# Patient Record
Sex: Female | Born: 1935 | Race: White | Hispanic: No | Smoking: Never smoker
Health system: Southern US, Community
[De-identification: ages and names within clinical notes are randomized; demographics above are authoritative.]

## PROBLEM LIST (undated history)

## (undated) DIAGNOSIS — F0390 Unspecified dementia without behavioral disturbance: Secondary | ICD-10-CM

## (undated) DIAGNOSIS — F32A Depression, unspecified: Secondary | ICD-10-CM

## (undated) DIAGNOSIS — G459 Transient cerebral ischemic attack, unspecified: Secondary | ICD-10-CM

## (undated) DIAGNOSIS — R1084 Generalized abdominal pain: Secondary | ICD-10-CM

## (undated) DIAGNOSIS — Z9221 Personal history of antineoplastic chemotherapy: Secondary | ICD-10-CM

## (undated) DIAGNOSIS — K222 Esophageal obstruction: Secondary | ICD-10-CM

## (undated) DIAGNOSIS — M1990 Unspecified osteoarthritis, unspecified site: Secondary | ICD-10-CM

## (undated) DIAGNOSIS — E039 Hypothyroidism, unspecified: Secondary | ICD-10-CM

## (undated) DIAGNOSIS — R600 Localized edema: Secondary | ICD-10-CM

## (undated) DIAGNOSIS — K5790 Diverticulosis of intestine, part unspecified, without perforation or abscess without bleeding: Secondary | ICD-10-CM

## (undated) DIAGNOSIS — IMO0001 Reserved for inherently not codable concepts without codable children: Secondary | ICD-10-CM

## (undated) DIAGNOSIS — C801 Malignant (primary) neoplasm, unspecified: Secondary | ICD-10-CM

## (undated) DIAGNOSIS — IMO0002 Reserved for concepts with insufficient information to code with codable children: Secondary | ICD-10-CM

## (undated) DIAGNOSIS — I10 Essential (primary) hypertension: Secondary | ICD-10-CM

## (undated) DIAGNOSIS — F329 Major depressive disorder, single episode, unspecified: Secondary | ICD-10-CM

## (undated) HISTORY — DX: Generalized abdominal pain: R10.84

## (undated) HISTORY — DX: Reserved for inherently not codable concepts without codable children: IMO0001

## (undated) HISTORY — DX: Transient cerebral ischemic attack, unspecified: G45.9

## (undated) HISTORY — DX: Personal history of antineoplastic chemotherapy: Z92.21

## (undated) HISTORY — DX: Esophageal obstruction: K22.2

## (undated) HISTORY — DX: Reserved for concepts with insufficient information to code with codable children: IMO0002

## (undated) HISTORY — DX: Depression, unspecified: F32.A

## (undated) HISTORY — DX: Diverticulosis of intestine, part unspecified, without perforation or abscess without bleeding: K57.90

## (undated) HISTORY — DX: Unspecified osteoarthritis, unspecified site: M19.90

## (undated) HISTORY — DX: Essential (primary) hypertension: I10

## (undated) HISTORY — DX: Malignant (primary) neoplasm, unspecified: C80.1

## (undated) HISTORY — PX: TUBAL LIGATION: SHX77

## (undated) HISTORY — PX: CATARACT EXTRACTION, BILATERAL: SHX1313

## (undated) HISTORY — DX: Major depressive disorder, single episode, unspecified: F32.9

## (undated) HISTORY — DX: Hypothyroidism, unspecified: E03.9

## (undated) HISTORY — DX: Localized edema: R60.0

---

## 1984-07-16 HISTORY — PX: TOTAL ABDOMINAL HYSTERECTOMY W/ BILATERAL SALPINGOOPHORECTOMY: SHX83

## 2000-11-29 ENCOUNTER — Other Ambulatory Visit: Admission: RE | Admit: 2000-11-29 | Discharge: 2000-11-29 | Payer: Self-pay | Admitting: Family Medicine

## 2000-12-05 ENCOUNTER — Ambulatory Visit (HOSPITAL_COMMUNITY): Admission: RE | Admit: 2000-12-05 | Discharge: 2000-12-05 | Payer: Self-pay | Admitting: Family Medicine

## 2000-12-05 ENCOUNTER — Encounter: Payer: Self-pay | Admitting: Family Medicine

## 2001-09-06 ENCOUNTER — Emergency Department (HOSPITAL_COMMUNITY): Admission: EM | Admit: 2001-09-06 | Discharge: 2001-09-06 | Payer: Self-pay | Admitting: *Deleted

## 2001-12-23 ENCOUNTER — Ambulatory Visit (HOSPITAL_COMMUNITY): Admission: RE | Admit: 2001-12-23 | Discharge: 2001-12-23 | Payer: Self-pay | Admitting: Family Medicine

## 2001-12-23 ENCOUNTER — Encounter: Payer: Self-pay | Admitting: Family Medicine

## 2002-07-25 ENCOUNTER — Encounter: Payer: Self-pay | Admitting: Emergency Medicine

## 2002-07-25 ENCOUNTER — Emergency Department (HOSPITAL_COMMUNITY): Admission: EM | Admit: 2002-07-25 | Discharge: 2002-07-25 | Payer: Self-pay | Admitting: Emergency Medicine

## 2002-07-28 ENCOUNTER — Encounter: Payer: Self-pay | Admitting: *Deleted

## 2002-07-28 ENCOUNTER — Emergency Department (HOSPITAL_COMMUNITY): Admission: EM | Admit: 2002-07-28 | Discharge: 2002-07-28 | Payer: Self-pay | Admitting: *Deleted

## 2002-08-09 ENCOUNTER — Encounter: Payer: Self-pay | Admitting: *Deleted

## 2002-08-09 ENCOUNTER — Emergency Department (HOSPITAL_COMMUNITY): Admission: EM | Admit: 2002-08-09 | Discharge: 2002-08-09 | Payer: Self-pay | Admitting: *Deleted

## 2002-10-06 ENCOUNTER — Emergency Department (HOSPITAL_COMMUNITY): Admission: EM | Admit: 2002-10-06 | Discharge: 2002-10-06 | Payer: Self-pay | Admitting: Emergency Medicine

## 2003-01-26 ENCOUNTER — Ambulatory Visit (HOSPITAL_COMMUNITY): Admission: RE | Admit: 2003-01-26 | Discharge: 2003-01-26 | Payer: Self-pay | Admitting: Family Medicine

## 2003-01-26 ENCOUNTER — Encounter: Payer: Self-pay | Admitting: Family Medicine

## 2003-03-07 ENCOUNTER — Emergency Department (HOSPITAL_COMMUNITY): Admission: EM | Admit: 2003-03-07 | Discharge: 2003-03-07 | Payer: Self-pay | Admitting: Emergency Medicine

## 2004-01-08 ENCOUNTER — Emergency Department (HOSPITAL_COMMUNITY): Admission: EM | Admit: 2004-01-08 | Discharge: 2004-01-08 | Payer: Self-pay | Admitting: Emergency Medicine

## 2004-04-30 ENCOUNTER — Emergency Department (HOSPITAL_COMMUNITY): Admission: EM | Admit: 2004-04-30 | Discharge: 2004-04-30 | Payer: Self-pay | Admitting: Emergency Medicine

## 2004-07-03 ENCOUNTER — Emergency Department (HOSPITAL_COMMUNITY): Admission: EM | Admit: 2004-07-03 | Discharge: 2004-07-04 | Payer: Self-pay | Admitting: *Deleted

## 2004-12-30 ENCOUNTER — Emergency Department (HOSPITAL_COMMUNITY): Admission: EM | Admit: 2004-12-30 | Discharge: 2004-12-30 | Payer: Self-pay | Admitting: Emergency Medicine

## 2005-01-18 ENCOUNTER — Ambulatory Visit (HOSPITAL_COMMUNITY): Admission: RE | Admit: 2005-01-18 | Discharge: 2005-01-18 | Payer: Self-pay | Admitting: Family Medicine

## 2005-02-13 DIAGNOSIS — K5790 Diverticulosis of intestine, part unspecified, without perforation or abscess without bleeding: Secondary | ICD-10-CM

## 2005-02-13 HISTORY — DX: Diverticulosis of intestine, part unspecified, without perforation or abscess without bleeding: K57.90

## 2005-03-02 IMAGING — CT CT PELVIS W/ CM
1 of 3 series · 14 of 32 positions shown, 20 images · IV contrast (agent unspecified)
Comparison: none

CLINICAL DATA: Left lower abdominal pain. Previous hysterectomy.

CT abdomen with contrast:
Multidetector helical CT after 100 ml [SA] IV.
No previous for comparison. Visualized lung bases clear. Unremarkable liver,
gallbladder, spleen, adrenal glands, kidneys, pancreas, aorta, small bowel. No
free air. No ascites. Degenerative changes in the lumbar spine.

[Series 7473: — · axial · 0.73mm/px · z∈[+1366,+1746]mm · 14 of 88 slices shown, 20 images]
[im 6/88  soft-tissue]
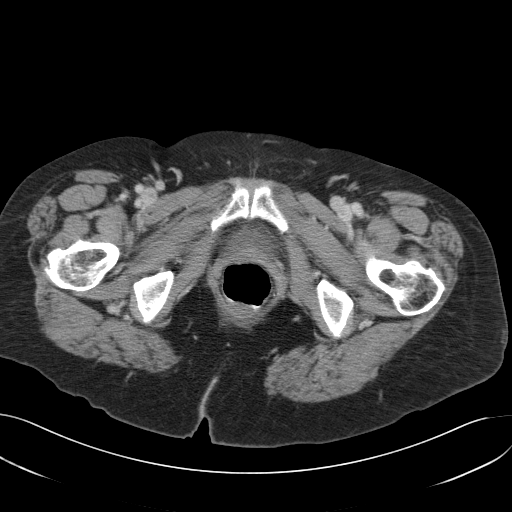
[im 6/88  bone]
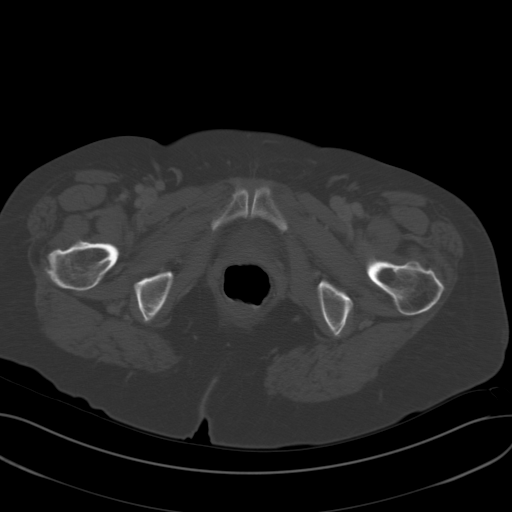
[im 11/88  soft-tissue]
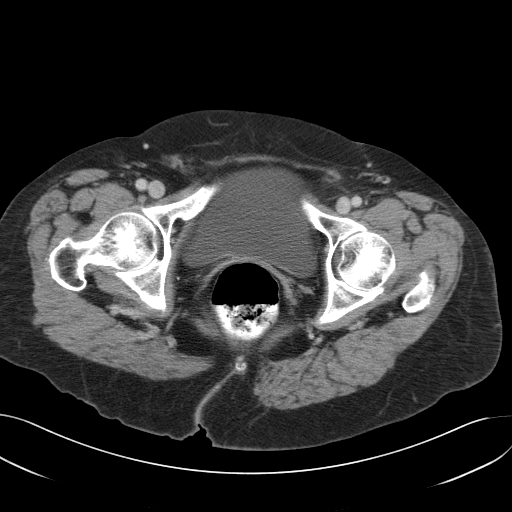
[im 16/88  soft-tissue]
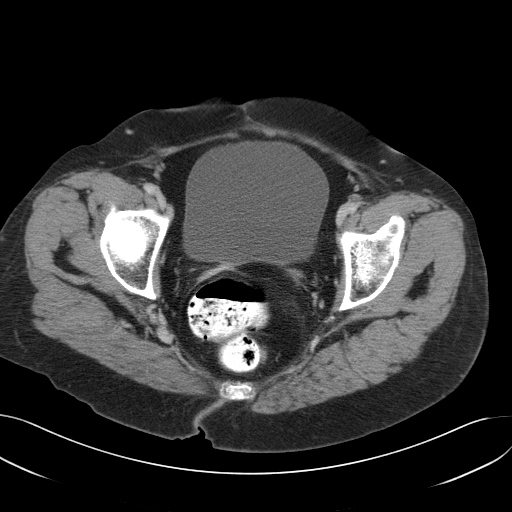
[im 26/88  soft-tissue]
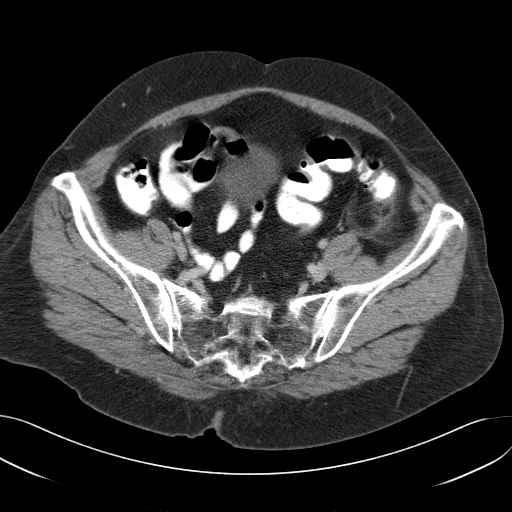
[im 31/88  soft-tissue]
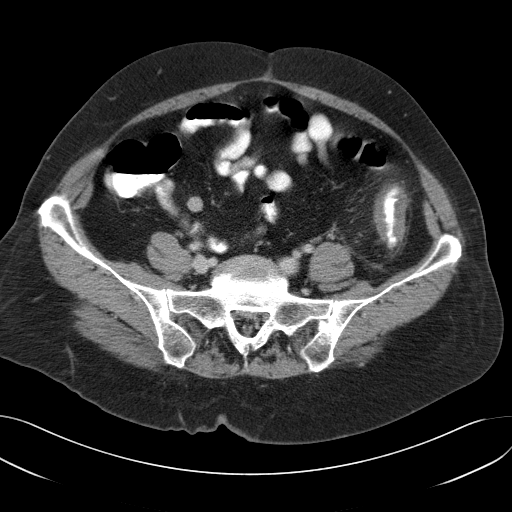
[im 36/88  soft-tissue]
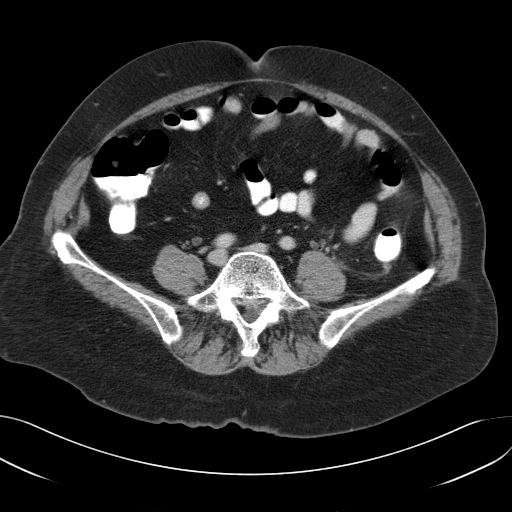
[im 41/88  soft-tissue]
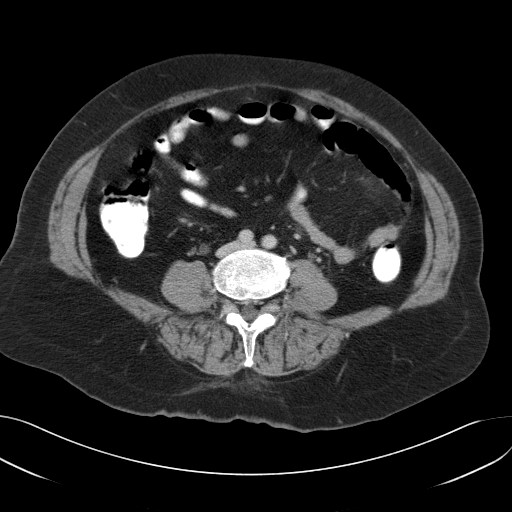
[im 47/88  soft-tissue]
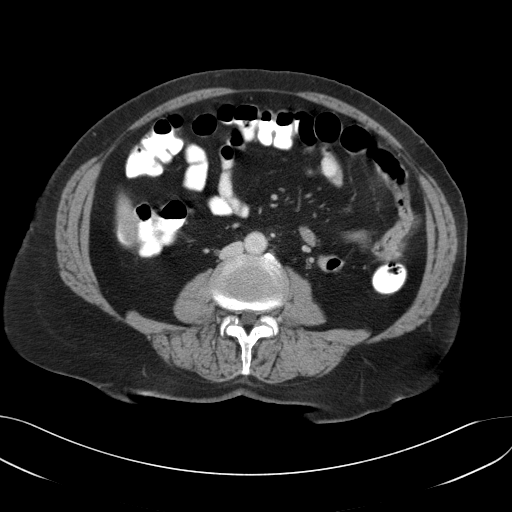
[im 52/88  soft-tissue]
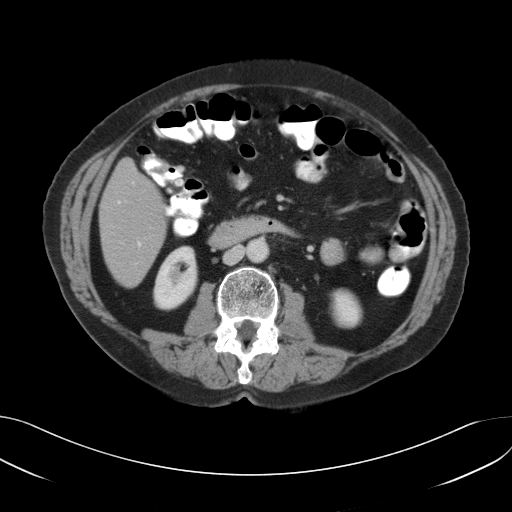
[im 52/88  bone]
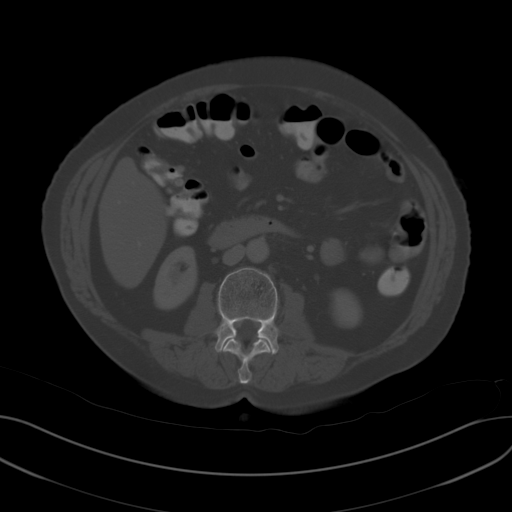
[im 57/88  soft-tissue]
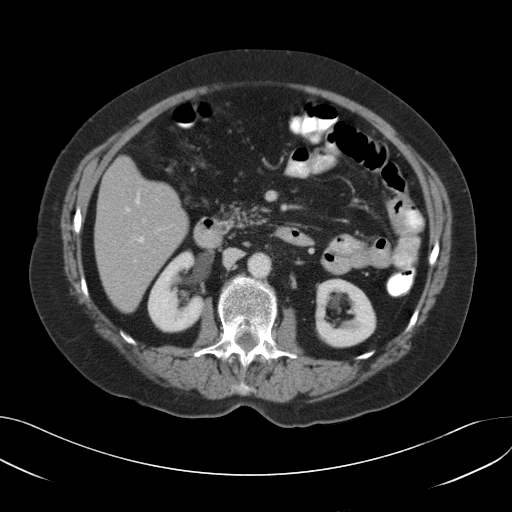
[im 67/88  soft-tissue]
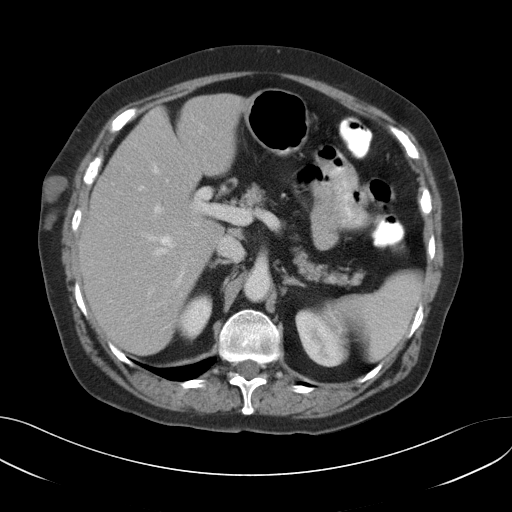
[im 67/88  lung]
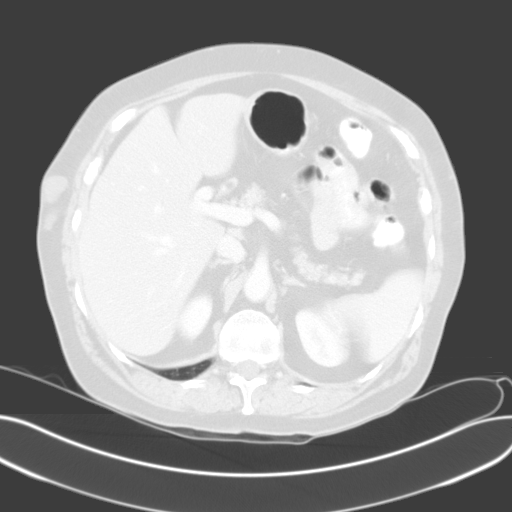
[im 72/88  soft-tissue]
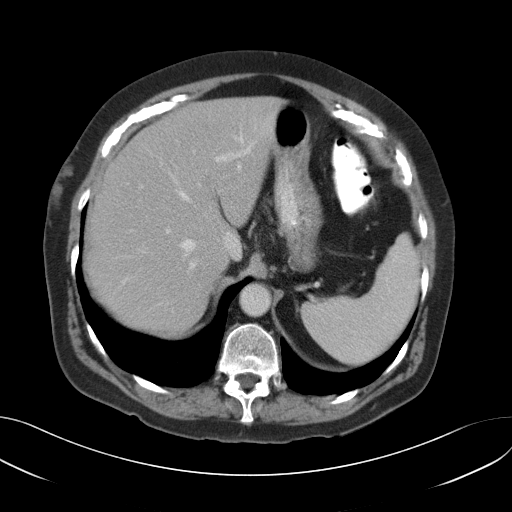
[im 72/88  lung]
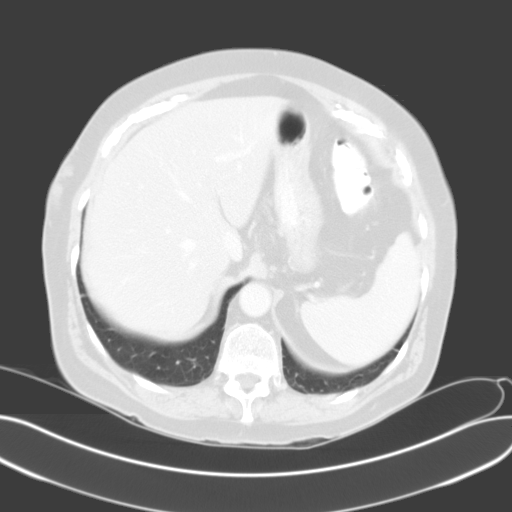
[im 77/88  soft-tissue]
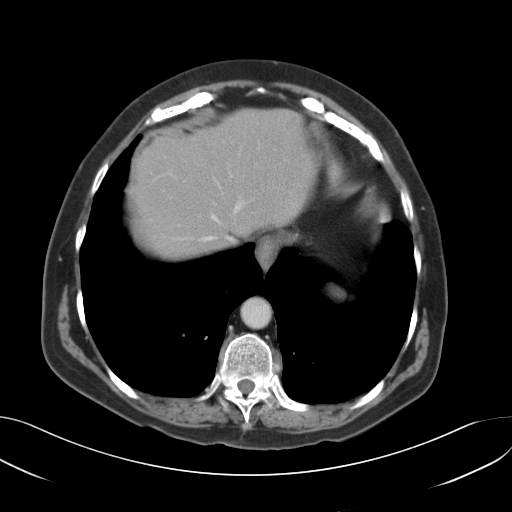
[im 77/88  lung]
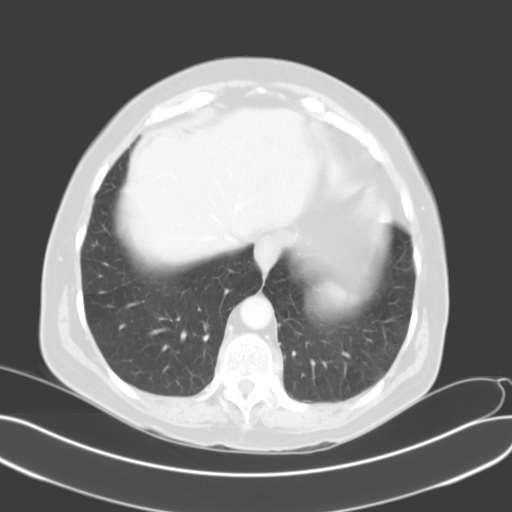
[im 82/88  soft-tissue]
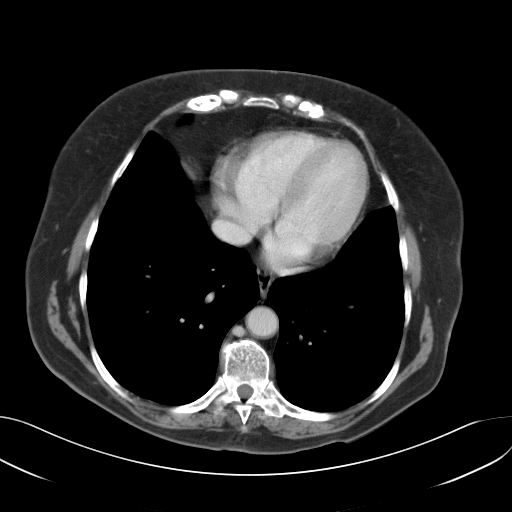
[im 82/88  lung]
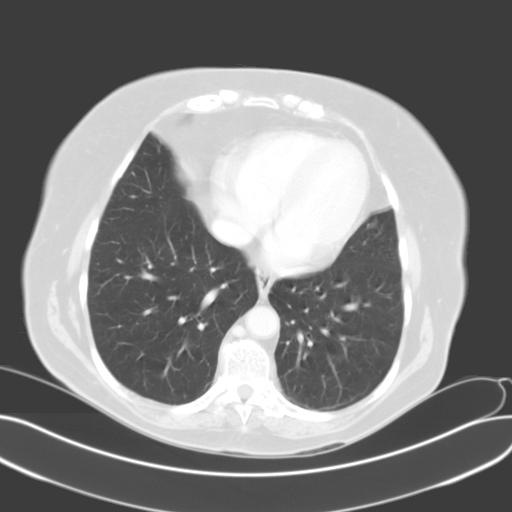

[14 of 32 positions shown; findings below may reference images not displayed]

IMPRESSION: 1. Unremarkable CT abdomen

CT pelvis with contrast: 

Streaky inflammatory/edematous changes around the distal descending colon.
Scattered diverticula are noted in the descending and sigmoid portion of the
colon. No extraluminal fluid collection or gas. No free fluid. Urinary bladder
physiologically distended. Appendix not seen.
IMPRESSION: 1. Probable distal descending colon diverticulitis without evidence of abscess.

## 2005-03-03 ENCOUNTER — Inpatient Hospital Stay (HOSPITAL_COMMUNITY): Admission: EM | Admit: 2005-03-03 | Discharge: 2005-03-06 | Payer: Self-pay | Admitting: Emergency Medicine

## 2005-03-03 ENCOUNTER — Ambulatory Visit: Payer: Self-pay | Admitting: Internal Medicine

## 2005-03-27 ENCOUNTER — Ambulatory Visit (HOSPITAL_COMMUNITY): Admission: RE | Admit: 2005-03-27 | Discharge: 2005-03-27 | Payer: Self-pay | Admitting: Otolaryngology

## 2005-03-27 ENCOUNTER — Encounter (INDEPENDENT_AMBULATORY_CARE_PROVIDER_SITE_OTHER): Payer: Self-pay | Admitting: *Deleted

## 2005-03-27 HISTORY — PX: DIRECT LARYNGOSCOPY: SHX5326

## 2005-04-03 ENCOUNTER — Ambulatory Visit (HOSPITAL_COMMUNITY): Admission: RE | Admit: 2005-04-03 | Discharge: 2005-04-03 | Payer: Self-pay | Admitting: Otolaryngology

## 2005-04-03 IMAGING — CT CT NECK W/ CM
1 series · 12 of 14 positions shown, 15 images · IV contrast (omnipaque)
Comparison: [REDACTED] two view chest x-ray [DATE].

CLINICAL DATA: Staging supraglottic cancer.  Throat biopsy [DATE].  Painful and difficulty swallowing for two to three months.  Hypertension. 
NECK CT WITH CONTRAST:
TECHNIQUE: Multidetector CT imaging of the neck was performed following the standard protocol during administration of intravenous contrast.
Contrast:  100 cc Omnipaque 300

[Series 284: — · axial · 0.39mm/px · z∈[-855,-660]mm · 12 of 73 slices shown, 15 images]
[im 6/73  soft-tissue]
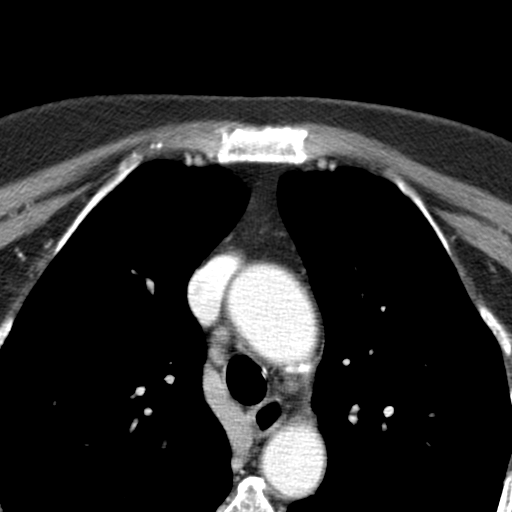
[im 6/73  bone]
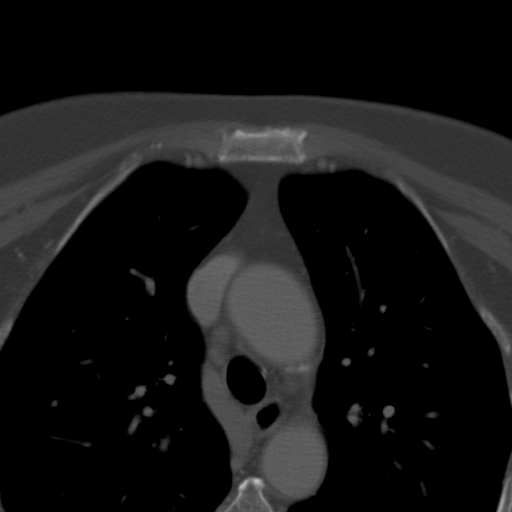
[im 12/73  bone]
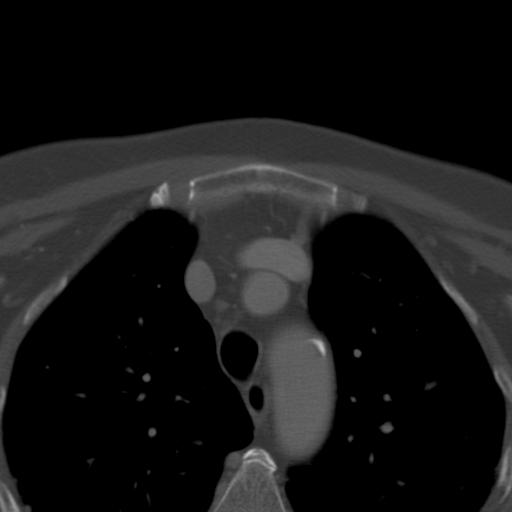
[im 17/73  bone]
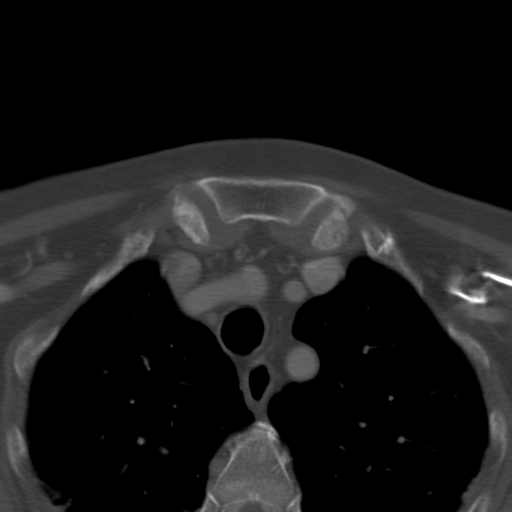
[im 23/73  bone]
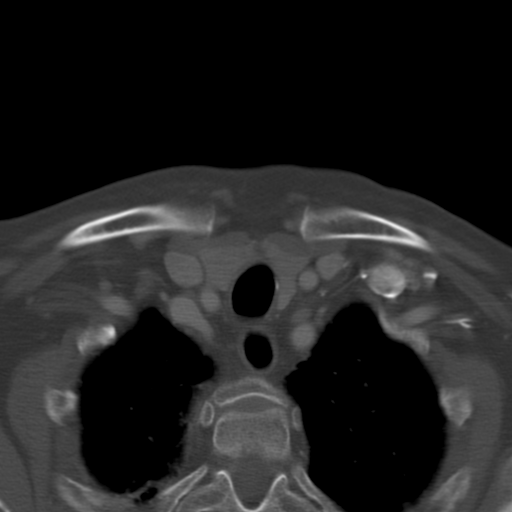
[im 28/73  soft-tissue]
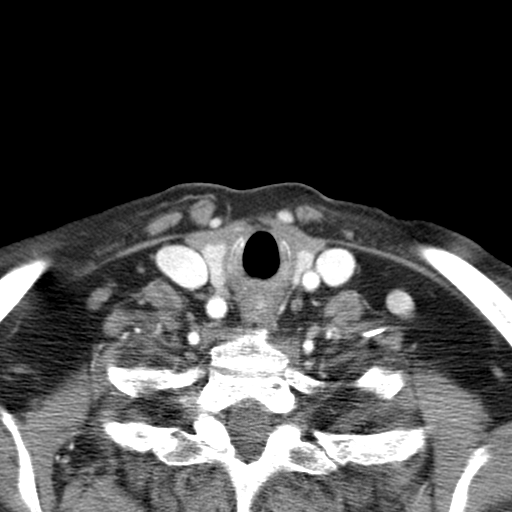
[im 28/73  bone]
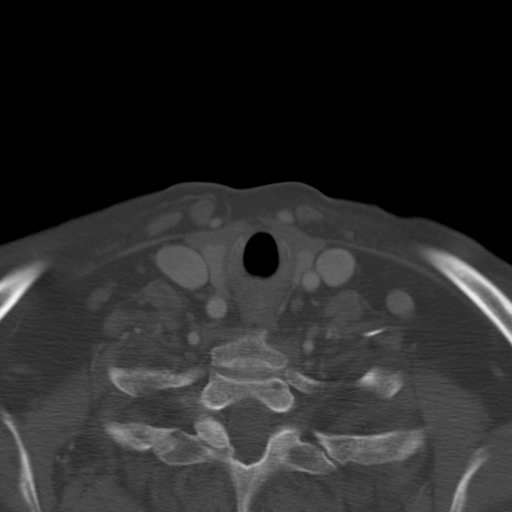
[im 34/73  bone]
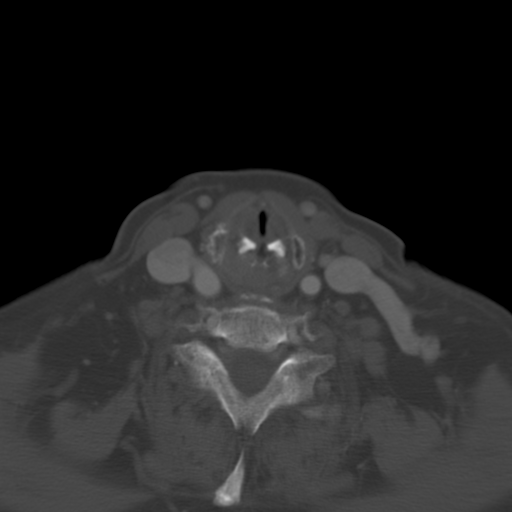
[im 39/73  bone]
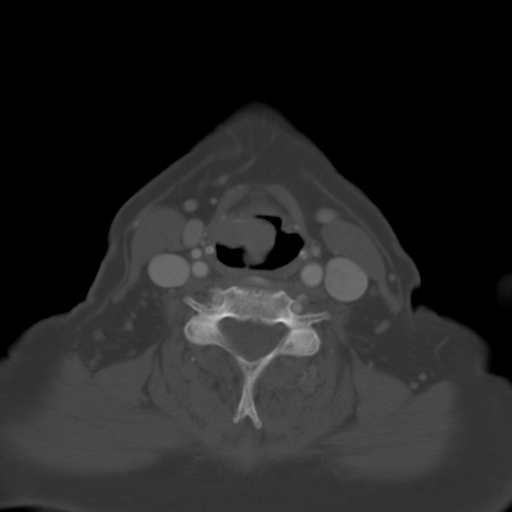
[im 45/73  bone]
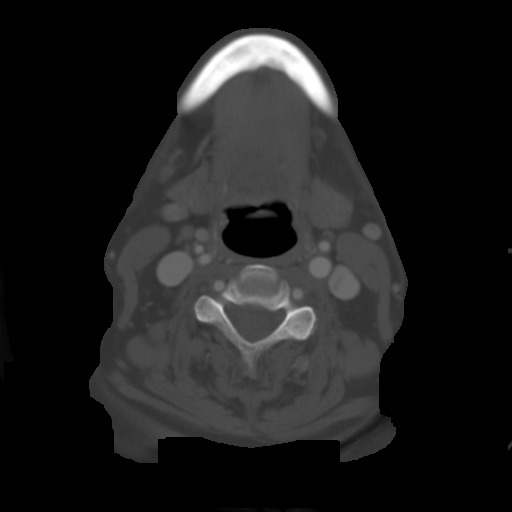
[im 50/73  soft-tissue]
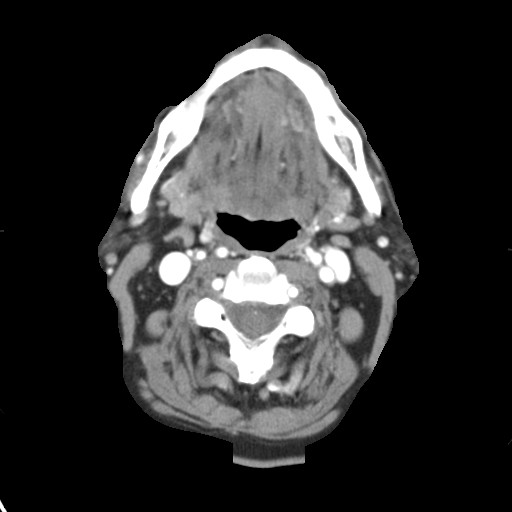
[im 50/73  bone]
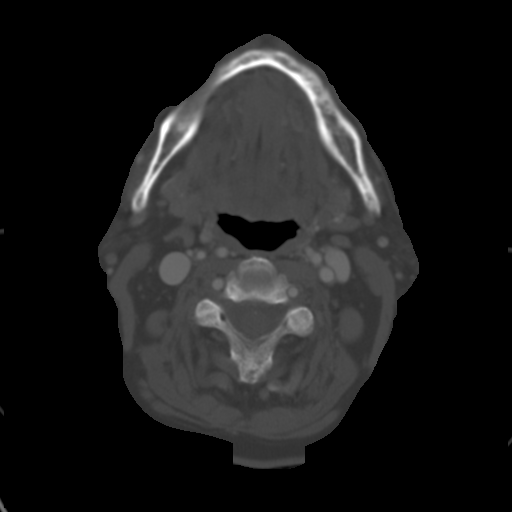
[im 56/73  bone]
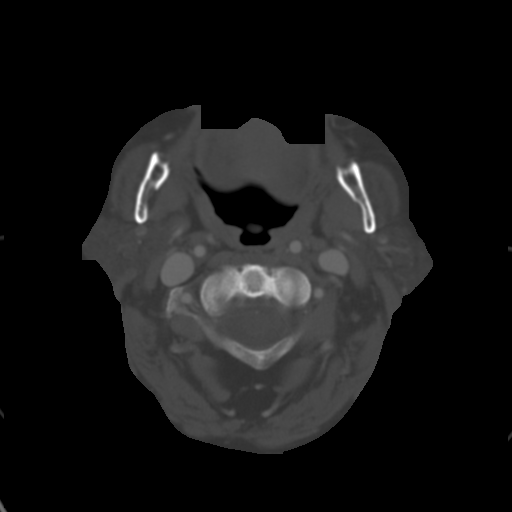
[im 61/73  bone]
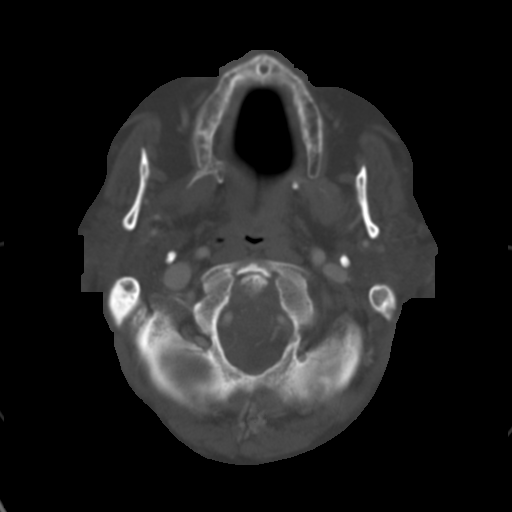
[im 67/73  bone]
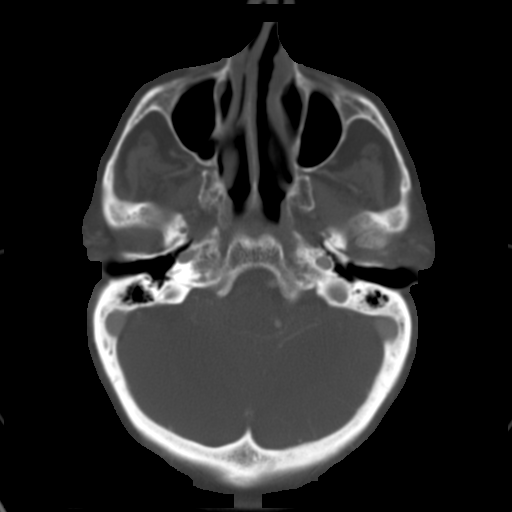

[12 of 14 positions shown; findings below may reference images not displayed]

FINDINGS: Abnormal soft tissue mass is seen from the level of the right false vocal cord continuing along the right aryepiglottic fold measuring approximately 1.5 cm AP x 2.7 cm wide (image 37).  No right paralaryngeal nor preepiglottic space invasion is seen.  The true vocal cords appear normal with no subglottic spread of tumor.  No metastatic cervical adenopathy is seen.  Naso-oro-remaining hypopharynx, thyroid gland, salivary glands appear normal.  Developmental decreased left greater than right mastoid pneumatization is seen with no significant osseous abnormality.  No change in slight right apical fibrosis with the lung apices otherwise clear and slight indeterminate superior mediastinal lymphadenopathy with the largest right paratracheal lymph node measuring 1.3 cm AP x 0.9 cm wide (image 69) which is nonspecific.  No other significant abnormality is seen.
IMPRESSION: 1.  Tumor at the right false vocal cord/aryepiglottic fold with no other direct invasion nor metastatic disease seen.  
2.  Slight superior mediastinal adenopathy. 
3.  Otherwise no significant abnormality.

## 2005-04-16 ENCOUNTER — Ambulatory Visit: Payer: Self-pay | Admitting: Dentistry

## 2005-04-16 ENCOUNTER — Encounter: Admission: RE | Admit: 2005-04-16 | Discharge: 2005-04-16 | Payer: Self-pay | Admitting: Dentistry

## 2005-04-23 ENCOUNTER — Ambulatory Visit: Admission: RE | Admit: 2005-04-23 | Discharge: 2005-07-10 | Payer: Self-pay | Admitting: Radiation Oncology

## 2005-07-14 ENCOUNTER — Observation Stay (HOSPITAL_COMMUNITY): Admission: EM | Admit: 2005-07-14 | Discharge: 2005-07-16 | Payer: Self-pay | Admitting: Emergency Medicine

## 2005-08-06 ENCOUNTER — Ambulatory Visit: Payer: Self-pay | Admitting: Dentistry

## 2005-10-10 ENCOUNTER — Ambulatory Visit: Payer: Self-pay | Admitting: Dentistry

## 2005-10-23 IMAGING — CR DG CHEST 2V
2 series · 2 of 2 positions shown · non-contrast
Comparison: None.

CLINICAL DATA: Sore throat, productive cough for six weeks
 CHEST ? TWO VIEWS:

[view not recorded (1 of 2)]
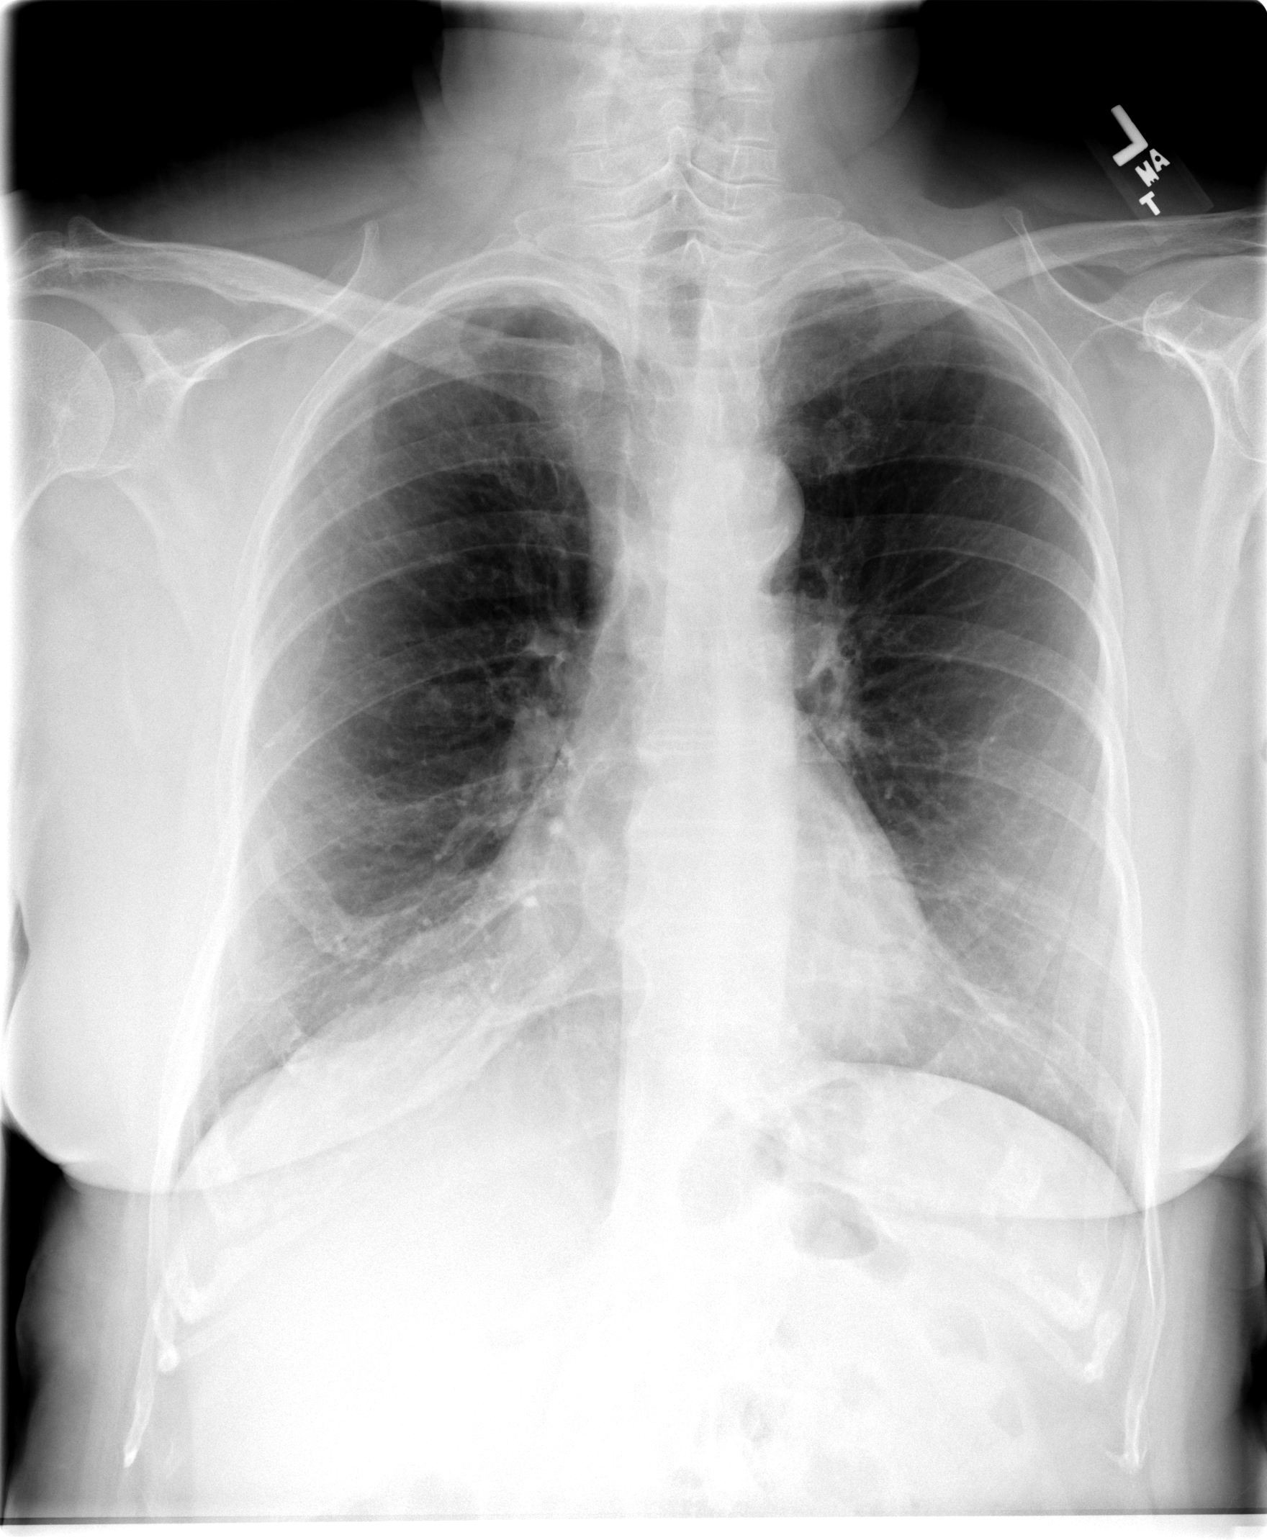

[view not recorded (2 of 2)]
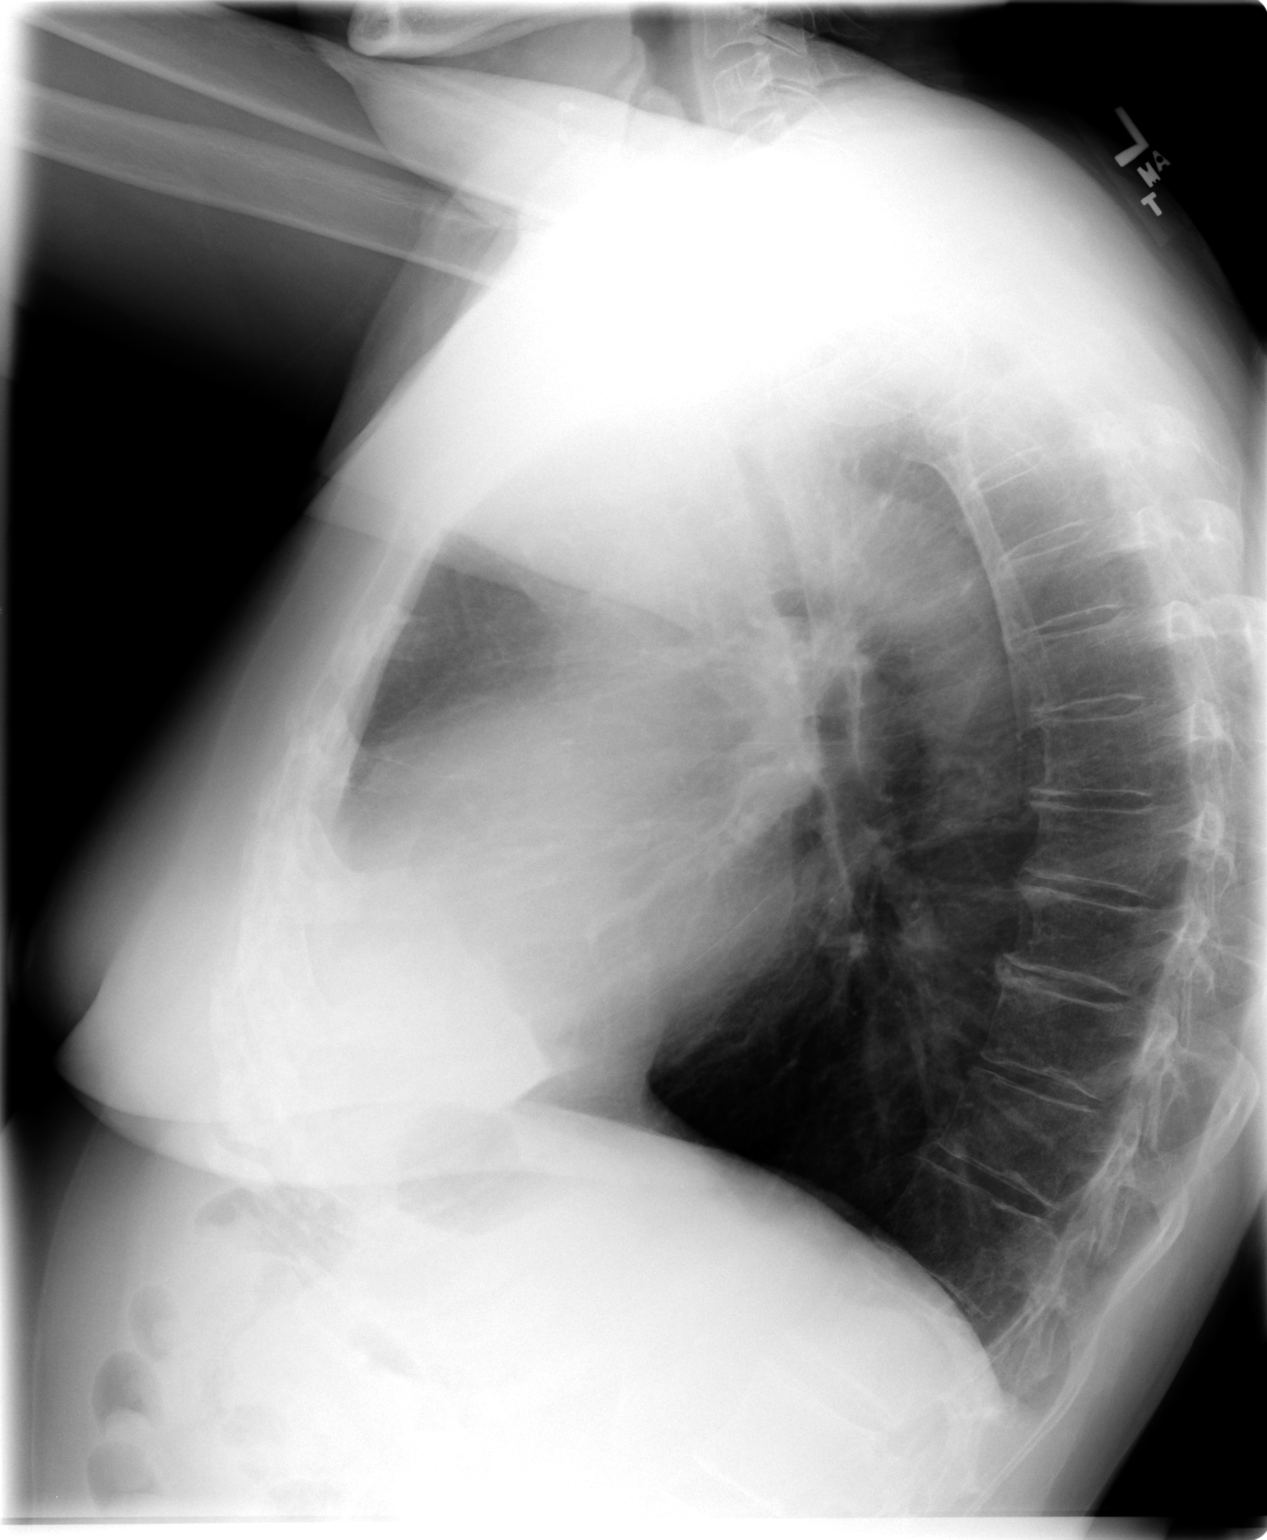

[2 of 2 positions shown; findings below may reference images not displayed]

FINDINGS: The heart is normal.  
 No pleural effusions.  
 No focal lung opacities are identified.
IMPRESSION: No active cardiopulmonary abnormalities.

## 2005-11-21 ENCOUNTER — Ambulatory Visit: Payer: Self-pay | Admitting: Dentistry

## 2005-11-26 ENCOUNTER — Ambulatory Visit: Payer: Self-pay | Admitting: Dentistry

## 2005-12-14 ENCOUNTER — Ambulatory Visit (HOSPITAL_COMMUNITY): Admission: RE | Admit: 2005-12-14 | Discharge: 2005-12-14 | Payer: Self-pay | Admitting: Family Medicine

## 2005-12-14 IMAGING — CR DG CERVICAL SPINE COMPLETE 4+V
6 series · 6 of 6 positions shown · non-contrast
Comparison: none

HISTORY: Posterior neck pain, throat cancer

[view not recorded (1 of 6)]
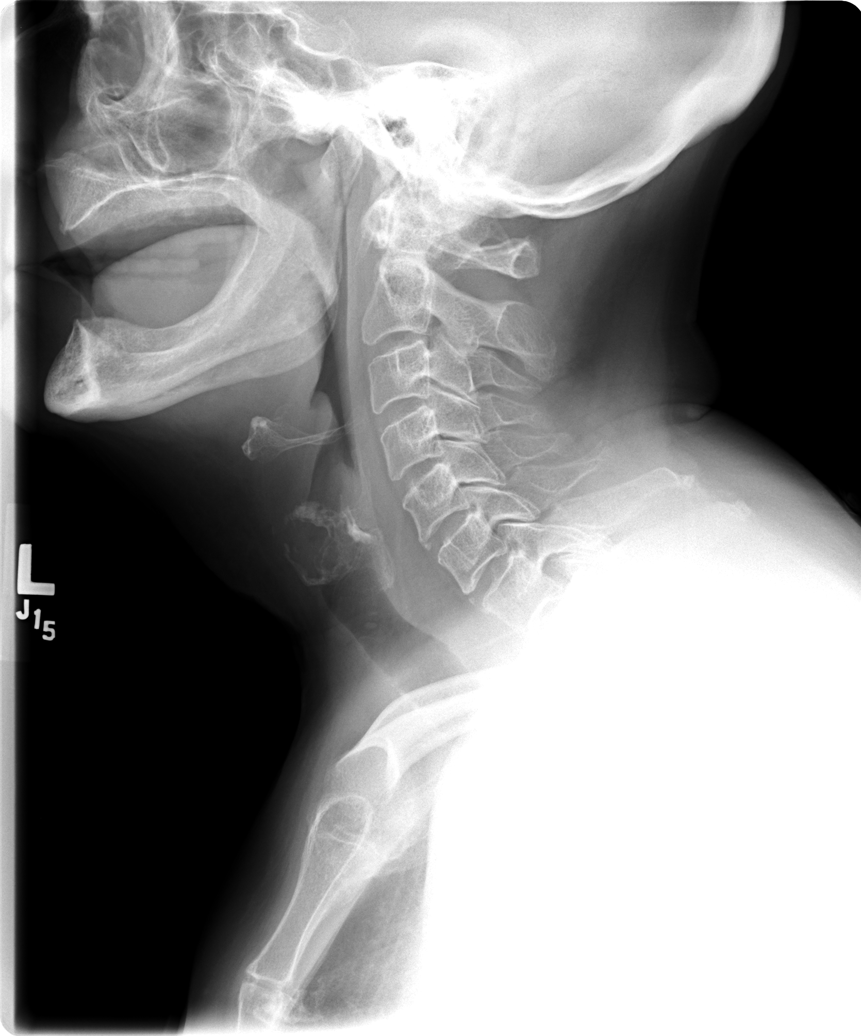

[view not recorded (2 of 6)]
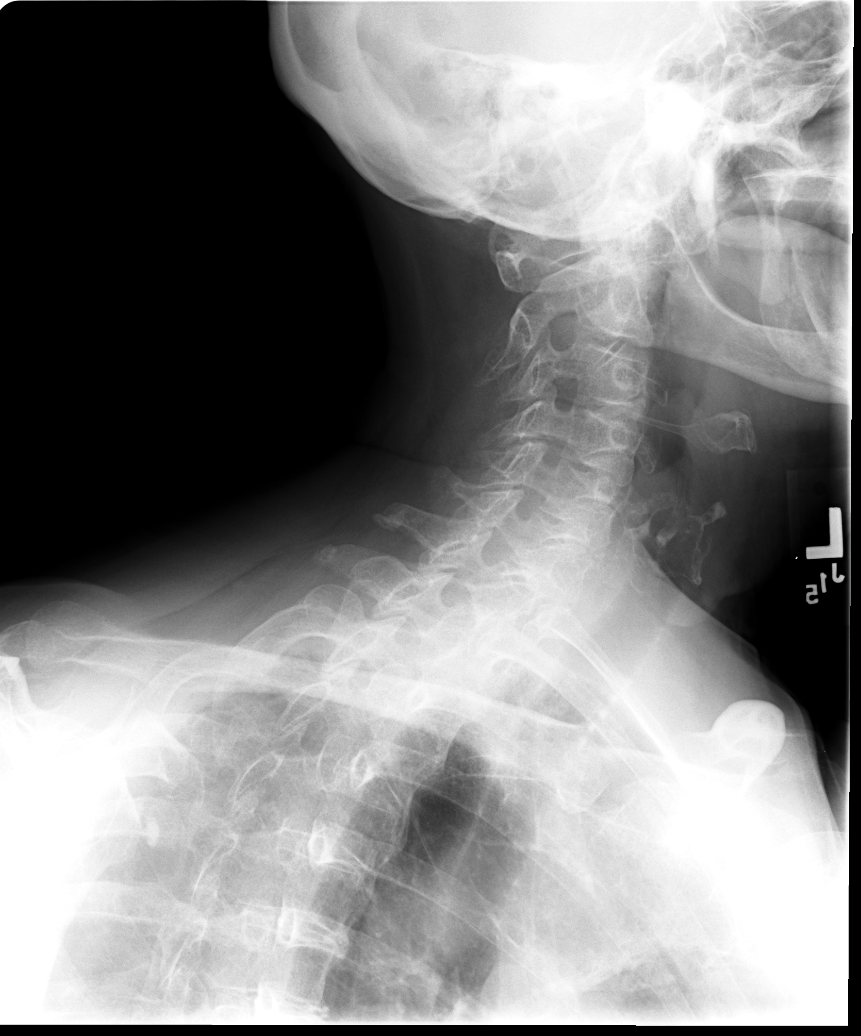

[view not recorded (3 of 6)]
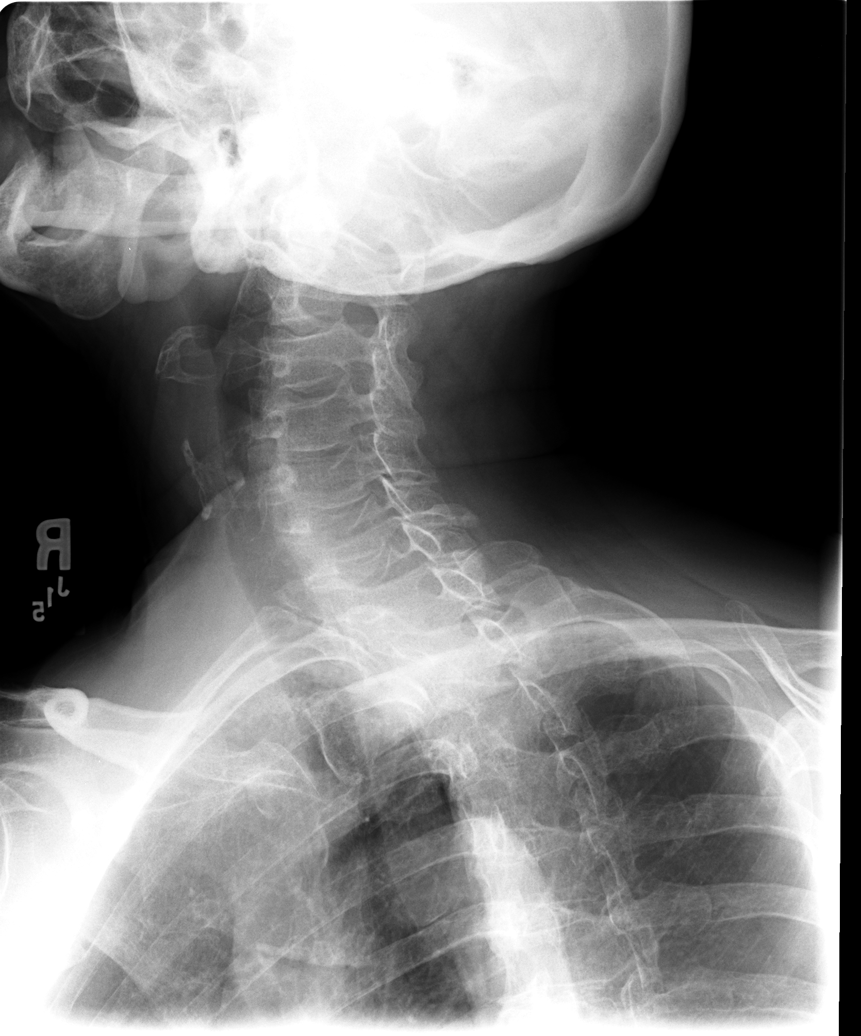

[view not recorded (4 of 6)]
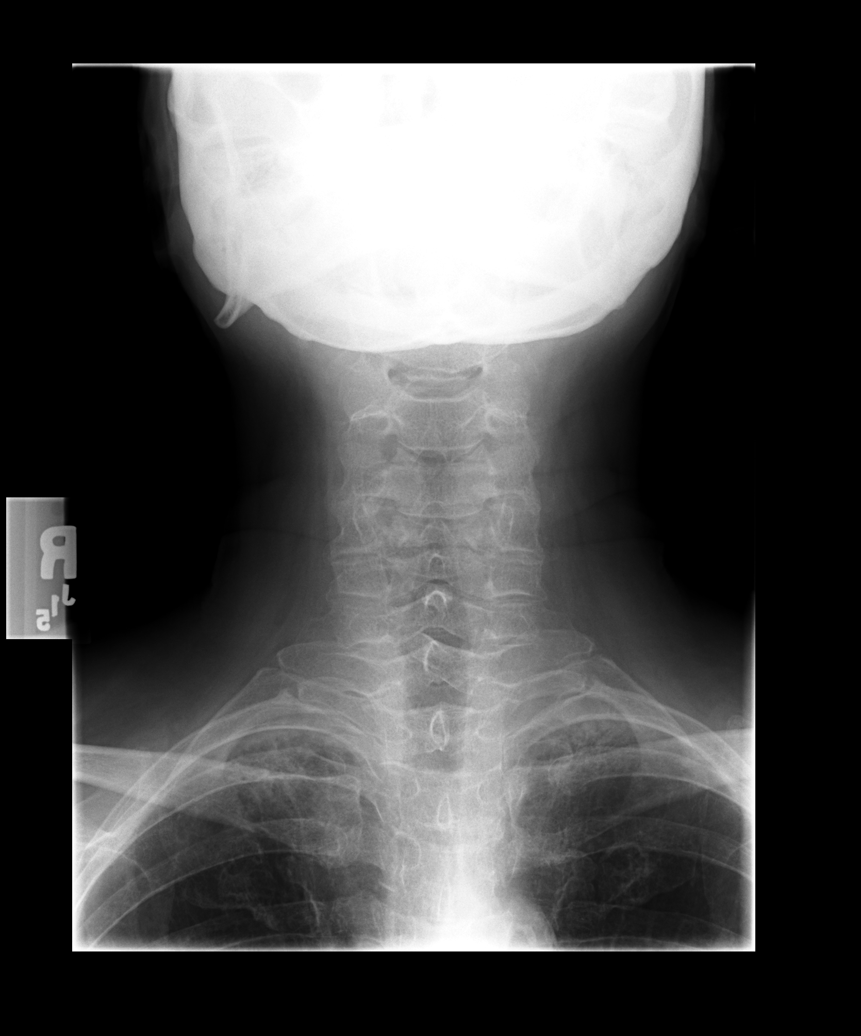

[view not recorded (5 of 6)]
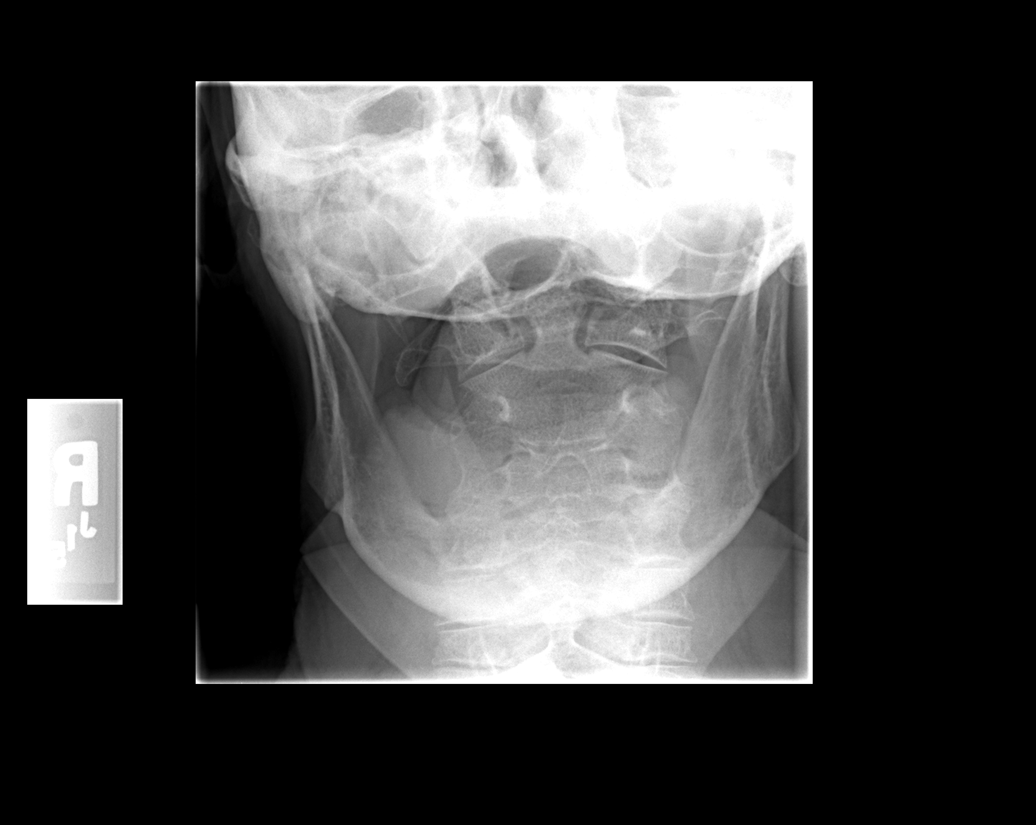

[view not recorded (6 of 6)]
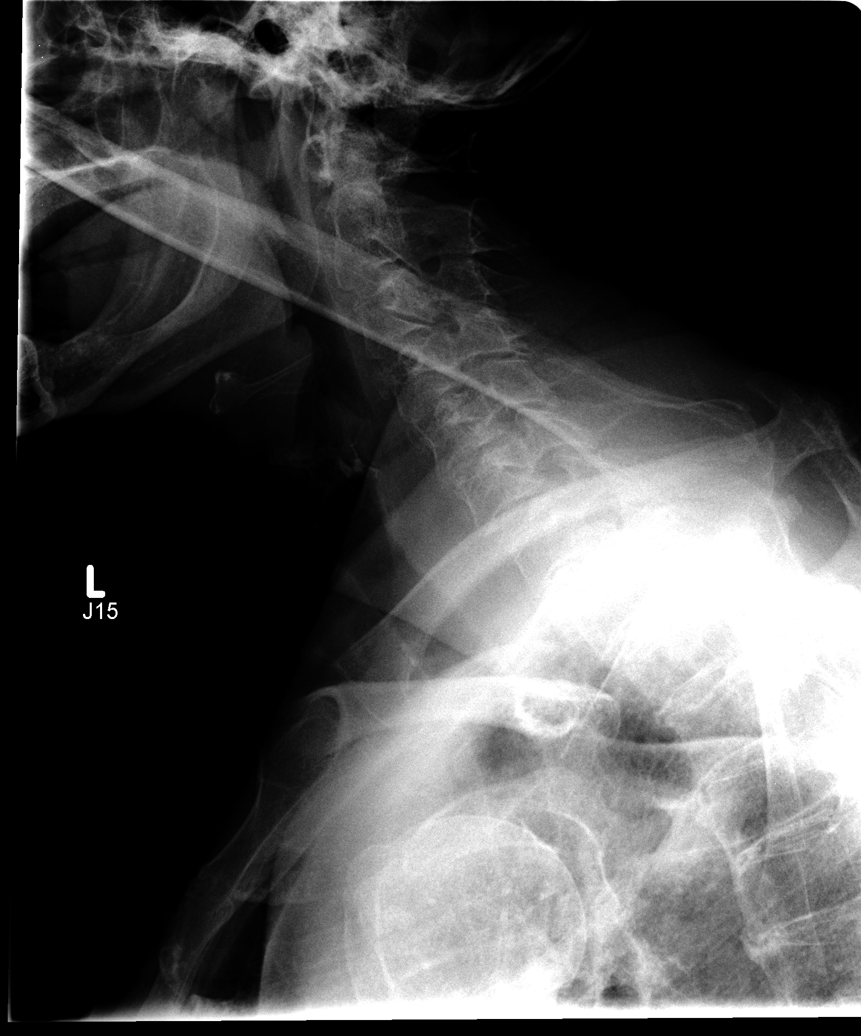

[6 of 6 positions shown; findings below may reference images not displayed]

CHEST 2 VIEWS:

Comparison [DATE]

Normal heart size, mediastinal contours, and pulmonary vascularity.
Biapical scarring significantly progressive since previous exam, suspect related
to radiation therapy portal.
Changes of bronchitis and COPD without infiltrate or effusion.
Scattered costal cartilaginous calcification without definite pulmonary mass.
Question right nipple shadow.
Osteoporosis.
IMPRESSION: COPD and bronchitic changes with progressive biapical scarring suspect related
to radiation therapy portal.
Question right nipple shadow.
Repeat PA chest radiograph with nipple markers recommended.

CERVICAL SPINE 5 VIEWS:

Bony demineralization.
Patient edentulous.
Anatomic alignment without fracture or subluxation.
Vertebral body and disc space heights maintained.
Prevertebral soft tissues upper normal thickness at C6 and C7.
Bony foramina patent.
C1-C2 alignment normal.
IMPRESSION: Osteoporosis without acute bony abnormality.

## 2005-12-14 IMAGING — CR DG CHEST 2V
2 series · 2 of 2 positions shown · non-contrast
Comparison: none

HISTORY: Posterior neck pain, throat cancer

[view not recorded (1 of 2)]
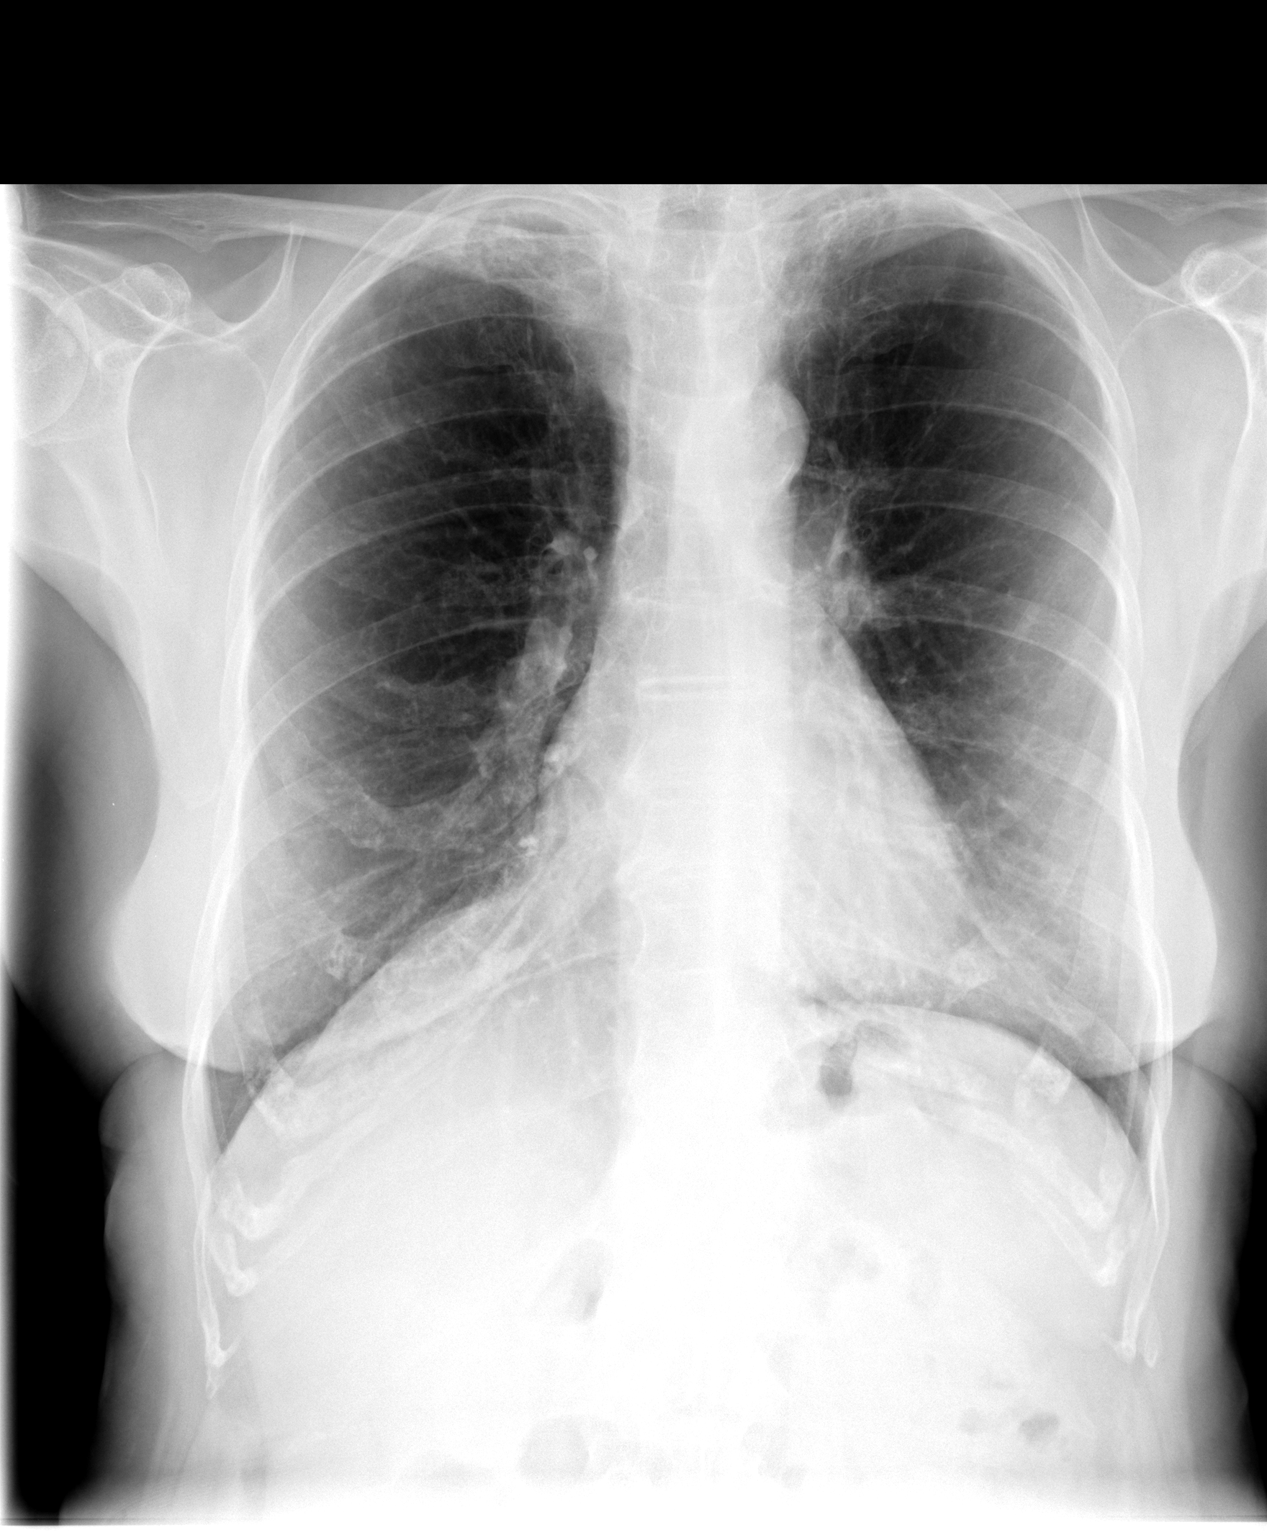

[view not recorded (2 of 2)]
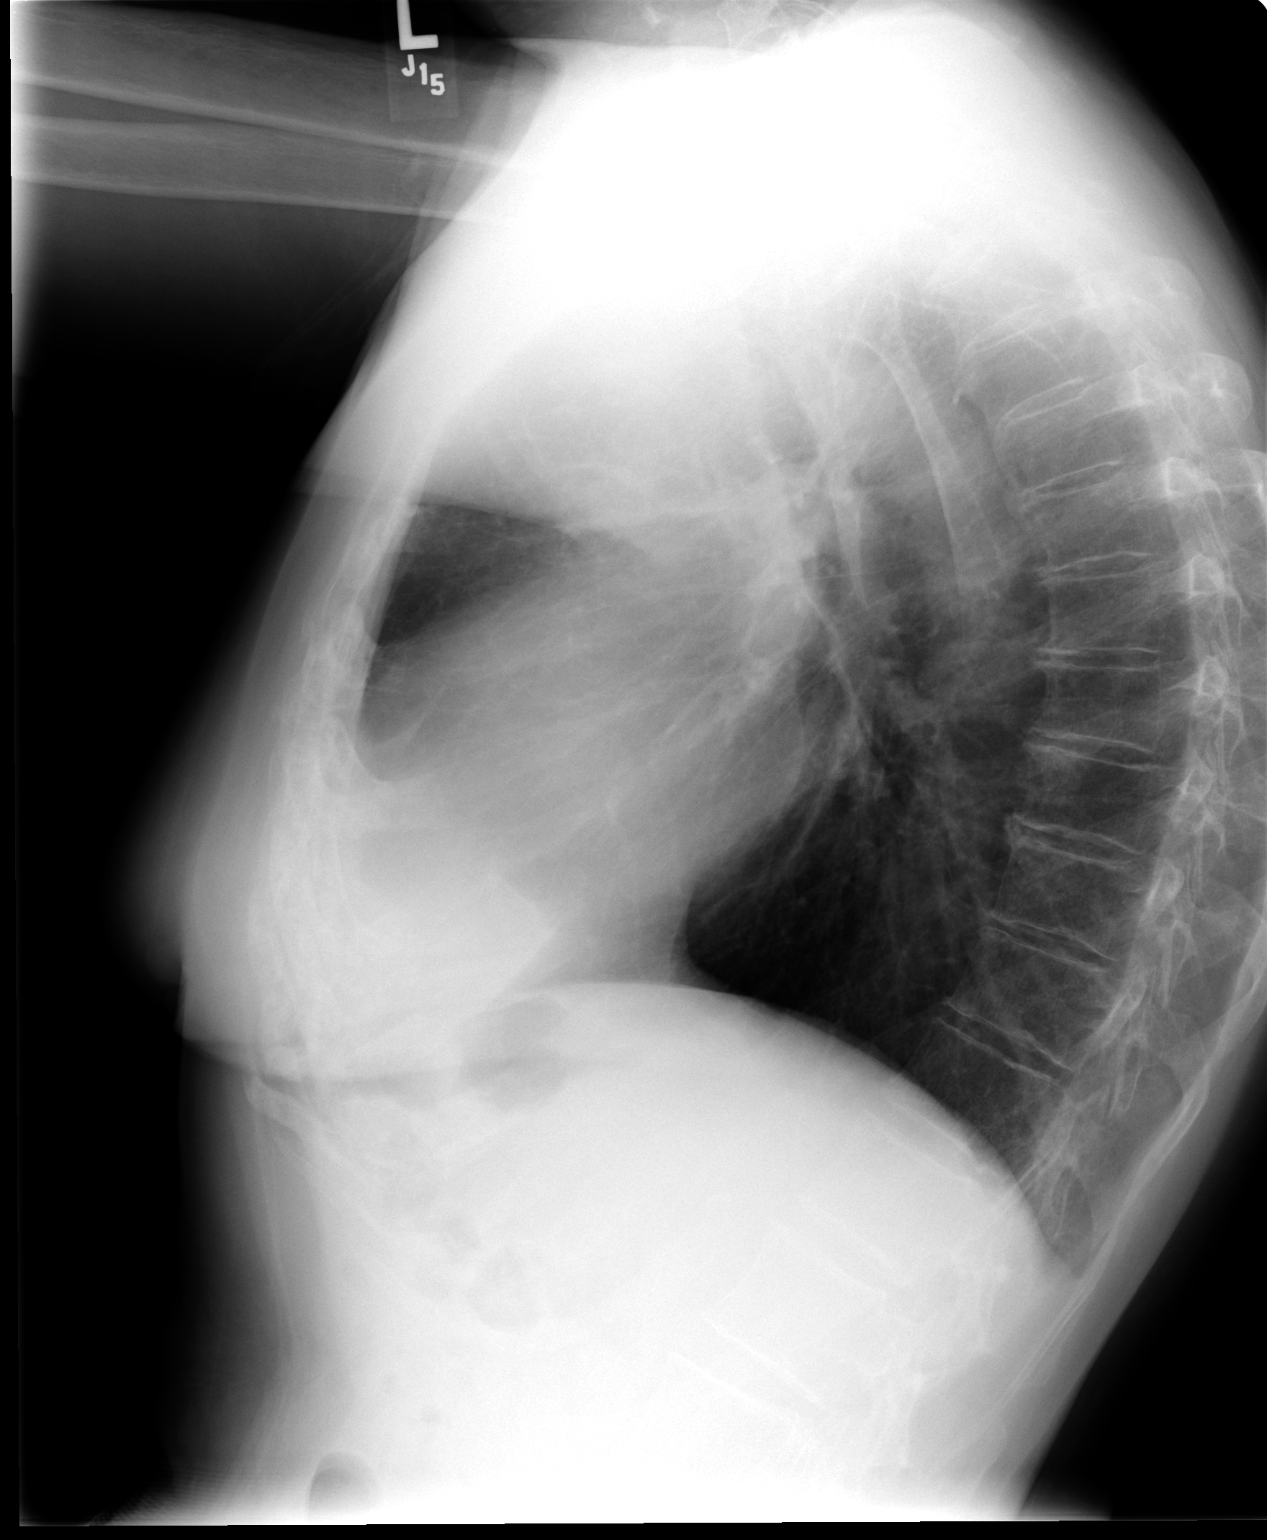

[2 of 2 positions shown; findings below may reference images not displayed]

CHEST 2 VIEWS:

Comparison [DATE]

Normal heart size, mediastinal contours, and pulmonary vascularity.
Biapical scarring significantly progressive since previous exam, suspect related
to radiation therapy portal.
Changes of bronchitis and COPD without infiltrate or effusion.
Scattered costal cartilaginous calcification without definite pulmonary mass.
Question right nipple shadow.
Osteoporosis.
IMPRESSION: COPD and bronchitic changes with progressive biapical scarring suspect related
to radiation therapy portal.
Question right nipple shadow.
Repeat PA chest radiograph with nipple markers recommended.

CERVICAL SPINE 5 VIEWS:

Bony demineralization.
Patient edentulous.
Anatomic alignment without fracture or subluxation.
Vertebral body and disc space heights maintained.
Prevertebral soft tissues upper normal thickness at C6 and C7.
Bony foramina patent.
C1-C2 alignment normal.
IMPRESSION: Osteoporosis without acute bony abnormality.

## 2006-02-19 ENCOUNTER — Ambulatory Visit: Payer: Self-pay | Admitting: Dentistry

## 2006-09-03 ENCOUNTER — Ambulatory Visit: Payer: Self-pay | Admitting: Dentistry

## 2007-03-19 ENCOUNTER — Ambulatory Visit: Payer: Self-pay | Admitting: Dentistry

## 2007-06-18 ENCOUNTER — Ambulatory Visit: Payer: Self-pay | Admitting: Dentistry

## 2007-11-11 ENCOUNTER — Ambulatory Visit: Payer: Self-pay | Admitting: Dentistry

## 2008-05-19 ENCOUNTER — Ambulatory Visit: Payer: Self-pay | Admitting: Dentistry

## 2009-02-09 ENCOUNTER — Ambulatory Visit (HOSPITAL_COMMUNITY): Admission: RE | Admit: 2009-02-09 | Discharge: 2009-02-09 | Payer: Self-pay | Admitting: Family Medicine

## 2009-02-09 IMAGING — CR DG CHEST 2V
2 series · 2 of 2 positions shown · non-contrast
Comparison: [DATE].

CLINICAL DATA: 75-year-old female with cough.  History of laryngeal
cancer.

CHEST - 2 VIEW

[view not recorded (1 of 2)]
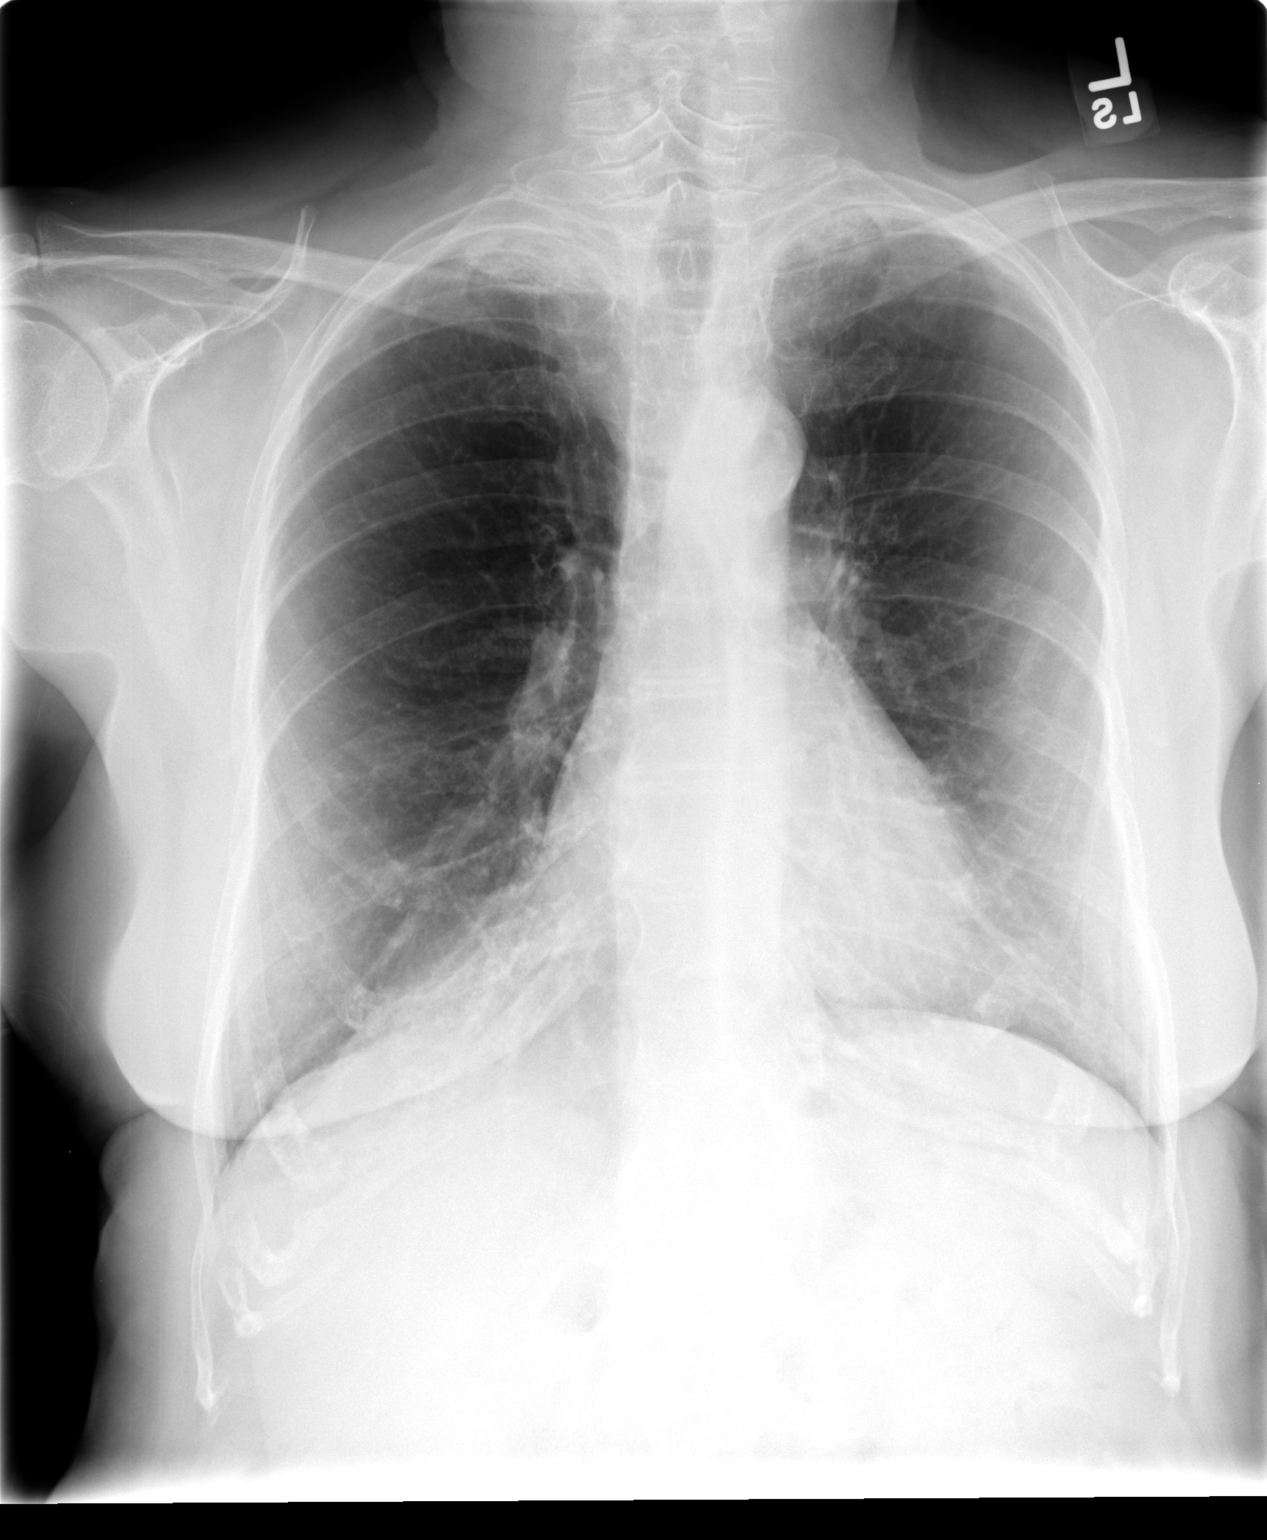

[view not recorded (2 of 2)]
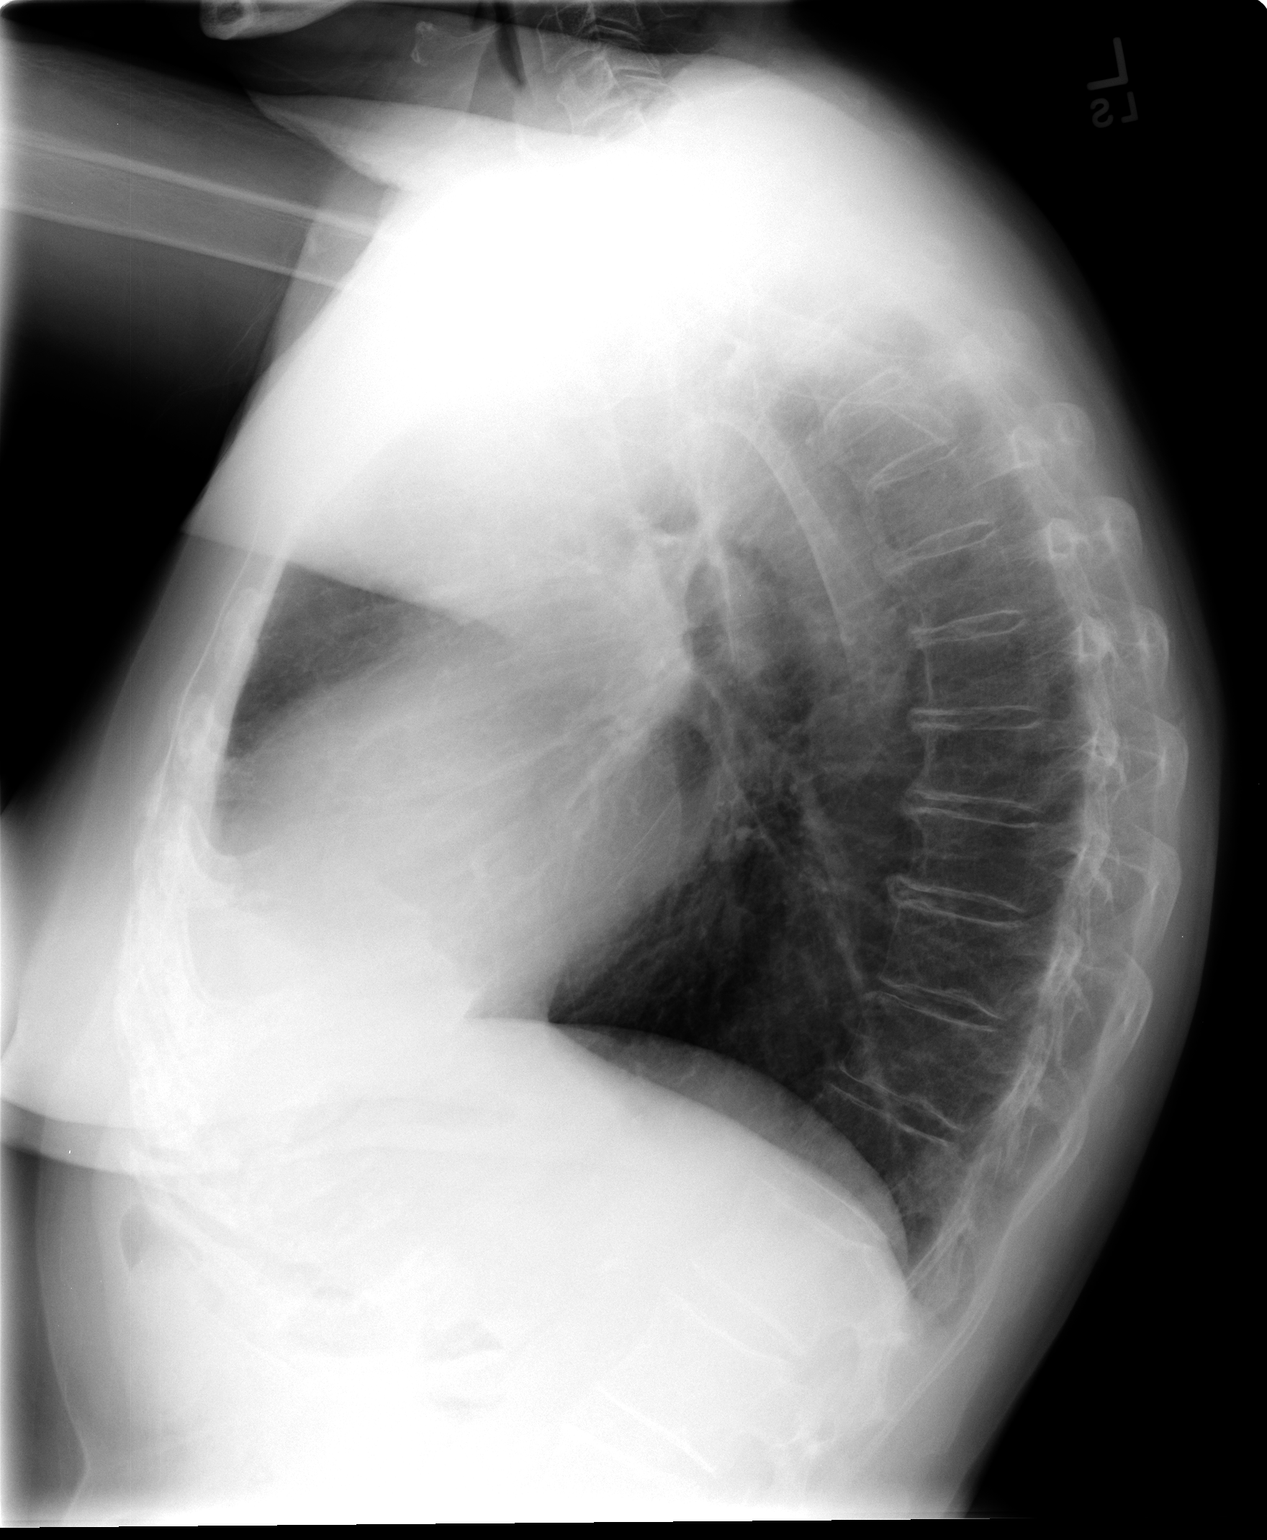

[2 of 2 positions shown; findings below may reference images not displayed]

FINDINGS: Stable large lung volumes, thoracic kyphosis, and
increased AP dimension to the chest.  Cardiac size and mediastinal
contours are within normal limits.  Visualized tracheal air column
is within normal limits.  No pneumothorax, pulmonary edema, pleural
effusion, consolidation, or confluent airspace opacity. No acute
osseous abnormality identified.
IMPRESSION: Stable. No acute cardiopulmonary abnormality.

## 2009-02-24 ENCOUNTER — Ambulatory Visit (HOSPITAL_COMMUNITY): Admission: RE | Admit: 2009-02-24 | Discharge: 2009-02-24 | Payer: Self-pay | Admitting: Family Medicine

## 2010-02-27 ENCOUNTER — Ambulatory Visit (HOSPITAL_COMMUNITY): Admission: RE | Admit: 2010-02-27 | Discharge: 2010-02-27 | Payer: Self-pay | Admitting: Family Medicine

## 2010-04-19 ENCOUNTER — Observation Stay (HOSPITAL_COMMUNITY): Admission: EM | Admit: 2010-04-19 | Discharge: 2010-04-21 | Payer: Self-pay | Admitting: Emergency Medicine

## 2010-04-19 IMAGING — CT CT ABD-PELV W/ CM
2 of 5 series · 15 of 46 positions shown, 17 images · IV contrast (Omnipaque 300)
Comparison: [DATE]

CLINICAL DATA: Left side pain, history of throat cancer

CT ABDOMEN AND PELVIS WITH CONTRAST
TECHNIQUE: Multidetector CT imaging of the abdomen and pelvis was
performed following the standard protocol during bolus
administration of intravenous contrast. Breast shield utilized.
Sagittal and coronal MPR images reconstructed from axial data set.
Contrast: 100 ml [OW] IV; water as oral contrast

[Series 2: abd_pel_with 5.0 b40f · axial · 0.71mm/px · z∈[-453,-53]mm · 12 of 90 slices shown, 14 images]
[im 5/90  soft-tissue]
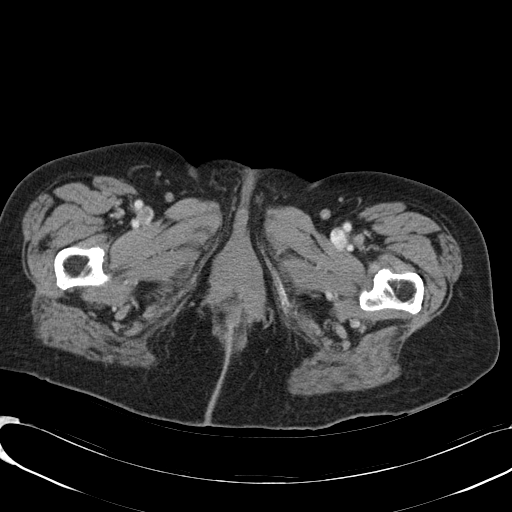
[im 5/90  bone]
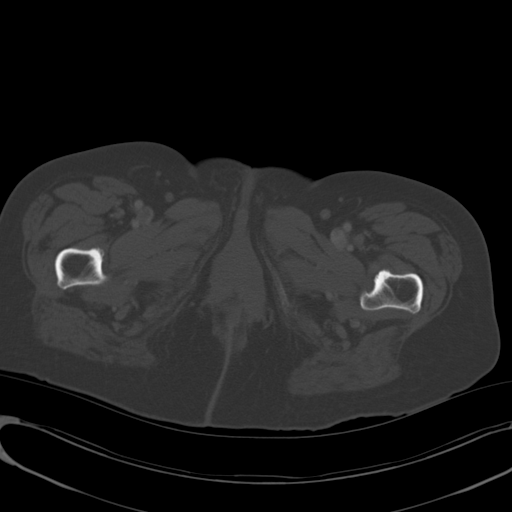
[im 15/90  soft-tissue]
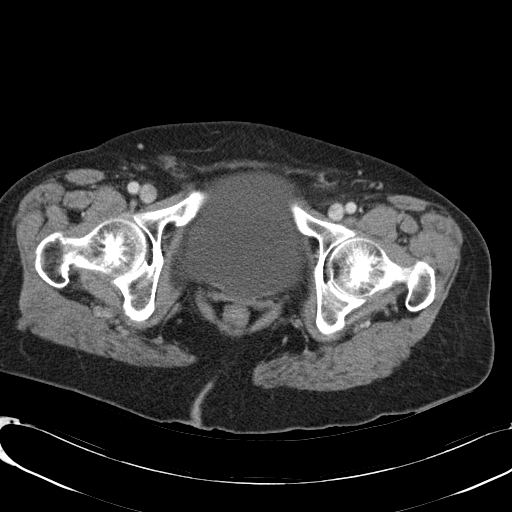
[im 20/90  soft-tissue]
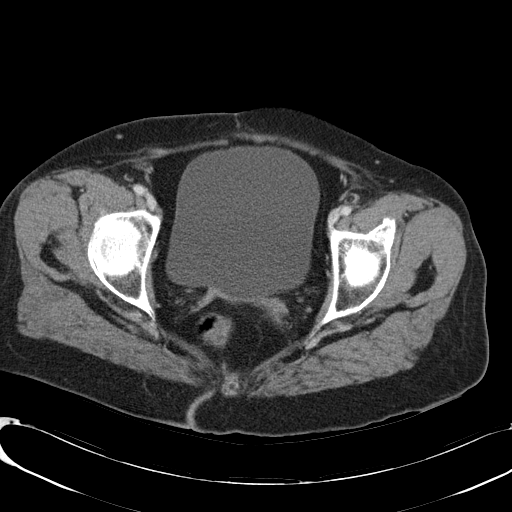
[im 25/90  soft-tissue]
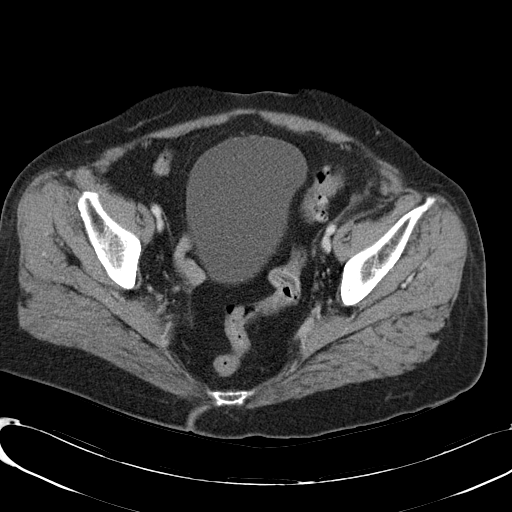
[im 35/90  soft-tissue]
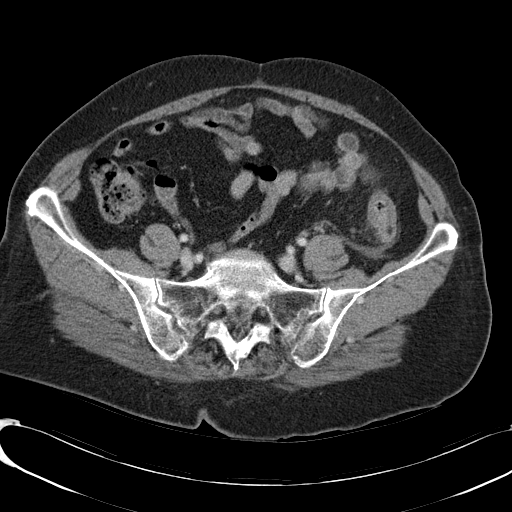
[im 40/90  soft-tissue]
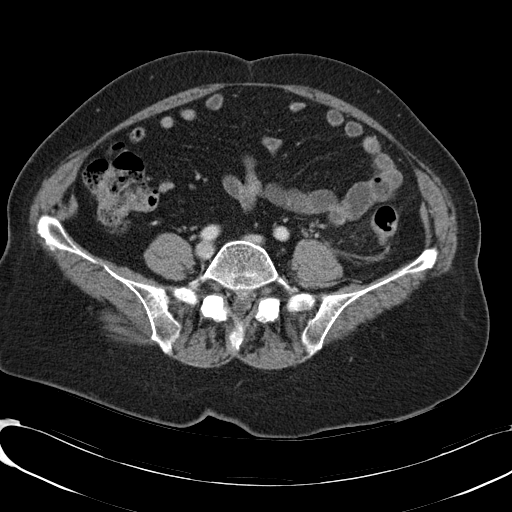
[im 50/90  soft-tissue]
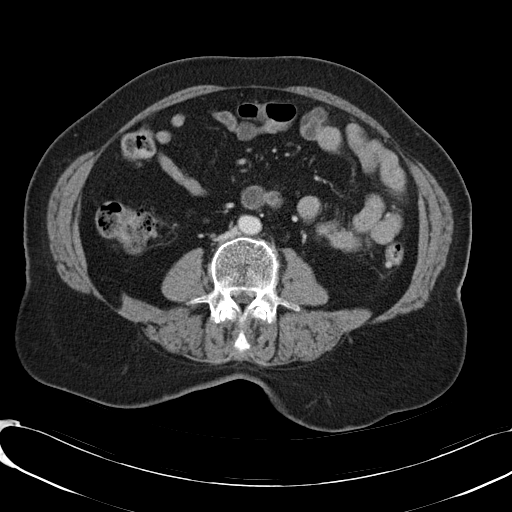
[im 55/90  soft-tissue]
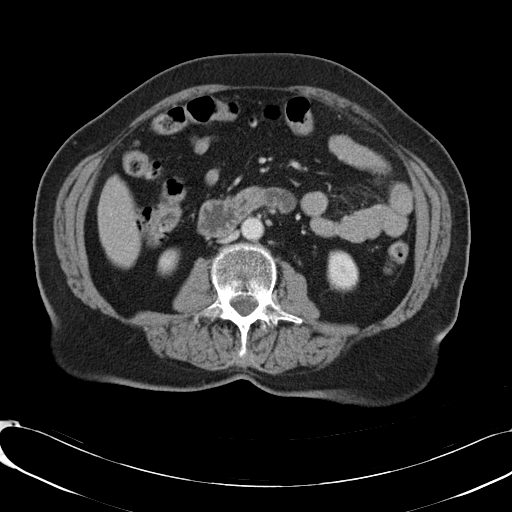
[im 65/90  soft-tissue]
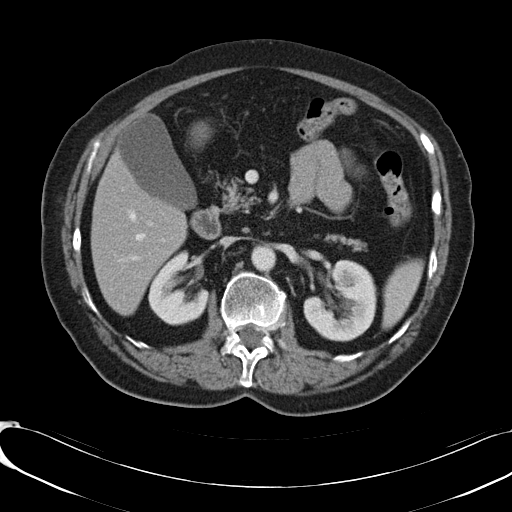
[im 65/90  bone]
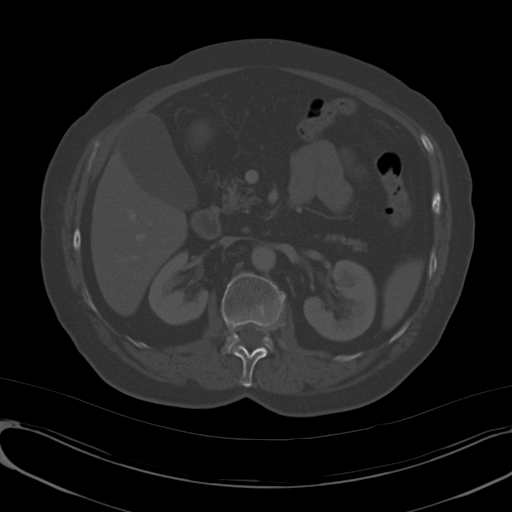
[im 70/90  soft-tissue]
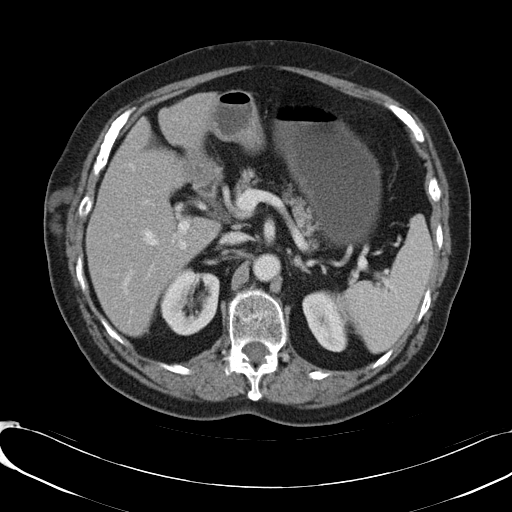
[im 75/90  soft-tissue]
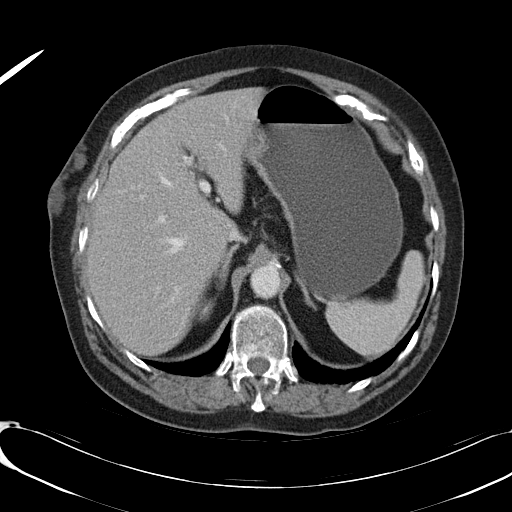
[im 85/90  soft-tissue]
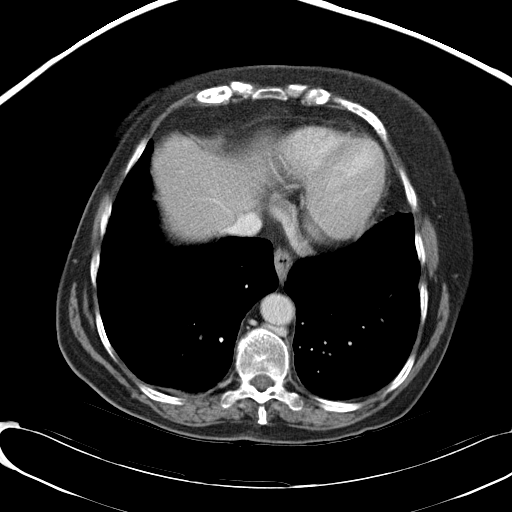

[Series 5: abd_pel_with 3.0 spo cor · coronal · 0.59mm/px · 3 of 85 slices shown]
[im 29/85  soft-tissue]
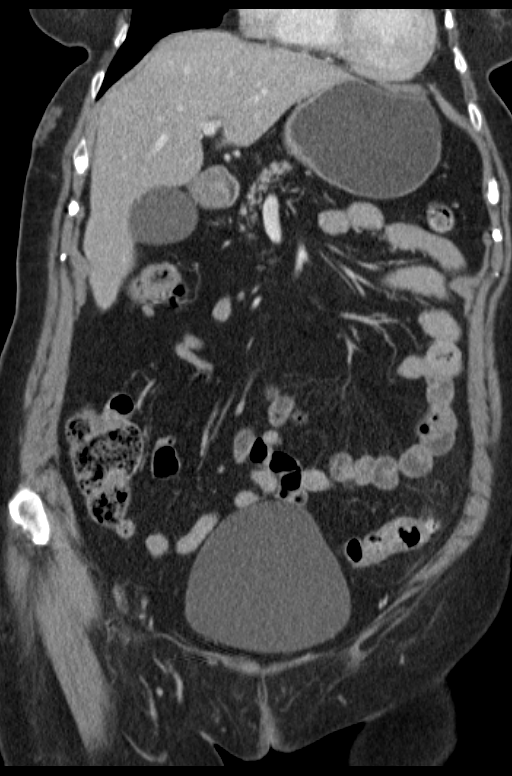
[im 38/85  soft-tissue]
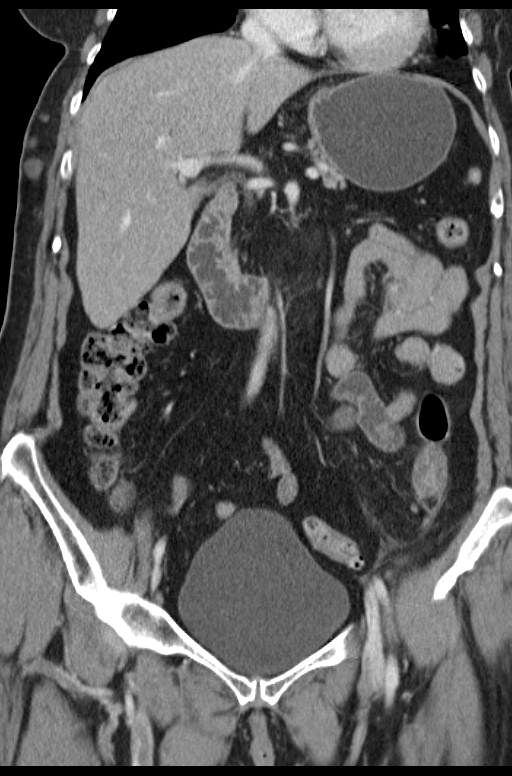
[im 47/85  soft-tissue]
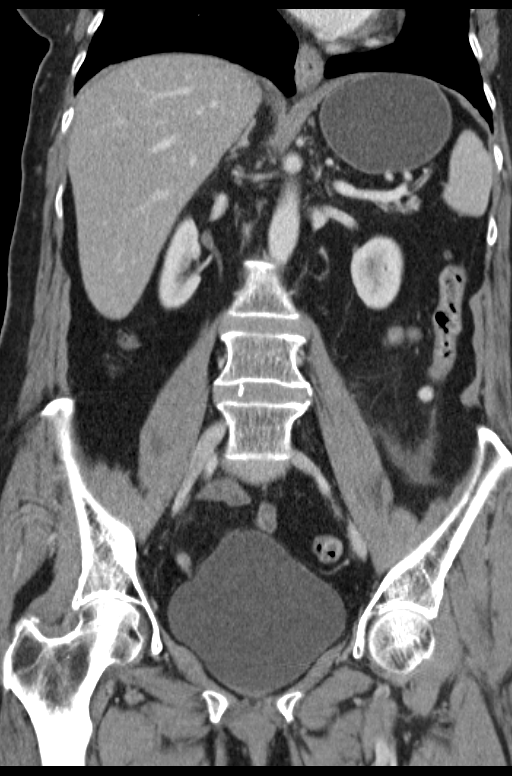

[15 of 46 positions shown; findings below may reference images not displayed]

FINDINGS: Dependent atelectasis bilateral lower lobes.
Multiple subcutaneous soft tissue nodules identified lateral right
upper quadrant abdominal wall, unchanged, uncertain etiology.
Liver, spleen, pancreas, kidneys, and adrenal glands normal.
Large atherosclerotic calcification proximal left renal artery.
Bladder and distal ureters unremarkable.
Uterus surgically absent with nonvisualization of ovaries and
appendix.
Well-distended normal-appearing stomach.
Small bowel loops grossly unremarkable for technique.
Scattered colonic diverticulosis with wall thickening and pericolic
inflammatory changes at descending sigmoid junction compatible with
acute diverticulitis.
Small amount of fluid in sigmoid mesocolon dependently without
discrete abscess.
No free intraperitoneal air, abscess, mass, adenopathy, or hernia.
Bones appear demineralized.
IMPRESSION: Acute diverticulitis at junction of descending and sigmoid colon
without evidence of abscess or free air.
Nonspecific subcutaneous nodules right lateral abdominal wall in
right upper quadrant, unchanged since [OW], recommend correlation
with physical exam.

## 2010-08-05 ENCOUNTER — Encounter: Payer: Self-pay | Admitting: Hematology and Oncology

## 2010-09-27 LAB — CBC
Hemoglobin: 11.8 g/dL — ABNORMAL LOW (ref 12.0–15.0)
MCHC: 34.4 g/dL (ref 30.0–36.0)
Platelets: 214 10*3/uL (ref 150–400)
RBC: 3.64 MIL/uL — ABNORMAL LOW (ref 3.87–5.11)
RDW: 13.8 % (ref 11.5–15.5)
WBC: 10.8 10*3/uL — ABNORMAL HIGH (ref 4.0–10.5)

## 2010-09-27 LAB — HEPATIC FUNCTION PANEL
AST: 29 U/L (ref 0–37)
Albumin: 4.1 g/dL (ref 3.5–5.2)
Bilirubin, Direct: 0.2 mg/dL (ref 0.0–0.3)
Indirect Bilirubin: 0.5 mg/dL (ref 0.3–0.9)
Total Protein: 7.5 g/dL (ref 6.0–8.3)

## 2010-09-27 LAB — DIFFERENTIAL
Basophils Absolute: 0 10*3/uL (ref 0.0–0.1)
Eosinophils Relative: 2 % (ref 0–5)
Lymphocytes Relative: 9 % — ABNORMAL LOW (ref 12–46)
Monocytes Absolute: 0.7 10*3/uL (ref 0.1–1.0)
Monocytes Relative: 7 % (ref 3–12)
Neutrophils Relative %: 82 % — ABNORMAL HIGH (ref 43–77)

## 2010-09-27 LAB — BASIC METABOLIC PANEL
Calcium: 9.3 mg/dL (ref 8.4–10.5)
Chloride: 106 mEq/L (ref 96–112)
Creatinine, Ser: 0.85 mg/dL (ref 0.4–1.2)
Potassium: 4.2 mEq/L (ref 3.5–5.1)
Sodium: 138 mEq/L (ref 135–145)

## 2010-09-27 LAB — URINALYSIS, ROUTINE W REFLEX MICROSCOPIC
Bilirubin Urine: NEGATIVE
Leukocytes, UA: NEGATIVE
Protein, ur: NEGATIVE mg/dL
Urobilinogen, UA: 0.2 mg/dL (ref 0.0–1.0)
pH: 6.5 (ref 5.0–8.0)

## 2010-12-01 NOTE — Group Therapy Note (Signed)
NAME:  Crystal Browning, Crystal Browning                 ACCOUNT NO.:  [id number]   MEDICAL RECORD NO.:  [id number]          PATIENT TYPE:  INP   LOCATION:  A339                          FACILITY:  [facility]   PHYSICIAN:  Angus G. Renard Matter, MD   DATE OF BIRTH:  1933/12/13   DATE OF PROCEDURE:  03/06/2005  DATE OF DISCHARGE:                                   PROGRESS NOTE   SUBJECTIVE:  This patient was admitted with what was felt to be  diverticulitis and lower abdominal pain.  CT did show evidence of  inflammatory edematous changes around the descending colon compatible with  diverticulitis, but no abscess.  The patient is feeling some better.   OBJECTIVE:  VITAL SIGNS:  Blood pressure 147/71, respirations 20, pulse 68,  temperature 98.  LUNGS:  Clear.  HEART:  Regular rhythm.  ABDOMEN:  Slight tenderness in left lower quadrant.   ASSESSMENT:  The patient was admitted with diverticulitis.   PLAN:  Progress the patient to oral medication.  She could be discharged  shortly.      Angus G. Renard Matter, MD  Electronically Signed     AGM/MEDQ  D:  03/06/2005  T:  03/06/2005  Job:  [id number]

## 2010-12-01 NOTE — Consult Note (Signed)
NAME:  Crystal Browning, Crystal Browning                 ACCOUNT NO.:  [id number]   MEDICAL RECORD NO.:  [id number]          PATIENT TYPE:  INP   LOCATION:  A339                          FACILITY:  [facility]   PHYSICIAN:  R. Roetta Sessions, M.D. DATE OF BIRTH:  [birth year]   DATE OF CONSULTATION:  03/05/2005  DATE OF DISCHARGE:                                   CONSULTATION   REASON FOR CONSULTATION:  Possible diverticulitis.   HISTORY OF PRESENT ILLNESS:  Ms. Crystal Browning is a pleasant 77 year old  Caucasian female who was admitted to the hospital March 02, 2005 with a 3-  day history of left lower quadrant abdominal pain. She describes her  symptoms as localized to the left lower quadrant. They started insidiously.  They have been unrelenting. She has not had any associated change in bowel  habits such has constipation, diarrhea, melena, or rectal bleeding. She has  not had any fever or chills. CT scan on August 19 with IV and oral contrast  demonstrated inflammatory changes sigmoid colon, consistent most likely with  diverticulitis. There was no free air abscess. The patient is status post  hysterectomy. The patient has been started on IV Cipro and Flagyl, and over  the course of the weekend, she has improved significant. She is more or less  tolerating a regular diet and has been afebrile, and her vital signs have  been normal. She tells me Dr. Lovell Sheehan may have done a colonoscopy  approximately seven years ago without significant findings. There is no  family history of IBD or colorectal neoplasia.   PAST MEDICAL HISTORY:  1.  Significant for coronary artery disease.  2.  History of depression.  3.  Osteoporosis.  4.  Hypertension.   PAST SURGICAL HISTORY:  Bilateral salpingo-oophorectomy, total abdominal  hysterectomy, Dr. Daphine Deutscher years ago.   MEDICATIONS ON ADMISSION:  1.  Lexapro.  2.  Toprol.  3.  Aspirin 81 mg daily.  4.  Benadryl.  5.  Calcium.  6.  Tussionex p.r.n. cough.   ALLERGIES:   TUBERCULIN SKIN TEST, PAXIL.   FAMILY HISTORY:  Negative for chronic GI or liver disease.   SOCIAL HISTORY:  The patient is widowed. She has had six children. [place]  [place]. No tobacco and no alcohol. No illicit drugs.   REVIEW OF SYSTEMS:  No recent chest pain, dyspnea on exertion. No fever or  chills. Otherwise as in history of present illness. She specifically denies  any upper GI tract symptoms such has odynophagia, dyspnea, early satiety,  reflux symptoms, nausea, vomiting, or weight loss.   PHYSICAL EXAMINATION:  GENERAL:  Reveals a pleasant, elderly, female, well  oriented and conversant, accompanied by her sister-in-law.  VITAL SIGNS:  Temperature 98, pulse 70, respiratory rate 20, BP 140/79.  SKIN:  Warm and dry.  HEENT:  No scleral icterus. Conjunctivae are pink. Oral cavity:  She is  edentulous. JVD is not prominent.  CHEST:  Lungs are clear to auscultation.  CARDIAC:  Regular rate and rhythm without murmur, gallop, or rub.  ABDOMEN:  Nondistended. Positive bowel sounds.  Abdomen is soft. She has  localized tenderness, left lower quadrant. No appreciable masses or rebound  tenderness.  EXTREMITIES:  No edema.   LABORATORY DATA:  Meetze count August 18 10.7, H and H 12.3 and 35.6, MCV  92.5, platelet count 254,000. Sodium 135, potassium 3.8, chloride 103, CO2  25, glucose 116, BUN 8, creatinine 0.9. Total bilirubin 0.4, direct  component 0.1, alkaline phosphatase 70, SGOT 79, SGPT 49, total protein 7.6,  albumin 4.1, calcium 9.0, lipase 23. Urinalysis:  Negative except for small  leukocytes, 3 to 6 Topp cells, 3 to 6 RBCs.   IMPRESSION:  Ms. Crystal Browning is a very pleasant, elderly lady admitted to the  hospital with left lower quadrant abdominal pain of three days' duration. I  have reviewed the CT scan with Dr. Sherrie Mustache. CT findings most consistent with  uncomplicated diverticulitis, although colonic tumor would be less likely  but not excluded at this  time.   She seems to be recovering uneventfully.   As a separate issue, she has a mild transaminitis on one set of liver  function studies. These need to be repeated.   RECOMMENDATIONS:  1.  Agree with Cipro and Flagyl. Would give her another good seven days'      worth of Cipro and Flagyl. Will go ahead and switch her to oral Cipro      500 mg orally b.i.d. and Flagyl 500 mg orally t.i.d.  2.  Agree with advancing diet. Will make it low residue.  3.  Repeat hepatic profile tomorrow morning.  4.  Will plan to bring her back in four to six weeks for a diagnostic      colonoscopy.  5.  Further recommendations to follow.   I have discussed the findings and recommendations with Dr. Renard Matter today.      Jonathon Bellows, M.D.  Electronically Signed     RMR/MEDQ  D:  03/05/2005  T:  03/05/2005  Job:  [id number]

## 2010-12-01 NOTE — Group Therapy Note (Signed)
NAME:  Crystal Browning, Crystal Browning                 ACCOUNT NO.:  [id number]   MEDICAL RECORD NO.:  [id number]          PATIENT TYPE:  INP   LOCATION:  A339                          FACILITY:  [facility]   PHYSICIAN:  Angus G. Renard Matter, MD   DATE OF BIRTH:  01-18-1934   DATE OF PROCEDURE:  03/05/2005  DATE OF DISCHARGE:                                   PROGRESS NOTE   SUBJECTIVE:  This patient was admitted with what was felt to be  diverticulitis.  She was admitted through the ED with lower abdominal pain.  CT did show evidence of inflammatory edematous changes around the descending  colon compatible with diverticulitis, but no abscess.   OBJECTIVE:  VITAL SIGNS:  Blood pressure 133/58, respirations 20, pulse 79,  temperature 97.5.  LUNGS:  Clear.  HEART:  Regular rhythm.  ABDOMEN:  The patient has tenderness in the left lower quadrant.   ASSESSMENT:  The patient was admitted with what was felt to be  diverticulitis.   PLAN:  Continue IV antibiotics.  Will obtain consult in GI Service with  referrals to possible scheduling colonoscopy.      Angus G. Renard Matter, MD  Electronically Signed     AGM/MEDQ  D:  03/05/2005  T:  03/05/2005  Job:  [id number]

## 2010-12-01 NOTE — Group Therapy Note (Signed)
NAME:  Crystal Browning, Crystal Browning                 ACCOUNT NO.:  [id number]   MEDICAL RECORD NO.:  [id number]          PATIENT TYPE:  INP   LOCATION:  A339                          FACILITY:  [facility]   PHYSICIAN:  Angus G. Renard Matter, MD   DATE OF BIRTH:  09-26-1933   DATE OF PROCEDURE:  03/03/2005  DATE OF DISCHARGE:                                   PROGRESS NOTE   SUBJECTIVE:  This patient was admitted with what was felt to be  diverticulitis. She was admitted through the ED with lower abdominal pain. A  CT showed evidence of inflammatory edematous changes around the descending  colon compatible with diverticulitis. No abscess. The patient was started on  IV antibiotics, Cipro 400 mg every 12 hours and Flagyl 500 mg every 8 hours.  The patient's condition is stable.   OBJECTIVE:  VITAL SIGNS:  Blood pressure 128/46, respirations 20, pulse 82,  temperature 98.4.  LUNGS:  Clear to P&A.  HEART:  Regular rhythm.  ABDOMEN:  The patient has some tenderness in the left lower quadrant.   ASSESSMENT:  The patient was admitted with acute diverticulitis.   PLAN:  Plan to continue current regimen.      Angus G. Renard Matter, MD  Electronically Signed     AGM/MEDQ  D:  03/03/2005  T:  03/03/2005  Job:  [id number]

## 2010-12-01 NOTE — H&P (Signed)
NAME:  Crystal Browning, Crystal Browning                 ACCOUNT NO.:  [id number]   MEDICAL RECORD NO.:  [id number]          PATIENT TYPE:  INP   LOCATION:  A339                          FACILITY:  [facility]   PHYSICIAN:  Angus G. Renard Matter, MD   DATE OF BIRTH:  01/10/1934   DATE OF ADMISSION:  03/02/2005  DATE OF DISCHARGE:  LH                                HISTORY & PHYSICAL   HISTORY:  [age]-year-old Crystal Browning female presented to the ED with left  lower quadrant pain which had been present some 3 days.  Pain was constant  cramping-type pain of moderate severity.  Patient had a CT of abdomen with  contrast done which showed streaky inflammatory edematous changes around the  distal descending colon, scattered diverticula, no free fluid, urinary  bladder physiologically distended.  Patient was felt to have diverticulitis,  was started on intravenous fluids and antibiotics and subsequently was  admitted.  Laboratory data on admission showed a CBC P[place] blood count  10,700, hemoglobin 12.3, hematocrit 35.6; sodium 135, potassium 3.8,  chloride 103, CO2 25, glucose 116, BUN 8, creatinine 0.9, calcium 9; liver  enzymes SGOT 79, SGPT 49, alkaline phosphatase 70, bilirubin 0.4; urinalysis  3-6 wbc's, 3-6 rbc's.   SOCIAL HISTORY:  Patient does not smoke or drink alcohol.   FAMILY HISTORY:  Positive for coronary artery disease.   PAST MEDICAL HISTORY:  Patient has a history of depression, osteoporosis,  hypertension.   ALLERGIES:  TUBERCULIN TEST and PAXIL.   MEDICATION LIST:  1.  Lexapro 10 mg daily.  2.  Toprol XL 50 mg daily.  3.  Aspirin 81 mg daily.  4.  Benadryl 50 mg p.r.n.  5.  Calcium chloride 500 mg b.i.d.  6.  Tussionex teaspoon q.12 h. p.r.n. cough.   REVIEW OF SYSTEMS:  HEENT:  Negative.  CARDIOPULMONARY:  No cough,  hemoptysis, or dyspnea.  GI:  No bowel irregularity but pain over the left  lower quadrant.  GU:  No dysuria or hematuria.   EXAMINATION:  GENERAL:  Alert female uncomfortable with  left lower quadrant  pain.  VITAL SIGNS:  Blood pressure 166/72, respirations 18, pulse 96.  HEENT:  Eyes PERRLA.  TM negative.  Oropharynx benign.  Neck supple; no JVD  or thyroid abnormalities.  LUNGS:  Clear to P&A.  HEART:  Regular rhythm; no murmurs.  ABDOMEN:  Patient has tenderness left lower quadrant.  SKIN:  Warm and dry.  EXTREMITIES:  Free of edema.  NEUROLOGICAL:  No focal deficit.   DIAGNOSIS:  Left lower quadrant pain thought to be secondary to  diverticulitis/diverticular disease.      Angus G. Renard Matter, MD  Electronically Signed     AGM/MEDQ  D:  03/04/2005  T:  03/04/2005  Job:  [id number]

## 2010-12-01 NOTE — Discharge Summary (Signed)
NAME:  Crystal Browning, Crystal Browning                 ACCOUNT NO.:  [id number]   MEDICAL RECORD NO.:  [id number]          PATIENT TYPE:  INP   LOCATION:  A339                          FACILITY:  [facility]   PHYSICIAN:  Angus G. Renard Matter, MD   DATE OF BIRTH:  [birth year]   DATE OF ADMISSION:  03/02/2005  DATE OF DISCHARGE:  08/22/2006LH                                 DISCHARGE SUMMARY   DIAGNOSES:  1.  Diverticulitis.  2.  Depressive disorder.  3.  Hypertension.  4.  Osteoporosis.   CONDITION ON DISCHARGE:  Stable and improved.   HISTORY OF PRESENT ILLNESS:  A 77 year old Crystal Browning female presented to the ED  with left lower quadrant pain.  Present for three days.  Pain was constant,  cramping-type pain of moderate severity.  She had a CT of the abdomen with  contrast done which showed streaky inflammatory edematous changes around the  descending colon, scattered diverticula, distended bladder.   PHYSICAL EXAMINATION:  GENERAL APPEARANCE:  Alert female, uncomfortable with  left lower quadrant pain.  VITAL SIGNS:  Blood pressure 166/72, respirations 18, pulse 96.  HEENT:  Eyes PERRLA.  TMs negative.  Oropharynx benign.  LUNGS:  Clear to P&A.  HEART:  Regular rhythm.  No murmurs.  ABDOMEN:  No palpable organs or masses, but tenderness in the left lower  quadrant.   LABORATORY DATA:  Admission CBC:  WBC 10,700, hemoglobin 12.3, hematocrit  35.6.  Chemistries:  Sodium 135, potassium 3.8, chloride 103, CO2 25,  glucose 116, BUN 8, creatinine 0.9, calcium 9, total protein 7.6, albumin  4.1.  Liver enzymes:  SGOT 79, SGPT 49, alk-phos 70, bilirubin 0.4.  Urinalysis:  Essentially normal.  Serum iron 89, iron-binding capacity 79.   X-RAYS:  CT of the abdomen with contrast:  Distal descending colon.  Diverticulitis without evidence of abscess.   HOSPITAL COURSE:  The patient, at time of admission, was placed on IV  fluids, normal saline 50 cc an hour.  She was started on IV Cipro 400 mg  q.12 h and Flagyl 500 mg  q.8 h.  She was given morphine 2-4 mg q.4 h p.r.n.  pain.  She was continued on Lexapro 10 mg daily, Toprol XL 50 mg daily,  aspirin 81 mg daily.  Towards the latter part of hospitalization, the  patient was placed on Duke's mixture, Chloraseptic lozenges.  She was seen  by GI Service and  started on a low residue diet.  They plan to bring her back in 4-6 weeks for  diagnostic colonoscopy.  The patient improved during hospital day and was  able to be discharged in hospital day #3 on Cipro 500 mg b.i.d. and Flagyl  500 mg t.i.d.      Angus G. Renard Matter, MD  Electronically Signed     AGM/MEDQ  D:  04/03/2005  T:  04/03/2005  Job:  [id number]

## 2010-12-01 NOTE — Op Note (Signed)
NAME:  Crystal Browning, Crystal Browning                 ACCOUNT NO.:  [id number]   MEDICAL RECORD NO.:  [id number]          PATIENT TYPE:  AMB   LOCATION:  SDS                          FACILITY:  [facility]   PHYSICIAN:  Jefry H. Pollyann Kennedy, MD     DATE OF BIRTH:  1934/02/01   DATE OF PROCEDURE:  03/27/2005  DATE OF DISCHARGE:                                 OPERATIVE REPORT   PREOPERATIVE DIAGNOSIS:  Laryngeal mass.   POSTOPERATIVE DIAGNOSIS:  Laryngeal mass.   PROCEDURE:  Direct laryngoscopy with biopsy and esophagoscopy.   SURGEON:  Jefry H. Pollyann Kennedy, M.D.   General anesthesia was used.  No complications.  Blood loss minimal.   FINDINGS:  A large exophytic mass involving the right arytenoid and  posterior aspect of the piriform sinus, abutting up against the esophageal  introitus but without actually involving the esophageal inlet.  The  esophagus was clear.  The remainder of the larynx was healthy.   HISTORY:  This is a 77 year old lady with a history of a sore throat for a  couple of months.  The risks, benefits, alternatives, and complications of  the procedure were explained to the patient, who seemed to understand and  agreed to surgery.   PROCEDURE:  The patient was taken to the operating room and placed on the  operating table in supine position.  Following induction of general  endotracheal anesthesia, the table was turned and the table was draped in  standard fashion.   #1.  Esophagoscopy.  The rigid esophagoscope was entered into the oral  cavity, through the cricopharyngeus and into the cervical esophagus, passed  as far as the scope would allow.  It was slowly withdrawn, examining  circumferentially the lining of the esophagus.  There were no intrinsic  masses noted.  Upon leaving the esophageal introitus, the mass was seen  anteriorly and to the right side.   #2.  Direct laryngoscopy with biopsy.  A Jako laryngoscope was then used to  perform laryngoscopy.  The above-mentioned findings were  noted.  Multiple  biopsies were taken from the mass.  The lesion was treated with topical  adrenalin for hemostasis.  The laryngoscope was then removed.  The patient  was then awakened from anesthesia, extubated and transferred to recovery in  stable condition.      Jefry H. Pollyann Kennedy, MD  Electronically Signed    JHR/MEDQ  D:  03/27/2005  T:  03/27/2005  Job:  [id number]

## 2010-12-01 NOTE — H&P (Signed)
Crystal Browning, Crystal Browning                 ACCOUNT NO.:  [id number]   MEDICAL RECORD NO.:  [id number]          PATIENT TYPE:  OBV   LOCATION:  A330                          FACILITY:  [facility]   PHYSICIAN:  Mila Homer. Sudie Bailey, M.D.DATE OF BIRTH:  Aug 27, 1933   DATE OF ADMISSION:  07/14/2005  DATE OF DISCHARGE:  LH                                HISTORY & PHYSICAL   HISTORY OF PRESENT ILLNESS:  This 77 year old patient of Dr. 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  She is  currently getting treated for this by Dr. Margo Common with chemotherapy and by  a radiation-oncologist with radiation and daughter called me today just to  let me know she just stopped eating and drinking over the last week and is  getting very weak.   She never smoked cigarettes but her husband was a heavy smoker and she had a  lot of exposure to this plus wood smoke.   CURRENT MEDICATIONS:  1.  Lexapro 10 mg daily.  2.  Toprol XL 50 mg daily.  3.  Aspirin 81 mg daily.  4.  Benadryl 50 mg p.r.n.  5.  Calcium carbonate with vitamin D 500 mg twice daily.  6.  Tussionex teaspoon twice daily p.r.n.  7.  Nystatin two teaspoons q.i.d.  8.  Zantac 150 mg q.i.d.  9.  Compazine 10 mg q.4 h for nausea.  10. Imodium for diarrhea.  11. Folic acid 1 mg daily.  12. Hydrocodone/acetaminophen liquid 5 mL q.i.d. p.r.n. pain.   ALLERGIES:  She gets a rash with PAXIL and she cannot tolerate TUBERCULIN  PROTEIN.   PAST MEDICAL HISTORY:  CVA, the throat cancer, diverticulitis,  gastroesophageal reflux disease, heart disease, heart murmur, hiatal hernia,  hypercholesterolemia, hypertension, and myocardial infarction.   PHYSICAL EXAMINATION:  Initial exam showed a pleasant and cooperative woman  in her 31s who looked weak.  Temperature is 97.2, blood pressure is 94/61,  pulse 95, respiratory rate 20.  The blood pressure went up to 120/59  eventually.  O2 saturation was 95%, recheck 100%.  Patient was  somewhat  obese but appeared to be oriented and alert in no acute distress.  She had a  problem talking due to pain.  The oropharynx was normal except for a thick  yellowish material on the tongue.  There were negative anterior cervical  nodes but there was tenderness throughout the anterior cervical region.  The  lungs were clear throughout.  The heart had a regular rhythm, rate of 70.  The abdomen was soft without hepatosplenomegaly or mass.  There is no edema  in the ankles.   LABORATORIES:  Her CMP was essentially normal except for an SGOT of 39,  albumin of 3.2, and her CBC showed a Bleicher cell count 4800, H&H 10.8 and  30.8.   ADMISSION DIAGNOSES:  1.  Mild dehydration.  2.  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  3.  Laryngeal carcinoma.  4.  Gastroesophageal reflux disease.  5.  History  of diverticulitis.  6.  Essential hypertension.  7.  History of myocardial infarction.   PLAN:  Plan of treatment includes IV D-5 1/2-normal saline at 75 mL/hr with  multivitamin IV daily and continue Lexapro 10 mg daily with Toprol XL 50 mg  daily, nystatin two teaspoons p.o. q.i.d., enteric-coated aspirin 81 mg  daily, hydrocodone/acetaminophen teaspoon p.o. daily p.r.n. pain, Zantac 150  mg p.o. q.i.d., Tussionex teaspoon twice daily for cough and folic acid 1 mg  p.o. daily and Ensure can t.i.d.  We will have her seen by the dietitian.      Mila Homer. Sudie Bailey, M.D.  Electronically Signed     SDK/MEDQ  D:  07/14/2005  T:  07/14/2005  Job:  [id number]

## 2010-12-01 NOTE — Group Therapy Note (Signed)
NAME:  Crystal Browning, Crystal Browning                 ACCOUNT NO.:  [id number]   MEDICAL RECORD NO.:  [id number]          PATIENT TYPE:  INP   LOCATION:  A339                          FACILITY:  [facility]   PHYSICIAN:  Angus G. Renard Matter, MD   DATE OF BIRTH:  1934/03/11   DATE OF PROCEDURE:  03/04/2005  DATE OF DISCHARGE:                                   PROGRESS NOTE   SUBJECTIVE:  This patient was admitted with what was felt to be  diverticulitis.  She was admitted through the ED with lower abdominal pain.  CT did show evidence of inflammatory edematous changes around the descending  colon compatible with diverticulitis.  No abscess.  She remains on IV Cipro  and Flagyl.   OBJECTIVE:  VITAL SIGNS:  Blood pressure 100/42, respirations 20, pulse 73,  temperature 97.7.  LUNGS:  Clear.  HEART:  Regular rhythm.  ABDOMEN:  Patient has tenderness over the left lower quadrant.   ASSESSMENT:  Patient was admitted with lower abdominal pain and what appears  to be flare of diverticulitis.   PLAN:  To continue current regimen with IV antibiotics, Cipro and Flagyl.      Angus G. Renard Matter, MD  Electronically Signed     AGM/MEDQ  D:  03/04/2005  T:  03/04/2005  Job:  [id number]

## 2010-12-01 NOTE — Group Therapy Note (Signed)
NAME:  Crystal Browning, Crystal Browning                 ACCOUNT NO.:  [id number]   MEDICAL RECORD NO.:  [id number]          PATIENT TYPE:  OBV   LOCATION:  A330                          FACILITY:  [facility]   PHYSICIAN:  Angus G. Renard Matter, MD   DATE OF BIRTH:  [birth year]   DATE OF PROCEDURE:  07/15/2005  DATE OF DISCHARGE:                                   PROGRESS NOTE   SUBJECTIVE:  This patient has history of laryngeal carcinoma.  She was  admitted with dehydration.  She had been treated with chemotherapy and  radiation.  Had diminished appetite for several days and mild dehydration.  She is receiving IV fluids.   OBJECTIVE:  VITAL SIGNS:  Blood pressure 95/50, respirations 20, pulse 71,  temperature 98.4.  HEART:  Regular rhythm.  LUNGS:  Rhonchi over lower lung field.  ABDOMEN:  No palpable organs or masses.   ASSESSMENT:  The patient has history of laryngeal carcinoma, dehydration and  apparently upper respiratory infection, urinary tract infection.   PLAN:  Continue IV fluids.  Will obtain urine for culture and sensitivity.  Add IV Levaquin 500 mg daily.      Angus G. Renard Matter, MD  Electronically Signed     AGM/MEDQ  D:  07/15/2005  T:  07/16/2005  Job:  [id number]

## 2010-12-20 ENCOUNTER — Emergency Department (HOSPITAL_COMMUNITY): Payer: Medicare Other

## 2010-12-20 ENCOUNTER — Emergency Department (HOSPITAL_COMMUNITY)
Admission: EM | Admit: 2010-12-20 | Discharge: 2010-12-21 | Disposition: A | Discharge status: Home / Self Care | Payer: Medicare Other | Attending: Emergency Medicine | Admitting: Emergency Medicine

## 2010-12-20 DIAGNOSIS — I440 Atrioventricular block, first degree: Secondary | ICD-10-CM | POA: Insufficient documentation

## 2010-12-20 DIAGNOSIS — Z8673 Personal history of transient ischemic attack (TIA), and cerebral infarction without residual deficits: Secondary | ICD-10-CM | POA: Insufficient documentation

## 2010-12-20 DIAGNOSIS — I10 Essential (primary) hypertension: Secondary | ICD-10-CM | POA: Insufficient documentation

## 2010-12-20 DIAGNOSIS — R0602 Shortness of breath: Secondary | ICD-10-CM | POA: Insufficient documentation

## 2010-12-20 DIAGNOSIS — F410 Panic disorder [episodic paroxysmal anxiety] without agoraphobia: Secondary | ICD-10-CM | POA: Insufficient documentation

## 2010-12-20 DIAGNOSIS — E039 Hypothyroidism, unspecified: Secondary | ICD-10-CM | POA: Insufficient documentation

## 2010-12-20 IMAGING — CR DG CHEST 1V PORT
1 series · 1 of 1 positions shown · non-contrast
Comparison: [DATE]

CLINICAL DATA: Short of breath.  Anxiety attack.

PORTABLE CHEST - 1 VIEW

[view not recorded]
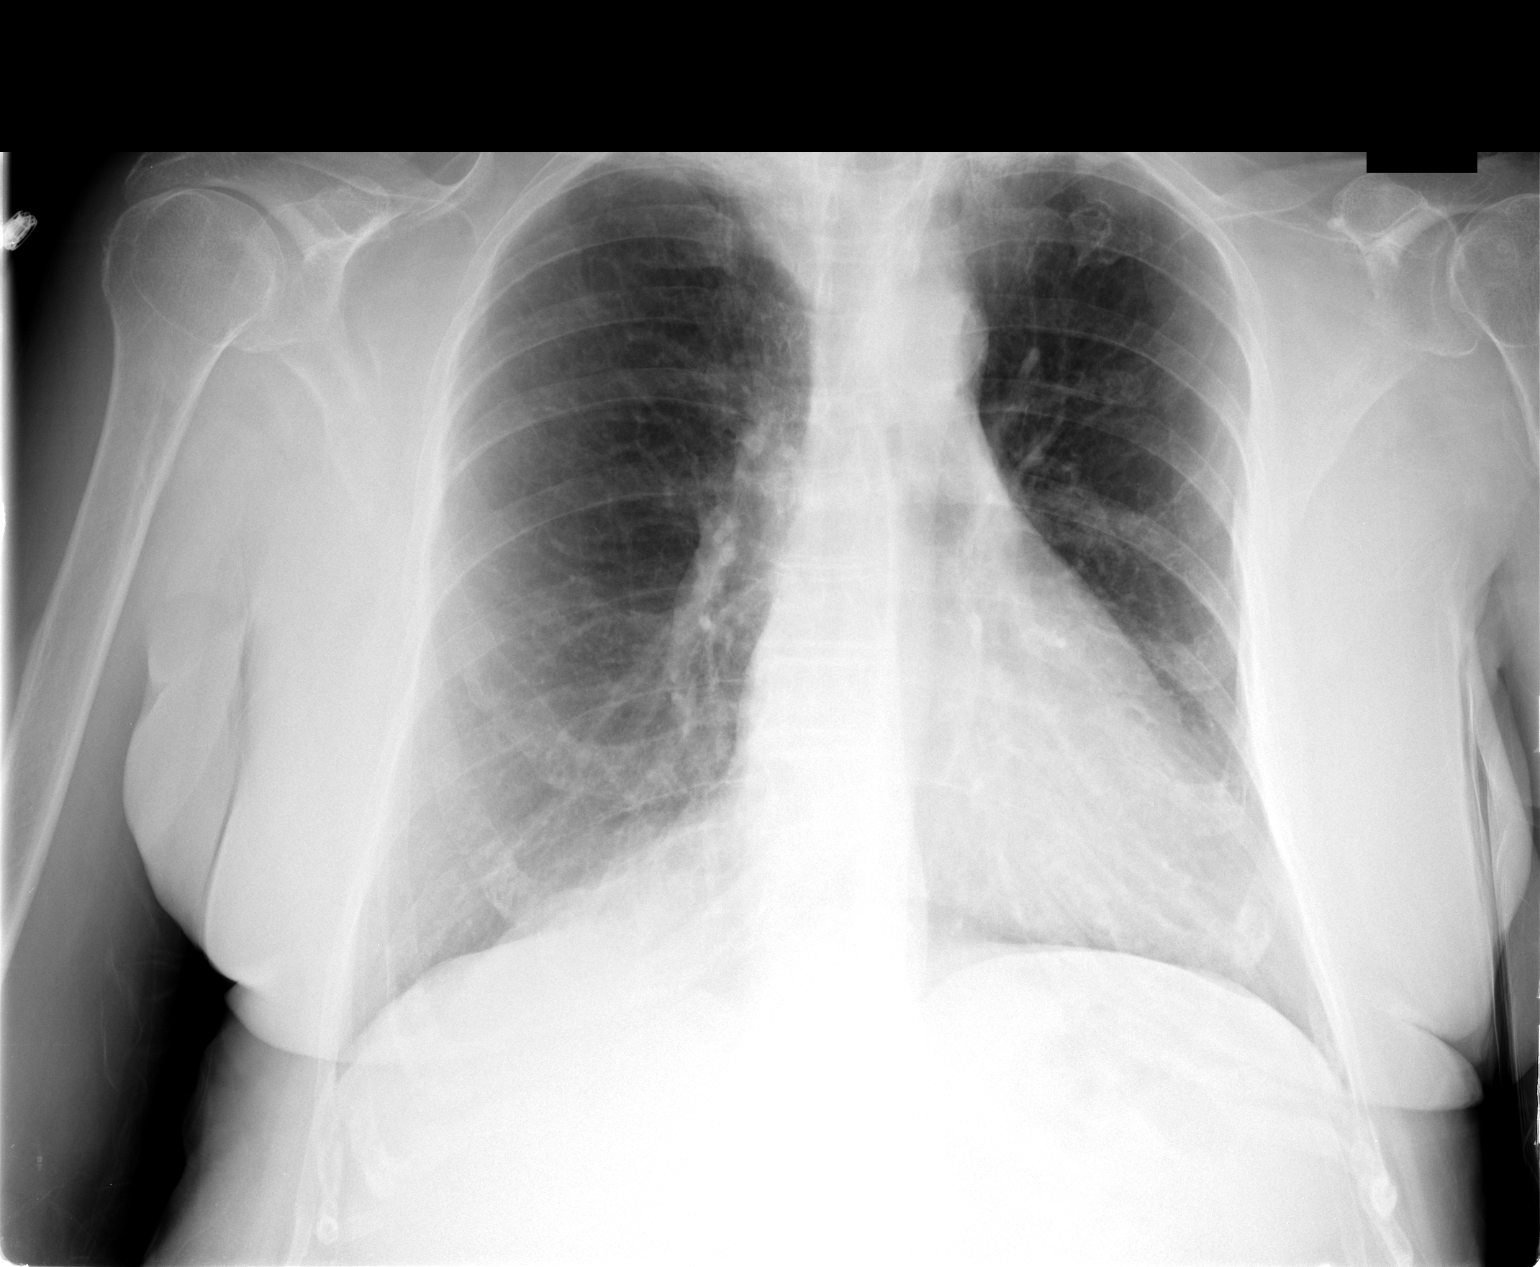

[1 of 1 positions shown; findings below may reference images not displayed]

FINDINGS: Normal heart size and pulmonary vascularity.  Increased
density in the right cardiophrenic angle is stable since the prior
study may represent prominent cardiac fat pad or pericardial cyst.
Stable increased density in the lung apices bilaterally.  No focal
airspace consolidation in the lungs.  No blunting of costophrenic
angles.  No significant changes since the previous study.
IMPRESSION: No evidence of active pulmonary disease.  Opacification in the
right cardiophrenic angle is stable and may represent prominent fat
pad or cyst. Stable increased density in the apices.

## 2010-12-21 LAB — DIFFERENTIAL
Basophils Relative: 0 % (ref 0–1)
Monocytes Relative: 8 % (ref 3–12)
Neutrophils Relative %: 69 % (ref 43–77)

## 2010-12-21 LAB — COMPREHENSIVE METABOLIC PANEL
ALT: 19 U/L (ref 0–35)
Alkaline Phosphatase: 57 U/L (ref 39–117)
Chloride: 103 mEq/L (ref 96–112)
GFR calc Af Amer: >60 mL/min (ref >60)
Potassium: 3.5 mEq/L (ref 3.5–5.1)
Sodium: 139 mEq/L (ref 135–145)
Total Bilirubin: 0.3 mg/dL (ref 0.3–1.2)
Total Protein: 7.7 g/dL (ref 6.0–8.3)

## 2010-12-21 LAB — CBC
Hemoglobin: 12 g/dL (ref 12.0–15.0)
MCH: 30.5 pg (ref 26.0–34.0)
WBC: 6.8 10*3/uL (ref 4.0–10.5)

## 2010-12-26 ENCOUNTER — Emergency Department (HOSPITAL_COMMUNITY): Payer: Medicare Other

## 2010-12-26 ENCOUNTER — Emergency Department (HOSPITAL_COMMUNITY)
Admission: EM | Admit: 2010-12-26 | Discharge: 2010-12-26 | Disposition: A | Discharge status: Home / Self Care | Payer: Medicare Other | Attending: Emergency Medicine | Admitting: Emergency Medicine

## 2010-12-26 DIAGNOSIS — R059 Cough, unspecified: Secondary | ICD-10-CM | POA: Insufficient documentation

## 2010-12-26 DIAGNOSIS — I10 Essential (primary) hypertension: Secondary | ICD-10-CM | POA: Insufficient documentation

## 2010-12-26 DIAGNOSIS — R05 Cough: Secondary | ICD-10-CM | POA: Insufficient documentation

## 2010-12-26 IMAGING — CR DG CHEST 2V
2 series · 2 of 2 positions shown · non-contrast
Comparison: [DATE]

CLINICAL DATA: Cough, congestion and shortness of breath.  History
hypertension, throat cancer, TIA, heart murmur.

CHEST - 2 VIEW

[view not recorded (1 of 2)]
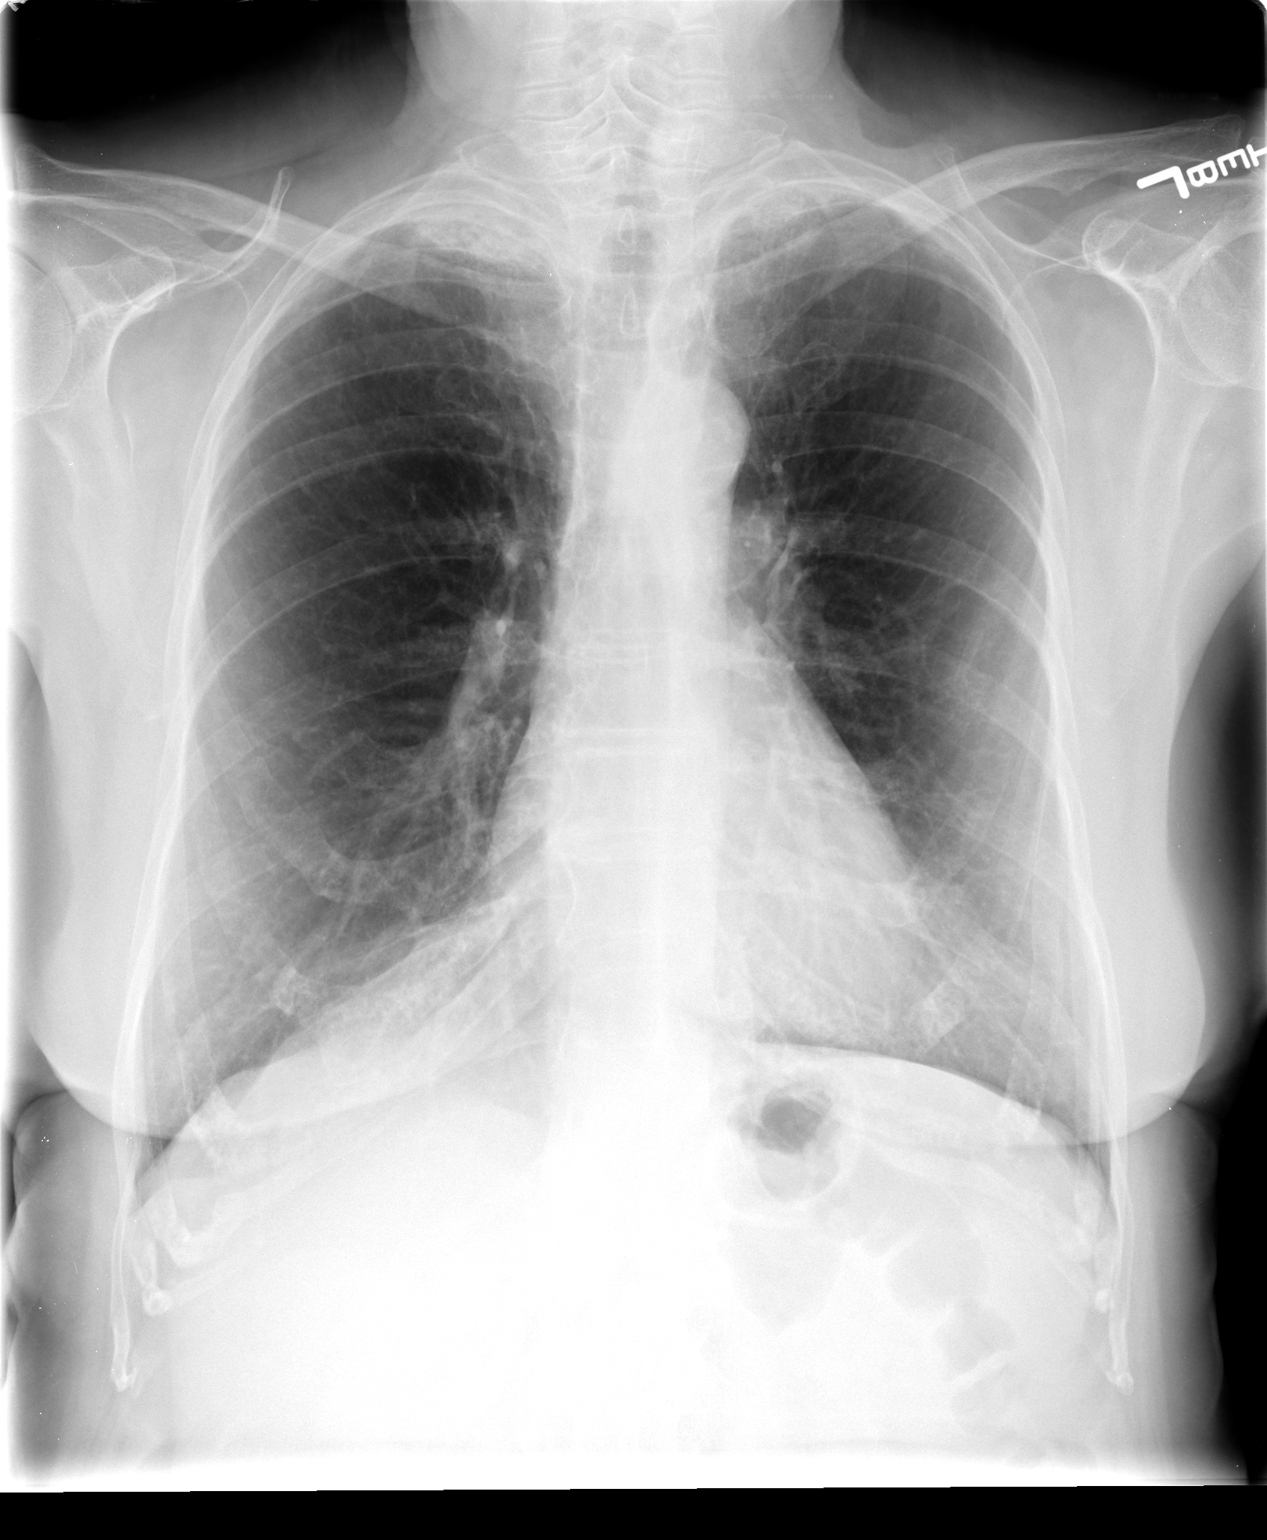

[view not recorded (2 of 2)]
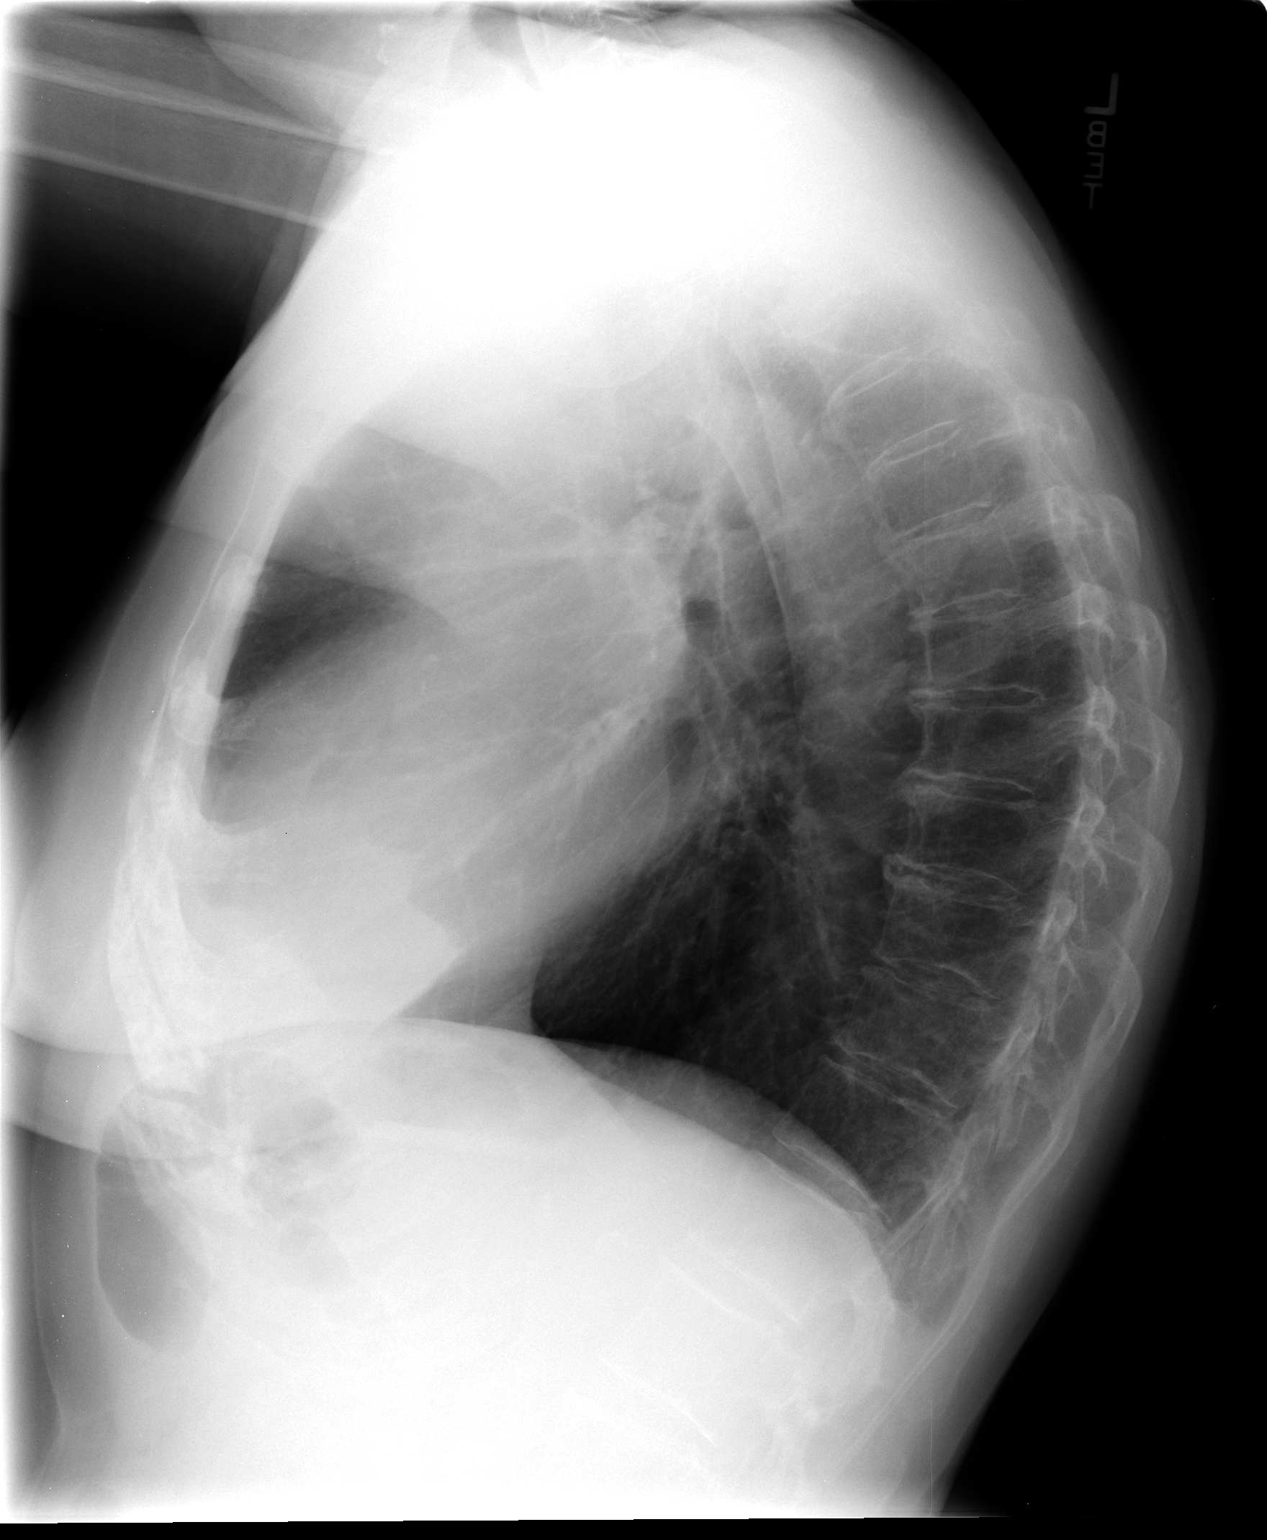

[2 of 2 positions shown; findings below may reference images not displayed]

FINDINGS: Lungs are hyperinflated.  Perihilar bronchitic changes
are present.  Heart size is normal.  There are biapical pleural
parenchymal changes, stable.  There are no focal consolidations or
definite pleural effusions.  Mild degenerate changes are seen in
the spine.
IMPRESSION: 1.  COPD and bronchitic changes.
2. No focal pulmonary abnormality.

## 2011-03-28 DIAGNOSIS — C801 Malignant (primary) neoplasm, unspecified: Secondary | ICD-10-CM

## 2011-03-28 HISTORY — DX: Malignant (primary) neoplasm, unspecified: C80.1

## 2011-07-31 ENCOUNTER — Encounter (HOSPITAL_COMMUNITY): Payer: Self-pay | Admitting: Dentistry

## 2011-07-31 ENCOUNTER — Ambulatory Visit (HOSPITAL_COMMUNITY): Payer: Medicare Other | Admitting: Dentistry

## 2011-07-31 VITALS — BP 149/85 | HR 67 | Temp 97.2°F

## 2011-07-31 DIAGNOSIS — K08109 Complete loss of teeth, unspecified cause, unspecified class: Secondary | ICD-10-CM

## 2011-07-31 DIAGNOSIS — K1370 Unspecified lesions of oral mucosa: Secondary | ICD-10-CM

## 2011-07-31 DIAGNOSIS — K117 Disturbances of salivary secretion: Secondary | ICD-10-CM

## 2011-07-31 NOTE — Progress Notes (Signed)
Tuesday, July 31, 2011   BP: 149/85         P: 67                   T:   97.1  Crystal Browning presents for periodic oral exam and evaluation of dentures. Medical History.  Past Medical History  Diagnosis Date  . Hypertension   . Depression     History of Depression- Lexapro therapy  . TIA (transient ischemic attack)     History of TIA's with no residual effect  . Osteoporosis     History of Osteoporosis with one year of Actonel only  . Diverticulosis 02/2005    History of diverticulosis with admission from 8/18 - 03/04/2005  . 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 History of chemotherapy     S/P chemotherapy with Dr. Cleone Slim -2006  . Radiation     S/P radiation thrapy -06/2005  . Hypothyroid   No acute medical changes. Patient saw Dr. Pollyann Kennedy on 07/26/11. Patient is healthy. No evidence of recurrence. Allergies  Allergen Reactions  . Paxil Rash   Current Outpatient Prescriptions  Medication Sig Dispense Refill  . aspirin (ASPIRIN EC) 81 MG EC tablet Take 81 mg by mouth daily. Swallow whole.      . calcium carbonate (TUMS - DOSED IN MG ELEMENTAL CALCIUM) 500 MG chewable tablet Chew 1 tablet by mouth 2 (two) times daily.      Marland Kitchen escitalopram (LEXAPRO) 10 MG tablet Take 10 mg by mouth daily.      Marland Kitchen levothyroxine (SYNTHROID, LEVOTHROID) 100 MCG tablet Take by mouth daily.      . metoprolol succinate (TOPROL-XL) 50 MG 24 hr tablet Take 50 mg by mouth daily. Take with or immediately following a meal.      . ranitidine (ZANTAC) 75 MG tablet Take 75 mg by mouth.      . risedronate (ACTONEL) 35 MG tablet Take 35 mg by mouth every 7 (seven) days. with water on empty stomach, nothing by mouth or lie down for next 30 minutes.       CC: Pt. without complaints.  Exam: No lymphadenopathy. No acute TMJ symptoms. Edentulous . Moderate to severe xerostomia.  No denture irritation or erythema noted. Severe atrophy of  lower alveolar ridges as before. Upper and lower dentures are stable and retentive as best able consistent with severe atrophy. Procedure: Pressure indicting paste applied to dentures and adjustments made as needed. [place]. Occlusion adjusted as needed. [place]. Right posterior crossbite as before. Less than ideal occlusion but patient accepts results and is able to chew food without problems. RTC for recall in 12 months. Call if problems before then. Discussed new denture. Medicaid will not pay for new denture until May, 2017. Patient or daughter to call if new set of dentures wanted before then. Quote provided. Pateint dismiseed in stable condition.  Karoline Caldwell

## 2012-02-19 ENCOUNTER — Other Ambulatory Visit (HOSPITAL_COMMUNITY): Payer: Self-pay | Admitting: Family Medicine

## 2012-02-19 DIAGNOSIS — Z139 Encounter for screening, unspecified: Secondary | ICD-10-CM

## 2012-02-21 ENCOUNTER — Ambulatory Visit (HOSPITAL_COMMUNITY)
Admission: RE | Admit: 2012-02-21 | Discharge: 2012-02-21 | Disposition: A | Discharge status: Home / Self Care | Payer: Medicare Other | Source: Ambulatory Visit | Attending: Family Medicine | Admitting: Family Medicine

## 2012-02-21 ENCOUNTER — Other Ambulatory Visit (HOSPITAL_COMMUNITY): Payer: Self-pay | Admitting: Family Medicine

## 2012-02-21 DIAGNOSIS — C329 Malignant neoplasm of larynx, unspecified: Secondary | ICD-10-CM

## 2012-02-21 DIAGNOSIS — Z1231 Encounter for screening mammogram for malignant neoplasm of breast: Secondary | ICD-10-CM | POA: Insufficient documentation

## 2012-02-21 DIAGNOSIS — Z139 Encounter for screening, unspecified: Secondary | ICD-10-CM

## 2012-02-21 IMAGING — CR DG CHEST 2V
2 series · 2 of 2 positions shown · non-contrast
Comparison: [DATE]

CLINICAL DATA: Laryngeal carcinoma

CHEST - 2 VIEW

[view not recorded (1 of 2)]
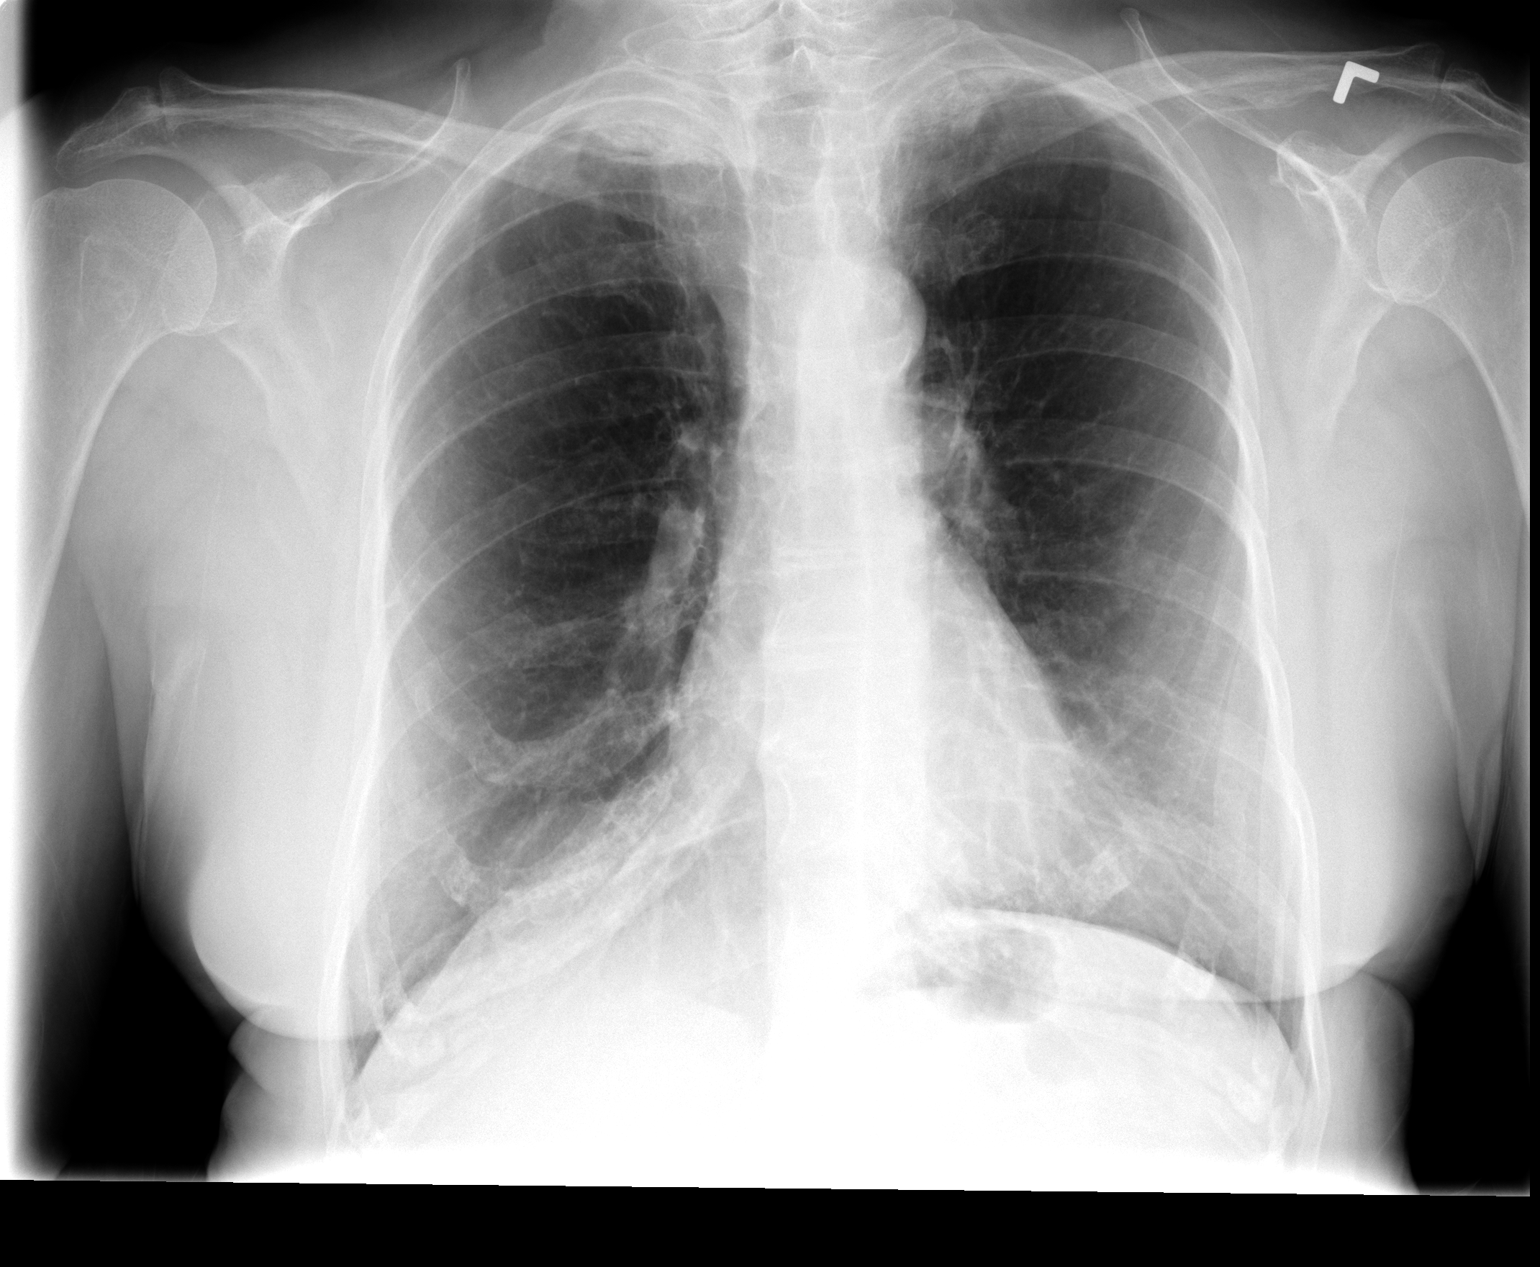

[view not recorded (2 of 2)]
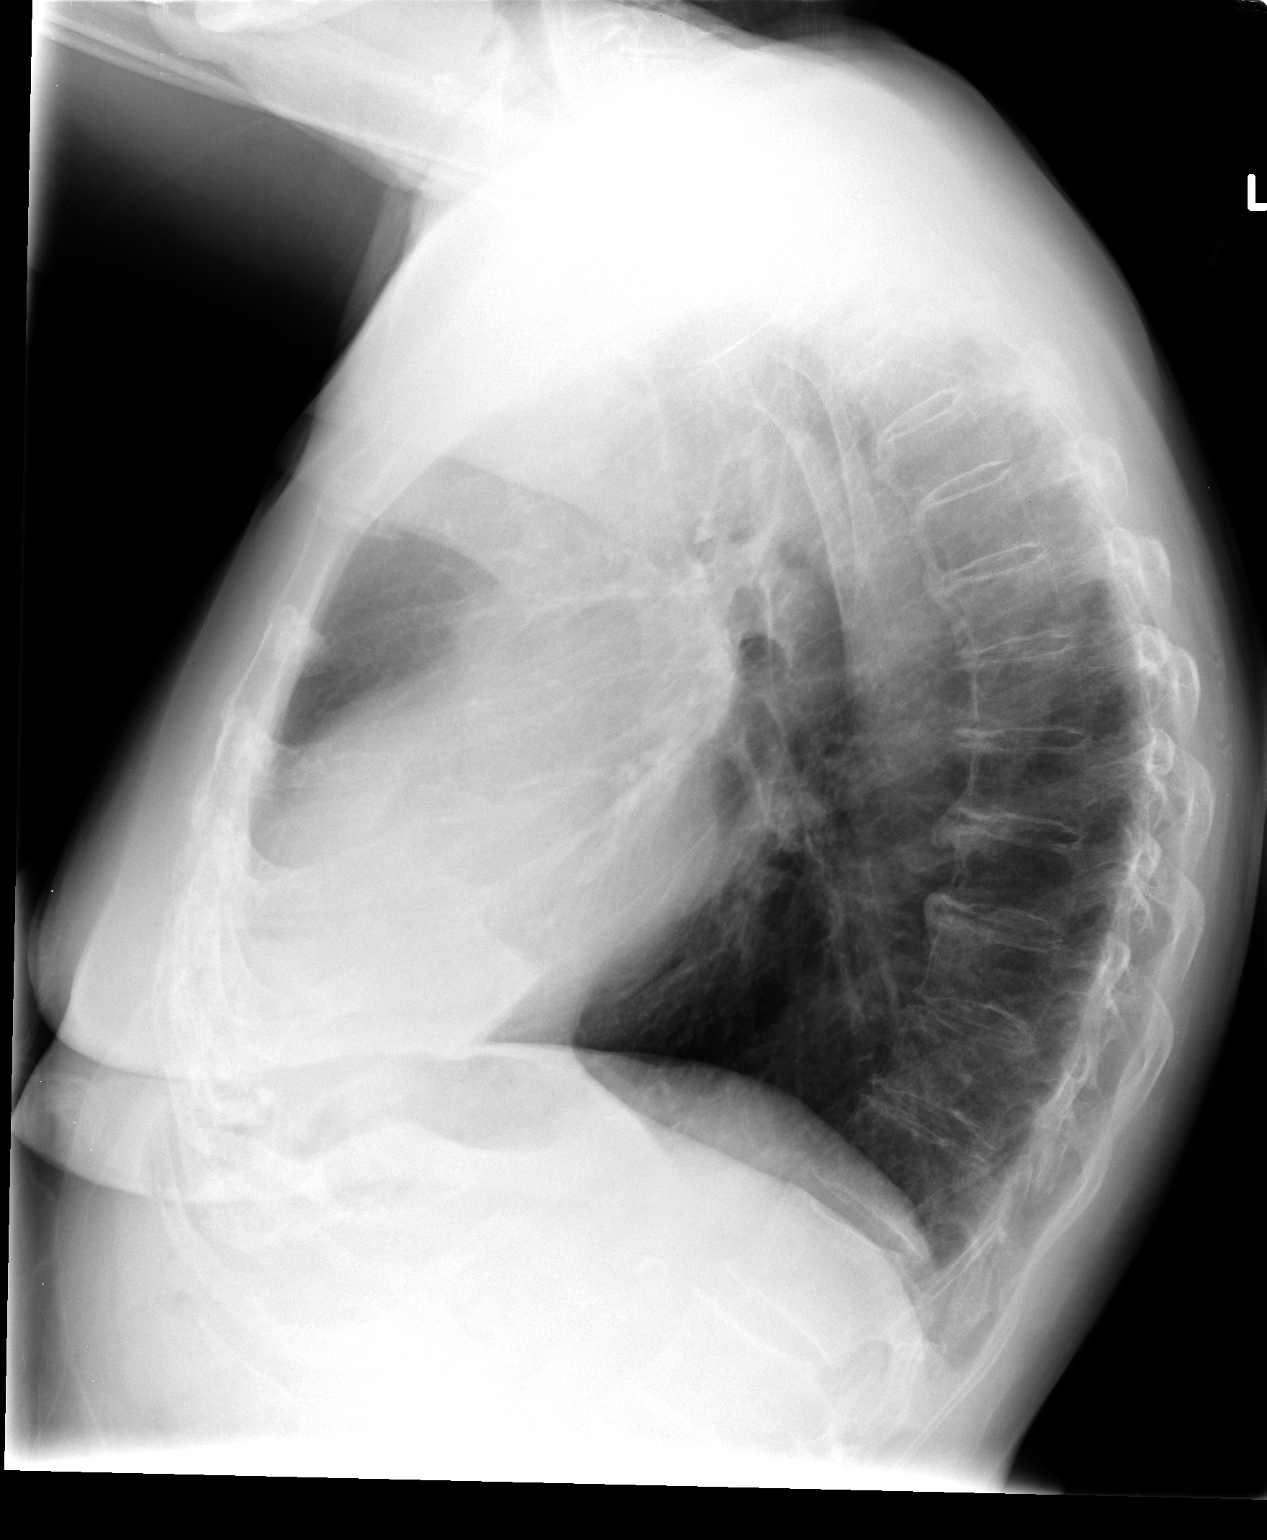

[2 of 2 positions shown; findings below may reference images not displayed]

FINDINGS: Changes of underlying COPD are evident and taking this
into consideration, heart size is mildly enlarged and stable.
Bilateral apical pleural parenchymal scarring and increased density
is seen and is stable.  This finding may be related to prior
radiation therapy port given the history of laryngeal carcinoma.
The lung fields demonstrate prominence of the interstitial markings
diffusely compatible with underlying bronchitic change.  No new
focal infiltrates or signs of congestive failure seen.  No pleural
fluid is noted.

Bony structures demonstrate degenerative changes of the thoracic
spine and are otherwise intact.
IMPRESSION: Stable chronic change and probable radiation change of the lung
apices.  No new focal or acute abnormality suggested

## 2012-08-06 ENCOUNTER — Ambulatory Visit (HOSPITAL_COMMUNITY): Payer: Medicaid - Dental | Admitting: Dentistry

## 2012-08-06 ENCOUNTER — Encounter (HOSPITAL_COMMUNITY): Payer: Self-pay | Admitting: Dentistry

## 2012-08-06 ENCOUNTER — Ambulatory Visit (HOSPITAL_COMMUNITY): Payer: Self-pay | Admitting: Dentistry

## 2012-08-06 VITALS — BP 138/71 | HR 69 | Temp 98.3°F

## 2012-08-06 DIAGNOSIS — Z8521 Personal history of malignant neoplasm of larynx: Secondary | ICD-10-CM

## 2012-08-06 DIAGNOSIS — Z463 Encounter for fitting and adjustment of dental prosthetic device: Secondary | ICD-10-CM

## 2012-08-06 DIAGNOSIS — K08109 Complete loss of teeth, unspecified cause, unspecified class: Secondary | ICD-10-CM

## 2012-08-06 DIAGNOSIS — K0889 Other specified disorders of teeth and supporting structures: Secondary | ICD-10-CM

## 2012-08-06 DIAGNOSIS — Z09 Encounter for follow-up examination after completed treatment for conditions other than malignant neoplasm: Secondary | ICD-10-CM

## 2012-08-06 DIAGNOSIS — K0820 Unspecified atrophy of edentulous alveolar ridge: Secondary | ICD-10-CM

## 2012-08-06 DIAGNOSIS — K117 Disturbances of salivary secretion: Secondary | ICD-10-CM

## 2012-08-06 NOTE — Progress Notes (Signed)
08/06/2012  Patient:            Crystal Browning Date of Birth:  1934-05-31 MRN:                [id number]  BP 138/71  Pulse 69  Temp 98.3 F (36.8 C) (Oral)   [name] presents for periodic oral exam and evaluation of upper and lower complete dentures. Patient saw Dr. Beverlee Nims last Friday with no evidence of cancer noted.  Medical History.  Past Medical History  Diagnosis Date  . Hypertension   . Depression     History of Depression- Lexapro therapy  . TIA (transient ischemic attack)     History of TIA's with no residual effect  . Osteoporosis     History of Osteoporosis with one year of Actonel only  . Diverticulosis 02/2005    History of diverticulosis with admission from 8/18 - 03/04/2005  . 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 History of chemotherapy     S/P chemotherapy with Dr. Cleone Slim -2006  . Radiation     S/P radiation thrapy -06/2005  . Hypothyroid     Allergies  Allergen Reactions  . Paroxetine Hcl Rash   Current Outpatient Prescriptions  Medication Sig Dispense Refill  . aspirin (ASPIRIN EC) 81 MG EC tablet Take 81 mg by mouth daily. Swallow whole.      . Calcium Carbonate-Vitamin D (CALCIUM + D PO) Take 1 capsule by mouth daily.      Marland Kitchen escitalopram (LEXAPRO) 10 MG tablet Take 10 mg by mouth daily.      Marland Kitchen levothyroxine (SYNTHROID, LEVOTHROID) 100 MCG tablet Take by mouth daily.      . metoprolol succinate (TOPROL-XL) 50 MG 24 hr tablet Take 50 mg by mouth daily. Take with or immediately following a meal.      . ranitidine (ZANTAC) 75 MG tablet Take 75 mg by mouth.       CC: Patient without complaints.   Exam: General: Patient is a well-developed, slightly built female in no acute distress. Head and neck: No lymphadenopathy. No acute TMJ symptoms. Intraoral exam: Edentulous . Moderate to severe xerostomia.  No denture irritation or erythema noted. Severe atrophy of  lower alveolar ridges as before. Prosthodontics: Upper and lower dentures are acceptable but upper denture is less stable. Patient had upper complete denture inserted in 2007 with reline procedures in October 2008. Patient should be eligible for upper and lower denture relines until new dentures area able to be remade in 2017. Procedure: Pressure indicting paste applied to dentures and adjustments made as needed. [place]. Occlusion adjusted as needed. [place]. Right posterior crossbite as before. Less than ideal occlusion, but patient accepts results and is able to chew food without problems. RTC for denture relines once prior approval received from [employer]. Call if problems before then. Patient dismissed in stable condition.   Karoline Caldwell

## 2012-08-06 NOTE — Patient Instructions (Addendum)
Return to clinic as scheduled for denture relines and recall. Will call patient for appointment once prior approval received. Call if problems arise before then. Dr. Kristin Bruins

## 2012-10-20 ENCOUNTER — Ambulatory Visit (HOSPITAL_COMMUNITY): Payer: Medicaid - Dental | Admitting: Dentistry

## 2012-10-20 ENCOUNTER — Encounter (HOSPITAL_COMMUNITY): Payer: Self-pay | Admitting: Dentistry

## 2012-10-20 VITALS — BP 145/79 | HR 68 | Temp 98.4°F | Ht 62.0 in | Wt 145.0 lb

## 2012-10-20 DIAGNOSIS — Z463 Encounter for fitting and adjustment of dental prosthetic device: Secondary | ICD-10-CM

## 2012-10-20 DIAGNOSIS — K0820 Unspecified atrophy of edentulous alveolar ridge: Secondary | ICD-10-CM

## 2012-10-20 DIAGNOSIS — Z972 Presence of dental prosthetic device (complete) (partial): Secondary | ICD-10-CM

## 2012-10-20 DIAGNOSIS — K08109 Complete loss of teeth, unspecified cause, unspecified class: Secondary | ICD-10-CM

## 2012-10-20 NOTE — Patient Instructions (Signed)
Return to clinic for insertion of upper lower complete denture relines as scheduled. Charlynne Pander, DDS

## 2012-10-20 NOTE — Progress Notes (Signed)
10/20/2012  Patient:            Crystal Browning Date of Birth:  Jan 22, 1934 MRN:                [id number]  BP 145/79  Pulse 68  Temp(Src) 98.4 F (36.9 C) (Oral)  Navdeep B Nokes presents for upper and lower complete denture reline impressions.  Patient with severe atrophy of the edentulous alveolar ridges. Patient and daughter are aware of limited prognosis for successful increase in retention and stability for the upper and lower complete dentures, but wish to proceed with reline procedures today. Procedure:  Upper and lower complete denture border molding and final impressions in Isofunctional compound . Patient tolerated procedure well. To [employer] for custom baseplates with rims. Return to clinic for upper and lower complete denture reline insertions as scheduled.  Charlynne Pander, DDS

## 2012-10-21 ENCOUNTER — Encounter (HOSPITAL_COMMUNITY): Payer: Self-pay | Admitting: Dentistry

## 2012-10-21 ENCOUNTER — Ambulatory Visit (HOSPITAL_COMMUNITY): Payer: Medicaid - Dental | Admitting: Dentistry

## 2012-10-21 VITALS — BP 137/71 | HR 67 | Temp 98.3°F

## 2012-10-21 DIAGNOSIS — K0889 Other specified disorders of teeth and supporting structures: Secondary | ICD-10-CM

## 2012-10-21 DIAGNOSIS — K0820 Unspecified atrophy of edentulous alveolar ridge: Secondary | ICD-10-CM

## 2012-10-21 DIAGNOSIS — Z972 Presence of dental prosthetic device (complete) (partial): Secondary | ICD-10-CM

## 2012-10-21 DIAGNOSIS — Z463 Encounter for fitting and adjustment of dental prosthetic device: Secondary | ICD-10-CM

## 2012-10-21 NOTE — Patient Instructions (Signed)
Instructions for Denture Use and Care  Congratulations, you are on the way to oral rehabilitation!  You have just received a new set of complete or partial dentures.  These prostheses will help to improve both your appearance and chewing ability.  These instructions will help you get adjusted to your dentures as well as care for them properly.  Please read these instructions carefully and completely as soon as you get home.  If you or your caregiver have any questions please notify the [facility] at [phone].  HOW YOUR DENTURES LOOK AND FEEL Soon after you begin wearing your dentures, you may feel that your dentures are too large or even loose.  As our mouth and facial muscles become accustomed to the dentures, these feelings will go away.  You also may feel that you are salivating more than you normally do.  This feeling should go away as you get used to having the dentures in your mouth.  You may bite your cheek or your tongue; this will eventually resolve itself as you wear your dentures.  Some soreness is to be expected, but you should not hurt.  If your mouth hurts, call your dentist.  A denture adhesive may occasionally be necessary to hold your dentures in place more securely.  The dentist will let you know when one is recommended for you.  SPEAKING Wearing dentures will change the sound of your voice initially.  This will be noticed by you more than anyone else.  Bite and swallow before you speak, in order to place your dentures in position so that you may speak more clearly.  Practice speaking by reading aloud or counting from 1 to 100 very slowly and distinctly.  After some practice your mouth will become accustomed to your dentures and you will speak more clearly.  EATING Chewing will definitely be different after you receive your dentures.  With a little practice and patience you should be able to eat just about any kind of food.  Begin by eating small quantities of food  that are cut into small pieces.  Star with soft foods such as eggs, cooked vegetables, or puddings.  As you gain confidence advance  Your diet to whatever texture foods you can tolerate.  DENTURE CARE Dentures can collect plaque and calculus much the same as natural teeth can.  If not removed on a regular basis, your dentures will not look or feel clean, and you will experience denture odor.  It is very important that you remove your dentures at bedtime and clean them thoroughly.  You should: 1. Clean your dentures over a sink full of water so if dropped, breakage will be prevented. 2. Rinse your dentures with cool water to remove any large food particles. 3. Use soap and water or a denture cleanser or paste to clean the dentures.  Do not use regular toothpaste as it may abrade the denture base or teeth. 4. Use a moistened denture brush to clean all surfaces (inside and outside). 5. Rinse thoroughly to remove any remaining soap or denture cleanser. 6. Use a soft bristle toothbrush to gently brush any natural teeth, gums, tongue, and palate at bedtime and before reinserting your dentures. 7. Do not sleep with your dentures in your mouth at night.  Remove your dentures and soak them overnight in a denture cup filled with water or denture solution as recommended by your dentist.  This routine will become second nature and will increase the life and comfort   of your dentures.  Please do not try to adjust these dentures yourself; you could damage them.  FOLLOW-UP You should call or make an appointment with your dentist.  Your dentist would like to see you at least once a year for a check-up and examination. 

## 2012-10-21 NOTE — Progress Notes (Signed)
10/21/2012  Patient:            Crystal Browning Date of Birth:  05-07-1934 MRN:                [id number]  BP 137/71  Pulse 67  Temp(Src) 98.3 F (36.8 C) (Oral)   Crystal Browning presents for insertion of upper and lower complete denture relines. Procedure: Pressure indicating paste applied to dentures. Adjustments made as needed. [place]. Occlusion evaluated and adjustments made as needed for Centric Relation and protrusive strokes. Good esthetics, phonetics, fit, and function noted. Patient accepts results. Post op instructions provided in written and verbal formats on use and care of dentures. Gave patient denture cup. Patient to keep dentures out if sore spots develop. Use salt water rinses as needed to aid healing. Return to clinic as scheduled for denture adjustment.  Call if problems arise before then. Patient dismissed in stable condition.  Charlynne Pander, DDS

## 2012-10-22 DIAGNOSIS — K08109 Complete loss of teeth, unspecified cause, unspecified class: Secondary | ICD-10-CM

## 2012-10-22 DIAGNOSIS — K0820 Unspecified atrophy of edentulous alveolar ridge: Secondary | ICD-10-CM

## 2012-10-22 DIAGNOSIS — Z449 Encounter for fitting and adjustment of unspecified external prosthetic device: Secondary | ICD-10-CM

## 2012-10-29 ENCOUNTER — Encounter (HOSPITAL_COMMUNITY): Payer: Self-pay | Admitting: Dentistry

## 2012-10-29 ENCOUNTER — Ambulatory Visit (HOSPITAL_COMMUNITY): Payer: Medicaid - Dental | Admitting: Dentistry

## 2012-10-29 VITALS — BP 146/73 | HR 66 | Temp 98.3°F

## 2012-10-29 DIAGNOSIS — K062 Gingival and edentulous alveolar ridge lesions associated with trauma: Secondary | ICD-10-CM

## 2012-10-29 DIAGNOSIS — Z463 Encounter for fitting and adjustment of dental prosthetic device: Secondary | ICD-10-CM

## 2012-10-29 DIAGNOSIS — Z972 Presence of dental prosthetic device (complete) (partial): Secondary | ICD-10-CM

## 2012-10-29 DIAGNOSIS — K08109 Complete loss of teeth, unspecified cause, unspecified class: Secondary | ICD-10-CM

## 2012-10-29 DIAGNOSIS — K117 Disturbances of salivary secretion: Secondary | ICD-10-CM

## 2012-10-29 DIAGNOSIS — K0820 Unspecified atrophy of edentulous alveolar ridge: Secondary | ICD-10-CM

## 2012-10-29 NOTE — Patient Instructions (Addendum)
Patient to keep dentures out if sore spots develop. Use salt water rinses as needed to aid healing. Return to clinic as scheduled for denture adjustment.   Call if problems arise before then. Dr. Malayshia All  

## 2012-10-29 NOTE — Progress Notes (Signed)
10/29/2012  Patient:            Crystal Browning Date of Birth:  [birth year] MRN:                [id number]  BP 146/73  Pulse 66  Temp(Src) 98.3 F (36.8 C) (Oral)   Mayling B Glandon presents for evaluation of recently inserted upper and lower complete denture relines. SUBJECTIVE: Patient is complaining of some irritation to the upper left alveolar ridge area #11. Patient indicates that she is able the without problems. No other problems with the lower denture. OBJECTIVE: No sign of denture irritation or erythema. Patient is edentulous. Xerostomia as before. Atrophy of edentulous alveolar ridges as before. Procedure: Pressure indicating paste applied to dentures. Adjustments made as needed. [place]. Occlusion evaluated and no adjustments were needed.  Patient accepts results of the denture adjustment.  Patient to keep dentures out if sore spots develop. Use salt water rinses as needed to aid healing. Return to clinic as scheduled for denture adjustment.  Call if problems arise before then. Patient dismissed in stable condition.  Charlynne Pander, DDS

## 2012-11-26 ENCOUNTER — Encounter (HOSPITAL_COMMUNITY): Payer: Self-pay | Admitting: Dentistry

## 2012-11-26 ENCOUNTER — Ambulatory Visit (HOSPITAL_COMMUNITY): Payer: Medicaid - Dental | Admitting: Dentistry

## 2012-11-26 VITALS — BP 128/73 | HR 68 | Temp 98.5°F

## 2012-11-26 DIAGNOSIS — K08109 Complete loss of teeth, unspecified cause, unspecified class: Secondary | ICD-10-CM

## 2012-11-26 DIAGNOSIS — K0820 Unspecified atrophy of edentulous alveolar ridge: Secondary | ICD-10-CM

## 2012-11-26 DIAGNOSIS — Z972 Presence of dental prosthetic device (complete) (partial): Secondary | ICD-10-CM

## 2012-11-26 DIAGNOSIS — Z463 Encounter for fitting and adjustment of dental prosthetic device: Secondary | ICD-10-CM

## 2012-11-26 NOTE — Progress Notes (Signed)
11/26/2012  Patient:            Crystal Browning Date of Birth:  12/30/1933 MRN:                [id number]  BP 128/73  Pulse 68  Temp(Src) 98.5 F (36.9 C) (Oral)   Kittie B Achille presents for evaluation of recently inserted upper and lower complete denture relines.  SUBJECTIVE: Patient is complaining of a history of previous denture irritation, but "none now". Patient indicates that she is able eat without problems. No other problems with the lower denture. OBJECTIVE: No sign of denture irritation or erythema. Patient is edentulous. Xerostomia as before. Atrophy of edentulous alveolar ridges as before. Procedure: Pressure indicating paste applied to dentures. Adjustments made as needed. [place]. Occlusion evaluated and no adjustments were needed.  Right-sided crossbite as before. Patient accepts results of the denture adjustment.  Patient to keep dentures out if sore spots develop. Use salt water rinses as needed to aid healing. Return to clinic as scheduled for denture adjustment.  Call if problems arise before then. Patient dismissed in stable condition.  Charlynne Pander, DDS

## 2012-11-26 NOTE — Patient Instructions (Signed)
Return to clinic as scheduled for a denture adjustment. Patient to call if problems arise before then. Patient keep dentures out if sore spots arise. Patient use salt water rinses as needed to aid healing. Charlynne Pander, DDS

## 2013-02-20 ENCOUNTER — Other Ambulatory Visit (HOSPITAL_COMMUNITY): Payer: Self-pay | Admitting: Family Medicine

## 2013-02-20 DIAGNOSIS — M810 Age-related osteoporosis without current pathological fracture: Secondary | ICD-10-CM

## 2013-02-20 DIAGNOSIS — M40209 Unspecified kyphosis, site unspecified: Secondary | ICD-10-CM

## 2013-02-24 ENCOUNTER — Other Ambulatory Visit (HOSPITAL_COMMUNITY): Payer: Self-pay

## 2013-02-26 ENCOUNTER — Ambulatory Visit (HOSPITAL_COMMUNITY)
Admission: RE | Admit: 2013-02-26 | Discharge: 2013-02-26 | Disposition: A | Discharge status: Home / Self Care | Payer: Medicare Other | Source: Ambulatory Visit | Attending: Family Medicine | Admitting: Family Medicine

## 2013-02-26 DIAGNOSIS — M810 Age-related osteoporosis without current pathological fracture: Secondary | ICD-10-CM

## 2013-02-26 DIAGNOSIS — M949 Disorder of cartilage, unspecified: Secondary | ICD-10-CM | POA: Insufficient documentation

## 2013-02-26 DIAGNOSIS — M40209 Unspecified kyphosis, site unspecified: Secondary | ICD-10-CM

## 2013-02-26 DIAGNOSIS — M899 Disorder of bone, unspecified: Secondary | ICD-10-CM | POA: Insufficient documentation

## 2013-02-26 DIAGNOSIS — M818 Other osteoporosis without current pathological fracture: Secondary | ICD-10-CM | POA: Insufficient documentation

## 2013-05-20 ENCOUNTER — Ambulatory Visit (HOSPITAL_COMMUNITY): Payer: Medicare Other | Admitting: Dentistry

## 2013-05-27 ENCOUNTER — Ambulatory Visit (HOSPITAL_COMMUNITY): Payer: Medicare Other | Admitting: Dentistry

## 2013-05-27 ENCOUNTER — Encounter (INDEPENDENT_AMBULATORY_CARE_PROVIDER_SITE_OTHER): Payer: Self-pay

## 2013-05-27 ENCOUNTER — Encounter (HOSPITAL_COMMUNITY): Payer: Self-pay | Admitting: Dentistry

## 2013-05-27 VITALS — BP 151/65 | HR 68 | Temp 98.5°F

## 2013-05-27 DIAGNOSIS — K08109 Complete loss of teeth, unspecified cause, unspecified class: Secondary | ICD-10-CM

## 2013-05-27 DIAGNOSIS — K0820 Unspecified atrophy of edentulous alveolar ridge: Secondary | ICD-10-CM

## 2013-05-27 DIAGNOSIS — M264 Malocclusion, unspecified: Secondary | ICD-10-CM

## 2013-05-27 DIAGNOSIS — Z463 Encounter for fitting and adjustment of dental prosthetic device: Secondary | ICD-10-CM

## 2013-05-27 NOTE — Progress Notes (Signed)
05/27/2013  Patient:            Crystal Browning Date of Birth:  Mar 23, 1934 MRN:                [id number]  BP 151/65  Pulse 68  Temp(Src) 98.5 F (36.9 C) (Oral)   Marie Borowski presents for periodic oral exam and evaluation of upper and lower complete dentures. Patient denies having acute medical changes since last visit.  Medical History.  Past Medical History  Diagnosis Date  . Hypertension   . Depression     History of Depression- Lexapro therapy  . TIA (transient ischemic attack)     History of TIA's with no residual effect  . Osteoporosis     History of Osteoporosis with one year of Actonel only  . Diverticulosis 02/2005    History of diverticulosis with admission from 8/18 - 03/04/2005  . 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 History of chemotherapy     S/P chemotherapy with Dr. Cleone Slim -2006  . Radiation     S/P radiation thrapy -06/2005  . Hypothyroid     Allergies  Allergen Reactions  . Paroxetine Hcl Rash   Current Outpatient Prescriptions  Medication Sig Dispense Refill  . aspirin (ASPIRIN EC) 81 MG EC tablet Take 81 mg by mouth daily. Swallow whole.      . Calcium Carbonate-Vitamin D (CALCIUM + D PO) Take 1 capsule by mouth daily.      Marland Kitchen escitalopram (LEXAPRO) 10 MG tablet Take 10 mg by mouth daily.      Marland Kitchen levothyroxine (SYNTHROID, LEVOTHROID) 100 MCG tablet Take by mouth daily.      . metoprolol succinate (TOPROL-XL) 50 MG 24 hr tablet Take 50 mg by mouth daily. Take with or immediately following a meal.      . ranitidine (ZANTAC) 75 MG tablet Take 75 mg by mouth.       No current facility-administered medications for this visit.   CC: Patient without complaints.   Exam: General: Patient is a well-developed, slightly built female in no acute distress. Head and neck: No lymphadenopathy. No acute TMJ symptoms. Intraoral exam: Edentulous . Moderate xerostomia.  No denture  irritation or erythema noted. 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 Prosthodontics: Upper and lower dentures are stable with less than ideal retention consistent with atrophy of alveolar ridges. Patient had upper and lower complete denture relines inserted on 10/21/2012.  Procedure: Pressure indicting paste applied to dentures and adjustments made as needed. [place]. Occlusion adjusted as needed. [place]. Right posterior crossbite as before. Less than ideal occlusion, but patient accepts results and is able to chew food without problems. RTC for denture recall in one year. Patient to call is sore spots arise. Patient to use salt water rinses as needed to aid healing. Patient dismissed in stable condition.   Karoline Caldwell

## 2013-05-27 NOTE — Patient Instructions (Signed)
RTC for denture recall in one year. Patient to call is sore spots arise.  Patient to use salt water rinses as needed to aid healing. Dr. Kristin Bruins

## 2013-07-23 DIAGNOSIS — K117 Disturbances of salivary secretion: Secondary | ICD-10-CM | POA: Diagnosis not present

## 2013-07-23 DIAGNOSIS — Z8521 Personal history of malignant neoplasm of larynx: Secondary | ICD-10-CM | POA: Diagnosis not present

## 2013-08-12 DIAGNOSIS — H2589 Other age-related cataract: Secondary | ICD-10-CM | POA: Diagnosis not present

## 2013-08-12 DIAGNOSIS — H02409 Unspecified ptosis of unspecified eyelid: Secondary | ICD-10-CM | POA: Diagnosis not present

## 2013-08-12 DIAGNOSIS — H526 Other disorders of refraction: Secondary | ICD-10-CM | POA: Diagnosis not present

## 2013-08-12 DIAGNOSIS — H43819 Vitreous degeneration, unspecified eye: Secondary | ICD-10-CM | POA: Diagnosis not present

## 2013-08-19 DIAGNOSIS — H2510 Age-related nuclear cataract, unspecified eye: Secondary | ICD-10-CM | POA: Diagnosis not present

## 2013-08-28 DIAGNOSIS — H2589 Other age-related cataract: Secondary | ICD-10-CM | POA: Diagnosis not present

## 2013-08-28 DIAGNOSIS — Z01818 Encounter for other preprocedural examination: Secondary | ICD-10-CM | POA: Diagnosis not present

## 2013-09-03 DIAGNOSIS — E039 Hypothyroidism, unspecified: Secondary | ICD-10-CM | POA: Diagnosis not present

## 2013-09-03 DIAGNOSIS — Z8521 Personal history of malignant neoplasm of larynx: Secondary | ICD-10-CM | POA: Diagnosis not present

## 2013-09-03 DIAGNOSIS — Z79899 Other long term (current) drug therapy: Secondary | ICD-10-CM | POA: Diagnosis not present

## 2013-09-03 DIAGNOSIS — H2589 Other age-related cataract: Secondary | ICD-10-CM

## 2013-09-03 DIAGNOSIS — F329 Major depressive disorder, single episode, unspecified: Secondary | ICD-10-CM | POA: Diagnosis not present

## 2013-09-03 DIAGNOSIS — I252 Old myocardial infarction: Secondary | ICD-10-CM | POA: Diagnosis not present

## 2013-09-03 DIAGNOSIS — Z8673 Personal history of transient ischemic attack (TIA), and cerebral infarction without residual deficits: Secondary | ICD-10-CM | POA: Diagnosis not present

## 2013-09-03 DIAGNOSIS — F3289 Other specified depressive episodes: Secondary | ICD-10-CM | POA: Diagnosis not present

## 2013-09-03 DIAGNOSIS — Z809 Family history of malignant neoplasm, unspecified: Secondary | ICD-10-CM | POA: Diagnosis not present

## 2013-09-03 DIAGNOSIS — Z90710 Acquired absence of both cervix and uterus: Secondary | ICD-10-CM | POA: Diagnosis not present

## 2013-09-03 DIAGNOSIS — K5732 Diverticulitis of large intestine without perforation or abscess without bleeding: Secondary | ICD-10-CM | POA: Diagnosis not present

## 2013-09-03 DIAGNOSIS — Z923 Personal history of irradiation: Secondary | ICD-10-CM | POA: Diagnosis not present

## 2013-09-03 DIAGNOSIS — Z9079 Acquired absence of other genital organ(s): Secondary | ICD-10-CM | POA: Diagnosis not present

## 2013-09-03 DIAGNOSIS — Z9221 Personal history of antineoplastic chemotherapy: Secondary | ICD-10-CM | POA: Diagnosis not present

## 2013-09-03 DIAGNOSIS — K449 Diaphragmatic hernia without obstruction or gangrene: Secondary | ICD-10-CM | POA: Diagnosis not present

## 2013-09-03 DIAGNOSIS — Z7982 Long term (current) use of aspirin: Secondary | ICD-10-CM | POA: Diagnosis not present

## 2013-09-03 DIAGNOSIS — K219 Gastro-esophageal reflux disease without esophagitis: Secondary | ICD-10-CM | POA: Diagnosis not present

## 2013-09-03 DIAGNOSIS — H2510 Age-related nuclear cataract, unspecified eye: Secondary | ICD-10-CM | POA: Diagnosis not present

## 2013-09-03 DIAGNOSIS — Z83511 Family history of glaucoma: Secondary | ICD-10-CM | POA: Diagnosis not present

## 2013-09-03 DIAGNOSIS — Z823 Family history of stroke: Secondary | ICD-10-CM | POA: Diagnosis not present

## 2013-09-03 DIAGNOSIS — Z8249 Family history of ischemic heart disease and other diseases of the circulatory system: Secondary | ICD-10-CM | POA: Diagnosis not present

## 2013-09-03 DIAGNOSIS — I10 Essential (primary) hypertension: Secondary | ICD-10-CM | POA: Diagnosis not present

## 2013-09-03 HISTORY — DX: Other age-related cataract: H25.89

## 2013-09-17 DIAGNOSIS — I10 Essential (primary) hypertension: Secondary | ICD-10-CM | POA: Diagnosis not present

## 2013-09-17 DIAGNOSIS — K219 Gastro-esophageal reflux disease without esophagitis: Secondary | ICD-10-CM | POA: Diagnosis not present

## 2013-09-17 DIAGNOSIS — C8379 Burkitt lymphoma, extranodal and solid organ sites: Secondary | ICD-10-CM | POA: Diagnosis not present

## 2013-09-17 DIAGNOSIS — Z8512 Personal history of malignant neoplasm of trachea: Secondary | ICD-10-CM | POA: Diagnosis not present

## 2013-09-17 DIAGNOSIS — K449 Diaphragmatic hernia without obstruction or gangrene: Secondary | ICD-10-CM | POA: Diagnosis not present

## 2013-09-17 DIAGNOSIS — Z9889 Other specified postprocedural states: Secondary | ICD-10-CM | POA: Diagnosis not present

## 2013-09-17 DIAGNOSIS — H2589 Other age-related cataract: Secondary | ICD-10-CM | POA: Diagnosis not present

## 2013-09-17 DIAGNOSIS — Z79899 Other long term (current) drug therapy: Secondary | ICD-10-CM | POA: Diagnosis not present

## 2013-09-17 DIAGNOSIS — F3289 Other specified depressive episodes: Secondary | ICD-10-CM | POA: Diagnosis not present

## 2013-09-17 DIAGNOSIS — Z888 Allergy status to other drugs, medicaments and biological substances status: Secondary | ICD-10-CM | POA: Diagnosis not present

## 2013-09-17 DIAGNOSIS — F329 Major depressive disorder, single episode, unspecified: Secondary | ICD-10-CM | POA: Diagnosis not present

## 2013-09-17 DIAGNOSIS — Z7982 Long term (current) use of aspirin: Secondary | ICD-10-CM | POA: Diagnosis not present

## 2013-09-17 DIAGNOSIS — Z8673 Personal history of transient ischemic attack (TIA), and cerebral infarction without residual deficits: Secondary | ICD-10-CM | POA: Diagnosis not present

## 2013-09-17 DIAGNOSIS — I252 Old myocardial infarction: Secondary | ICD-10-CM | POA: Diagnosis not present

## 2013-09-17 DIAGNOSIS — H2510 Age-related nuclear cataract, unspecified eye: Secondary | ICD-10-CM | POA: Diagnosis not present

## 2013-09-17 DIAGNOSIS — E039 Hypothyroidism, unspecified: Secondary | ICD-10-CM | POA: Diagnosis not present

## 2013-12-14 DIAGNOSIS — L259 Unspecified contact dermatitis, unspecified cause: Secondary | ICD-10-CM | POA: Diagnosis not present

## 2014-05-27 ENCOUNTER — Ambulatory Visit (HOSPITAL_COMMUNITY): Payer: Medicare Other | Admitting: Dentistry

## 2014-05-31 ENCOUNTER — Encounter (INDEPENDENT_AMBULATORY_CARE_PROVIDER_SITE_OTHER): Payer: Self-pay

## 2014-05-31 ENCOUNTER — Ambulatory Visit (HOSPITAL_COMMUNITY): Payer: Medicaid - Dental | Admitting: Dentistry

## 2014-05-31 ENCOUNTER — Encounter (HOSPITAL_COMMUNITY): Payer: Self-pay | Admitting: Dentistry

## 2014-05-31 VITALS — BP 172/65 | HR 69 | Temp 97.7°F

## 2014-05-31 DIAGNOSIS — K0820 Unspecified atrophy of edentulous alveolar ridge: Secondary | ICD-10-CM

## 2014-05-31 DIAGNOSIS — R682 Dry mouth, unspecified: Secondary | ICD-10-CM

## 2014-05-31 DIAGNOSIS — K117 Disturbances of salivary secretion: Secondary | ICD-10-CM

## 2014-05-31 DIAGNOSIS — M264 Malocclusion, unspecified: Secondary | ICD-10-CM

## 2014-05-31 DIAGNOSIS — Z463 Encounter for fitting and adjustment of dental prosthetic device: Secondary | ICD-10-CM

## 2014-05-31 DIAGNOSIS — K08109 Complete loss of teeth, unspecified cause, unspecified class: Secondary | ICD-10-CM

## 2014-05-31 DIAGNOSIS — Z972 Presence of dental prosthetic device (complete) (partial): Secondary | ICD-10-CM

## 2014-05-31 DIAGNOSIS — Z418 Encounter for other procedures for purposes other than remedying health state: Secondary | ICD-10-CM

## 2014-05-31 NOTE — Patient Instructions (Signed)
Keep dentures out if sore spots arise. Use salt water rinses as needed to aid healing. Return to clinic as scheduled or call if problems arise before then. Dr. Kulinski 

## 2014-05-31 NOTE — Progress Notes (Signed)
05/31/2014  Patient:            Crystal Browning Date of Birth:  Jan 14, 1934 MRN:                [id number]  BP 172/65 mmHg  Pulse 69  Temp(Src) 97.7 F (36.5 C) (Oral)   Crystal Browning presents for periodic oral exam and evaluation of upper and lower complete dentures. Patient has had recent cataract surgeries to both eyes. Patient denies any other health problems.  Medical History.  Past Medical History  Diagnosis Date  . Hypertension   . Depression     History of Depression- Lexapro therapy  . TIA (transient ischemic attack)     History of TIA's with no residual effect  . Osteoporosis     History of Osteoporosis with one year of Actonel only  . Diverticulosis 02/2005    History of diverticulosis with admission from 8/18 - 03/04/2005  . 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 History of chemotherapy     S/P chemotherapy with Dr. Sonny Browning -2006  . Radiation     S/P radiation thrapy -06/2005  . Hypothyroid     Allergies  Allergen Reactions  . Paroxetine Hcl Rash   Current Outpatient Prescriptions  Medication Sig Dispense Refill  . aspirin (ASPIRIN EC) 81 MG EC tablet Take 81 mg by mouth daily. Swallow whole.    . Calcium Carbonate-Vitamin D (CALCIUM + D PO) Take 1 capsule by mouth daily.    Marland Kitchen escitalopram (LEXAPRO) 10 MG tablet Take 10 mg by mouth daily.    Marland Kitchen levothyroxine (SYNTHROID, LEVOTHROID) 100 MCG tablet Take by mouth daily.    . metoprolol succinate (TOPROL-XL) 50 MG 24 hr tablet Take 50 mg by mouth daily. Take with or immediately following a meal.    . ranitidine (ZANTAC) 75 MG tablet Take 75 mg by mouth.     No current facility-administered medications for this visit.   CC: Patient without complaints.   Exam: General: Patient is a well-developed, slightly built female in no acute distress. Head and neck: No lymphadenopathy. No acute TMJ symptoms. Intraoral exam: Edentulous . Moderate  xerostomia.  No denture irritation or erythema noted. 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 Prosthodontics: Upper and lower dentures are stable with less than ideal retention consistent with atrophy of alveolar ridges. Patient had upper and lower complete denture relines inserted on 10/21/2012.  Procedure: Pressure indicting paste applied to dentures and adjustments made as needed. [place] Occlusion adjusted as needed. [place] Right posterior crossbite as before. Less than ideal occlusion, but patient accepts results and is able to chew food without problems. RTC for denture recall in one year. Patient to call is sore spots arise.  Patient to use salt water rinses as needed to aid healing. Patient dismissed in stable condition.   Crystal Browning

## 2014-06-26 ENCOUNTER — Emergency Department (HOSPITAL_COMMUNITY): Payer: Medicare Other

## 2014-06-26 ENCOUNTER — Encounter (HOSPITAL_COMMUNITY): Payer: Self-pay | Admitting: Cardiology

## 2014-06-26 ENCOUNTER — Emergency Department (HOSPITAL_COMMUNITY)
Admission: EM | Admit: 2014-06-26 | Discharge: 2014-06-26 | Disposition: A | Discharge status: Home / Self Care | Payer: Medicare Other | Attending: Emergency Medicine | Admitting: Emergency Medicine

## 2014-06-26 DIAGNOSIS — I10 Essential (primary) hypertension: Secondary | ICD-10-CM | POA: Diagnosis not present

## 2014-06-26 DIAGNOSIS — R059 Cough, unspecified: Secondary | ICD-10-CM

## 2014-06-26 DIAGNOSIS — Z85818 Personal history of malignant neoplasm of other sites of lip, oral cavity, and pharynx: Secondary | ICD-10-CM | POA: Diagnosis not present

## 2014-06-26 DIAGNOSIS — Z8673 Personal history of transient ischemic attack (TIA), and cerebral infarction without residual deficits: Secondary | ICD-10-CM | POA: Diagnosis not present

## 2014-06-26 DIAGNOSIS — Z79899 Other long term (current) drug therapy: Secondary | ICD-10-CM | POA: Insufficient documentation

## 2014-06-26 DIAGNOSIS — Z923 Personal history of irradiation: Secondary | ICD-10-CM | POA: Insufficient documentation

## 2014-06-26 DIAGNOSIS — Z8739 Personal history of other diseases of the musculoskeletal system and connective tissue: Secondary | ICD-10-CM | POA: Diagnosis not present

## 2014-06-26 DIAGNOSIS — E039 Hypothyroidism, unspecified: Secondary | ICD-10-CM | POA: Diagnosis not present

## 2014-06-26 DIAGNOSIS — Z7982 Long term (current) use of aspirin: Secondary | ICD-10-CM | POA: Diagnosis not present

## 2014-06-26 DIAGNOSIS — R531 Weakness: Secondary | ICD-10-CM | POA: Diagnosis not present

## 2014-06-26 DIAGNOSIS — R05 Cough: Secondary | ICD-10-CM | POA: Insufficient documentation

## 2014-06-26 DIAGNOSIS — R51 Headache: Secondary | ICD-10-CM | POA: Diagnosis not present

## 2014-06-26 DIAGNOSIS — Z8719 Personal history of other diseases of the digestive system: Secondary | ICD-10-CM | POA: Diagnosis not present

## 2014-06-26 DIAGNOSIS — F329 Major depressive disorder, single episode, unspecified: Secondary | ICD-10-CM | POA: Insufficient documentation

## 2014-06-26 LAB — URINALYSIS, ROUTINE W REFLEX MICROSCOPIC
Bilirubin Urine: NEGATIVE
GLUCOSE, UA: NEGATIVE mg/dL
Ketones, ur: NEGATIVE mg/dL
LEUKOCYTES UA: NEGATIVE
Nitrite: NEGATIVE
PH: 6 (ref 5.0–8.0)
Protein, ur: NEGATIVE mg/dL
Specific Gravity, Urine: 1.02 (ref 1.005–1.030)
Urobilinogen, UA: 0.2 mg/dL (ref 0.0–1.0)

## 2014-06-26 LAB — CBC WITH DIFFERENTIAL/PLATELET
BASOS ABS: 0 10*3/uL (ref 0.0–0.1)
Basophils Relative: 0 % (ref 0–1)
Eosinophils Absolute: 0.2 10*3/uL (ref 0.0–0.7)
Eosinophils Relative: 4 % (ref 0–5)
HEMATOCRIT: 35.6 % — AB (ref 36.0–46.0)
HEMOGLOBIN: 12 g/dL (ref 12.0–15.0)
LYMPHS PCT: 22 % (ref 12–46)
Lymphs Abs: 1.1 10*3/uL (ref 0.7–4.0)
MCH: 31.6 pg (ref 26.0–34.0)
MCHC: 33.7 g/dL (ref 30.0–36.0)
MCV: 93.7 fL (ref 78.0–100.0)
MONO ABS: 0.6 10*3/uL (ref 0.1–1.0)
Monocytes Relative: 11 % (ref 3–12)
NEUTROS ABS: 3.1 10*3/uL (ref 1.7–7.7)
Neutrophils Relative %: 63 % (ref 43–77)
Platelets: 217 10*3/uL (ref 150–400)
RBC: 3.8 MIL/uL — ABNORMAL LOW (ref 3.87–5.11)
RDW: 13.1 % (ref 11.5–15.5)
WBC: 5 10*3/uL (ref 4.0–10.5)

## 2014-06-26 LAB — COMPREHENSIVE METABOLIC PANEL
ALK PHOS: 76 U/L (ref 39–117)
ALT: 12 U/L (ref 0–35)
AST: 22 U/L (ref 0–37)
Albumin: 4 g/dL (ref 3.5–5.2)
Anion gap: 14 (ref 5–15)
BILIRUBIN TOTAL: 0.2 mg/dL — AB (ref 0.3–1.2)
BUN: 10 mg/dL (ref 6–23)
CHLORIDE: 101 meq/L (ref 96–112)
CO2: 24 meq/L (ref 19–32)
CREATININE: 1.11 mg/dL — AB (ref 0.50–1.10)
Calcium: 9.5 mg/dL (ref 8.4–10.5)
GFR calc Af Amer: 54 mL/min — ABNORMAL LOW (ref >90)
GFR, EST NON AFRICAN AMERICAN: 46 mL/min — AB (ref >90)
Glucose, Bld: 101 mg/dL — ABNORMAL HIGH (ref 70–99)
POTASSIUM: 3.8 meq/L (ref 3.7–5.3)
Sodium: 139 mEq/L (ref 137–147)
Total Protein: 7.4 g/dL (ref 6.0–8.3)

## 2014-06-26 LAB — TROPONIN I

## 2014-06-26 LAB — URINE MICROSCOPIC-ADD ON

## 2014-06-26 IMAGING — CR DG CHEST 2V
2 series · 2 of 2 positions shown · non-contrast
Comparison: [DATE].

CLINICAL DATA: Cough.  Weakness.

EXAM:
CHEST  2 VIEW

[view not recorded (1 of 2)]
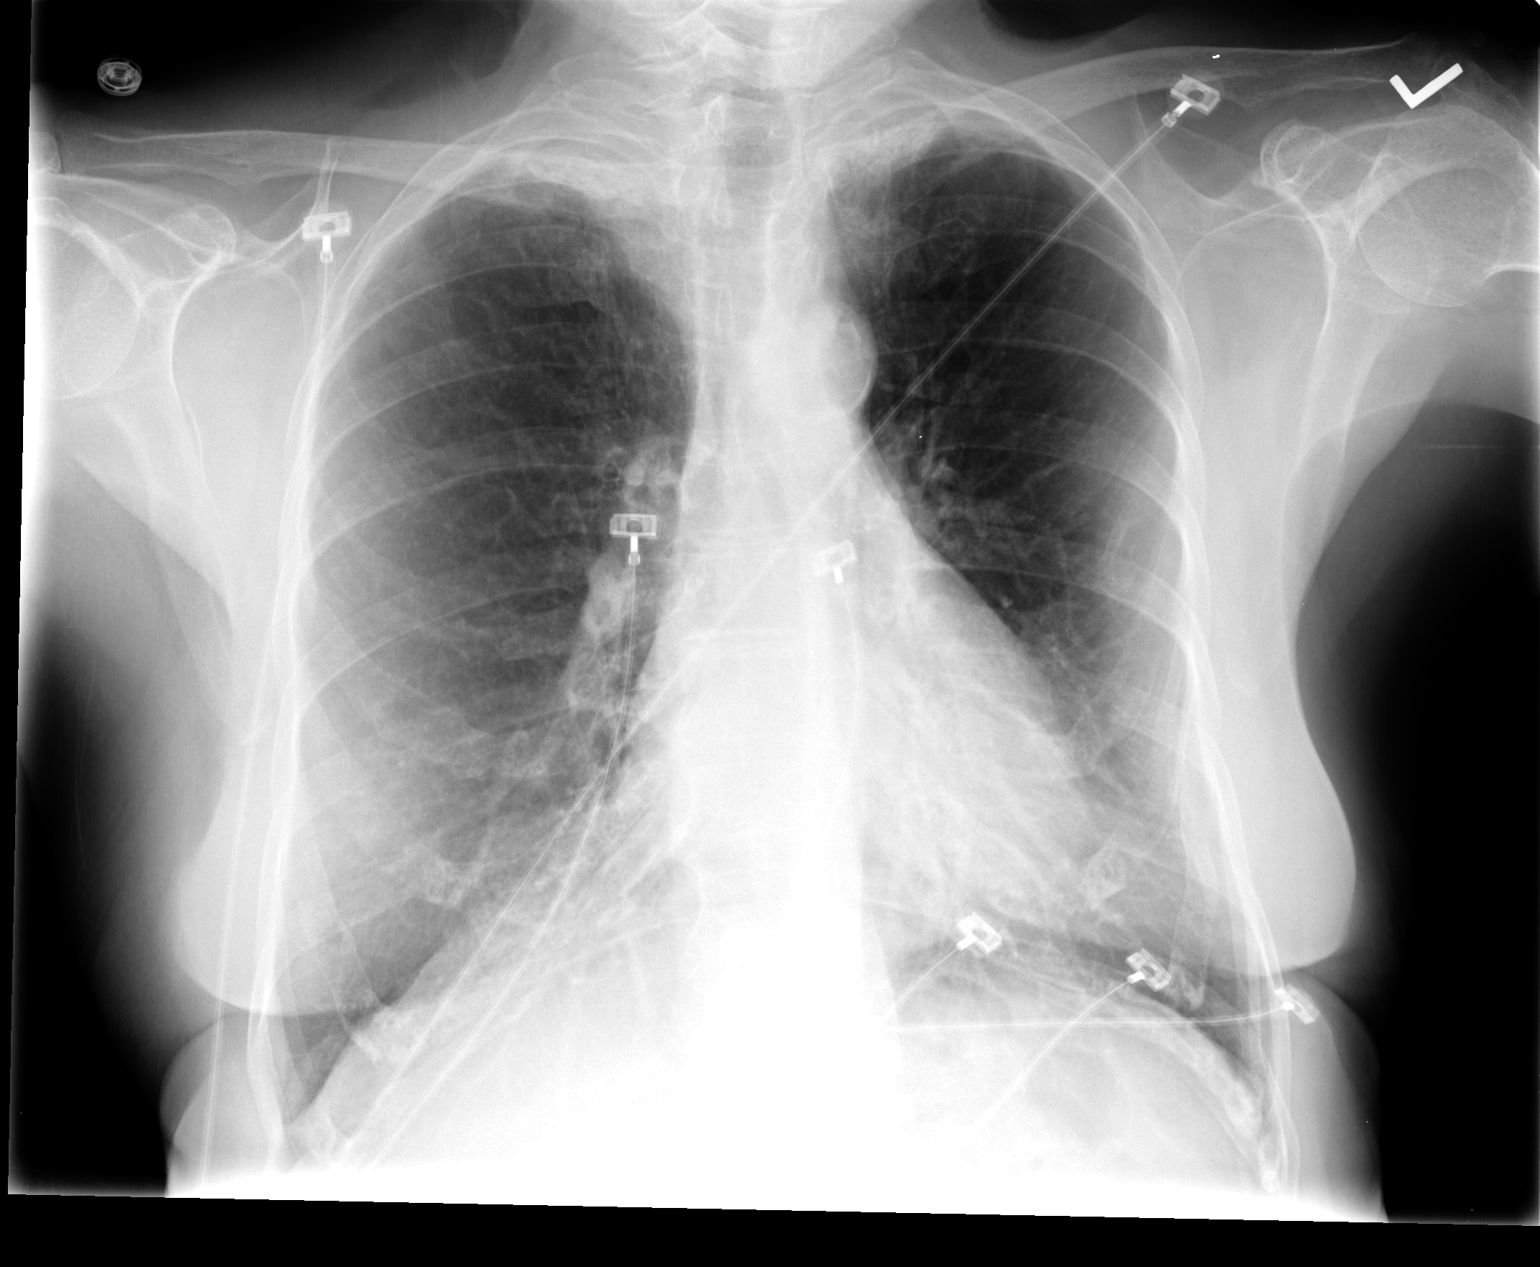

[view not recorded (2 of 2)]
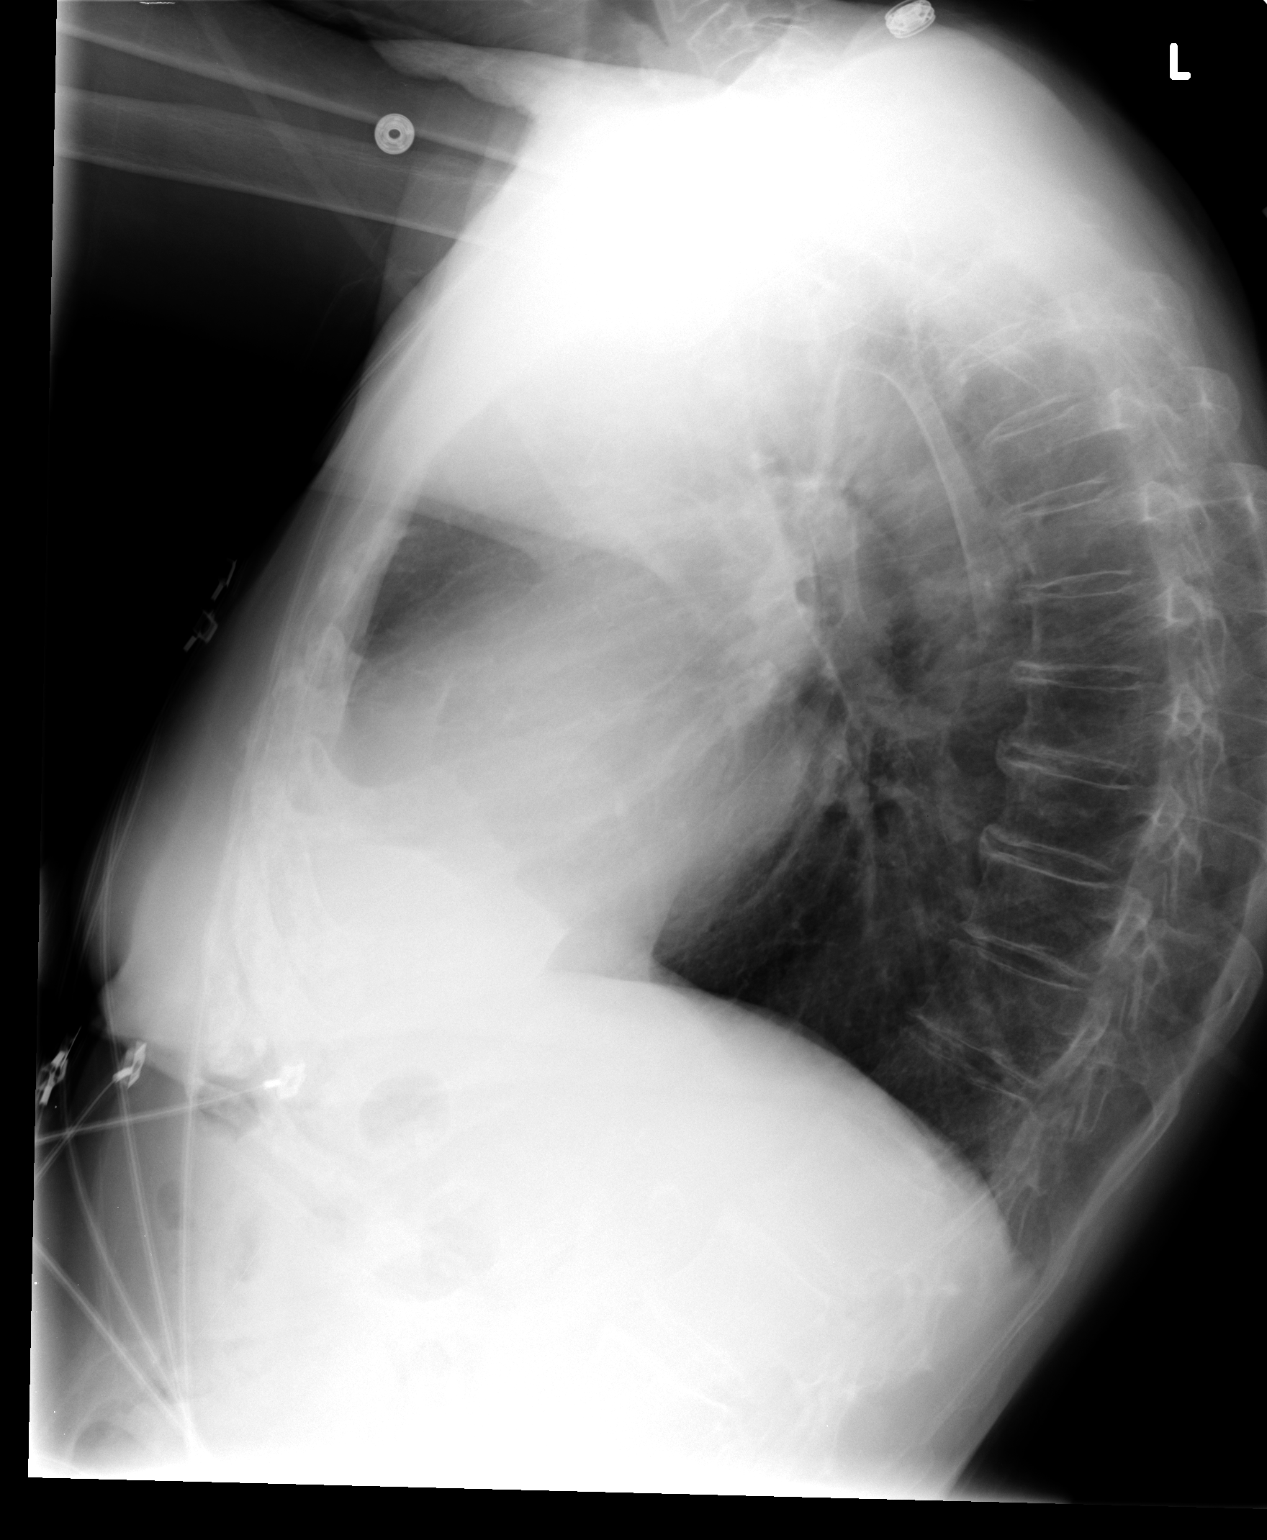

[2 of 2 positions shown; findings below may reference images not displayed]

FINDINGS: Cardiac silhouette is normal in size. Normal mediastinal and hilar
contours.

Lungs are hyperexpanded. There stable pleural parenchymal scarring
at both apices. No lung consolidation or edema. No pleural effusion
or pneumothorax.

Bony thorax is demineralized but intact.
IMPRESSION: No acute cardiopulmonary disease. Findings supports COPD. No change
from the prior exam.

## 2014-06-26 IMAGING — CT CT HEAD W/O CM
1 of 2 series · 15 of 30 positions shown, 19 images · non-contrast
Comparison: None.

CLINICAL DATA: Pt. Is complaining of constant moderate generalized
weakness that started a few weeks ago. Her daughter reports that pt
has been having shaking and tremors today. Pt states that she has
been coughing with decreased appetite. She reports that she has been
getting intermittently dizzy, but is not currently dizzy. She states
that she currently has a mild headache. She reports a hx of resolved
throat cancer, mini stroke, and a mild MI.

EXAM:
CT HEAD WITHOUT CONTRAST
TECHNIQUE: Contiguous axial images were obtained from the base of the skull
through the vertex without intravenous contrast.

[Series 3: headtrauma 2.4 h60s · axial · 0.48mm/px · z∈[+72,+202]mm · 15 of 60 slices shown, 19 images]
[im 4/60  brain]
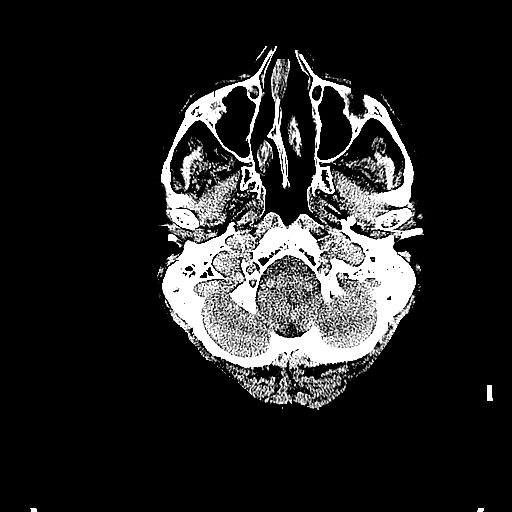
[im 4/60  bone]
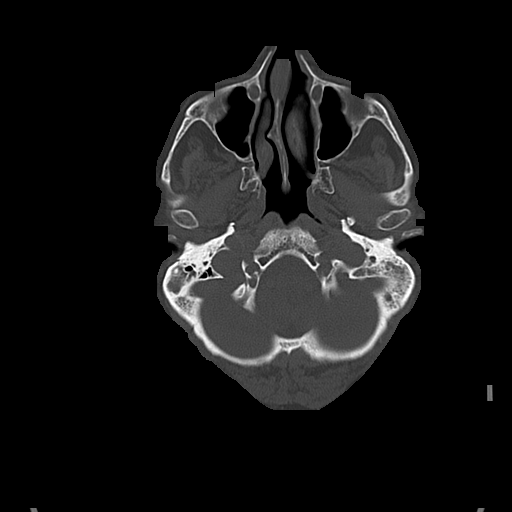
[im 7/60  brain]
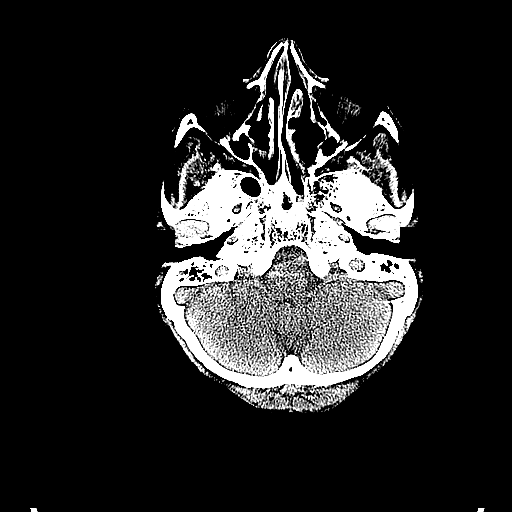
[im 13/60  brain]
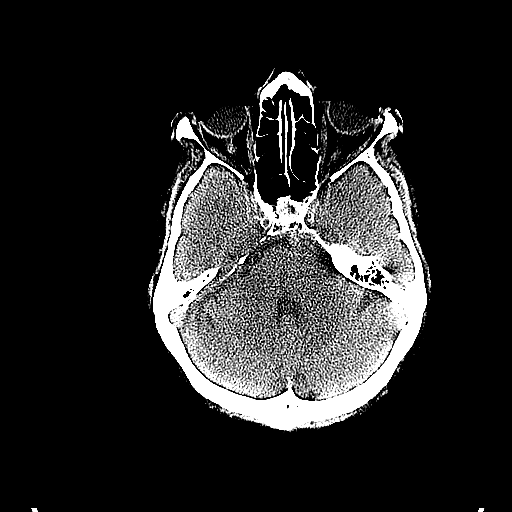
[im 16/60  brain]
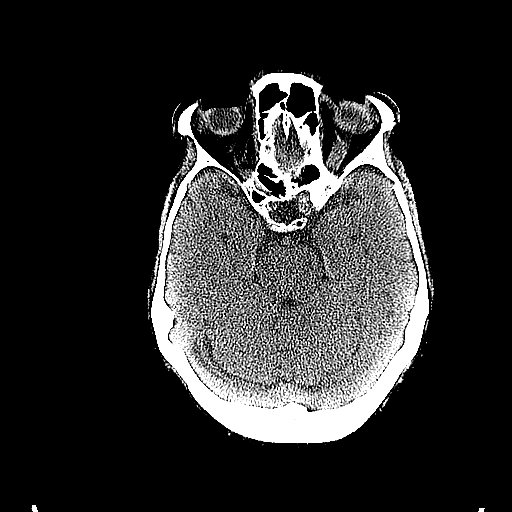
[im 19/60  brain]
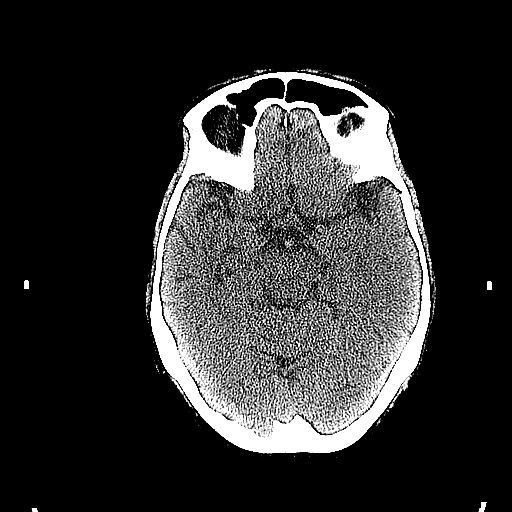
[im 19/60  bone]
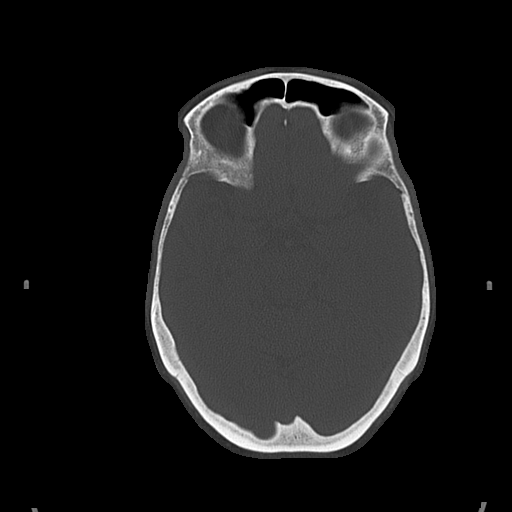
[im 22/60  brain]
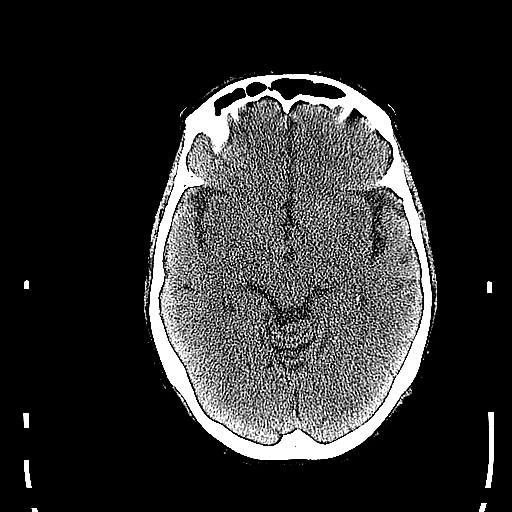
[im 25/60  brain]
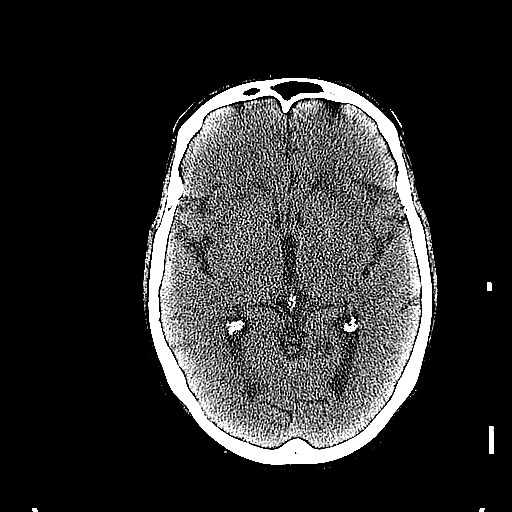
[im 32/60  brain]
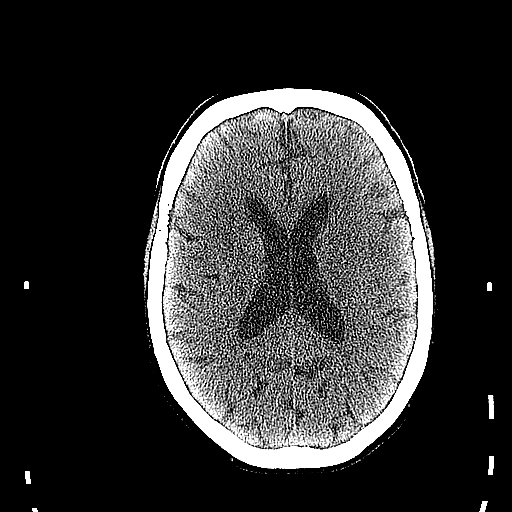
[im 35/60  brain]
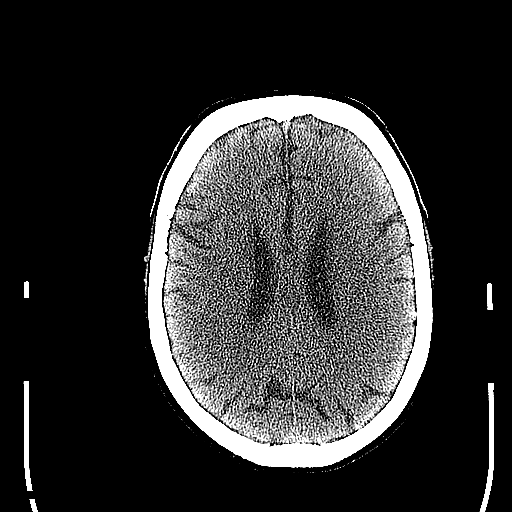
[im 35/60  bone]
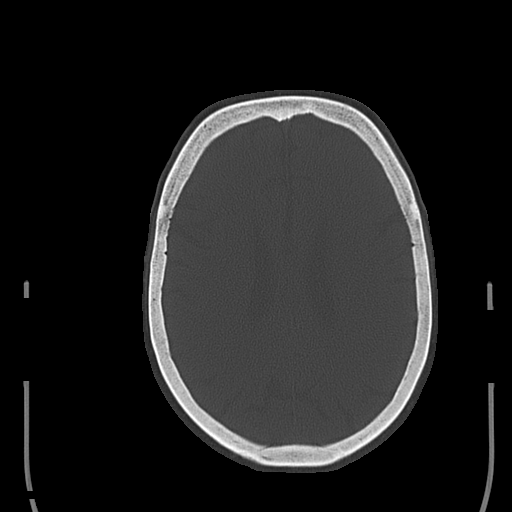
[im 38/60  brain]
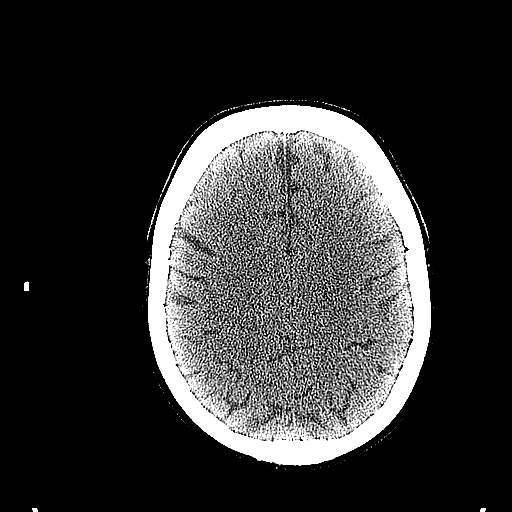
[im 41/60  brain]
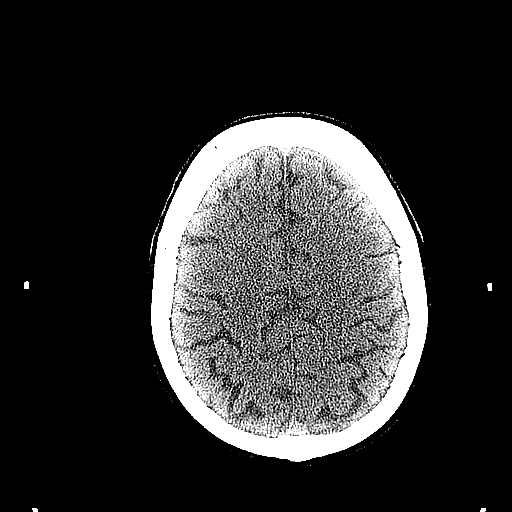
[im 44/60  brain]
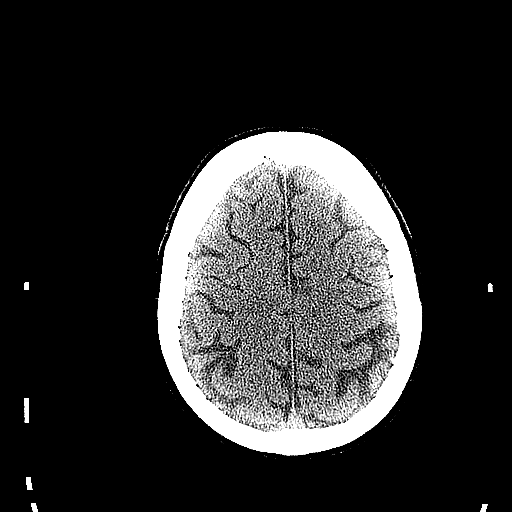
[im 50/60  brain]
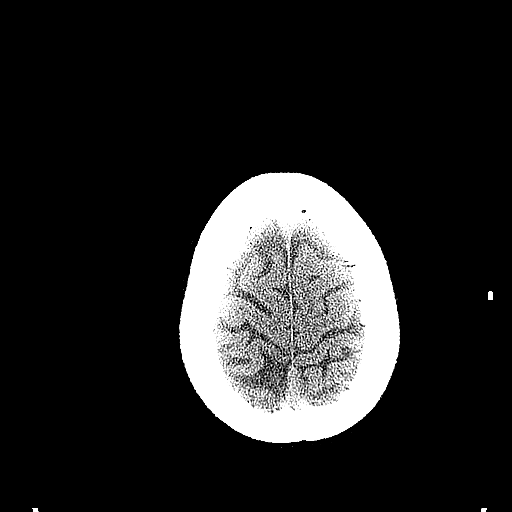
[im 50/60  bone]
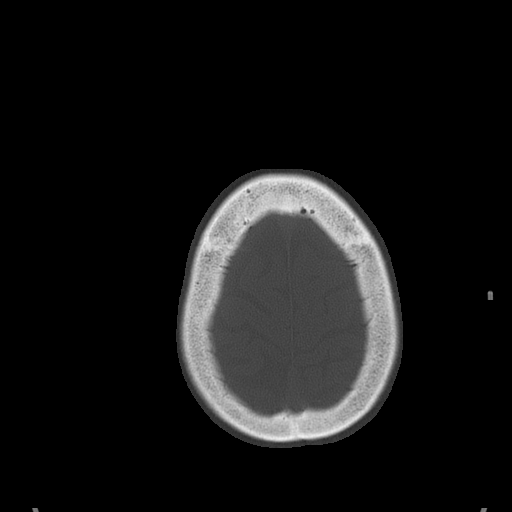
[im 53/60  brain]
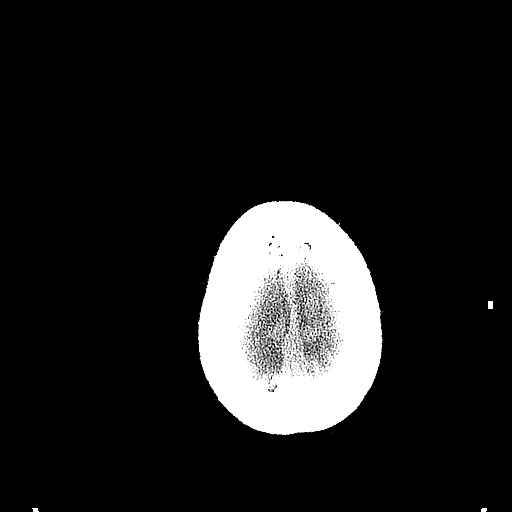
[im 56/60  brain]
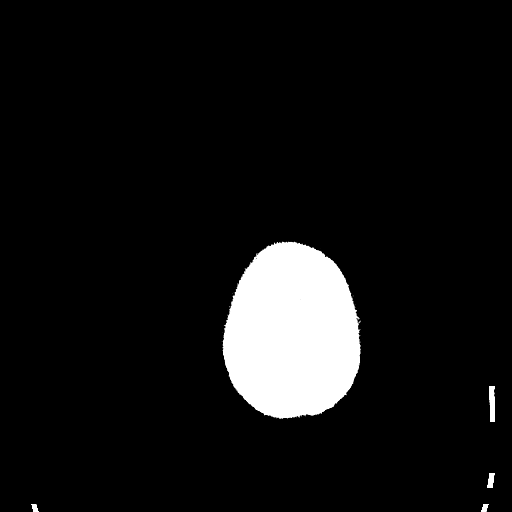

[15 of 30 positions shown; findings below may reference images not displayed]

FINDINGS: Ventricles, cisterns and other CSF spaces are normal. There is no
mass, mass effect, shift of midline structures or acute hemorrhage.
There is no evidence of acute infarction. Minimal chronic ischemic
microvascular disease. Remaining bones and soft tissues are within
normal.
IMPRESSION: No acute intracranial findings.

Minimal chronic small vessel ischemic disease.

## 2014-06-26 MED ORDER — SODIUM CHLORIDE 0.9 % IV BOLUS (SEPSIS)
1000.0000 mL | Freq: Once | INTRAVENOUS | Status: AC
  Administered 2014-06-26: 1000 mL via INTRAVENOUS

## 2014-06-26 NOTE — ED Provider Notes (Signed)
CSN: [id number]     Arrival date & time 06/26/14  1556 History  This chart was scribed for Ezequiel Essex, MD by Jeanell Sparrow, ED Scribe. This patient was seen in room APA07/[facility]07 and the patient's care was started at 4:27 PM.   Chief Complaint  Patient presents with  . Weakness   The history is provided by the patient. No language interpreter was used.   HPI Comments: Crystal Browning is a 80 y.o. female who presents to the Emergency Department complaining of constant moderate generalized weakness that started a few weeks ago. Her daughter reports that pt has been having shaking and tremors today. Pt states that she has been coughing with decreased appetite. She reports that she has been getting intermittently dizzy, but is not currently dizzy. She states that she currently has a mild headache. She reports that she is currently taking aspirin. She reports a hx of resolved throat cancer, mini stroke, and a mild MI. She denies any falls, fevers, chest pain, unusual SOB, emesis, trouble with BMs, dysuria, or blood in stool.   PCP- [facility]is Past Medical History  Diagnosis Date  . Hypertension   . Depression     History of Depression- Lexapro therapy  . TIA (transient ischemic attack)     History of TIA's with no residual effect  . Osteoporosis     History of Osteoporosis with one year of Actonel only  . Diverticulosis 02/2005    History of diverticulosis with admission from 8/18 - 03/04/2005  . 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 History of chemotherapy     S/P chemotherapy with Dr. Sonny Dandy -2006  . Radiation     S/P radiation thrapy -06/2005  . Hypothyroid    Past Surgical History  Procedure Laterality Date  . Direct laryngoscopy  03/27/2005    S/P direct laryngoscopy, esophagoscopy and biopsy with Dr. Constance Holster  . Tubal ligation    . Total abdominal hysterectomy w/ bilateral salpingoophorectomy  1986  .  Cataract extraction, bilateral     Family History  Problem Relation Age of Onset  . Stroke Mother   . Heart disease Mother 59  . Cancer Father 25    Prostate   History  Substance Use Topics  . Smoking status: Never Smoker   . Smokeless tobacco: Never Used  . Alcohol Use: No   OB History    No data available     Review of Systems A complete 10 system review of systems was obtained and all systems are negative except as noted in the HPI and PMH.   Allergies  Tuberculin tests and Paroxetine hcl  Home Medications   Prior to Admission medications   Medication Sig Start Date End Date Taking? Authorizing Provider  aspirin EC 81 MG tablet Take 81 mg by mouth at bedtime.   Yes Historical Provider, MD  Calcium Carbonate-Vitamin D (CALCIUM + D PO) Take 1 capsule by mouth 2 (two) times a week.    Yes Historical Provider, MD  escitalopram (LEXAPRO) 10 MG tablet Take 10 mg by mouth daily.   Yes Historical Provider, MD  levothyroxine (SYNTHROID, LEVOTHROID) 25 MCG tablet Take 25 mcg by mouth daily. 05/17/14  Yes Historical Provider, MD  metoprolol succinate (TOPROL-XL) 50 MG 24 hr tablet Take 50 mg by mouth daily. Take with or immediately following a meal.   Yes Historical Provider, MD  ranitidine (ZANTAC) 150  MG tablet Take 150 mg by mouth every 12 (twelve) hours. 05/17/14  Yes Historical Provider, MD   BP 176/81 mmHg  Pulse 89  Temp(Src) 97.8 F (36.6 C) (Oral)  Resp 19  Ht 5\' 1"  (1.549 m)  SpO2 100% Physical Exam  Constitutional: She is oriented to person, place, and time. She appears well-developed and well-nourished. No distress.  HENT:  Head: Normocephalic and atraumatic.  Mouth/Throat: Oropharynx is clear and moist. No oropharyngeal exudate.  Eyes: Conjunctivae and EOM are normal. Pupils are equal, round, and reactive to light.  Neck: Normal range of motion. Neck supple.  No meningismus.  Cardiovascular: Normal rate, regular rhythm, normal heart sounds and intact distal  pulses.   No murmur heard. Pulmonary/Chest: Effort normal and breath sounds normal. No respiratory distress.  Abdominal: Soft. There is no tenderness. There is no rebound and no guarding.  Genitourinary:  No gross blood. Chaperone present.   Musculoskeletal: Normal range of motion. She exhibits no edema or tenderness.  Neurological: She is alert and oriented to person, place, and time. No cranial nerve deficit. She exhibits normal muscle tone. Coordination normal.  No ataxia on finger to nose bilaterally. No pronator drift. 5/5 strength throughout. CN 2-12 intact. Negative Romberg. Equal grip strength. Sensation intact.   Skin: Skin is warm. No rash noted.  Psychiatric: She has a normal mood and affect. Her behavior is normal.  Nursing note and vitals reviewed.   ED Course  Procedures (including critical care time) DIAGNOSTIC STUDIES: Oxygen Saturation is 100% on RA, normal by my interpretation.    COORDINATION OF CARE: 4:31 PM- Pt advised of plan for treatment which includes medication, radiology, and labs and pt agrees.  Labs Review Labs Reviewed  URINALYSIS, ROUTINE W REFLEX MICROSCOPIC - Abnormal; Notable for the following:    Hgb urine dipstick SMALL (*)    All other components within normal limits  CBC WITH DIFFERENTIAL - Abnormal; Notable for the following:    RBC 3.80 (*)    HCT 35.6 (*)    All other components within normal limits  COMPREHENSIVE METABOLIC PANEL - Abnormal; Notable for the following:    Glucose, Bld 101 (*)    Creatinine, Ser 1.11 (*)    Total Bilirubin 0.2 (*)    GFR calc non Af Amer 46 (*)    GFR calc Af Amer 54 (*)    All other components within normal limits  TROPONIN I  URINE MICROSCOPIC-ADD ON  POC OCCULT BLOOD, ED    Imaging Review Dg Chest 2 View  06/26/2014   CLINICAL DATA:  Cough.  Weakness.  EXAM: CHEST  2 VIEW  COMPARISON:  02/21/2012.  FINDINGS: Cardiac silhouette is normal in size. Normal mediastinal and hilar contours.  Lungs are  hyperexpanded. There stable pleural parenchymal scarring at both apices. No lung consolidation or edema. No pleural effusion or pneumothorax.  Bony thorax is demineralized but intact.  IMPRESSION: No acute cardiopulmonary disease. Findings supports COPD. No change from the prior exam.   Electronically Signed   By: Lajean Manes M.D.   On: 06/26/2014 17:37   Ct Head Wo Contrast  06/26/2014   CLINICAL DATA:  Pt. Is complaining of constant moderate generalized weakness that started a few weeks ago. Her daughter reports that pt has been having shaking and tremors today. Pt states that she has been coughing with decreased appetite. She reports that she has been getting intermittently dizzy, but is not currently dizzy. She states that she currently has a mild  headache. She reports a hx of resolved throat cancer, mini stroke, and a mild MI.  EXAM: CT HEAD WITHOUT CONTRAST  TECHNIQUE: Contiguous axial images were obtained from the base of the skull through the vertex without intravenous contrast.  COMPARISON:  None.  FINDINGS: Ventricles, cisterns and other CSF spaces are normal. There is no mass, mass effect, shift of midline structures or acute hemorrhage. There is no evidence of acute infarction. Minimal chronic ischemic microvascular disease. Remaining bones and soft tissues are within normal.  IMPRESSION: No acute intracranial findings.  Minimal chronic small vessel ischemic disease.   Electronically Signed   By: Marin Olp M.D.   On: 06/26/2014 19:37     EKG Interpretation   Date/Time:  Saturday June 26 2014 16:32:05 EST Ventricular Rate:  88 PR Interval:  256 QRS Duration: 92 QT Interval:  380 QTC Calculation: 460 R Axis:   16 Text Interpretation:  Sinus rhythm Prolonged PR interval Baseline wander  in lead(s) I No significant change was found Confirmed by Wyvonnia Dusky  MD,  Suleiman Finigan [phone]) on 06/26/2014 4:46:21 PM      MDM   Final diagnoses:  Cough  Weakness   patient from home with 2  week history of fatigue, poor appetite, decreased by mouth intake and generalized weakness. No chest pain, fever, abdominal pain.  Labs at baseline. Hemoccult negative. Creatinine 1.1. Patient given IV fluids. UA is negative. She is able to ambulate without difficulty. She is tolerating by mouth.  No evidence of pneumonia, UTI. Suspect mild dehydration. She is tolerating by mouth fluids and received IV fluids. She is ambulatory. She appears stable for outpatient follow-up. Follow-up with PCP this week. Return to the ED with worsening symptoms including not eating, not drinking, decreased urine output or any other concerns.  BP 126/76 mmHg  Pulse 82  Temp(Src) 97.8 F (36.6 C) (Oral)  Resp 17  Ht 5\' 1"  (1.549 m)  SpO2 98%  I personally performed the services described in this documentation, which was scribed in my presence. The recorded information has been reviewed and is accurate.    Ezequiel Essex, MD 06/27/14 [phone]

## 2014-06-26 NOTE — ED Notes (Signed)
Feeling weak for a few weeks.  Per daughter pt thinks she has an UTI.

## 2014-06-26 NOTE — ED Notes (Signed)
I walked the Pt. Up the hallway a little bet. I asked the Pt if she felt weak or dizziness any. The pt. Stated that she was weak a little and dizziness just a little.

## 2014-06-26 NOTE — ED Notes (Addendum)
Pt is drinking a sprite. I asked her if she felt nausea or anything. She stated that she did not. She is doing good of taking her time.

## 2014-06-26 NOTE — ED Notes (Signed)
MD at bedside. 

## 2014-06-26 NOTE — Discharge Instructions (Signed)
Weakness There is no evidence of urinary tract infection or pneumonia. Follow-up with your doctor. Return to the ED with new or worsening symptoms. Weakness is a lack of strength. It may be felt all over the body (generalized) or in one specific part of the body (focal). Some causes of weakness can be serious. You may need further medical evaluation, especially if you are elderly or you have a history of immunosuppression (such as chemotherapy or HIV), kidney disease, heart disease, or diabetes. CAUSES  Weakness can be caused by many different things, including:  Infection.  Physical exhaustion.  Internal bleeding or other blood loss that results in a lack of red blood cells (anemia).  Dehydration. This cause is more common in elderly people.  Side effects or electrolyte abnormalities from medicines, such as pain medicines or sedatives.  Emotional distress, anxiety, or depression.  Circulation problems, especially severe peripheral arterial disease.  Heart disease, such as rapid atrial fibrillation, bradycardia, or heart failure.  Nervous system disorders, such as Guillain-Barr syndrome, multiple sclerosis, or stroke. DIAGNOSIS  To find the cause of your weakness, your caregiver will take your history and perform a physical exam. Lab tests or X-rays may also be ordered, if needed. TREATMENT  Treatment of weakness depends on the cause of your symptoms and can vary greatly. HOME CARE INSTRUCTIONS   Rest as needed.  Eat a well-balanced diet.  Try to get some exercise every day.  Only take over-the-counter or prescription medicines as directed by your caregiver. SEEK MEDICAL CARE IF:   Your weakness seems to be getting worse or spreads to other parts of your body.  You develop new aches or pains. SEEK IMMEDIATE MEDICAL CARE IF:   You cannot perform your normal daily activities, such as getting dressed and feeding yourself.  You cannot walk up and down stairs, or you feel  exhausted when you do so.  You have shortness of breath or chest pain.  You have difficulty moving parts of your body.  You have weakness in only one area of the body or on only one side of the body.  You have a fever.  You have trouble speaking or swallowing.  You cannot control your bladder or bowel movements.  You have black or bloody vomit or stools. MAKE SURE YOU:  Understand these instructions.  Will watch your condition.  Will get help right away if you are not doing well or get worse. Document Released: 07/02/2005 Document Revised: 01/01/2012 Document Reviewed: 08/31/2011 [facility] Patient Information 2015 [place], [place]. This information is not intended to replace advice given to you by your health care provider. Make sure you discuss any questions you have with your health care provider.

## 2014-06-28 LAB — POC OCCULT BLOOD, ED: Fecal Occult Bld: NEGATIVE

## 2014-07-22 DIAGNOSIS — K117 Disturbances of salivary secretion: Secondary | ICD-10-CM | POA: Diagnosis not present

## 2014-10-18 DIAGNOSIS — H02401 Unspecified ptosis of right eyelid: Secondary | ICD-10-CM | POA: Diagnosis not present

## 2014-10-18 DIAGNOSIS — H527 Unspecified disorder of refraction: Secondary | ICD-10-CM | POA: Diagnosis not present

## 2014-10-18 DIAGNOSIS — H43813 Vitreous degeneration, bilateral: Secondary | ICD-10-CM | POA: Diagnosis not present

## 2014-10-18 DIAGNOSIS — Z961 Presence of intraocular lens: Secondary | ICD-10-CM | POA: Diagnosis not present

## 2014-10-18 DIAGNOSIS — H26493 Other secondary cataract, bilateral: Secondary | ICD-10-CM | POA: Diagnosis not present

## 2015-02-22 DIAGNOSIS — I10 Essential (primary) hypertension: Secondary | ICD-10-CM | POA: Diagnosis not present

## 2015-02-22 DIAGNOSIS — E785 Hyperlipidemia, unspecified: Secondary | ICD-10-CM | POA: Diagnosis not present

## 2015-02-22 DIAGNOSIS — D518 Other vitamin B12 deficiency anemias: Secondary | ICD-10-CM | POA: Diagnosis not present

## 2015-02-22 DIAGNOSIS — Z79899 Other long term (current) drug therapy: Secondary | ICD-10-CM | POA: Diagnosis not present

## 2015-02-22 DIAGNOSIS — E039 Hypothyroidism, unspecified: Secondary | ICD-10-CM | POA: Diagnosis not present

## 2015-02-22 DIAGNOSIS — E119 Type 2 diabetes mellitus without complications: Secondary | ICD-10-CM | POA: Diagnosis not present

## 2015-05-03 ENCOUNTER — Encounter (HOSPITAL_COMMUNITY): Payer: Self-pay | Admitting: Dentistry

## 2015-05-03 ENCOUNTER — Ambulatory Visit (HOSPITAL_COMMUNITY): Payer: Medicaid - Dental | Admitting: Dentistry

## 2015-05-03 VITALS — BP 165/53 | HR 67 | Temp 98.1°F

## 2015-05-03 DIAGNOSIS — K0820 Unspecified atrophy of edentulous alveolar ridge: Secondary | ICD-10-CM

## 2015-05-03 DIAGNOSIS — Z972 Presence of dental prosthetic device (complete) (partial): Secondary | ICD-10-CM

## 2015-05-03 DIAGNOSIS — M264 Malocclusion, unspecified: Secondary | ICD-10-CM

## 2015-05-03 DIAGNOSIS — K08109 Complete loss of teeth, unspecified cause, unspecified class: Secondary | ICD-10-CM

## 2015-05-03 DIAGNOSIS — Z463 Encounter for fitting and adjustment of dental prosthetic device: Secondary | ICD-10-CM

## 2015-05-03 NOTE — Patient Instructions (Signed)
PLAN: RTC for denture recall in one year. Patient to call is sore spots arise.  Patient to use salt water rinses as needed to aid healing. Patient dismissed in stable condition.   Ruby Cola

## 2015-05-03 NOTE — Progress Notes (Signed)
05/03/2015  Patient:            Crystal Browning Date of Birth:  March 23, 1934 MRN:                [id number]  BP 165/53 mmHg  Pulse 67  Temp(Src) 98.1 F (36.7 C) (Oral)   Crystal Browning presents for periodic oral exam and evaluation of upper and lower complete dentures. Patient denies any medical changes.  Medical History.  Past Medical History  Diagnosis Date  . Hypertension   . Depression     History of Depression- Lexapro therapy  . TIA (transient ischemic attack)     History of TIA's with no residual effect  . Osteoporosis     History of Osteoporosis with one year of Actonel only  . Diverticulosis 02/2005    History of diverticulosis with admission from 8/18 - 03/04/2005  . Cancer ([place]) 03/28/11    SCCA of the Supraglottic Larynx (T2NoMo)    ; S/P Chemoradiation therapy from 05/09/05 thru 06/29/05  . History of chemotherapy     S/P chemotherapy with Dr. Sonny Dandy -2006  . Radiation     S/P radiation thrapy -06/2005  . Hypothyroid     Allergies  Allergen Reactions  . Tuberculin Tests Swelling  . Paroxetine Hcl Rash   Current Outpatient Prescriptions  Medication Sig Dispense Refill  . aspirin EC 81 MG tablet Take 81 mg by mouth at bedtime.    . Calcium Carbonate-Vitamin D (CALCIUM + D PO) Take 1 capsule by mouth 2 (two) times a week.     . escitalopram (LEXAPRO) 10 MG tablet Take 10 mg by mouth daily.    Marland Kitchen levothyroxine (SYNTHROID, LEVOTHROID) 25 MCG tablet Take 25 mcg by mouth daily.  3  . metoprolol succinate (TOPROL-XL) 50 MG 24 hr tablet Take 50 mg by mouth daily. Take with or immediately following a meal.    . ranitidine (ZANTAC) 150 MG tablet Take 150 mg by mouth every 12 (twelve) hours.  3   No current facility-administered medications for this visit.   CC: Patient without complaints.   Exam: General: Patient is a well-developed, slightly built female in no acute distress. Head and neck: No lymphadenopathy. No acute TMJ symptoms. Intraoral exam: Edentulous . Moderate  xerostomia.  No denture irritation or erythema noted. 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 Prosthodontics: Upper and lower dentures are stable with less than ideal retention consistent with atrophy of alveolar ridges. Patient had upper and lower complete denture relines inserted on 10/21/2012.  Procedure: Pressure indicting paste applied to dentures and adjustments made as needed. [place] Occlusion adjusted as needed. [place] Right posterior crossbite as before. Patient with tendency to protrude lower jaw with evaluation of occlusion. Patient was instructed on finding maximum intercuspation position. Less than ideal occlusion, but patient accepts results and is able to chew food without problems.  PLAN: RTC for denture recall in one year. Patient to call is sore spots arise.  Patient to use salt water rinses as needed to aid healing. Patient dismissed in stable condition.   Ruby Cola

## 2015-05-13 ENCOUNTER — Encounter (HOSPITAL_COMMUNITY): Payer: Self-pay | Admitting: *Deleted

## 2015-05-13 ENCOUNTER — Emergency Department (HOSPITAL_COMMUNITY)
Admission: EM | Admit: 2015-05-13 | Discharge: 2015-05-14 | Disposition: A | Discharge status: Home / Self Care | Payer: Medicare Other | Attending: Emergency Medicine | Admitting: Emergency Medicine

## 2015-05-13 DIAGNOSIS — Y9389 Activity, other specified: Secondary | ICD-10-CM | POA: Insufficient documentation

## 2015-05-13 DIAGNOSIS — Z8673 Personal history of transient ischemic attack (TIA), and cerebral infarction without residual deficits: Secondary | ICD-10-CM | POA: Insufficient documentation

## 2015-05-13 DIAGNOSIS — S0502XA Injury of conjunctiva and corneal abrasion without foreign body, left eye, initial encounter: Secondary | ICD-10-CM | POA: Insufficient documentation

## 2015-05-13 DIAGNOSIS — Z79899 Other long term (current) drug therapy: Secondary | ICD-10-CM | POA: Insufficient documentation

## 2015-05-13 DIAGNOSIS — Z7982 Long term (current) use of aspirin: Secondary | ICD-10-CM | POA: Insufficient documentation

## 2015-05-13 DIAGNOSIS — Z8521 Personal history of malignant neoplasm of larynx: Secondary | ICD-10-CM | POA: Diagnosis not present

## 2015-05-13 DIAGNOSIS — I10 Essential (primary) hypertension: Secondary | ICD-10-CM | POA: Diagnosis not present

## 2015-05-13 DIAGNOSIS — E039 Hypothyroidism, unspecified: Secondary | ICD-10-CM | POA: Insufficient documentation

## 2015-05-13 DIAGNOSIS — F329 Major depressive disorder, single episode, unspecified: Secondary | ICD-10-CM | POA: Insufficient documentation

## 2015-05-13 DIAGNOSIS — X58XXXA Exposure to other specified factors, initial encounter: Secondary | ICD-10-CM | POA: Diagnosis not present

## 2015-05-13 DIAGNOSIS — Y9289 Other specified places as the place of occurrence of the external cause: Secondary | ICD-10-CM | POA: Insufficient documentation

## 2015-05-13 DIAGNOSIS — Z8719 Personal history of other diseases of the digestive system: Secondary | ICD-10-CM | POA: Diagnosis not present

## 2015-05-13 DIAGNOSIS — M810 Age-related osteoporosis without current pathological fracture: Secondary | ICD-10-CM | POA: Diagnosis not present

## 2015-05-13 DIAGNOSIS — Y998 Other external cause status: Secondary | ICD-10-CM | POA: Diagnosis not present

## 2015-05-13 DIAGNOSIS — S0592XA Unspecified injury of left eye and orbit, initial encounter: Secondary | ICD-10-CM | POA: Diagnosis present

## 2015-05-13 MED ORDER — FLUORESCEIN SODIUM 1 MG OP STRP
1.0000 | ORAL_STRIP | Freq: Once | OPHTHALMIC | Status: DC
  Filled 2015-05-13: qty 1

## 2015-05-13 MED ORDER — ERYTHROMYCIN 5 MG/GM OP OINT: TOPICAL_OINTMENT | OPHTHALMIC | Status: DC

## 2015-05-13 MED ORDER — TETRACAINE HCL 0.5 % OP SOLN
2.0000 [drp] | Freq: Once | OPHTHALMIC | Status: DC
  Filled 2015-05-13: qty 2

## 2015-05-13 NOTE — ED Notes (Signed)
Pt c/o left eye pain that started today after scratching her eye.

## 2015-05-13 NOTE — Discharge Instructions (Signed)

## 2015-05-13 NOTE — ED Provider Notes (Signed)
By signing my name below, I, Randa Evens, attest that this documentation has been prepared under the direction and in the presence of [employer], DO. Electronically Signed: Randa Evens, ED Scribe. 05/13/2015. 11:26 PM.  TIME SEEN: 11:17 PM   CHIEF COMPLAINT: left eye pain  HPI: HPI Comments: Crystal Browning is a 81 y.o. female who presents to the Emergency Department complaining of left eye pain onset today. Pt states that the pain began after rubbing her eye. Pt states that she does have associated blurred vision in the left eye. Pt denies any injury or trauma to the eye. Pt denies contact use but states that she does wear reading glasses. Pt states he has a Hx of cataracts surgery. Denies HA, vomiting or other related symptoms.     ROS: See HPI Constitutional: no fever  Eyes: no drainage  ENT: no runny nose   Cardiovascular:  no chest pain  Resp: no SOB  GI: no vomiting GU: no dysuria Integumentary: no rash  Allergy: no hives  Musculoskeletal: no leg swelling  Neurological: no slurred speech ROS otherwise negative  PAST MEDICAL HISTORY/PAST SURGICAL HISTORY:  Past Medical History  Diagnosis Date  . Hypertension   . Depression     History of Depression- Lexapro therapy  . TIA (transient ischemic attack)     History of TIA's with no residual effect  . Osteoporosis     History of Osteoporosis with one year of Actonel only  . Diverticulosis 02/2005    History of diverticulosis with admission from 8/18 - 03/04/2005  . Cancer ([place]) 03/28/11    SCCA of the Supraglottic Larynx (T2NoMo)    ; S/P Chemoradiation therapy from 05/09/05 thru 06/29/05  . History of chemotherapy     S/P chemotherapy with Dr. Sonny Dandy -2006  . Radiation     S/P radiation thrapy -06/2005  . Hypothyroid     MEDICATIONS:  Prior to Admission medications   Medication Sig Start Date End Date Taking? Authorizing Provider  aspirin EC 81 MG tablet Take 81 mg by mouth at bedtime.   Yes Historical  Provider, MD  Calcium Carbonate-Vitamin D (CALCIUM + D PO) Take 1 capsule by mouth 2 (two) times a week.    Yes Historical Provider, MD  escitalopram (LEXAPRO) 10 MG tablet Take 10 mg by mouth daily.   Yes Historical Provider, MD  ibuprofen (ADVIL,MOTRIN) 200 MG tablet Take 200 mg by mouth every 6 (six) hours as needed for mild pain or moderate pain.   Yes Historical Provider, MD  levothyroxine (SYNTHROID, LEVOTHROID) 50 MCG tablet Take 50 mcg by mouth daily. 04/25/15  Yes Historical Provider, MD  metoprolol succinate (TOPROL-XL) 50 MG 24 hr tablet Take 50 mg by mouth daily. Take with or immediately following a meal.   Yes Historical Provider, MD  ranitidine (ZANTAC) 150 MG tablet Take 150 mg by mouth every 12 (twelve) hours. 05/17/14  Yes Historical Provider, MD  tetrahydrozoline-zinc (VISINE-AC) 0.05-0.25 % ophthalmic solution Apply 2 drops to eye 3 (three) times daily as needed (for irritation).   Yes Historical Provider, MD    ALLERGIES:  Allergies  Allergen Reactions  . Tuberculin Tests Swelling  . Paroxetine Hcl Rash    SOCIAL HISTORY:  Social History  Substance Use Topics  . Smoking status: Never Smoker   . Smokeless tobacco: Never Used  . Alcohol Use: No    FAMILY HISTORY: Family History  Problem Relation Age of Onset  . Stroke Mother   . Heart disease  Mother 49  . Cancer Father 25    Prostate    EXAM: BP 198/70 mmHg  Pulse 80  Temp(Src) 98.1 F (36.7 C) (Oral)  Resp 20  Ht 5\' 1"  (1.549 m)  Wt 139 lb (63.05 kg)  BMI 26.28 kg/m2  SpO2 96%   CONSTITUTIONAL: Alert and oriented and responds appropriately to questions. Well-appearing; well-nourished, appear comfortably and in no distress, elderly HEAD: Normocephalic EYES: Conjunctivae of the left eye is mildly injected, conjunctivae of the right eye is normal, PERRL, EOMI, no pain with consensual light response, Normal visual fields, left eye pressure average of 12 mm Hg, no corneal ulcer noted, corneal abrasion over  the lateral aspect of the iris and pupil and another small abrasion at the 4 o' clock position of the left eye; normal red reflex, blurry vision improved after tetracaine, no foreign body appreciated, no drainage ENT: normal nose; no rhinorrhea; moist mucous membranes; pharynx without lesions noted NECK: Supple, no meningismus, no LAD  CARD: RRR; S1 and S2 appreciated; no murmurs, no clicks, no rubs, no gallops RESP: Normal chest excursion without splinting or tachypnea; breath sounds clear and equal bilaterally; no wheezes, no rhonchi, no rales, no hypoxia or respiratory distress, speaking full sentences ABD/GI: Normal bowel sounds; non-distended; soft, non-tender, no rebound, no guarding, no peritoneal signs BACK:  The back appears normal and is non-tender to palpation, there is no CVA tenderness EXT: Normal ROM in all joints; non-tender to palpation; no edema; normal capillary refill; no cyanosis, no calf tenderness or swelling    SKIN: Normal color for age and race; warm NEURO: Moves all extremities equally, sensation to light touch intact diffusely, cranial nerves II through XII intact PSYCH: The patient's mood and manner are appropriate. Grooming and personal hygiene are appropriate.  MEDICAL DECISION MAKING: Patient here with corneal abrasions to the left eye. Normal visual acuity. She is hypertensive in the emergency department and have advised her to follow-up with her primary care physician for this. She is on metoprolol. No headache. No vomiting. No signs of acute glaucoma. No retained foreign body. No headache, vomiting. Nothing to suggest temporal arteritis or optic neuritis. No sign of hyphema or hypopyon. We'll discharge with prescription for erythromycin ointment. Have recommend she follow-up with her ophthalmologist next week. Discussed return precautions. She verbalized understanding and is comfortable with this plan.   I personally performed the services described in this  documentation, which was scribed in my presence. The recorded information has been reviewed and is accurate.    [place], DO 05/14/15 [phone]

## 2015-05-14 NOTE — ED Notes (Signed)
Discharge instructions given, pt demonstrated teach back and verbal understanding. No concerns voiced.  

## 2015-07-12 ENCOUNTER — Ambulatory Visit (INDEPENDENT_AMBULATORY_CARE_PROVIDER_SITE_OTHER): Payer: Medicare Other

## 2015-07-12 ENCOUNTER — Encounter: Payer: Self-pay | Admitting: Family Medicine

## 2015-07-12 ENCOUNTER — Ambulatory Visit (INDEPENDENT_AMBULATORY_CARE_PROVIDER_SITE_OTHER): Payer: Medicare Other | Admitting: Family Medicine

## 2015-07-12 VITALS — BP 101/65 | HR 81 | Temp 97.3°F | Ht 61.0 in | Wt 138.6 lb

## 2015-07-12 DIAGNOSIS — E559 Vitamin D deficiency, unspecified: Secondary | ICD-10-CM | POA: Diagnosis not present

## 2015-07-12 DIAGNOSIS — R1084 Generalized abdominal pain: Secondary | ICD-10-CM | POA: Diagnosis not present

## 2015-07-12 DIAGNOSIS — E039 Hypothyroidism, unspecified: Secondary | ICD-10-CM | POA: Diagnosis not present

## 2015-07-12 DIAGNOSIS — K5901 Slow transit constipation: Secondary | ICD-10-CM | POA: Diagnosis not present

## 2015-07-12 DIAGNOSIS — E785 Hyperlipidemia, unspecified: Secondary | ICD-10-CM

## 2015-07-12 DIAGNOSIS — I209 Angina pectoris, unspecified: Secondary | ICD-10-CM | POA: Diagnosis not present

## 2015-07-12 DIAGNOSIS — I10 Essential (primary) hypertension: Secondary | ICD-10-CM | POA: Diagnosis not present

## 2015-07-12 DIAGNOSIS — Z780 Asymptomatic menopausal state: Secondary | ICD-10-CM | POA: Diagnosis not present

## 2015-07-12 LAB — POCT URINALYSIS DIPSTICK
Bilirubin, UA: NEGATIVE
Glucose, UA: NEGATIVE
Ketones, UA: NEGATIVE
Leukocytes, UA: NEGATIVE
NITRITE UA: NEGATIVE
PH UA: 6.5
Protein, UA: NEGATIVE
RBC UA: NEGATIVE
Spec Grav, UA: <=1.005
UROBILINOGEN UA: NEGATIVE

## 2015-07-12 IMAGING — CR DG ABDOMEN 2V
2 series · 2 of 2 positions shown · non-contrast
Comparison: None.

CLINICAL DATA: Abdominal pain, constipation

EXAM:
ABDOMEN - 2 VIEW

[view not recorded (1 of 2)]
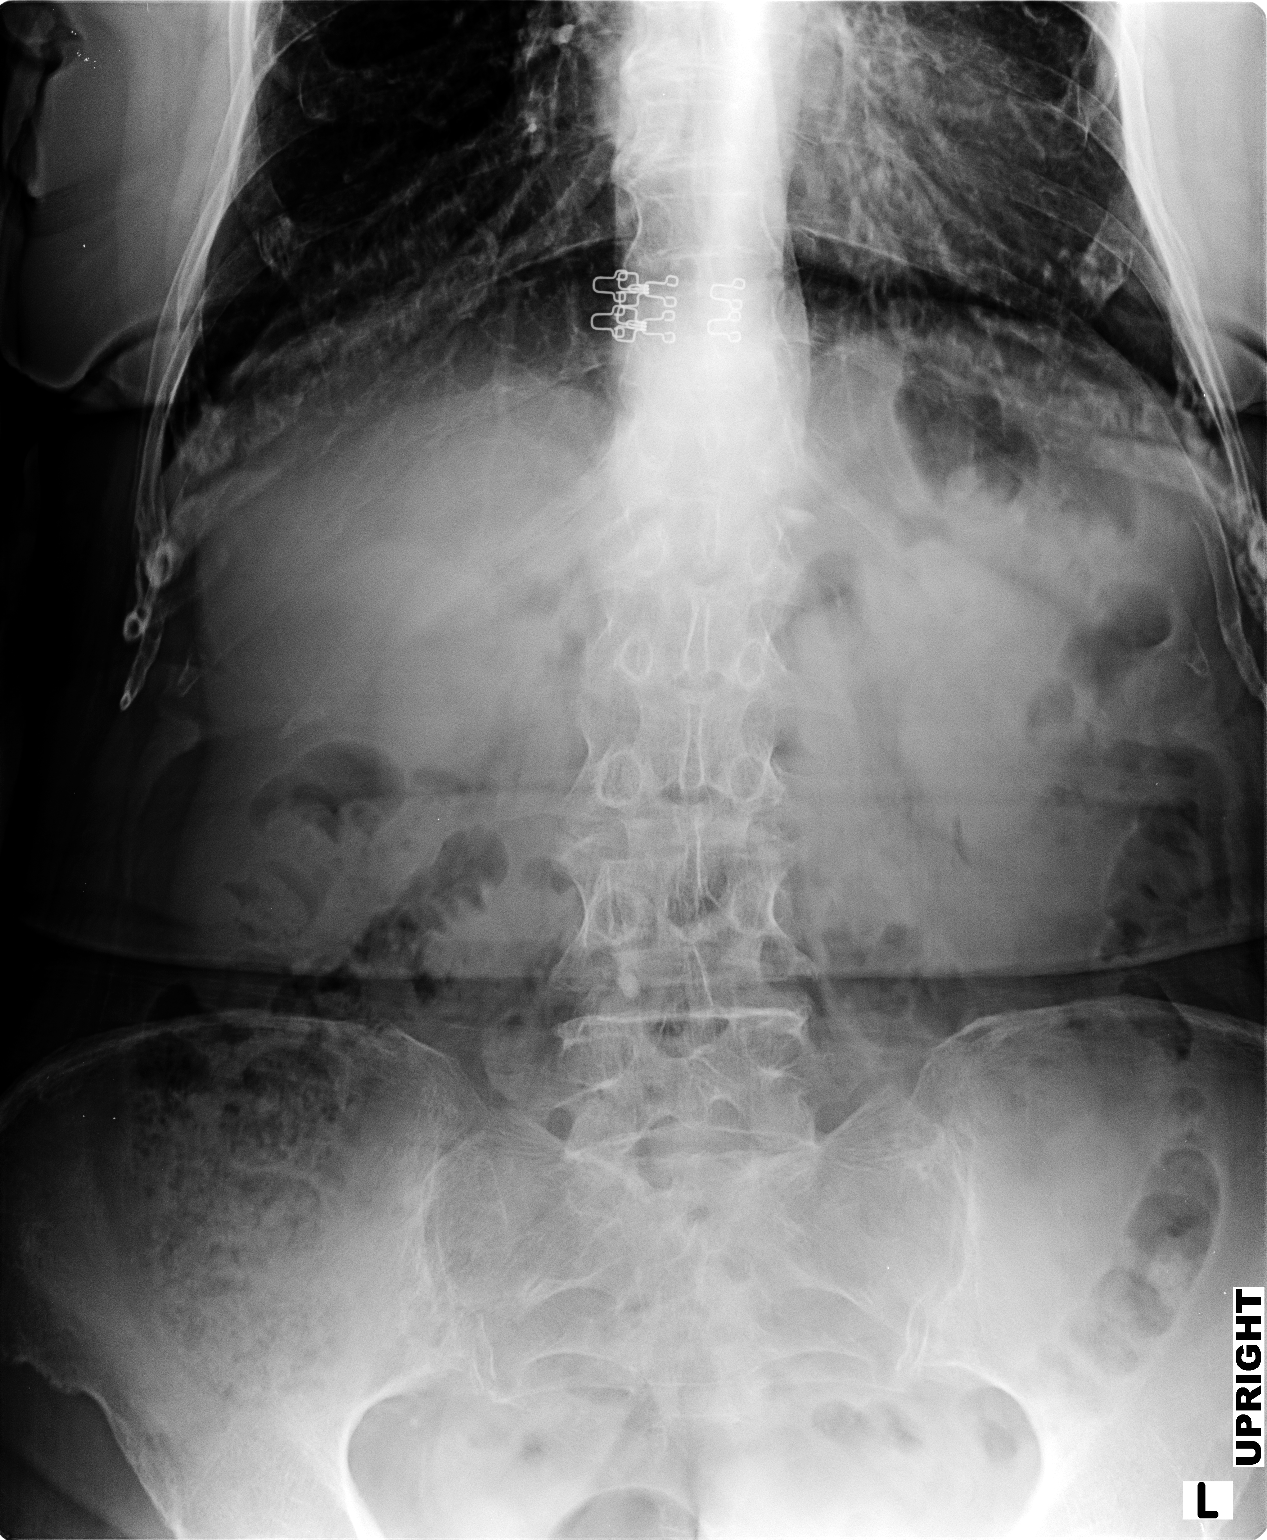

[view not recorded (2 of 2)]
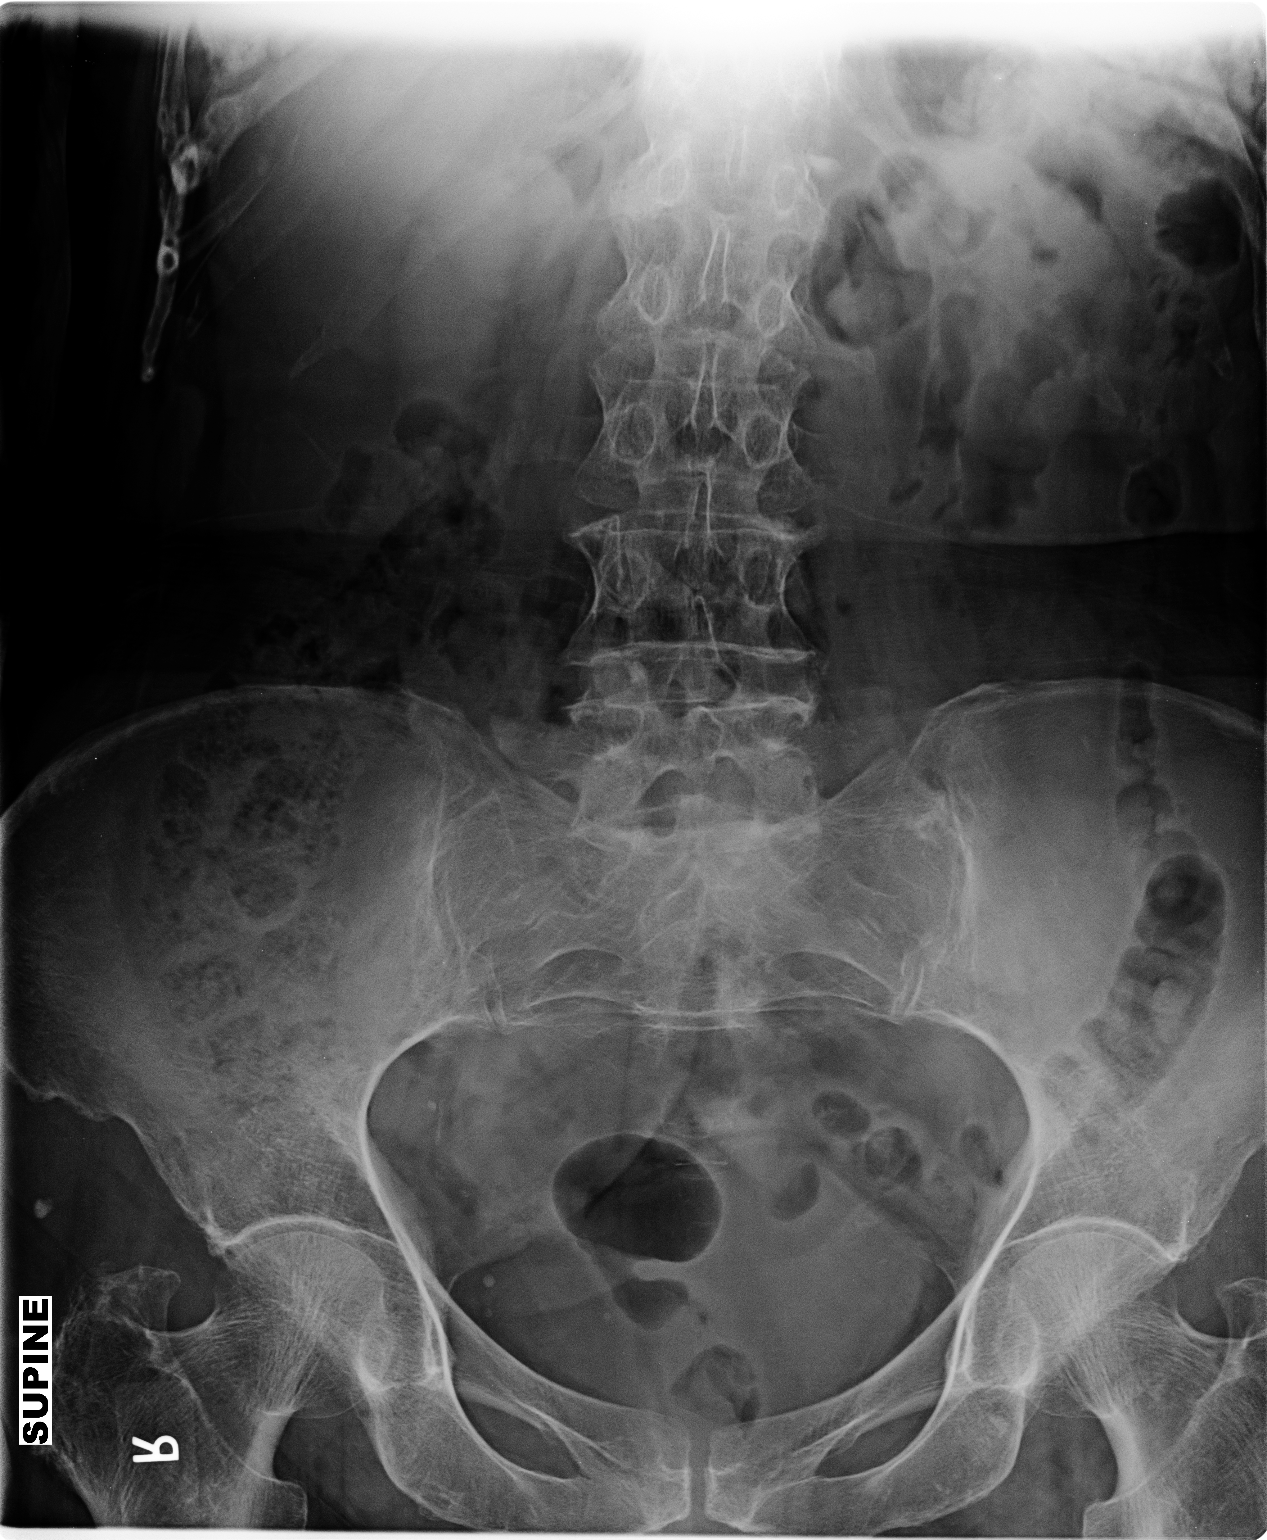

[2 of 2 positions shown; findings below may reference images not displayed]

FINDINGS: Nonobstructive bowel gas pattern.

Moderate right colonic stool burden.

No evidence of free air under the diaphragm on the upright view.

Degenerative changes of the lower lumbar spine.
IMPRESSION: No evidence of small bowel obstruction or free air.

Moderate right colonic stool burden.

## 2015-07-12 MED ORDER — POLYETHYLENE GLYCOL 3350 17 GM/SCOOP PO POWD: 17.0000 g | Freq: Two times a day (BID) | ORAL | Status: DC | PRN

## 2015-07-12 NOTE — Progress Notes (Signed)
Subjective:  Patient ID: Crystal Browning, female    DOB: November 10, 1933  Age: 81 y.o. MRN: [id number]  CC: New Patient (Initial Visit)   HPI Crystal Browning presents for  Concern about thyroid dx. She has a history of hypothyroidism for many years. It has been stable recently. Pt. denies any change in  voice, loss of hair, heat or cold intolerance. Energy level has been adequate to good. She  frequenly feels constipation and denies diarrhea. No myxedema. Medication is as noted below. Verified that pt is taking irregularly Well tolerated. Weak spells over the last few weeks. Missing doses of lexapro. Daughter says she is ill, that is, irritable. Sometimes misses several days and has panic. Weakness accompanied by dizziness and blurred vision.  Increasing abdominal discomfort generalized. It is a dull ache since of bloating with some occasional cramping. Pain is mild to moderate 4-6/10. Her bowel movements have been irregular. She has tried [place] but takes it only rarely. She has not had any fever no flank pain no dysuria. Onset 2-3 weeks ago.  Tends to miss multiple days doses of all meds intermittently.  Has had chest pain related to ischemia. Under good control with metoprolol  Again misses doses intermittently. Unclear about any relation between that and her dizziness or if she has elevated BP or chest pain when missing doses.  History Crystal Browning has a past medical history of Hypertension; Depression; TIA (transient ischemic attack); Osteoporosis; Diverticulosis (02/2005); Cancer ([place]) (03/28/11); History of chemotherapy; Radiation; and Hypothyroid.   She has past surgical history that includes Direct laryngoscopy (03/27/2005); Tubal ligation; Total abdominal hysterectomy w/ bilateral salpingoophorectomy (1986); and Cataract extraction, bilateral.   Her family history includes Alcohol abuse in her brother; COPD in her brother; Cancer (age of onset: 16) in her father; Heart disease (age of onset: 75) in  her mother; Stroke in her mother.She reports that she has never smoked. She has never used smokeless tobacco. She reports that she does not drink alcohol or use illicit drugs.  Current Outpatient Prescriptions on File Prior to Visit  Medication Sig Dispense Refill  . aspirin EC 81 MG tablet Take 81 mg by mouth at bedtime.    . Calcium Carbonate-Vitamin D (CALCIUM + D PO) Take 1 capsule by mouth 2 (two) times a week.     . erythromycin ophthalmic ointment Place a 1/2 inch ribbon of ointment into the lower eyelid twice a day for 5 days 1 g 0  . escitalopram (LEXAPRO) 10 MG tablet Take 10 mg by mouth daily.    Marland Kitchen ibuprofen (ADVIL,MOTRIN) 200 MG tablet Take 200 mg by mouth every 6 (six) hours as needed for mild pain or moderate pain.    Marland Kitchen levothyroxine (SYNTHROID, LEVOTHROID) 50 MCG tablet Take 50 mcg by mouth daily.  3  . metoprolol succinate (TOPROL-XL) 50 MG 24 hr tablet Take 50 mg by mouth daily. Take with or immediately following a meal.    . ranitidine (ZANTAC) 150 MG tablet Take 150 mg by mouth every 12 (twelve) hours.  3  . tetrahydrozoline-zinc (VISINE-AC) 0.05-0.25 % ophthalmic solution Apply 2 drops to eye 3 (three) times daily as needed (for irritation).     No current facility-administered medications on file prior to visit.    ROS Review of Systems  Constitutional: Negative for fever, activity change and appetite change.  HENT: Negative for congestion, rhinorrhea and sore throat.   Eyes: Negative for visual disturbance.  Respiratory: Negative for cough and shortness of breath.  Cardiovascular: Negative for chest pain and palpitations.  Gastrointestinal: Positive for constipation. Negative for nausea, abdominal pain and diarrhea.  Genitourinary: Negative for dysuria.  Musculoskeletal: Negative for myalgias and arthralgias.  Skin:       Pruritis of calves and back.     Objective:  BP 101/65 mmHg  Pulse 81  Temp(Src) 97.3 F (36.3 C) (Oral)  Ht '5\' 1"'  (1.549 m)  Wt 138 lb  9.6 oz (62.869 kg)  BMI 26.20 kg/m2  SpO2 99%  Physical Exam  Constitutional: She is oriented to person, place, and time. She appears well-developed and well-nourished. No distress.  HENT:  Head: Normocephalic and atraumatic.  Right Ear: External ear normal. Decreased hearing is noted.  Left Ear: External ear normal. Decreased hearing is noted.  Nose: Nose normal.  Mouth/Throat: Oropharynx is clear and moist.  Moderate wax in canals  Eyes: Conjunctivae and EOM are normal. Pupils are equal, round, and reactive to light.  Neck: Normal range of motion. Neck supple. No thyromegaly present.  Cardiovascular: Normal rate, regular rhythm and normal heart sounds.   No murmur heard. Pulmonary/Chest: Effort normal and breath sounds normal. No respiratory distress. She has no wheezes. She has no rales.  Abdominal: Soft. Bowel sounds are normal. She exhibits no distension. There is no tenderness.  Lymphadenopathy:    She has no cervical adenopathy.  Neurological: She is alert and oriented to person, place, and time. She has normal reflexes.  Skin: Skin is warm and dry.  Psychiatric: She has a normal mood and affect. Her behavior is normal. Judgment and thought content normal.    Assessment & Plan:   Crystal Browning was seen today for new patient (initial visit).  Diagnoses and all orders for this visit:  Postmenopausal -     HM MAMMOGRAPHY -     DG Bone Density; Future -     Vitamin D 1,25 dihydroxy  Essential hypertension  Angina pectoris (HCC)  Generalized abdominal pain -     CBC with Differential/Platelet -     CMP14+EGFR -     POCT urinalysis dipstick -     DG Abd 2 Views; Future  Hypothyroidism, unspecified hypothyroidism type -     TSH + free T4  Hyperlipidemia -     Lipid panel  Vitamin D deficiency -     Vitamin D 1,25 dihydroxy  Slow transit constipation  Other orders -     polyethylene glycol powder (GLYCOLAX/MIRALAX) powder; Take 17 g by mouth 2 (two) times daily as  needed for moderate constipation. For constipation   I am having Crystal Browning start on polyethylene glycol powder. I am also having her maintain her metoprolol succinate, escitalopram, Calcium Carbonate-Vitamin D (CALCIUM + D PO), ranitidine, aspirin EC, levothyroxine, tetrahydrozoline-zinc, ibuprofen, and erythromycin.  Meds ordered this encounter  Medications  . polyethylene glycol powder (GLYCOLAX/MIRALAX) powder    Sig: Take 17 g by mouth 2 (two) times daily as needed for moderate constipation. For constipation    Dispense:  3350 g    Refill:  5    Earwax removal:  Debrox drops are available without a prescription at your pharmacy.  Lay on your side with the ear up that you want to treat. Place for 5 drops of the Debrox in the ear canal and lay still for 15 minutes. After that time you considered up and allow the excess to run out of the year and catch it with a Kleenex. Repeat this with the other ear if needed.  Repeat this process daily for 1 week. By that time the ear should feel less clogged and her hearing should be better, if not, follow up in the office for recheck of the ear.  Be sure to take all of your prescribed medicines daily as recommended.  For symptoms of constipation take MiraLAX or storebrand of the same medicine is fine. Use it daily until you have a normal full bowel movement. Go ahead and use it even if you have a rather small bowel movement. You may discontinue it if your bowels are moving daily with a full bowel movement otherwise use the medication daily. Increase fiber. Handout given.   Follow-up: Return in about 3 months (around 10/10/2015), or if symptoms worsen or fail to improve.  [name], M.D.

## 2015-07-12 NOTE — Patient Instructions (Signed)
Earwax removal:  Debrox drops are available without a prescription at your pharmacy.  Lay on your side with the ear up that you want to treat. Place for 5 drops of the Debrox in the ear canal and lay still for 15 minutes. After that time you considered up and allow the excess to run out of the year and catch it with a Kleenex. Repeat this with the other ear if needed.  Repeat this process daily for 1 week. By that time the ear should feel less clogged and her hearing should be better, if not, follow up in the office for recheck of the ear.  Thanks, Crystal Browning  Be sure to take all of your prescribed medicines daily as recommended.  For symptoms of constipation take MiraLAX or storebrand of the same medicine is fine. Use it daily until you have a normal full bowel movement. Go ahead and use it even if you have a rather small bowel movement. You may discontinue it if your bowels are moving daily with a full bowel movement otherwise use the medication daily.

## 2015-07-14 ENCOUNTER — Telehealth: Payer: Self-pay | Admitting: Family Medicine

## 2015-07-14 NOTE — Telephone Encounter (Signed)
Labs are not back yet. Attempted to call and let family know.

## 2015-07-17 DIAGNOSIS — E039 Hypothyroidism, unspecified: Secondary | ICD-10-CM | POA: Insufficient documentation

## 2015-07-17 DIAGNOSIS — R1084 Generalized abdominal pain: Secondary | ICD-10-CM

## 2015-07-17 DIAGNOSIS — Z780 Asymptomatic menopausal state: Secondary | ICD-10-CM | POA: Insufficient documentation

## 2015-07-17 HISTORY — DX: Generalized abdominal pain: R10.84

## 2015-07-18 ENCOUNTER — Telehealth: Payer: Self-pay | Admitting: Family Medicine

## 2015-07-18 NOTE — Telephone Encounter (Signed)
erroneous

## 2015-07-19 LAB — CMP14+EGFR
A/G RATIO: 1.9 (ref 1.1–2.5)
ALK PHOS: 65 IU/L (ref 39–117)
ALT: 15 IU/L (ref 0–32)
AST: 24 IU/L (ref 0–40)
Albumin: 4.3 g/dL (ref 3.5–4.8)
BUN/Creatinine Ratio: 13 (ref 11–26)
BUN: 11 mg/dL (ref 8–27)
Bilirubin Total: <0.2 mg/dL (ref 0.0–1.2)
CO2: 23 mmol/L (ref 18–29)
Calcium: 9.5 mg/dL (ref 8.7–10.3)
Chloride: 103 mmol/L (ref 96–106)
Creatinine, Ser: 0.82 mg/dL (ref 0.57–1.00)
GFR calc non Af Amer: 68 mL/min/{1.73_m2} (ref >59)
GFR, EST AFRICAN AMERICAN: 79 mL/min/{1.73_m2} (ref >59)
GLUCOSE: 87 mg/dL (ref 65–99)
Globulin, Total: 2.3 g/dL (ref 1.5–4.5)
POTASSIUM: 4.2 mmol/L (ref 3.5–5.2)
Sodium: 140 mmol/L (ref 134–144)
TOTAL PROTEIN: 6.6 g/dL (ref 6.0–8.5)

## 2015-07-19 LAB — CBC WITH DIFFERENTIAL/PLATELET
BASOS: 0 %
Basophils Absolute: 0 10*3/uL (ref 0.0–0.2)
EOS (ABSOLUTE): 0.3 10*3/uL (ref 0.0–0.4)
Eos: 5 %
Hematocrit: 36.9 % (ref 34.0–46.6)
Hemoglobin: 12.1 g/dL (ref 11.1–15.9)
IMMATURE GRANS (ABS): 0 10*3/uL (ref 0.0–0.1)
Immature Granulocytes: 0 %
Lymphocytes Absolute: 1.4 10*3/uL (ref 0.7–3.1)
Lymphs: 27 %
MCH: 31.3 pg (ref 26.6–33.0)
MCHC: 32.8 g/dL (ref 31.5–35.7)
MCV: 95 fL (ref 79–97)
Monocytes Absolute: 0.4 10*3/uL (ref 0.1–0.9)
Monocytes: 8 %
NEUTROS ABS: 3.1 10*3/uL (ref 1.4–7.0)
Neutrophils: 60 %
Platelets: 259 10*3/uL (ref 150–379)
RBC: 3.87 x10E6/uL (ref 3.77–5.28)
RDW: 14 % (ref 12.3–15.4)
WBC: 5.2 10*3/uL (ref 3.4–10.8)

## 2015-07-19 LAB — LIPID PANEL
CHOLESTEROL TOTAL: 192 mg/dL (ref 100–199)
Chol/HDL Ratio: 3.8 ratio units (ref 0.0–4.4)
HDL: 51 mg/dL (ref >39)
LDL Calculated: 109 mg/dL — ABNORMAL HIGH (ref 0–99)
Triglycerides: 161 mg/dL — ABNORMAL HIGH (ref 0–149)
VLDL Cholesterol Cal: 32 mg/dL (ref 5–40)

## 2015-07-19 LAB — VITAMIN D 1,25 DIHYDROXY
VITAMIN D 1, 25 (OH) TOTAL: 45 pg/mL
Vitamin D3 1, 25 (OH)2: 45 pg/mL

## 2015-07-19 LAB — TSH+FREE T4
FREE T4: 1.24 ng/dL (ref 0.82–1.77)
TSH: 3.48 u[IU]/mL (ref 0.450–4.500)

## 2015-07-19 LAB — SPECIMEN STATUS REPORT

## 2015-07-26 NOTE — Telephone Encounter (Signed)
Patient notified of labs 1/4

## 2015-07-29 DIAGNOSIS — K117 Disturbances of salivary secretion: Secondary | ICD-10-CM | POA: Diagnosis not present

## 2015-07-29 DIAGNOSIS — Z8521 Personal history of malignant neoplasm of larynx: Secondary | ICD-10-CM | POA: Diagnosis not present

## 2015-09-12 ENCOUNTER — Other Ambulatory Visit: Payer: Self-pay | Admitting: Family Medicine

## 2015-09-23 ENCOUNTER — Encounter (HOSPITAL_COMMUNITY): Payer: Self-pay | Admitting: Emergency Medicine

## 2015-09-23 ENCOUNTER — Emergency Department (HOSPITAL_COMMUNITY): Payer: Medicare Other

## 2015-09-23 ENCOUNTER — Emergency Department (HOSPITAL_COMMUNITY)
Admission: EM | Admit: 2015-09-23 | Discharge: 2015-09-23 | Disposition: A | Discharge status: Home / Self Care | Payer: Medicare Other | Attending: Emergency Medicine | Admitting: Emergency Medicine

## 2015-09-23 DIAGNOSIS — Z79899 Other long term (current) drug therapy: Secondary | ICD-10-CM | POA: Diagnosis not present

## 2015-09-23 DIAGNOSIS — R1032 Left lower quadrant pain: Secondary | ICD-10-CM

## 2015-09-23 DIAGNOSIS — Z8521 Personal history of malignant neoplasm of larynx: Secondary | ICD-10-CM | POA: Insufficient documentation

## 2015-09-23 DIAGNOSIS — Z7982 Long term (current) use of aspirin: Secondary | ICD-10-CM | POA: Diagnosis not present

## 2015-09-23 DIAGNOSIS — E039 Hypothyroidism, unspecified: Secondary | ICD-10-CM | POA: Diagnosis not present

## 2015-09-23 DIAGNOSIS — K5900 Constipation, unspecified: Secondary | ICD-10-CM | POA: Diagnosis present

## 2015-09-23 DIAGNOSIS — I10 Essential (primary) hypertension: Secondary | ICD-10-CM | POA: Diagnosis not present

## 2015-09-23 DIAGNOSIS — K5732 Diverticulitis of large intestine without perforation or abscess without bleeding: Secondary | ICD-10-CM | POA: Insufficient documentation

## 2015-09-23 DIAGNOSIS — Z8673 Personal history of transient ischemic attack (TIA), and cerebral infarction without residual deficits: Secondary | ICD-10-CM | POA: Diagnosis not present

## 2015-09-23 DIAGNOSIS — F329 Major depressive disorder, single episode, unspecified: Secondary | ICD-10-CM | POA: Insufficient documentation

## 2015-09-23 DIAGNOSIS — K5792 Diverticulitis of intestine, part unspecified, without perforation or abscess without bleeding: Secondary | ICD-10-CM | POA: Diagnosis not present

## 2015-09-23 IMAGING — DX DG ABDOMEN 1V
1 series · 1 of 1 positions shown · non-contrast
Comparison: Abdominal radiograph [DATE].

CLINICAL DATA: 81-year-old female with history of left lower
quadrant abdominal pain since yesterday. No associated nausea,
vomiting or diarrhea.

EXAM:
ABDOMEN - 1 VIEW

[abdomen kub]
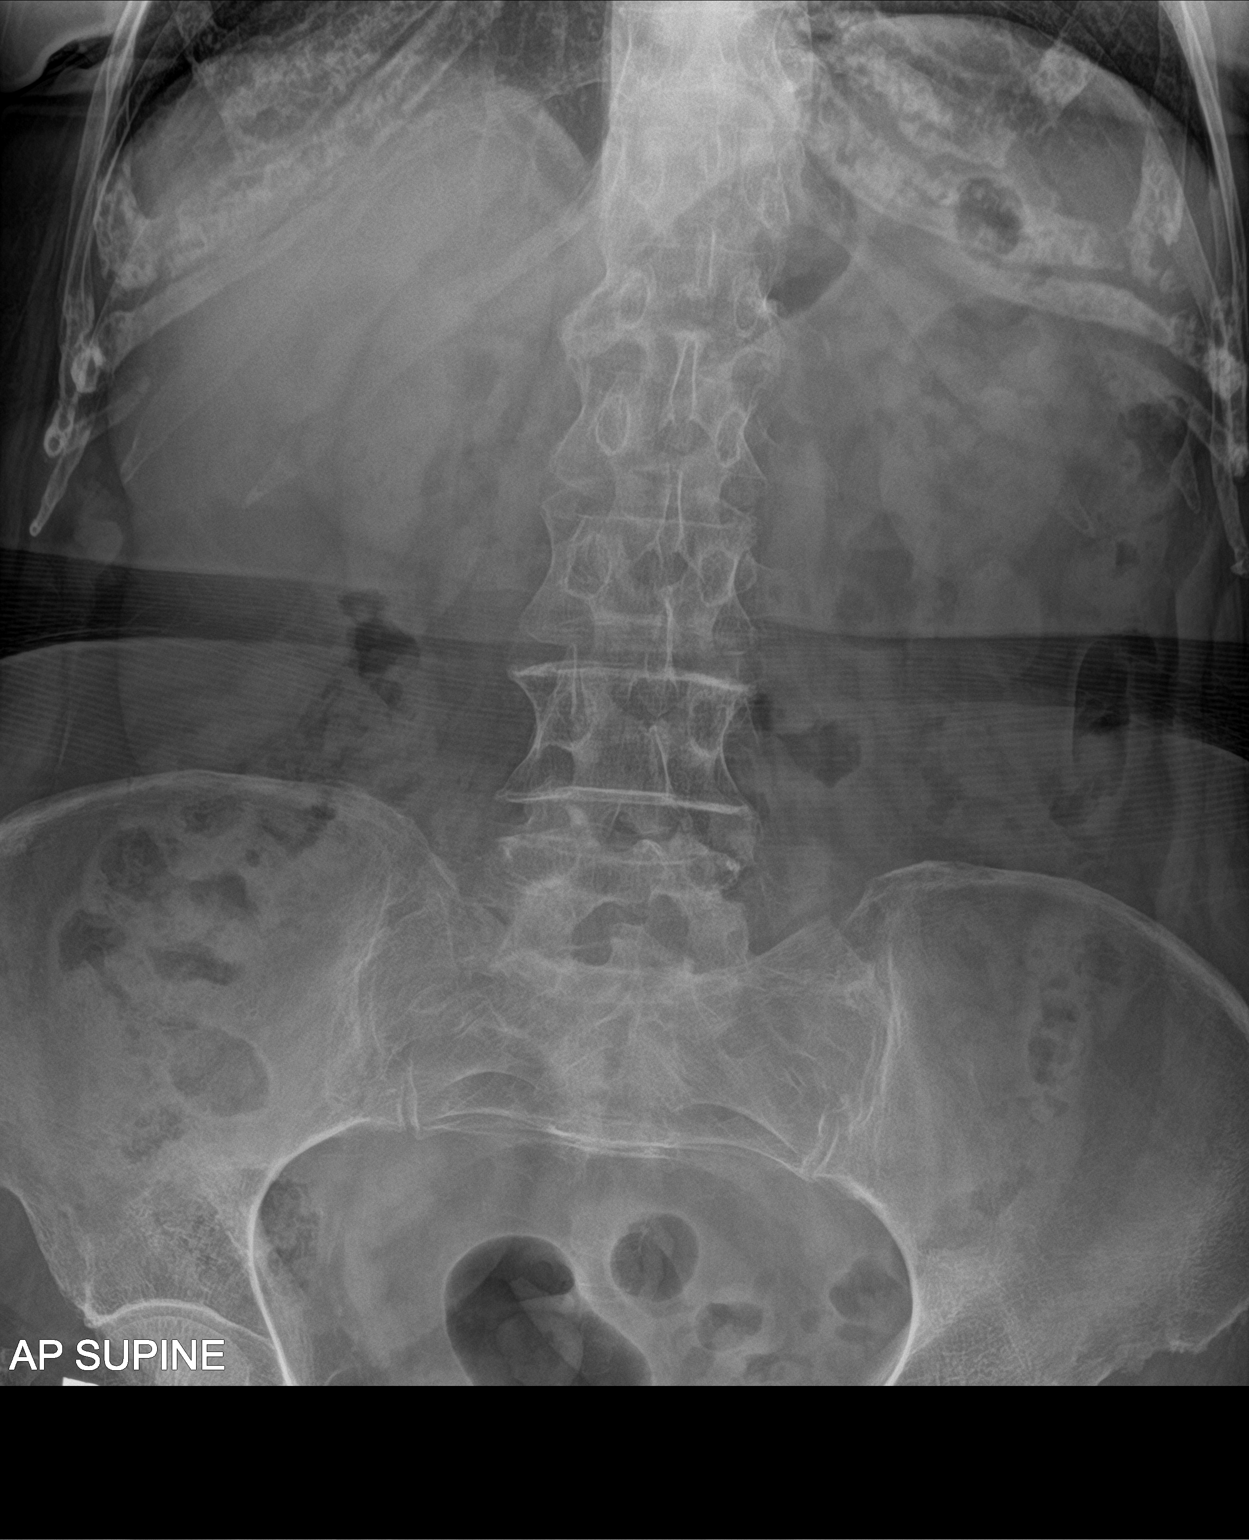

[1 of 1 positions shown; findings below may reference images not displayed]

FINDINGS: Gas and stool are seen scattered throughout the colon extending to
the level of the distal rectum. No pathologic distension of small
bowel is noted. No gross evidence of pneumoperitoneum.
IMPRESSION: 1. Nonobstructive bowel gas pattern.
2. No pneumoperitoneum.

## 2015-09-23 IMAGING — CT CT RENAL STONE PROTOCOL
2 of 4 series · 16 of 46 positions shown, 18 images · non-contrast
Comparison: CT abdomen pelvis dated [DATE]

CLINICAL DATA: Left lower quadrant abdominal pain, chronic
constipation

EXAM:
CT ABDOMEN AND PELVIS WITHOUT CONTRAST
TECHNIQUE: Multidetector CT imaging of the abdomen and pelvis was performed
following the standard protocol without IV contrast.

[Series 2: standard/full over (age)lbs 5.0 · axial · 0.70mm/px · z∈[+632,+1046]mm · 13 of 91 slices shown, 15 images]
[im 4/91  soft-tissue]
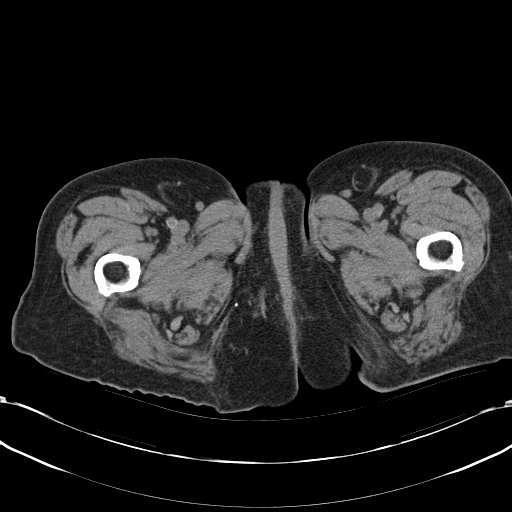
[im 4/91  bone]
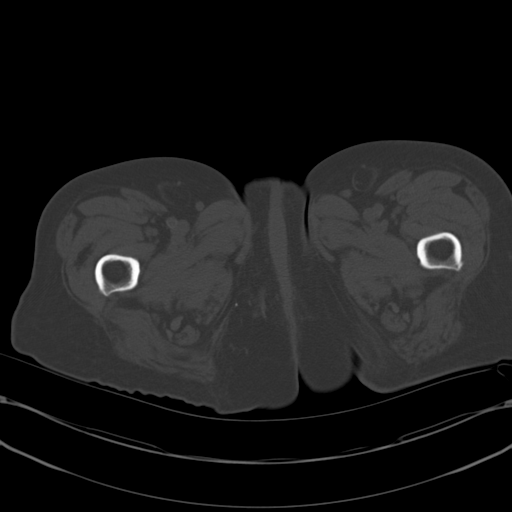
[im 12/91  soft-tissue]
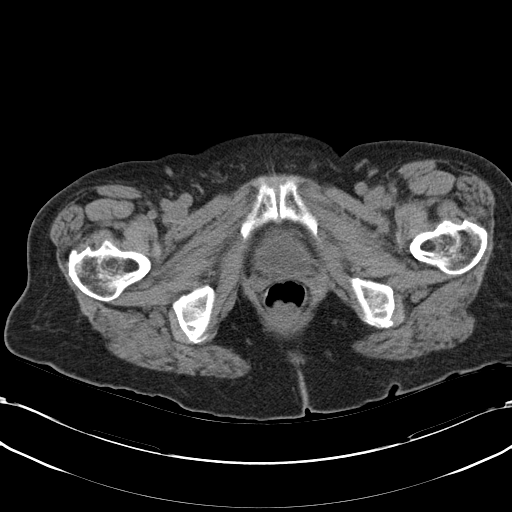
[im 19/91  soft-tissue]
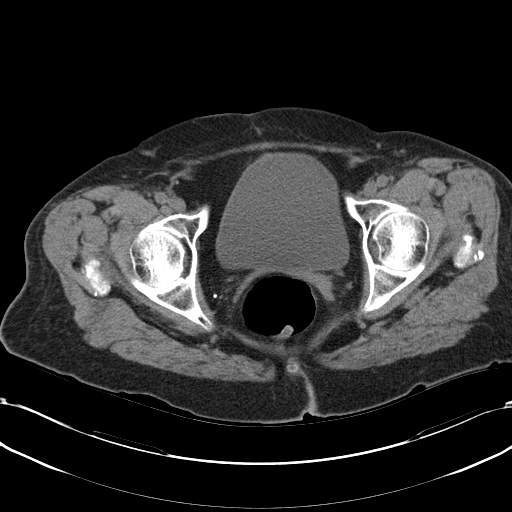
[im 27/91  soft-tissue]
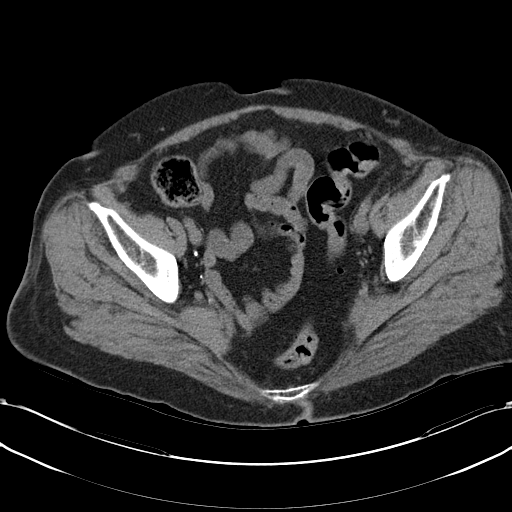
[im 31/91  soft-tissue]
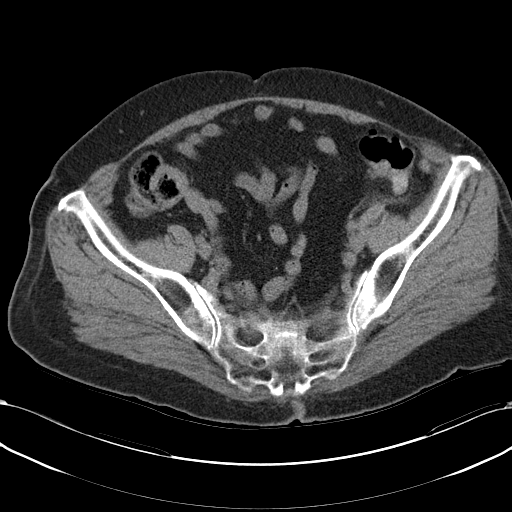
[im 38/91  soft-tissue]
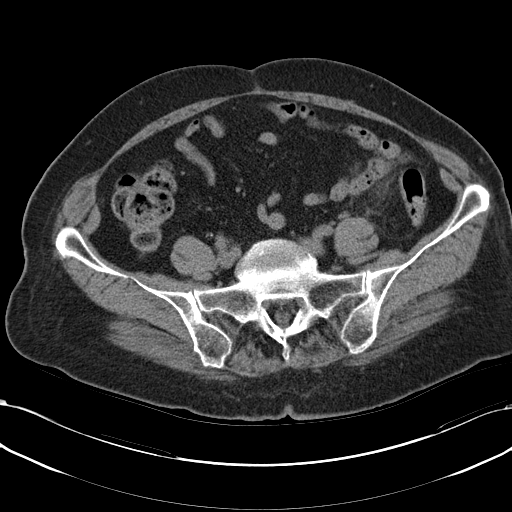
[im 46/91  soft-tissue]
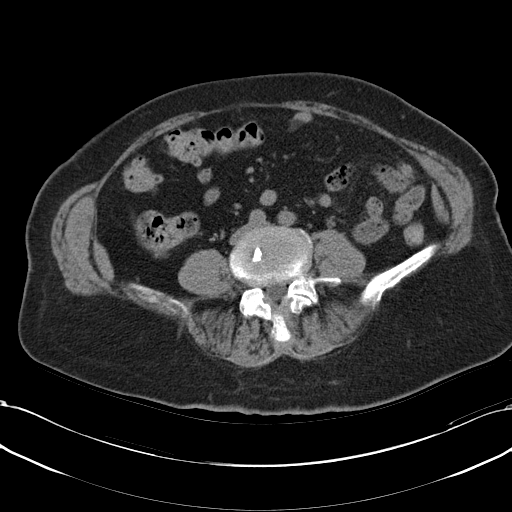
[im 53/91  soft-tissue]
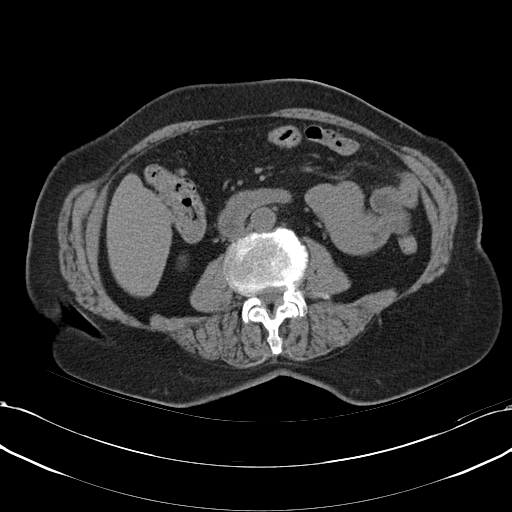
[im 61/91  soft-tissue]
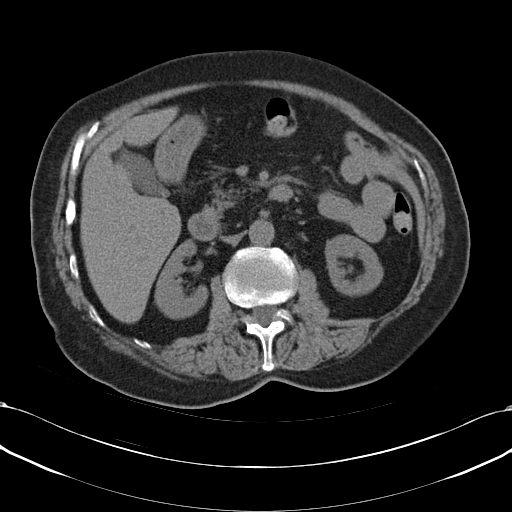
[im 61/91  bone]
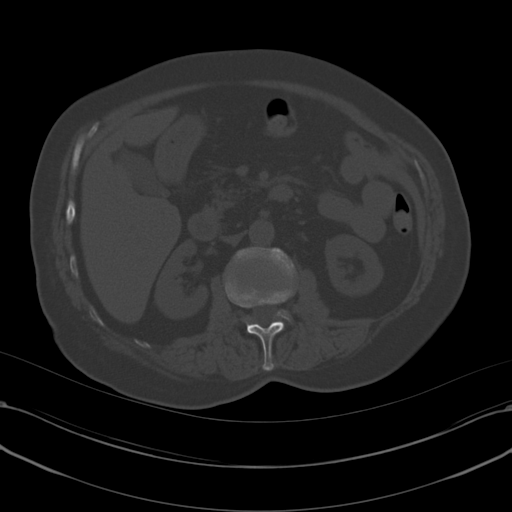
[im 64/91  soft-tissue]
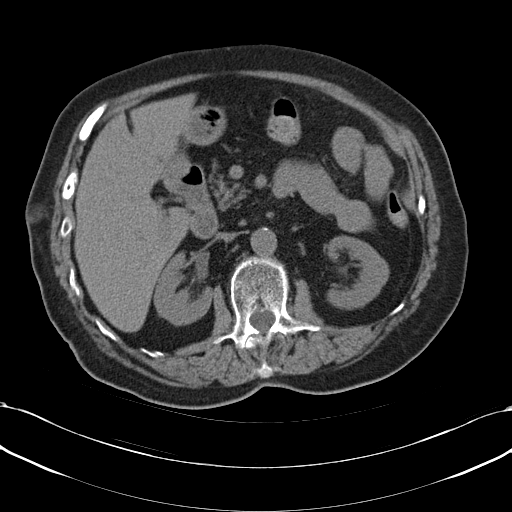
[im 72/91  soft-tissue]
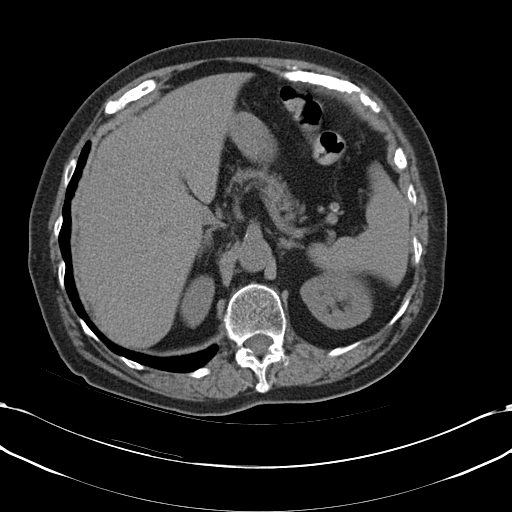
[im 79/91  soft-tissue]
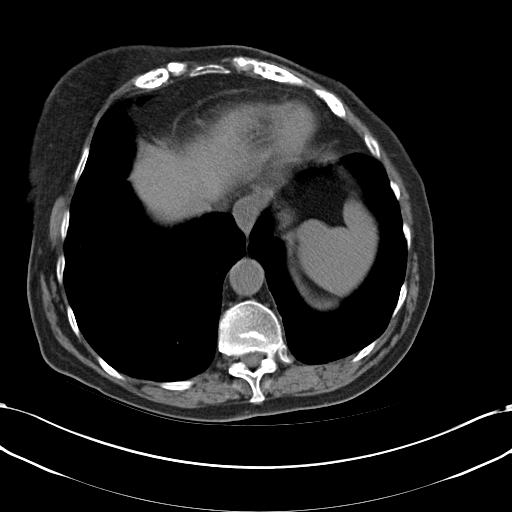
[im 87/91  soft-tissue]
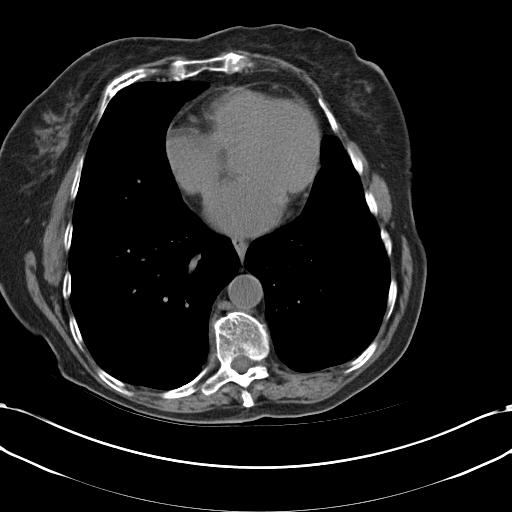

[Series 3: mpr coronal · coronal · 0.74mm/px · 3 of 102 slices shown]
[im 34/102  soft-tissue]
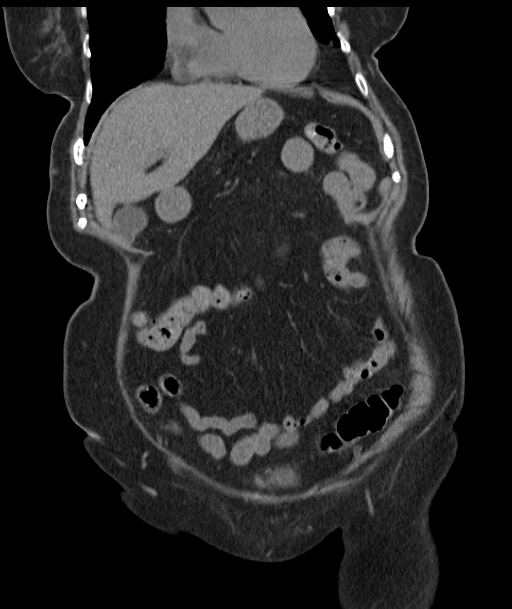
[im 45/102  soft-tissue]
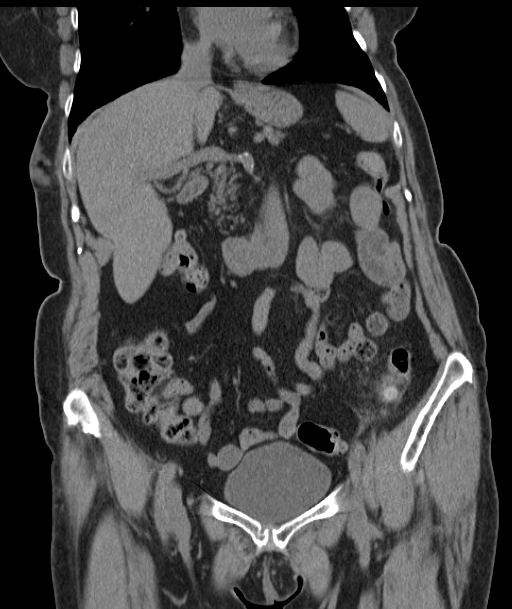
[im 57/102  soft-tissue]
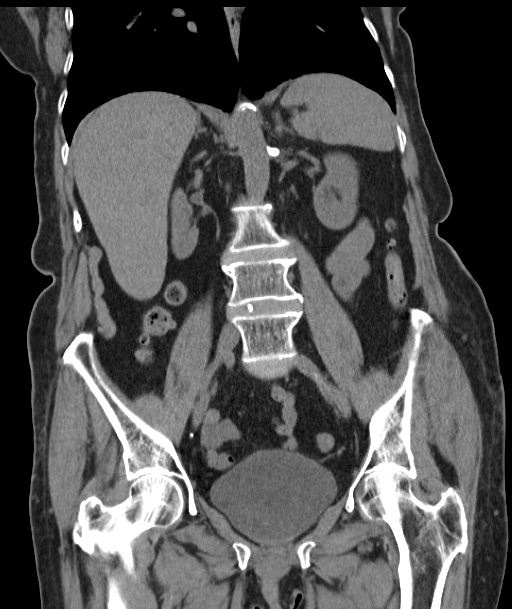

[16 of 46 positions shown; findings below may reference images not displayed]

FINDINGS: Lower chest:  Lung bases are clear.

Hepatobiliary: Unenhanced liver is unremarkable.

Gallbladder is unremarkable. No intrahepatic or extrahepatic ductal
dilatation.

Pancreas: Within normal limits.

Spleen: Within normal limits.

Adrenals/Urinary Tract: Adrenal glands are within normal limits.

Kidneys are within normal limits.

No renal, ureteral, or bladder calculi.  No hydronephrosis.

Bladder is within normal limits.

Stomach/Bowel: Stomach is within normal limits.

No evidence of bowel obstruction.

Sigmoid diverticulosis with associated inflammatory changes in the
left lower quadrant (series 2/ image 58), reflecting mild
diverticulitis.

No drainable fluid collection/ abscess.  No free air.

Vascular/Lymphatic: No evidence of abdominal aortic aneurysm.
Atherosclerotic calcifications of the abdominal aorta and branch
vessels.

No suspicious abdominopelvic lymphadenopathy.

Reproductive: Status post hysterectomy.

No adnexal masses.

Other: No abdominopelvic ascites.

Musculoskeletal: Mild degenerative changes of the visualized
thoracolumbar spine.
IMPRESSION: Mild sigmoid diverticulitis.

No drainable fluid collection/abscess. No free air to suggest
macroscopic perforation.

## 2015-09-23 MED ORDER — AMOXICILLIN-POT CLAVULANATE 875-125 MG PO TABS
1.0000 | ORAL_TABLET | Freq: Two times a day (BID) | ORAL | Status: DC
Start: 2015-09-23 — End: 2016-01-25

## 2015-09-23 MED ORDER — ONDANSETRON 4 MG PO TBDP: ORAL_TABLET | ORAL | Status: DC

## 2015-09-23 NOTE — ED Provider Notes (Addendum)
CSN: [id number]     Arrival date & time 09/23/15  1706 History   First MD Initiated Contact with Patient 09/23/15 1958     Chief Complaint  Patient presents with  . Constipation     (Consider location/radiation/quality/duration/timing/severity/associated sxs/prior Treatment) HPI Comments: 82 year old female with high blood pressure, constipation presents with left lower quadrant pain. Patient had smaller firmer stool earlier today. Patient feels this is more like her diverticulitis history. No fevers or chills, no vomiting, no blood in the stools. Patient feels well otherwise. Tender to palpation  Patient is a 82 y.o. female presenting with constipation. The history is provided by the patient.  Constipation Associated symptoms: abdominal pain   Associated symptoms: no back pain, no dysuria, no fever and no vomiting     Past Medical History  Diagnosis Date  . Hypertension   . Depression     History of Depression- Lexapro therapy  . TIA (transient ischemic attack)     History of TIA's with no residual effect  . Osteoporosis     History of Osteoporosis with one year of Actonel only  . Diverticulosis 02/2005    History of diverticulosis with admission from 8/18 - 03/04/2005  . Cancer ([place]) 03/28/11    SCCA of the Supraglottic Larynx (T2NoMo)    ; S/P Chemoradiation therapy from 05/09/05 thru 06/29/05  . History of chemotherapy     S/P chemotherapy with Dr. Sonny Dandy -2006  . Radiation     S/P radiation thrapy -06/2005  . Hypothyroid    Past Surgical History  Procedure Laterality Date  . Direct laryngoscopy  03/27/2005    S/P direct laryngoscopy, esophagoscopy and biopsy with Dr. Constance Holster  . Tubal ligation    . Total abdominal hysterectomy w/ bilateral salpingoophorectomy  1986  . Cataract extraction, bilateral     Family History  Problem Relation Age of Onset  . Stroke Mother   . Heart disease Mother 44  . Cancer Father 27    Prostate  . Alcohol abuse Brother   . COPD Brother     Social History  Substance Use Topics  . Smoking status: Never Smoker   . Smokeless tobacco: Never Used  . Alcohol Use: No   OB History    No data available     Review of Systems  Constitutional: Negative for fever and chills.  HENT: Negative for congestion.   Eyes: Negative for visual disturbance.  Respiratory: Negative for shortness of breath.   Cardiovascular: Negative for chest pain.  Gastrointestinal: Positive for abdominal pain and constipation. Negative for vomiting.  Genitourinary: Negative for dysuria and flank pain.  Musculoskeletal: Negative for back pain, neck pain and neck stiffness.  Skin: Negative for rash.  Neurological: Negative for light-headedness and headaches.      Allergies  Tuberculin tests and Paroxetine hcl  Home Medications   Prior to Admission medications   Medication Sig Start Date End Date Taking? Authorizing Provider  aspirin EC 81 MG tablet Take 81 mg by mouth at bedtime.   Yes Historical Provider, MD  Calcium Carbonate-Vitamin D (CALCIUM + D PO) Take 1 capsule by mouth 2 (two) times a week.    Yes Historical Provider, MD  escitalopram (LEXAPRO) 10 MG tablet TAKE 1 TABLET BY MOUTH EVERY DAY 09/12/15  Yes [name], MD  ibuprofen (ADVIL,MOTRIN) 200 MG tablet Take 200 mg by mouth every 6 (six) hours as needed for mild pain or moderate pain.   Yes Historical Provider, MD  levothyroxine (SYNTHROID, LEVOTHROID) 50  MCG tablet Take 50 mcg by mouth daily. 04/25/15  Yes Historical Provider, MD  metoprolol succinate (TOPROL-XL) 50 MG 24 hr tablet Take 50 mg by mouth daily. Take with or immediately following a meal.   Yes Historical Provider, MD  polyethylene glycol powder (GLYCOLAX/MIRALAX) powder Take 17 g by mouth 2 (two) times daily as needed for moderate constipation. For constipation Patient taking differently: Take 17 g by mouth 2 (two) times daily. For constipation 07/12/15  Yes [name], MD  ranitidine (ZANTAC) 150 MG tablet Take 150 mg  by mouth every 12 (twelve) hours. 05/17/14  Yes Historical Provider, MD  tetrahydrozoline-zinc (VISINE-AC) 0.05-0.25 % ophthalmic solution Apply 2 drops to eye 3 (three) times daily as needed (for irritation).   Yes Historical Provider, MD  amoxicillin-clavulanate (AUGMENTIN) 875-125 MG tablet Take 1 tablet by mouth 2 (two) times daily. One po bid x 7 days 09/23/15   Elnora Morrison, MD  ondansetron (ZOFRAN ODT) 4 MG disintegrating tablet 4mg  ODT q4 hours prn nausea/vomit 09/23/15   Elnora Morrison, MD   BP 191/73 mmHg  Pulse 81  Temp(Src) 98.6 F (37 C)  Resp 16  Ht 5\' 1"  (1.549 m)  Wt 138 lb (62.596 kg)  BMI 26.09 kg/m2  SpO2 96% Physical Exam  Constitutional: She is oriented to person, place, and time. She appears well-developed and well-nourished.  HENT:  Head: Normocephalic and atraumatic.  Eyes: Conjunctivae are normal. Right eye exhibits no discharge. Left eye exhibits no discharge.  Neck: Normal range of motion. Neck supple. No tracheal deviation present.  Cardiovascular: Normal rate and regular rhythm.   Pulmonary/Chest: Effort normal and breath sounds normal.  Abdominal: Soft. She exhibits no distension. There is tenderness (mild left lower quadrant). There is no guarding.  Musculoskeletal: She exhibits no edema.  Neurological: She is alert and oriented to person, place, and time.  Skin: Skin is warm. No rash noted.  Psychiatric: She has a normal mood and affect.  Nursing note and vitals reviewed.   ED Course  Procedures (including critical care time) Labs Review Labs Reviewed - No data to display  Imaging Review No results found. I have personally reviewed and evaluated these images and lab results as part of my medical decision-making.   EKG Interpretation None      MDM   Final diagnoses:  Left lower quadrant pain  Acute diverticulitis   Elderly patient presents with left lower quadrant pain. Discussed differential diagnosis with patient including constipation,  colitis, diverticulitis, kidney stone. Patient had screening x-ray done in triage with no significant stool burden. Patient does feel this is similar to diverticulitis, CT scan stone study performed as patient very well appearing  Well appearing.  Mild diverticulitis. PO abx and outpt fup.  Results and differential diagnosis were discussed with the patient/parent/guardian. Xrays were independently reviewed by myself.  Close follow up outpatient was discussed, comfortable with the plan.   Medications - No data to display  Filed Vitals:   09/23/15 1726 09/23/15 1955  BP: 134/94 191/73  Pulse: 76 81  Temp: 98.6 F (37 C)   Resp: 18 16  Height: 5\' 1"  (1.549 m)   Weight: 138 lb (62.596 kg)   SpO2: 99% 96%    Final diagnoses:  Left lower quadrant pain  Acute diverticulitis        Elnora Morrison, MD 09/26/15 [id number]  Elnora Morrison, MD 09/26/15 [phone]

## 2015-09-23 NOTE — Discharge Instructions (Signed)
If you were given medicines take as directed.  If you are on coumadin or contraceptives realize their levels and effectiveness is altered by many different medicines.  If you have any reaction (rash, tongues swelling, other) to the medicines stop taking and see a physician.    If your blood pressure was elevated in the ER make sure you follow up for management with a primary doctor or return for chest pain, shortness of breath or stroke symptoms.  Please follow up as directed and return to the ER or see a physician for new or worsening symptoms.  Thank you. Filed Vitals:   09/23/15 1726 09/23/15 1955  BP: 134/94 191/73  Pulse: 76 81  Temp: 98.6 F (37 C)   Resp: 18 16  Height: 5\' 1"  (1.549 m)   Weight: 138 lb (62.596 kg)   SpO2: 99% 96%

## 2015-09-23 NOTE — ED Notes (Signed)
PT c/o left lower abdominal pain with chronic constipation x2 weeks. PT states she hasn't been taking her miralax bid as ordered and has had small BM hard this am.

## 2015-10-10 ENCOUNTER — Other Ambulatory Visit: Payer: Medicare Other | Admitting: Family Medicine

## 2015-10-12 ENCOUNTER — Other Ambulatory Visit: Payer: Self-pay | Admitting: Family Medicine

## 2015-11-08 ENCOUNTER — Other Ambulatory Visit: Payer: Self-pay | Admitting: Family Medicine

## 2015-11-08 NOTE — Telephone Encounter (Signed)
Last seen 07/12/15  Dr Livia Snellen

## 2015-12-06 ENCOUNTER — Other Ambulatory Visit: Payer: Self-pay | Admitting: Family Medicine

## 2016-01-02 ENCOUNTER — Other Ambulatory Visit: Payer: Self-pay | Admitting: Family Medicine

## 2016-01-25 ENCOUNTER — Ambulatory Visit (INDEPENDENT_AMBULATORY_CARE_PROVIDER_SITE_OTHER): Payer: Medicare Other | Admitting: Family Medicine

## 2016-01-25 ENCOUNTER — Encounter: Payer: Self-pay | Admitting: Family Medicine

## 2016-01-25 VITALS — BP 154/66 | HR 66 | Temp 97.7°F | Ht 61.0 in | Wt 137.2 lb

## 2016-01-25 DIAGNOSIS — E559 Vitamin D deficiency, unspecified: Secondary | ICD-10-CM | POA: Diagnosis not present

## 2016-01-25 DIAGNOSIS — E039 Hypothyroidism, unspecified: Secondary | ICD-10-CM

## 2016-01-25 DIAGNOSIS — I209 Angina pectoris, unspecified: Secondary | ICD-10-CM

## 2016-01-25 DIAGNOSIS — I10 Essential (primary) hypertension: Secondary | ICD-10-CM | POA: Diagnosis not present

## 2016-01-25 DIAGNOSIS — E538 Deficiency of other specified B group vitamins: Secondary | ICD-10-CM

## 2016-01-25 NOTE — Progress Notes (Signed)
.  Subjective:  Patient ID: Crystal Browning, female    DOB: 07-23-1933  Age: 82 y.o. MRN: [id number]  CC: Hypothyroidism; Hypertension; Gastroesophageal Reflux; and Depression   HPI Crystal Browning presents for  follow-up of hypertension. Patient has no history of headache or shortness of breath or recent cough. Angina symptoms under control, with no current symptoms as long as taking metoprolol for both that and  blood pressure. Patient also denies symptoms of TIA such as numbness weakness lateralizing. Patient does not check  blood pressure at home. Patient denies side effects from medication. States taking it regularly.  Patient states the depression is under good control. Escitalopram does help her to maintain an even temperament. [place] Q survey from today noted below along with previous result:  Depression screen [facility] 2/9 01/25/2016 07/12/2015  Decreased Interest 1 0  Down, Depressed, Hopeless 1 0  PHQ - 2 Score 2 0  Altered sleeping 0 -  Tired, decreased energy 1 -  Change in appetite 1 -  Feeling bad or failure about yourself  0 -  Trouble concentrating 0 -  Moving slowly or fidgety/restless 0 -  Suicidal thoughts 0 -  PHQ-9 Score 4 -  Difficult doing work/chores Not difficult at all -    Patient presents for follow-up on  thyroid. The patient has a history of hypothyroidism for many years. It has been stable recently. Pt. denies any change in  voice, loss of hair, heat or cold intolerance. Energy level has been adequate to good. Patient denies constipation and diarrhea. No myxedema. Medication is as noted below. Verified that pt is taking it daily on an empty stomach. Well tolerated.  Patient noted recently to have a deficiency of vitamin D. It's unclear whether she's followed the recommendations regarding that. She is a limited historian. However, her daughter mentions that she's had B12 deficiency in the past and is no longer getting the B12 shots that she once had to  take.   History Crystal Browning has a past medical history of Hypertension; Depression; TIA (transient ischemic attack); Osteoporosis; Diverticulosis (02/2005); Cancer ([place]) (03/28/11); History of chemotherapy; Radiation; and Hypothyroid.   She has past surgical history that includes Direct laryngoscopy (03/27/2005); Tubal ligation; Total abdominal hysterectomy w/ bilateral salpingoophorectomy (1986); and Cataract extraction, bilateral.   Her family history includes Alcohol abuse in her brother; COPD in her brother; Cancer (age of onset: 63) in her father; Heart disease (age of onset: 56) in her mother; Stroke in her mother.She reports that she has never smoked. She has never used smokeless tobacco. She reports that she does not drink alcohol or use illicit drugs.  Current Outpatient Prescriptions on File Prior to Visit  Medication Sig Dispense Refill  . aspirin EC 81 MG tablet Take 81 mg by mouth at bedtime.    . Calcium Carbonate-Vitamin D (CALCIUM + D PO) Take 1 capsule by mouth 2 (two) times a week.     . escitalopram (LEXAPRO) 10 MG tablet TAKE 1 TABLET BY MOUTH EVERY DAY 30 tablet 0  . ibuprofen (ADVIL,MOTRIN) 200 MG tablet Take 200 mg by mouth every 6 (six) hours as needed for mild pain or moderate pain.    Marland Kitchen levothyroxine (SYNTHROID, LEVOTHROID) 50 MCG tablet Take 50 mcg by mouth daily.  3  . metoprolol succinate (TOPROL-XL) 50 MG 24 hr tablet Take 50 mg by mouth daily. Take with or immediately following a meal.    . polyethylene glycol powder (GLYCOLAX/MIRALAX) powder Take 17 g by mouth  2 (two) times daily as needed for moderate constipation. For constipation (Patient taking differently: Take 17 g by mouth 2 (two) times daily. For constipation) 3350 g 5  . ranitidine (ZANTAC) 150 MG tablet Take 150 mg by mouth every 12 (twelve) hours.  3  . tetrahydrozoline-zinc (VISINE-AC) 0.05-0.25 % ophthalmic solution Apply 2 drops to eye 3 (three) times daily as needed (for irritation).     No current  facility-administered medications on file prior to visit.    ROS Review of Systems  Constitutional: Negative for fever, activity change and appetite change.  HENT: Negative for congestion, rhinorrhea and sore throat.   Eyes: Negative for visual disturbance.  Respiratory: Negative for cough and shortness of breath.   Cardiovascular: Negative for chest pain and palpitations.  Gastrointestinal: Negative for nausea, abdominal pain and diarrhea.  Genitourinary: Negative for dysuria.  Musculoskeletal: Negative for myalgias and arthralgias.    Objective:  BP 154/66 mmHg  Pulse 66  Temp(Src) 97.7 F (36.5 C) (Oral)  Ht '5\' 1"'  (1.549 m)  Wt 137 lb 3.2 oz (62.234 kg)  BMI 25.94 kg/m2  SpO2 99%  BP Readings from Last 3 Encounters:  01/25/16 154/66  09/23/15 191/73  07/12/15 101/65    Wt Readings from Last 3 Encounters:  01/25/16 137 lb 3.2 oz (62.234 kg)  09/23/15 138 lb (62.596 kg)  07/12/15 138 lb 9.6 oz (62.869 kg)     Physical Exam  Constitutional: She is oriented to person, place, and time. She appears well-developed and well-nourished. No distress.  HENT:  Head: Normocephalic and atraumatic.  Right Ear: External ear normal.  Left Ear: External ear normal.  Nose: Nose normal.  Mouth/Throat: Oropharynx is clear and moist.  Eyes: Conjunctivae and EOM are normal. Pupils are equal, round, and reactive to light.  Neck: Normal range of motion. Neck supple. No thyromegaly present.  Cardiovascular: Normal rate, regular rhythm and normal heart sounds.   No murmur heard. Pulmonary/Chest: Effort normal and breath sounds normal. No respiratory distress. She has no wheezes. She has no rales.  Abdominal: Soft. Bowel sounds are normal. She exhibits no distension. There is no tenderness.  Lymphadenopathy:    She has no cervical adenopathy.  Neurological: She is alert and oriented to person, place, and time. She has normal reflexes.  Skin: Skin is warm and dry.  Psychiatric: She has a  normal mood and affect. Her behavior is normal. Judgment and thought content normal.     Lab Results  Component Value Date   WBC 5.2 07/12/2015   HGB 12.0 06/26/2014   HCT 36.9 07/12/2015   PLT 259 07/12/2015   GLUCOSE 87 07/12/2015   CHOL 192 07/12/2015   TRIG 161* 07/12/2015   HDL 51 07/12/2015   LDLCALC 109* 07/12/2015   ALT 15 07/12/2015   AST 24 07/12/2015   NA 140 07/12/2015   K 4.2 07/12/2015   CL 103 07/12/2015   CREATININE 0.82 07/12/2015   BUN 11 07/12/2015   CO2 23 07/12/2015   TSH 3.480 07/12/2015    Dg Abd 1 View  09/23/2015  CLINICAL DATA:  82 year old female with history of left lower quadrant abdominal pain since yesterday. No associated nausea, vomiting or diarrhea. EXAM: ABDOMEN - 1 VIEW COMPARISON:  Abdominal radiograph 07/12/2015. FINDINGS: Gas and stool are seen scattered throughout the colon extending to the level of the distal rectum. No pathologic distension of small bowel is noted. No gross evidence of pneumoperitoneum. IMPRESSION: 1. Nonobstructive bowel gas pattern. 2. No pneumoperitoneum. Electronically  Signed   By: Vinnie Langton M.D.   On: 09/23/2015 18:11   Ct Renal Stone Study  09/23/2015  CLINICAL DATA:  Left lower quadrant abdominal pain, chronic constipation EXAM: CT ABDOMEN AND PELVIS WITHOUT CONTRAST TECHNIQUE: Multidetector CT imaging of the abdomen and pelvis was performed following the standard protocol without IV contrast. COMPARISON:  CT abdomen pelvis dated 04/19/2010 FINDINGS: Lower chest:  Lung bases are clear. Hepatobiliary: Unenhanced liver is unremarkable. Gallbladder is unremarkable. No intrahepatic or extrahepatic ductal dilatation. Pancreas: Within normal limits. Spleen: Within normal limits. Adrenals/Urinary Tract: Adrenal glands are within normal limits. Kidneys are within normal limits. No renal, ureteral, or bladder calculi.  No hydronephrosis. Bladder is within normal limits. Stomach/Bowel: Stomach is within normal limits. No  evidence of bowel obstruction. Sigmoid diverticulosis with associated inflammatory changes in the left lower quadrant (series 2/ image 58), reflecting mild diverticulitis. No drainable fluid collection/ abscess.  No free air. Vascular/Lymphatic: No evidence of abdominal aortic aneurysm. Atherosclerotic calcifications of the abdominal aorta and branch vessels. No suspicious abdominopelvic lymphadenopathy. Reproductive: Status post hysterectomy. No adnexal masses. Other: No abdominopelvic ascites. Musculoskeletal: Mild degenerative changes of the visualized thoracolumbar spine. IMPRESSION: Mild sigmoid diverticulitis. No drainable fluid collection/abscess. No free air to suggest macroscopic perforation. Electronically Signed   By: Julian Hy M.D.   On: 09/23/2015 21:17    Assessment & Plan:   Ambika was seen today for hypothyroidism, hypertension, gastroesophageal reflux and depression.  Diagnoses and all orders for this visit:  Essential hypertension -     CBC with Differential/Platelet -     CMP14+EGFR -     TSH + free T4 -     VITAMIN D 25 Hydroxy (Vit-D Deficiency, Fractures)  Hypothyroidism, unspecified hypothyroidism type -     CBC with Differential/Platelet -     CMP14+EGFR -     TSH + free T4 -     VITAMIN D 25 Hydroxy (Vit-D Deficiency, Fractures)  Vitamin D deficiency -     VITAMIN D 25 Hydroxy (Vit-D Deficiency, Fractures)  Angina pectoris (HCC)  Vitamin B12 deficiency -     Vitamin B12   I have discontinued Ms. Manygoats's amoxicillin-clavulanate and ondansetron. I am also having her maintain her metoprolol succinate, Calcium Carbonate-Vitamin D (CALCIUM + D PO), ranitidine, aspirin EC, levothyroxine, tetrahydrozoline-zinc, ibuprofen, polyethylene glycol powder, and escitalopram.   Follow-up: Return in about 6 months (around 07/27/2016) for CPE.  [name], M.D.

## 2016-01-26 ENCOUNTER — Other Ambulatory Visit: Payer: Self-pay | Admitting: Family Medicine

## 2016-01-26 LAB — CBC WITH DIFFERENTIAL/PLATELET
BASOS: 0 %
Basophils Absolute: 0 10*3/uL (ref 0.0–0.2)
EOS (ABSOLUTE): 0.2 10*3/uL (ref 0.0–0.4)
EOS: 5 %
Hematocrit: 36 % (ref 34.0–46.6)
Hemoglobin: 11.7 g/dL (ref 11.1–15.9)
Immature Grans (Abs): 0 10*3/uL (ref 0.0–0.1)
Immature Granulocytes: 0 %
Lymphocytes Absolute: 1.1 10*3/uL (ref 0.7–3.1)
Lymphs: 23 %
MCH: 30.6 pg (ref 26.6–33.0)
MCHC: 32.5 g/dL (ref 31.5–35.7)
MCV: 94 fL (ref 79–97)
MONOS ABS: 0.5 10*3/uL (ref 0.1–0.9)
Monocytes: 11 %
NEUTROS ABS: 3 10*3/uL (ref 1.4–7.0)
Neutrophils: 61 %
Platelets: 273 10*3/uL (ref 150–379)
RBC: 3.82 x10E6/uL (ref 3.77–5.28)
RDW: 14.2 % (ref 12.3–15.4)
WBC: 4.8 10*3/uL (ref 3.4–10.8)

## 2016-01-26 LAB — CMP14+EGFR
A/G RATIO: 1.7 (ref 1.2–2.2)
ALK PHOS: 71 IU/L (ref 39–117)
ALT: 15 IU/L (ref 0–32)
AST: 23 IU/L (ref 0–40)
Albumin: 4.5 g/dL (ref 3.5–4.7)
BUN/Creatinine Ratio: 14 (ref 12–28)
BUN: 12 mg/dL (ref 8–27)
Bilirubin Total: 0.4 mg/dL (ref 0.0–1.2)
CO2: 25 mmol/L (ref 18–29)
CREATININE: 0.84 mg/dL (ref 0.57–1.00)
Calcium: 9.4 mg/dL (ref 8.7–10.3)
Chloride: 101 mmol/L (ref 96–106)
GFR calc Af Amer: 76 mL/min/{1.73_m2} (ref >59)
GFR calc non Af Amer: 66 mL/min/{1.73_m2} (ref >59)
Globulin, Total: 2.6 g/dL (ref 1.5–4.5)
Glucose: 91 mg/dL (ref 65–99)
Potassium: 4.5 mmol/L (ref 3.5–5.2)
SODIUM: 140 mmol/L (ref 134–144)
Total Protein: 7.1 g/dL (ref 6.0–8.5)

## 2016-01-26 LAB — VITAMIN D 25 HYDROXY (VIT D DEFICIENCY, FRACTURES): Vit D, 25-Hydroxy: 20.9 ng/mL — ABNORMAL LOW (ref 30.0–100.0)

## 2016-01-26 LAB — TSH+FREE T4
Free T4: 1.24 ng/dL (ref 0.82–1.77)
TSH: 2.39 u[IU]/mL (ref 0.450–4.500)

## 2016-01-26 LAB — VITAMIN B12: Vitamin B-12: 131 pg/mL — ABNORMAL LOW (ref 211–946)

## 2016-01-26 MED ORDER — VITAMIN D (ERGOCALCIFEROL) 1.25 MG (50000 UNIT) PO CAPS: 50000.0000 [IU] | ORAL_CAPSULE | ORAL | Status: DC

## 2016-01-27 ENCOUNTER — Ambulatory Visit: Payer: Medicare Other

## 2016-01-29 ENCOUNTER — Other Ambulatory Visit: Payer: Self-pay | Admitting: Family Medicine

## 2016-02-29 ENCOUNTER — Other Ambulatory Visit: Payer: Self-pay | Admitting: Family Medicine

## 2016-03-21 ENCOUNTER — Ambulatory Visit (INDEPENDENT_AMBULATORY_CARE_PROVIDER_SITE_OTHER): Payer: Medicare Other | Admitting: Family Medicine

## 2016-03-21 ENCOUNTER — Encounter: Payer: Self-pay | Admitting: Family Medicine

## 2016-03-21 VITALS — BP 138/82 | HR 68 | Temp 96.9°F | Ht 61.0 in | Wt 138.6 lb

## 2016-03-21 DIAGNOSIS — I209 Angina pectoris, unspecified: Secondary | ICD-10-CM

## 2016-03-21 DIAGNOSIS — L03119 Cellulitis of unspecified part of limb: Secondary | ICD-10-CM | POA: Diagnosis not present

## 2016-03-21 DIAGNOSIS — L209 Atopic dermatitis, unspecified: Secondary | ICD-10-CM | POA: Diagnosis not present

## 2016-03-21 MED ORDER — DOXYCYCLINE HYCLATE 100 MG PO TABS: 100.0000 mg | ORAL_TABLET | Freq: Two times a day (BID) | ORAL | 0 refills | Status: DC

## 2016-03-21 MED ORDER — PREDNISONE 20 MG PO TABS: ORAL_TABLET | ORAL | 0 refills | Status: DC

## 2016-03-21 NOTE — Progress Notes (Signed)
BP 138/82   Pulse 68   Temp (!) 96.9 F (36.1 C) (Oral)   Ht 5\' 1"  (1.549 m)   Wt 138 lb 9.6 oz (62.9 kg)   BMI 26.19 kg/m    Subjective:    Patient ID: Crystal Browning, female    DOB: [birth year], 82 y.o.   MRN: [id number]  HPI: Crystal Browning is a 82 y.o. female presenting on 03/21/2016 for Rash (itchy rash all over body, began 2 days ago)   HPI Rash Patient has a rash that is extended over her body, sparing her groin and palms and soles. The rash is very pruritic. Initially looks like she has small bites and then she scratches at them a lot and then he developed swelling around the area. She denies any fevers or chills in it from any of the sites. The rash is worse in areas where she can reach to scratch. It also spares her upper back. The rash is been worsening over the past 2 days and is even started to keep her up at night because of the scratching and itchiness.  Relevant past medical, surgical, family and social history reviewed and updated as indicated. Interim medical history since our last visit reviewed. Allergies and medications reviewed and updated.  Review of Systems  Constitutional: Negative for chills and fever.  HENT: Negative for congestion, ear discharge and ear pain.   Eyes: Negative for redness and visual disturbance.  Respiratory: Negative for chest tightness and shortness of breath.   Cardiovascular: Negative for chest pain and leg swelling.  Genitourinary: Negative for difficulty urinating and dysuria.  Musculoskeletal: Negative for back pain and gait problem.  Skin: Positive for rash.  Neurological: Negative for light-headedness and headaches.  Psychiatric/Behavioral: Negative for agitation and behavioral problems.  All other systems reviewed and are negative.   Per HPI unless specifically indicated above     Medication List       Accurate as of 03/21/16 10:13 AM. Always use your most recent med list.          aspirin EC 81 MG tablet Take 81 mg  by mouth at bedtime.   CALCIUM + D PO Take 1 capsule by mouth 2 (two) times a week.   doxycycline 100 MG tablet Commonly known as:  VIBRA-TABS Take 1 tablet (100 mg total) by mouth 2 (two) times daily. 1 po bid   escitalopram 10 MG tablet Commonly known as:  LEXAPRO TAKE 1 TABLET BY MOUTH EVERY DAY   ibuprofen 200 MG tablet Commonly known as:  ADVIL,MOTRIN Take 200 mg by mouth every 6 (six) hours as needed for mild pain or moderate pain.   levothyroxine 50 MCG tablet Commonly known as:  SYNTHROID, LEVOTHROID Take 50 mcg by mouth daily.   metoprolol succinate 50 MG 24 hr tablet Commonly known as:  TOPROL-XL TAKE 1 TABLET EVERY DAY   polyethylene glycol powder powder Commonly known as:  GLYCOLAX/MIRALAX Take 17 g by mouth 2 (two) times daily as needed for moderate constipation. For constipation   predniSONE 20 MG tablet Commonly known as:  DELTASONE 2 po at same time daily for 5 days   ranitidine 150 MG tablet Commonly known as:  ZANTAC Take 150 mg by mouth every 12 (twelve) hours.   tetrahydrozoline-zinc 0.05-0.25 % ophthalmic solution Commonly known as:  VISINE-AC Apply 2 drops to eye 3 (three) times daily as needed (for irritation).   Vitamin D (Ergocalciferol) 50000 units Caps capsule Commonly known as:  DRISDOL  Take 1 capsule (50,000 Units total) by mouth 2 (two) times a week.          Objective:    BP 138/82   Pulse 68   Temp (!) 96.9 F (36.1 C) (Oral)   Ht 5\' 1"  (1.549 m)   Wt 138 lb 9.6 oz (62.9 kg)   BMI 26.19 kg/m   Wt Readings from Last 3 Encounters:  03/21/16 138 lb 9.6 oz (62.9 kg)  01/25/16 137 lb 3.2 oz (62.2 kg)  09/23/15 138 lb (62.6 kg)    Physical Exam  Constitutional: She is oriented to person, place, and time. She appears well-developed and well-nourished. No distress.  Eyes: Conjunctivae are normal.  Cardiovascular: Normal rate, regular rhythm, normal heart sounds and intact distal pulses.   No murmur heard. Pulmonary/Chest:  Effort normal and breath sounds normal. No respiratory distress. She has no wheezes.  Musculoskeletal: Normal range of motion. She exhibits no edema or tenderness.  Neurological: She is alert and oriented to person, place, and time. Coordination normal.  Skin: Skin is warm and dry. Rash (Small papules with surrounding swelling and warmth on some of the ones on her forearms and around her elbow. These ones have some concern of developing cellulitis. No drainage or induration or warmth. Many excoriations over her body) noted. She is not diaphoretic.  Psychiatric: She has a normal mood and affect. Her behavior is normal.  Nursing note and vitals reviewed.     Assessment & Plan:   Problem List Items Addressed This Visit    None    Visit Diagnoses    Atopic dermatitis    -  Primary   Patient appears to be having some kind of reaction to arthropod like bites that are covering her body with some overlying cellulitis   Relevant Medications   predniSONE (DELTASONE) 20 MG tablet   doxycycline (VIBRA-TABS) 100 MG tablet   Cellulitis of upper extremity, unspecified laterality           Follow up plan: Return if symptoms worsen or fail to improve.  Counseling provided for all of the vaccine components No orders of the defined types were placed in this encounter.   Caryl Pina, MD [facility] 03/21/2016, 10:13 AM

## 2016-04-23 ENCOUNTER — Other Ambulatory Visit: Payer: Self-pay | Admitting: Family Medicine

## 2016-05-20 ENCOUNTER — Other Ambulatory Visit: Payer: Self-pay | Admitting: Family Medicine

## 2016-05-21 ENCOUNTER — Encounter (HOSPITAL_COMMUNITY): Payer: Self-pay | Admitting: Dentistry

## 2016-05-23 ENCOUNTER — Other Ambulatory Visit: Payer: Self-pay | Admitting: Family Medicine

## 2016-05-24 ENCOUNTER — Other Ambulatory Visit: Payer: Self-pay | Admitting: Family Medicine

## 2016-05-28 ENCOUNTER — Encounter (INDEPENDENT_AMBULATORY_CARE_PROVIDER_SITE_OTHER): Payer: Self-pay

## 2016-05-28 ENCOUNTER — Ambulatory Visit (HOSPITAL_COMMUNITY): Payer: Medicaid - Dental | Admitting: Dentistry

## 2016-05-28 ENCOUNTER — Encounter (HOSPITAL_COMMUNITY): Payer: Self-pay | Admitting: Dentistry

## 2016-05-28 VITALS — BP 155/70 | HR 68 | Temp 98.4°F

## 2016-05-28 DIAGNOSIS — Z463 Encounter for fitting and adjustment of dental prosthetic device: Secondary | ICD-10-CM

## 2016-05-28 DIAGNOSIS — K0820 Unspecified atrophy of edentulous alveolar ridge: Secondary | ICD-10-CM

## 2016-05-28 DIAGNOSIS — Z418 Encounter for other procedures for purposes other than remedying health state: Secondary | ICD-10-CM

## 2016-05-28 DIAGNOSIS — K08109 Complete loss of teeth, unspecified cause, unspecified class: Secondary | ICD-10-CM

## 2016-05-28 DIAGNOSIS — M264 Malocclusion, unspecified: Secondary | ICD-10-CM

## 2016-05-28 DIAGNOSIS — R682 Dry mouth, unspecified: Secondary | ICD-10-CM

## 2016-05-28 DIAGNOSIS — Z972 Presence of dental prosthetic device (complete) (partial): Secondary | ICD-10-CM

## 2016-05-28 DIAGNOSIS — K117 Disturbances of salivary secretion: Secondary | ICD-10-CM

## 2016-05-28 NOTE — Progress Notes (Signed)
05/28/16  Patient:            Crystal Browning Date of Birth:  10/22/1933 MRN:                [id number]  BP (!) 155/70 (BP Location: Left Arm)   Pulse 68   Temp 98.4 F (36.9 C) (Oral)    Crystal Browning presents for periodic oral exam and evaluation of upper and lower complete dentures. Patient denies any medical changes. Patient did have history of itching/rash in September of this year.   Medical History.  Past Medical History:  Diagnosis Date  . Cancer ([place]) 03/28/11   SCCA of the Supraglottic Larynx (T2NoMo)    ; S/P Chemoradiation therapy from 05/09/05 thru 06/29/05  . Depression    History of Depression- Lexapro therapy  . Diverticulosis 02/2005   History of diverticulosis with admission from 8/18 - 03/04/2005  . History of chemotherapy    S/P chemotherapy with Dr. Sonny Dandy -2006  . Hypertension   . Hypothyroid   . Osteoporosis    History of Osteoporosis with one year of Actonel only  . Radiation    S/P radiation thrapy -06/2005  . TIA (transient ischemic attack)    History of TIA's with no residual effect    Allergies  Allergen Reactions  . Tuberculin Tests Swelling  . Paroxetine Hcl Rash   Current Outpatient Prescriptions  Medication Sig Dispense Refill  . aspirin EC 81 MG tablet Take 81 mg by mouth at bedtime.    . Calcium Carbonate-Vitamin D (CALCIUM + D PO) Take 1 capsule by mouth 2 (two) times a week.     . doxycycline (VIBRA-TABS) 100 MG tablet Take 1 tablet (100 mg total) by mouth 2 (two) times daily. 1 po bid 20 tablet 0  . escitalopram (LEXAPRO) 10 MG tablet TAKE 1 TABLET BY MOUTH EVERY DAY 30 tablet 0  . ibuprofen (ADVIL,MOTRIN) 200 MG tablet Take 200 mg by mouth every 6 (six) hours as needed for mild pain or moderate pain.    Marland Kitchen levothyroxine (SYNTHROID, LEVOTHROID) 50 MCG tablet TAKE 1 TABLET EVERY DAY 30 tablet 7  . metoprolol succinate (TOPROL-XL) 50 MG 24 hr tablet TAKE 1 TABLET EVERY DAY 30 tablet 4  . polyethylene glycol powder (GLYCOLAX/MIRALAX) powder Take  17 g by mouth 2 (two) times daily as needed for moderate constipation. For constipation (Patient taking differently: Take 17 g by mouth 2 (two) times daily. For constipation) 3350 g 5  . predniSONE (DELTASONE) 20 MG tablet 2 po at same time daily for 5 days 10 tablet 0  . ranitidine (ZANTAC) 150 MG tablet TAKE 1 TABLET EVERY 12 HOURS 60 tablet 3  . tetrahydrozoline-zinc (VISINE-AC) 0.05-0.25 % ophthalmic solution Apply 2 drops to eye 3 (three) times daily as needed (for irritation).    . Vitamin D, Ergocalciferol, (DRISDOL) 50000 units CAPS capsule Take 1 capsule (50,000 Units total) by mouth 2 (two) times a week. 16 capsule 0   No current facility-administered medications for this visit.    CC: Patient without complaints.   Exam: General: Patient is a well-developed, slightly built female in no acute distress. Head and neck: No lymphadenopathy. No acute TMJ symptoms. Intraoral exam: Edentulous . Mild xerostomia.  No denture irritation or erythema noted. 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 Prosthodontics: Upper and lower dentures are stable with less than ideal retention consistent with atrophy of alveolar ridges. Patient had upper and lower complete denture relines inserted on 10/21/2012.  Procedure: Pressure indicting paste applied to dentures and adjustments made as needed. [place] Occlusion adjusted as needed. [place] Right posterior crossbite as before. Patient with tendency to protrude lower jaw with evaluation of occlusion. Patient was again instructed on finding maximum intercuspation position. Less than ideal occlusion, but patient accepts results and is able to chew food without problems.  PLAN: RTC for denture recall in one year. Patient to call is sore spots arise.  Patient to use salt water rinses as needed to aid healing. Patient dismissed in stable condition.   Ruby Cola

## 2016-05-28 NOTE — Patient Instructions (Signed)
PLAN: RTC for denture recall in one year. Patient to call is sore spots arise.  Patient to use salt water rinses as needed to aid healing. Patient dismissed in stable condition.   Ruby Cola

## 2016-06-21 ENCOUNTER — Other Ambulatory Visit: Payer: Self-pay | Admitting: Family Medicine

## 2016-07-04 ENCOUNTER — Other Ambulatory Visit: Payer: Self-pay | Admitting: Family Medicine

## 2016-07-20 ENCOUNTER — Other Ambulatory Visit: Payer: Self-pay | Admitting: Family Medicine

## 2016-08-09 ENCOUNTER — Encounter: Payer: Self-pay | Admitting: Family Medicine

## 2016-08-09 ENCOUNTER — Ambulatory Visit (INDEPENDENT_AMBULATORY_CARE_PROVIDER_SITE_OTHER): Payer: Medicare Other | Admitting: Family Medicine

## 2016-08-09 VITALS — BP 154/79 | HR 83 | Temp 96.7°F | Ht 61.0 in | Wt 138.0 lb

## 2016-08-09 DIAGNOSIS — E559 Vitamin D deficiency, unspecified: Secondary | ICD-10-CM

## 2016-08-09 DIAGNOSIS — E782 Mixed hyperlipidemia: Secondary | ICD-10-CM

## 2016-08-09 DIAGNOSIS — I10 Essential (primary) hypertension: Secondary | ICD-10-CM

## 2016-08-09 DIAGNOSIS — I209 Angina pectoris, unspecified: Secondary | ICD-10-CM

## 2016-08-09 DIAGNOSIS — E039 Hypothyroidism, unspecified: Secondary | ICD-10-CM | POA: Diagnosis not present

## 2016-08-09 NOTE — Progress Notes (Signed)
Subjective:  Patient ID: Crystal Browning, female    DOB: [birth year]  Age: 83 y.o. MRN: [id number]  CC: Hypertension (pt here today for routine follow up on chronic medical condition, and c/o legs getting stiff)   HPI Crystal Browning presents for BP goes up a lot when nervous. Primarily when she comes to the doctor's office. follow-up of hypertension. Patient has no history of headache chest pain or shortness of breath or recent cough. Patient also denies symptoms of TIA such as numbness weakness lateralizing. Patient checks  blood pressure at home and has not had any elevated readings recently. Patient denies side effects from his medication. States taking it regularly. Patient presents for follow-up on  thyroid. The patient has a history of hypothyroidism for many years. It has been stable recently. Pt. denies any change in  voice, loss of hair, heat or cold intolerance. Energy level has been adequate to good. Patient denies constipation and diarrhea. No myxedema. Medication is as noted below. Verified that pt is taking it daily on an empty stomach. Well tolerated.  Patient in for follow-up of elevated cholesterol. Doing well without complaints on current medication. Denies side effects of statin including myalgia and arthralgia and nausea. Also in today for liver function testing. Currently no chest pain, shortness of breath or other cardiovascular related symptoms noted. Patient notes that she does not have chest pain from her angina as long as she takes her beta blocker for prevention. History Crystal Browning has a past medical history of Cancer ([place]) (03/28/11); Depression; Diverticulosis (02/2005); History of chemotherapy; Hypertension; Hypothyroid; Osteoporosis; Radiation; and TIA (transient ischemic attack).   She has a past surgical history that includes Direct laryngoscopy (03/27/2005); Tubal ligation; Total abdominal hysterectomy w/ bilateral salpingoophorectomy (1986); and Cataract extraction, bilateral.    Her family history includes Alcohol abuse in her brother; COPD in her brother; Cancer (age of onset: 9) in her father; Heart disease (age of onset: 2) in her mother; Stroke in her mother.She reports that she has never smoked. She has never used smokeless tobacco. She reports that she does not drink alcohol or use drugs.    ROS Review of Systems  Constitutional: Negative for activity change, appetite change and fever.  HENT: Negative for congestion, rhinorrhea and sore throat.   Eyes: Negative for visual disturbance.  Respiratory: Negative for cough and shortness of breath.   Cardiovascular: Negative for chest pain and palpitations.  Gastrointestinal: Negative for abdominal pain, diarrhea and nausea.  Genitourinary: Negative for dysuria.  Musculoskeletal: Negative for arthralgias and myalgias.    Objective:  BP (!) 154/79   Pulse 83   Temp (!) 96.7 F (35.9 C) (Oral)   Ht '5\' 1"'  (1.549 m)   Wt 138 lb (62.6 kg)   BMI 26.07 kg/m   BP Readings from Last 3 Encounters:  08/09/16 (!) 154/79  05/28/16 (!) 155/70  03/21/16 138/82    Wt Readings from Last 3 Encounters:  08/09/16 138 lb (62.6 kg)  03/21/16 138 lb 9.6 oz (62.9 kg)  01/25/16 137 lb 3.2 oz (62.2 kg)     Physical Exam  Constitutional: She is oriented to person, place, and time. She appears well-developed and well-nourished. No distress.  HENT:  Head: Normocephalic and atraumatic.  Right Ear: External ear normal.  Left Ear: External ear normal.  Nose: Nose normal.  Mouth/Throat: Oropharynx is clear and moist.  Eyes: Conjunctivae and EOM are normal. Pupils are equal, round, and reactive to light.  Neck: Normal range of  motion. Neck supple. No thyromegaly present.  Cardiovascular: Normal rate, regular rhythm and normal heart sounds.   No murmur heard. Pulmonary/Chest: Effort normal and breath sounds normal. No respiratory distress. She has no wheezes. She has no rales.  Abdominal: Soft. Bowel sounds are normal.  She exhibits no distension. There is no tenderness.  Lymphadenopathy:    She has no cervical adenopathy.  Neurological: She is alert and oriented to person, place, and time. She has normal reflexes.  Skin: Skin is warm and dry.  Psychiatric: She has a normal mood and affect. Her behavior is normal. Judgment and thought content normal.    Dg Abd 1 View  Result Date: 09/23/2015 CLINICAL DATA:  83 year old female with history of left lower quadrant abdominal pain since yesterday. No associated nausea, vomiting or diarrhea. EXAM: ABDOMEN - 1 VIEW COMPARISON:  Abdominal radiograph 07/12/2015. FINDINGS: Gas and stool are seen scattered throughout the colon extending to the level of the distal rectum. No pathologic distension of small bowel is noted. No gross evidence of pneumoperitoneum. IMPRESSION: 1. Nonobstructive bowel gas pattern. 2. No pneumoperitoneum. Electronically Signed   By: Vinnie Langton M.D.   On: 09/23/2015 18:11   Ct Renal Stone Study  Result Date: 09/23/2015 CLINICAL DATA:  Left lower quadrant abdominal pain, chronic constipation EXAM: CT ABDOMEN AND PELVIS WITHOUT CONTRAST TECHNIQUE: Multidetector CT imaging of the abdomen and pelvis was performed following the standard protocol without IV contrast. COMPARISON:  CT abdomen pelvis dated 04/19/2010 FINDINGS: Lower chest:  Lung bases are clear. Hepatobiliary: Unenhanced liver is unremarkable. Gallbladder is unremarkable. No intrahepatic or extrahepatic ductal dilatation. Pancreas: Within normal limits. Spleen: Within normal limits. Adrenals/Urinary Tract: Adrenal glands are within normal limits. Kidneys are within normal limits. No renal, ureteral, or bladder calculi.  No hydronephrosis. Bladder is within normal limits. Stomach/Bowel: Stomach is within normal limits. No evidence of bowel obstruction. Sigmoid diverticulosis with associated inflammatory changes in the left lower quadrant (series 2/ image 58), reflecting mild diverticulitis.  No drainable fluid collection/ abscess.  No free air. Vascular/Lymphatic: No evidence of abdominal aortic aneurysm. Atherosclerotic calcifications of the abdominal aorta and branch vessels. No suspicious abdominopelvic lymphadenopathy. Reproductive: Status post hysterectomy. No adnexal masses. Other: No abdominopelvic ascites. Musculoskeletal: Mild degenerative changes of the visualized thoracolumbar spine. IMPRESSION: Mild sigmoid diverticulitis. No drainable fluid collection/abscess. No free air to suggest macroscopic perforation. Electronically Signed   By: Julian Hy M.D.   On: 09/23/2015 21:17    Assessment & Plan:   Callista was seen today for hypertension.  Diagnoses and all orders for this visit:  Essential hypertension -     CBC with Differential/Platelet -     CMP14+EGFR  Angina pectoris (HCC) -     CBC with Differential/Platelet -     CMP14+EGFR  Vitamin D deficiency -     CBC with Differential/Platelet -     CMP14+EGFR -     VITAMIN D 25 Hydroxy (Vit-D Deficiency, Fractures)  Hypothyroidism, unspecified type -     CBC with Differential/Platelet -     CMP14+EGFR -     TSH + free T4  Mixed hyperlipidemia -     Lipid panel    I have discontinued Ms. Tudor's ibuprofen, Vitamin D (Ergocalciferol), predniSONE, and doxycycline. I am also having her maintain her Calcium Carbonate-Vitamin D (CALCIUM + D PO), aspirin EC, tetrahydrozoline-zinc, polyethylene glycol powder, levothyroxine, ranitidine, metoprolol succinate, and escitalopram.  Allergies as of 08/09/2016      Reactions   Tuberculin Tests Swelling  Paroxetine Hcl Rash      Medication List       Accurate as of 08/09/16  2:41 PM. Always use your most recent med list.          aspirin EC 81 MG tablet Take 81 mg by mouth at bedtime.   CALCIUM + D PO Take 1 capsule by mouth 2 (two) times a week.   escitalopram 10 MG tablet Commonly known as:  LEXAPRO TAKE 1 TABLET BY MOUTH EVERY DAY   levothyroxine  50 MCG tablet Commonly known as:  SYNTHROID, LEVOTHROID TAKE 1 TABLET EVERY DAY   metoprolol succinate 50 MG 24 hr tablet Commonly known as:  TOPROL-XL TAKE 1 TABLET EVERY DAY   polyethylene glycol powder powder Commonly known as:  GLYCOLAX/MIRALAX Take 17 g by mouth 2 (two) times daily as needed for moderate constipation. For constipation   ranitidine 150 MG tablet Commonly known as:  ZANTAC TAKE 1 TABLET EVERY 12 HOURS   tetrahydrozoline-zinc 0.05-0.25 % ophthalmic solution Commonly known as:  VISINE-AC Apply 2 drops to eye 3 (three) times daily as needed (for irritation).        Follow-up: No Follow-up on file.  [name], M.D.

## 2016-08-10 LAB — CBC WITH DIFFERENTIAL/PLATELET
BASOS: 0 %
Basophils Absolute: 0 10*3/uL (ref 0.0–0.2)
EOS (ABSOLUTE): 0.2 10*3/uL (ref 0.0–0.4)
Eos: 4 %
HEMOGLOBIN: 12.2 g/dL (ref 11.1–15.9)
Hematocrit: 36.7 % (ref 34.0–46.6)
IMMATURE GRANS (ABS): 0 10*3/uL (ref 0.0–0.1)
Immature Granulocytes: 0 %
LYMPHS: 25 %
Lymphocytes Absolute: 1.4 10*3/uL (ref 0.7–3.1)
MCH: 30.3 pg (ref 26.6–33.0)
MCHC: 33.2 g/dL (ref 31.5–35.7)
MCV: 91 fL (ref 79–97)
Monocytes Absolute: 0.5 10*3/uL (ref 0.1–0.9)
Monocytes: 8 %
NEUTROS ABS: 3.5 10*3/uL (ref 1.4–7.0)
Neutrophils: 63 %
PLATELETS: 246 10*3/uL (ref 150–379)
RBC: 4.03 x10E6/uL (ref 3.77–5.28)
RDW: 14.1 % (ref 12.3–15.4)
WBC: 5.5 10*3/uL (ref 3.4–10.8)

## 2016-08-10 LAB — LIPID PANEL
CHOLESTEROL TOTAL: 191 mg/dL (ref 100–199)
Chol/HDL Ratio: 3.8 ratio units (ref 0.0–4.4)
HDL: 50 mg/dL (ref >39)
LDL CALC: 111 mg/dL — AB (ref 0–99)
TRIGLYCERIDES: 150 mg/dL — AB (ref 0–149)
VLDL Cholesterol Cal: 30 mg/dL (ref 5–40)

## 2016-08-10 LAB — TSH+FREE T4
Free T4: 1.31 ng/dL (ref 0.82–1.77)
TSH: 3.63 u[IU]/mL (ref 0.450–4.500)

## 2016-08-10 LAB — CMP14+EGFR
ALT: 17 IU/L (ref 0–32)
AST: 22 IU/L (ref 0–40)
Albumin/Globulin Ratio: 1.7 (ref 1.2–2.2)
Albumin: 4.2 g/dL (ref 3.5–4.7)
Alkaline Phosphatase: 68 IU/L (ref 39–117)
BILIRUBIN TOTAL: 0.2 mg/dL (ref 0.0–1.2)
BUN / CREAT RATIO: 17 (ref 12–28)
BUN: 13 mg/dL (ref 8–27)
CALCIUM: 9.6 mg/dL (ref 8.7–10.3)
CHLORIDE: 105 mmol/L (ref 96–106)
CO2: 25 mmol/L (ref 18–29)
Creatinine, Ser: 0.75 mg/dL (ref 0.57–1.00)
GFR, EST AFRICAN AMERICAN: 87 mL/min/{1.73_m2} (ref >59)
GFR, EST NON AFRICAN AMERICAN: 76 mL/min/{1.73_m2} (ref >59)
GLOBULIN, TOTAL: 2.5 g/dL (ref 1.5–4.5)
Glucose: 86 mg/dL (ref 65–99)
POTASSIUM: 4.4 mmol/L (ref 3.5–5.2)
Sodium: 143 mmol/L (ref 134–144)
TOTAL PROTEIN: 6.7 g/dL (ref 6.0–8.5)

## 2016-08-10 LAB — VITAMIN D 25 HYDROXY (VIT D DEFICIENCY, FRACTURES): Vit D, 25-Hydroxy: 18.2 ng/mL — ABNORMAL LOW (ref 30.0–100.0)

## 2016-08-12 ENCOUNTER — Other Ambulatory Visit: Payer: Self-pay | Admitting: Family Medicine

## 2016-08-12 MED ORDER — VITAMIN D (ERGOCALCIFEROL) 1.25 MG (50000 UNIT) PO CAPS: 50000.0000 [IU] | ORAL_CAPSULE | ORAL | 0 refills | Status: DC

## 2016-08-13 ENCOUNTER — Telehealth: Payer: Self-pay | Admitting: Family Medicine

## 2016-08-19 ENCOUNTER — Other Ambulatory Visit: Payer: Self-pay | Admitting: Family Medicine

## 2016-08-20 DIAGNOSIS — Z8521 Personal history of malignant neoplasm of larynx: Secondary | ICD-10-CM | POA: Insufficient documentation

## 2016-08-20 DIAGNOSIS — Z923 Personal history of irradiation: Secondary | ICD-10-CM | POA: Diagnosis not present

## 2016-08-20 DIAGNOSIS — R682 Dry mouth, unspecified: Secondary | ICD-10-CM | POA: Diagnosis not present

## 2016-08-20 DIAGNOSIS — K117 Disturbances of salivary secretion: Secondary | ICD-10-CM | POA: Insufficient documentation

## 2016-08-20 HISTORY — DX: Personal history of irradiation: Z92.3

## 2016-09-15 ENCOUNTER — Other Ambulatory Visit: Payer: Self-pay | Admitting: Family Medicine

## 2016-10-10 ENCOUNTER — Encounter: Payer: Self-pay | Admitting: Family Medicine

## 2016-10-10 ENCOUNTER — Ambulatory Visit (INDEPENDENT_AMBULATORY_CARE_PROVIDER_SITE_OTHER): Payer: Medicare Other | Admitting: Family Medicine

## 2016-10-10 VITALS — BP 139/54 | HR 72 | Ht 61.0 in | Wt 139.0 lb

## 2016-10-10 DIAGNOSIS — R002 Palpitations: Secondary | ICD-10-CM | POA: Diagnosis not present

## 2016-10-10 DIAGNOSIS — I209 Angina pectoris, unspecified: Secondary | ICD-10-CM

## 2016-10-10 DIAGNOSIS — I10 Essential (primary) hypertension: Secondary | ICD-10-CM | POA: Diagnosis not present

## 2016-10-10 DIAGNOSIS — I4891 Unspecified atrial fibrillation: Secondary | ICD-10-CM

## 2016-10-10 MED ORDER — METOPROLOL SUCCINATE ER 100 MG PO TB24: 100.0000 mg | ORAL_TABLET | Freq: Every day | ORAL | 3 refills | Status: DC

## 2016-10-10 MED ORDER — RIVAROXABAN (XARELTO) VTE STARTER PACK (15 & 20 MG): ORAL_TABLET | ORAL | 0 refills | Status: DC

## 2016-10-10 NOTE — Progress Notes (Signed)
Subjective:  Patient ID: Crystal Browning, female    DOB: 1934-01-21  Age: 83 y.o. MRN: [id number]  CC: Fatigue (pt here today c/o just feeling tired and not having energy, SOB and tachycardic with exertion.)   HPI Crystal Browning presents for 3 episodes in the last few weeks of dyspnea on exertion. She noted it today walking around [place]. She said her heart started fluttering and she felt short of breath and had a spell. She has had these before and can sit down for about 15 minutes and they'll go away. Today it did not go away and that is why she came in. She does state that her heart tends to flutter for a while when she goes to bed and is trying to sleep at night. She has a history of a small heart attack when her husband died a few years ago. She denies any symptoms referable to stroke including numbness weakness and lateralizing focal signs.   History Sarha has a past medical history of Cancer ([place]) (03/28/11); Depression; Diverticulosis (02/2005); History of chemotherapy; Hypertension; Hypothyroid; Osteoporosis; Radiation; and TIA (transient ischemic attack).   She has a past surgical history that includes Direct laryngoscopy (03/27/2005); Tubal ligation; Total abdominal hysterectomy w/ bilateral salpingoophorectomy (1986); and Cataract extraction, bilateral.   Her family history includes Alcohol abuse in her brother; COPD in her brother; Cancer (age of onset: 50) in her father; Heart disease (age of onset: 25) in her mother; Stroke in her mother.She reports that she has never smoked. She has never used smokeless tobacco. She reports that she does not drink alcohol or use drugs.    ROS Review of Systems  Constitutional: Negative for activity change, appetite change and fever.  HENT: Negative for congestion, rhinorrhea and sore throat.   Eyes: Negative for visual disturbance.  Respiratory: Positive for shortness of breath. Negative for cough.   Cardiovascular: Positive for palpitations.  Negative for chest pain.  Gastrointestinal: Negative for abdominal pain, diarrhea and nausea.  Genitourinary: Negative for dysuria.  Musculoskeletal: Negative for arthralgias and myalgias.    Objective:  BP (!) 139/54   Pulse 72   Ht 5\' 1"  (1.549 m)   Wt 139 lb (63 kg)   BMI 26.26 kg/m   BP Readings from Last 3 Encounters:  10/10/16 (!) 139/54  08/09/16 (!) 154/79  05/28/16 (!) 155/70    Wt Readings from Last 3 Encounters:  10/10/16 139 lb (63 kg)  08/09/16 138 lb (62.6 kg)  03/21/16 138 lb 9.6 oz (62.9 kg)     Physical Exam  Constitutional: She is oriented to person, place, and time. She appears well-developed and well-nourished. No distress.  HENT:  Head: Normocephalic and atraumatic.  Right Ear: External ear normal.  Left Ear: External ear normal.  Nose: Nose normal.  Mouth/Throat: Oropharynx is clear and moist.  Eyes: Conjunctivae and EOM are normal. Pupils are equal, round, and reactive to light.  Neck: Normal range of motion. Neck supple. No thyromegaly present.  Cardiovascular: Normal heart sounds.  An irregularly irregular rhythm present. Tachycardia present.   No murmur heard. Pulmonary/Chest: Effort normal and breath sounds normal. No respiratory distress. She has no wheezes. She has no rales.  Abdominal: Soft. Bowel sounds are normal. She exhibits no distension. There is no tenderness.  Lymphadenopathy:    She has no cervical adenopathy.  Neurological: She is alert and oriented to person, place, and time. She has normal reflexes.  Skin: Skin is warm and dry.  Psychiatric: She  has a normal mood and affect. Her behavior is normal. Judgment and thought content normal.      Assessment & Plan:   Vidhi was seen today for fatigue.  Diagnoses and all orders for this visit:  Atrial fibrillation, unspecified type [facility]) -     Ambulatory referral to Cardiology  Palpitations -     EKG 12-Lead  Essential hypertension  Angina pectoris ([place]) -     EKG  12-Lead  Other orders -     metoprolol succinate (TOPROL-XL) 100 MG 24 hr tablet; Take 1 tablet (100 mg total) by mouth daily. Take with or immediately following a meal. -     Rivaroxaban 15 & 20 MG TBPK; Take as directed on package:  15mg  tablet by mouth twice a day with food. On Day 22, switch to one 20mg  tablet once a day to thin Blood       I have discontinued Ms. Pape's Calcium Carbonate-Vitamin D (CALCIUM + D PO), metoprolol succinate, and Vitamin D (Ergocalciferol). I am also having her start on metoprolol succinate and Rivaroxaban. Additionally, [name] maintain her aspirin EC, tetrahydrozoline-zinc, polyethylene glycol powder, levothyroxine, escitalopram, ranitidine, and B Complex-Folic Acid (B COMPLEX-VITAMIN B12 PO).  Allergies as of 10/10/2016      Reactions   Tuberculin Tests Swelling   Vit D-vit E-safflower Oil    Paroxetine Hcl Rash      Medication List       Accurate as of 10/10/16  5:40 PM. Always use your most recent med list.          aspirin EC 81 MG tablet Take 81 mg by mouth at bedtime.   B COMPLEX-VITAMIN B12 PO Take by mouth.   escitalopram 10 MG tablet Commonly known as:  LEXAPRO TAKE 1 TABLET BY MOUTH EVERY DAY   levothyroxine 50 MCG tablet Commonly known as:  SYNTHROID, LEVOTHROID TAKE 1 TABLET EVERY DAY   metoprolol succinate 100 MG 24 hr tablet Commonly known as:  TOPROL-XL Take 1 tablet (100 mg total) by mouth daily. Take with or immediately following a meal.   polyethylene glycol powder powder Commonly known as:  GLYCOLAX/MIRALAX Take 17 g by mouth 2 (two) times daily as needed for moderate constipation. For constipation   ranitidine 150 MG tablet Commonly known as:  ZANTAC TAKE 1 TABLET EVERY 12 HOURS   Rivaroxaban 15 & 20 MG Tbpk Take as directed on package:  15mg  tablet by mouth twice a day with food. On Day 22, switch to one 20mg  tablet once a day to thin Blood   tetrahydrozoline-zinc 0.05-0.25 % ophthalmic  solution Commonly known as:  VISINE-AC Apply 2 drops to eye 3 (three) times daily as needed (for irritation).        Follow-up: Return in about 2 weeks (around 10/24/2016).  [name], M.D.

## 2016-10-11 ENCOUNTER — Telehealth: Payer: Self-pay | Admitting: Family Medicine

## 2016-10-11 NOTE — Telephone Encounter (Signed)
Family has concerns about patient being on blood thinners she was on Plavix back in 1999 she had a mini stroke. They would like to know if she can not take the Xarelto till she sees the cardiologist 5/16 and maybe increase her Aspirin till then.

## 2016-10-15 NOTE — Telephone Encounter (Signed)
Please contact the patient to let her know that Xarelto isn't entirely different drug from Plavix. He was actually meant to help prevent a stroke. It is much more effective than the Plavix. I highly recommend she start right away.

## 2016-10-15 NOTE — Telephone Encounter (Signed)
Spoke with patient's daughter Gean Birchwood- she states their family does not feel comfortable starting their mother on Xarelto.  Advised her of Dr. Livia Snellen recommendations to start Xarelto immediately.  She would like to know if they can increase Aspirin to 325 mg daily.  Also, she is concerned about waiting until 5/16 for the cardiology appointment.  Advised her to call Rande Lawman to ask if there is a sooner appointment in the [place] office since patient is scheduled to be seen here in [place].  Inez Catalina also reported that her mother seems to have an allergy to Metoprolol and it has gotten worse since increasing to 100 mg.  States she has had an itchy rash on the 50 mg, but since increasing to 100 mg the rash has gotten worse.  She would like to know if they should decrease back to 50 mg.  Odelia Gage of what to look for in an anaphylactic reaction- and told her to go to ER if she develops any of these symptoms. Advised will send to Dr. Livia Snellen to advise.

## 2016-10-17 NOTE — Telephone Encounter (Signed)
Gean Birchwood, patient's daughter ,expressed all her concerns about medications and a continued rash on her mother.   Discussed the benefits of this medication in great detail with Ms. Nelva Bush and gave her Dr. Livia Snellen advise also. Advised to make notes and discuss in detail with cardiology at upcoming appointment.  She will start her mom on xarellto although she fears side effects her mother may have.

## 2016-10-17 NOTE — Telephone Encounter (Signed)
Please contact them. I am not sure what they want me to say. I recommended the medication. I explained the need at the visit. I tried to call multiple times. I finally sent a message back through the pool. Meanwhile, if they don't want to use Xarelto, that is their choice. I can not go along with it though. If they want a substitute, there is not one. If they want me to go along with the decision, I can not. WS

## 2016-10-30 ENCOUNTER — Ambulatory Visit: Payer: Medicare Other | Admitting: Internal Medicine

## 2016-10-30 ENCOUNTER — Ambulatory Visit (INDEPENDENT_AMBULATORY_CARE_PROVIDER_SITE_OTHER): Payer: Medicare Other | Admitting: Internal Medicine

## 2016-10-30 ENCOUNTER — Encounter: Payer: Self-pay | Admitting: Internal Medicine

## 2016-10-30 VITALS — BP 154/88 | HR 78 | Ht 61.0 in | Wt 138.8 lb

## 2016-10-30 DIAGNOSIS — I4891 Unspecified atrial fibrillation: Secondary | ICD-10-CM

## 2016-10-30 DIAGNOSIS — R5383 Other fatigue: Secondary | ICD-10-CM | POA: Diagnosis not present

## 2016-10-30 DIAGNOSIS — Z79899 Other long term (current) drug therapy: Secondary | ICD-10-CM | POA: Diagnosis not present

## 2016-10-30 DIAGNOSIS — I872 Venous insufficiency (chronic) (peripheral): Secondary | ICD-10-CM

## 2016-10-30 DIAGNOSIS — D689 Coagulation defect, unspecified: Secondary | ICD-10-CM

## 2016-10-30 DIAGNOSIS — R600 Localized edema: Secondary | ICD-10-CM | POA: Diagnosis not present

## 2016-10-30 DIAGNOSIS — Z01818 Encounter for other preprocedural examination: Secondary | ICD-10-CM

## 2016-10-30 HISTORY — DX: Unspecified atrial fibrillation: I48.91

## 2016-10-30 HISTORY — DX: Localized edema: R60.0

## 2016-10-30 MED ORDER — RIVAROXABAN 20 MG PO TABS: 20.0000 mg | ORAL_TABLET | Freq: Every day | ORAL | 5 refills | Status: DC

## 2016-10-30 NOTE — Progress Notes (Signed)
OFFICE CONSULT NOTE  Chief Complaint:  New onset atrial fibrillation  Primary Care Physician: [name], MD  HPI:  Crystal Browning is a 83 y.o. female who is being seen today for the evaluation of atrial fibrillation at the request of [name], MD. Crystal Browning was recently seen for symptoms of palpitations and shortness of breath which was mostly noted by her daughter. She said that they routinely go shopping at [facility] and that while walking through the building she became more short of breath easier. It does not seem that Crystal Browning typically complaints about any worsening shortness of breath or symptoms but it was clear to her daughter that she was struggling somewhat to get around. She noted that her pulse was irregular and when she was seen in her primary care doctor's office she was found to be in atrial fibrillation. There is no history of A. fib in the family or personal history although she did have possibly a mild MI after the death of her husband, which sounds like it could've been a stress-induced cardiomyopathic event. She was a ready on metoprolol succinate 50 mg daily and that was increased up to 100 mg daily for rate control and started on Xarelto. Unfortunate she was given the Xarelto DBT dose pack which is 50 mg twice a day and then switching to 20 mg daily. The appropriate dose for her would've been 20 mg daily from the beginning. So far she has not had any bleeding problems. She is also on low-dose aspirin. There is apparently history of diverticulum.  PMHx:  Past Medical History:  Diagnosis Date  . Cancer ([place]) 03/28/11   SCCA of the Supraglottic Larynx (T2NoMo)    ; S/P Chemoradiation therapy from 05/09/05 thru 06/29/05  . Depression    History of Depression- Lexapro therapy  . Diverticulosis 02/2005   History of diverticulosis with admission from 8/18 - 03/04/2005  . History of chemotherapy    S/P chemotherapy with Dr. Sonny Dandy -2006  . Hypertension   . Hypothyroid   .  Osteoporosis    History of Osteoporosis with one year of Actonel only  . Radiation    S/P radiation thrapy -06/2005  . TIA (transient ischemic attack)    History of TIA's with no residual effect    Past Surgical History:  Procedure Laterality Date  . CATARACT EXTRACTION, BILATERAL    . DIRECT LARYNGOSCOPY  03/27/2005   S/P direct laryngoscopy, esophagoscopy and biopsy with Dr. Constance Holster  . TOTAL ABDOMINAL HYSTERECTOMY W/ BILATERAL SALPINGOOPHORECTOMY  1986  . TUBAL LIGATION      FAMHx:  Family History  Problem Relation Age of Onset  . Stroke Mother   . Heart disease Mother 69  . Cancer Father 53    Prostate  . Alcohol abuse Brother   . COPD Brother     SOCHx:   reports that she has never smoked. She has never used smokeless tobacco. She reports that she does not drink alcohol or use drugs.  ALLERGIES:  Allergies  Allergen Reactions  . Tuberculin Tests Swelling  . Vit D-Vit E-Safflower Oil   . Paroxetine Hcl Rash    ROS: Pertinent items noted in HPI and remainder of comprehensive ROS otherwise negative.  HOME MEDS: Current Outpatient Prescriptions on File Prior to Visit  Medication Sig Dispense Refill  . B Complex-Folic Acid (B COMPLEX-VITAMIN B12 PO) Take by mouth.    . escitalopram (LEXAPRO) 10 MG tablet TAKE 1 TABLET BY MOUTH EVERY DAY 30 tablet  2  . levothyroxine (SYNTHROID, LEVOTHROID) 50 MCG tablet TAKE 1 TABLET EVERY DAY 30 tablet 7  . metoprolol succinate (TOPROL-XL) 100 MG 24 hr tablet Take 1 tablet (100 mg total) by mouth daily. Take with or immediately following a meal. 90 tablet 3  . polyethylene glycol powder (GLYCOLAX/MIRALAX) powder Take 17 g by mouth 2 (two) times daily as needed for moderate constipation. For constipation (Patient taking differently: Take 17 g by mouth 2 (two) times daily. For constipation) 3350 g 5  . ranitidine (ZANTAC) 150 MG tablet TAKE 1 TABLET EVERY 12 HOURS 60 tablet 3  . tetrahydrozoline-zinc (VISINE-AC) 0.05-0.25 % ophthalmic  solution Apply 2 drops to eye 3 (three) times daily as needed (for irritation).     No current facility-administered medications on file prior to visit.     LABS/IMAGING: No results found for this or any previous visit (from the past 48 hour(s)). No results found.  WEIGHTS: Wt Readings from Last 3 Encounters:  10/30/16 138 lb 12.8 oz (63 kg)  10/10/16 139 lb (63 kg)  08/09/16 138 lb (62.6 kg)    VITALS: BP (!) 154/88   Pulse 78   Ht 5\' 1"  (1.549 m)   Wt 138 lb 12.8 oz (63 kg)   BMI 26.23 kg/m   EXAM: General appearance: alert and no distress Neck: no carotid bruit and no JVD Lungs: clear to auscultation bilaterally Heart: irregularly irregular rhythm Abdomen: soft, non-tender; bowel sounds normal; no masses,  no organomegaly Extremities: edema Trace to 1+ bilateral lower extremity and varicose veins noted Pulses: 2+ and symmetric Skin: Skin color, texture, turgor normal. No rashes or lesions Neurologic: Grossly normal Psych: Pleasant  EKG: Atrial fibrillation at 78  ASSESSMENT: 1. New onset atrial fibrillation-rate controlled 2. CHADSVASC score of 4 - on Xarelto 3. Hypertension 4. Venous insufficiency  PLAN: 1.   Crystal Browning is new-onset atrial fibrillation which is rate controlled on her current dose of medicine. She was started on Xarelto but was put on the DVT dose with the starter pack. Her daughter reports that she is asked he missed a few of the twice-daily doses therefore will need to restart her Xarelto 20 mg daily for a minimum of 3 weeks additional before we could consider a cardioversion. I did discuss cardioversion today is an option to establish sinus rhythm. I discussed the risks, benefits and alternatives of the procedure and they would be willing to proceed. Her CHADSVASC score is 4 and will need long-term anticoagulation. They prefer once daily dosing for convenience and Xarelto is a good option for that. I advised him to take it with food. She is also  noted to have some lower extremity swelling and for that I would avoid calcium channel blockers. There is evidence of venous insufficiency with spider veins and varicose veins noted on exam. From a swelling standpoint, she benefit from bilateral knee-high 20-30 mmHg compression stockings.  Provided she is compliant with daily Xarelto over the next 3+ weeks we'll schedule cardioversion. Will also obtain an echocardiogram prior to that to rule out any cardiomyopathy.  Thanks for consulting me in the management of her a-fib.  Pixie Casino, MD, [facility] Attending Cardiologist [place] 10/30/2016, 12:59 PM

## 2016-10-30 NOTE — Patient Instructions (Addendum)
Your physician has recommended you make the following change in your medication:  -- START xarelto 20mg  once daily  -- STOP xarelto 10mg  -- STOP aspirin  Your physician has requested that you have an echocardiogram @ [facility]. Echocardiography is a painless test that uses sound waves to create images of your heart. It provides your doctor with information about the size and shape of your heart and how well your heart's chambers and valves are working. This procedure takes approximately one hour. There are no restrictions for this procedure.  Your physician has recommended that you have a Cardioversion (DCCV) with Dr. Debara Pickett AFTER MAY 9. Electrical Cardioversion uses a jolt of electricity to your heart either through paddles or wired patches attached to your chest. This is a controlled, usually prescheduled, procedure. Defibrillation is done under light anesthesia in the hospital, and you usually go home the day of the procedure. This is done to get your heart back into a normal rhythm. You are not awake for the procedure. Please see the instruction sheet given to you today.  You will be required to have the following tests prior to the procedure:  1. Blood work - the blood work can be done no more than 14 days prior to the procedure.  Please have done at [facility] in [place].  2. Chest X-ray - please have done at [facility]  Dr. Debara Pickett recommends compression stockings.   Your physician recommends that you schedule a follow-up appointment 3-4 weeks after procedure.

## 2016-11-06 ENCOUNTER — Ambulatory Visit (HOSPITAL_COMMUNITY)
Admission: RE | Admit: 2016-11-06 | Discharge: 2016-11-06 | Disposition: A | Discharge status: Home / Self Care | Payer: Medicare Other | Source: Ambulatory Visit | Attending: Internal Medicine | Admitting: Internal Medicine

## 2016-11-06 DIAGNOSIS — I082 Rheumatic disorders of both aortic and tricuspid valves: Secondary | ICD-10-CM | POA: Diagnosis not present

## 2016-11-06 DIAGNOSIS — I4891 Unspecified atrial fibrillation: Secondary | ICD-10-CM | POA: Diagnosis not present

## 2016-11-06 LAB — ECHOCARDIOGRAM COMPLETE
AVLVOTPG: 2 mmHg
CHL CUP MV DEC (S): 162
EWDT: 162 ms
FS: 27 % — AB (ref 28–44)
IVS/LV PW RATIO, ED: 1.11
LA ID, A-P, ES: 40 mm
LA vol index: 25.6 mL/m2
LADIAMINDEX: 2.41 cm/m2
LAVOL: 42.6 mL
LAVOLA4C: 32.1 mL
LEFT ATRIUM END SYS DIAM: 40 mm
LV PW d: 9.98 mm — AB (ref 0.6–1.1)
LV SIMPSON'S DISK: 55
LV dias vol: 51 mL (ref 46–106)
LV sys vol index: 14 mL/m2
LV sys vol: 23 mL
LVDIAVOLIN: 31 mL/m2
LVOT SV: 48 mL
LVOT VTI: 15.3 cm
LVOT area: 3.14 cm2
LVOT diameter: 20 mm
LVOT peak vel: 72.5 cm/s
MV Peak grad: 4 mmHg
MV pk E vel: 102 m/s
RV TAPSE: 14.7 mm
RV sys press: 41 mmHg
Reg peak vel: 288 cm/s
Stroke v: 28 ml
TR max vel: 288 cm/s

## 2016-11-06 NOTE — Progress Notes (Signed)
*  PRELIMINARY RESULTS* Echocardiogram 2D Echocardiogram has been performed.  Angeleena, [place] 11/06/2016, 12:06 PM

## 2016-11-07 ENCOUNTER — Other Ambulatory Visit: Payer: Self-pay | Admitting: *Deleted

## 2016-11-07 DIAGNOSIS — I4891 Unspecified atrial fibrillation: Secondary | ICD-10-CM

## 2016-11-09 ENCOUNTER — Telehealth: Payer: Self-pay | Admitting: Internal Medicine

## 2016-11-12 ENCOUNTER — Ambulatory Visit (HOSPITAL_COMMUNITY)
Admission: RE | Admit: 2016-11-12 | Discharge: 2016-11-12 | Disposition: A | Discharge status: Home / Self Care | Payer: Medicare Other | Source: Ambulatory Visit | Attending: Internal Medicine | Admitting: Internal Medicine

## 2016-11-12 ENCOUNTER — Other Ambulatory Visit: Payer: Self-pay | Admitting: Family Medicine

## 2016-11-12 DIAGNOSIS — R918 Other nonspecific abnormal finding of lung field: Secondary | ICD-10-CM | POA: Diagnosis not present

## 2016-11-12 DIAGNOSIS — Z79899 Other long term (current) drug therapy: Secondary | ICD-10-CM | POA: Diagnosis not present

## 2016-11-12 DIAGNOSIS — I10 Essential (primary) hypertension: Secondary | ICD-10-CM | POA: Diagnosis not present

## 2016-11-12 DIAGNOSIS — I700 Atherosclerosis of aorta: Secondary | ICD-10-CM | POA: Insufficient documentation

## 2016-11-12 DIAGNOSIS — D689 Coagulation defect, unspecified: Secondary | ICD-10-CM | POA: Diagnosis not present

## 2016-11-12 DIAGNOSIS — Z01818 Encounter for other preprocedural examination: Secondary | ICD-10-CM | POA: Insufficient documentation

## 2016-11-12 DIAGNOSIS — R5383 Other fatigue: Secondary | ICD-10-CM | POA: Diagnosis not present

## 2016-11-12 LAB — CBC
HCT: 39.4 % (ref 35.0–45.0)
Hemoglobin: 12.8 g/dL (ref 11.7–15.5)
MCH: 30.6 pg (ref 27.0–33.0)
MCHC: 32.5 g/dL (ref 32.0–36.0)
MCV: 94.3 fL (ref 80.0–100.0)
MPV: 10.2 fL (ref 7.5–12.5)
Platelets: 236 10*3/uL (ref 140–400)
RBC: 4.18 MIL/uL (ref 3.80–5.10)
RDW: 14.2 % (ref 11.0–15.0)
WBC: 5.1 10*3/uL (ref 3.8–10.8)

## 2016-11-12 LAB — PROTIME-INR
INR: 1.2 — ABNORMAL HIGH
Prothrombin Time: 12.6 s — ABNORMAL HIGH (ref 9.0–11.5)

## 2016-11-12 LAB — BASIC METABOLIC PANEL
BUN: 9 mg/dL (ref 7–25)
CHLORIDE: 106 mmol/L (ref 98–110)
CO2: 26 mmol/L (ref 20–31)
Calcium: 9.5 mg/dL (ref 8.6–10.4)
Creat: 1 mg/dL — ABNORMAL HIGH (ref 0.60–0.88)
GLUCOSE: 110 mg/dL — AB (ref 65–99)
POTASSIUM: 4.3 mmol/L (ref 3.5–5.3)
Sodium: 141 mmol/L (ref 135–146)

## 2016-11-12 LAB — TSH: TSH: 3.8 mIU/L

## 2016-11-12 LAB — APTT: aPTT: 30 s (ref 22–34)

## 2016-11-12 IMAGING — DX DG CHEST 2V
2 series · 2 of 2 positions shown · non-contrast
Comparison: Chest x-ray of [DATE]

CLINICAL DATA: Preoperative study prior to cardioversion. History
of atrial fibrillation and hypertension as well as head and neck
malignancy.

EXAM:
CHEST  2 VIEW

[chest pa]
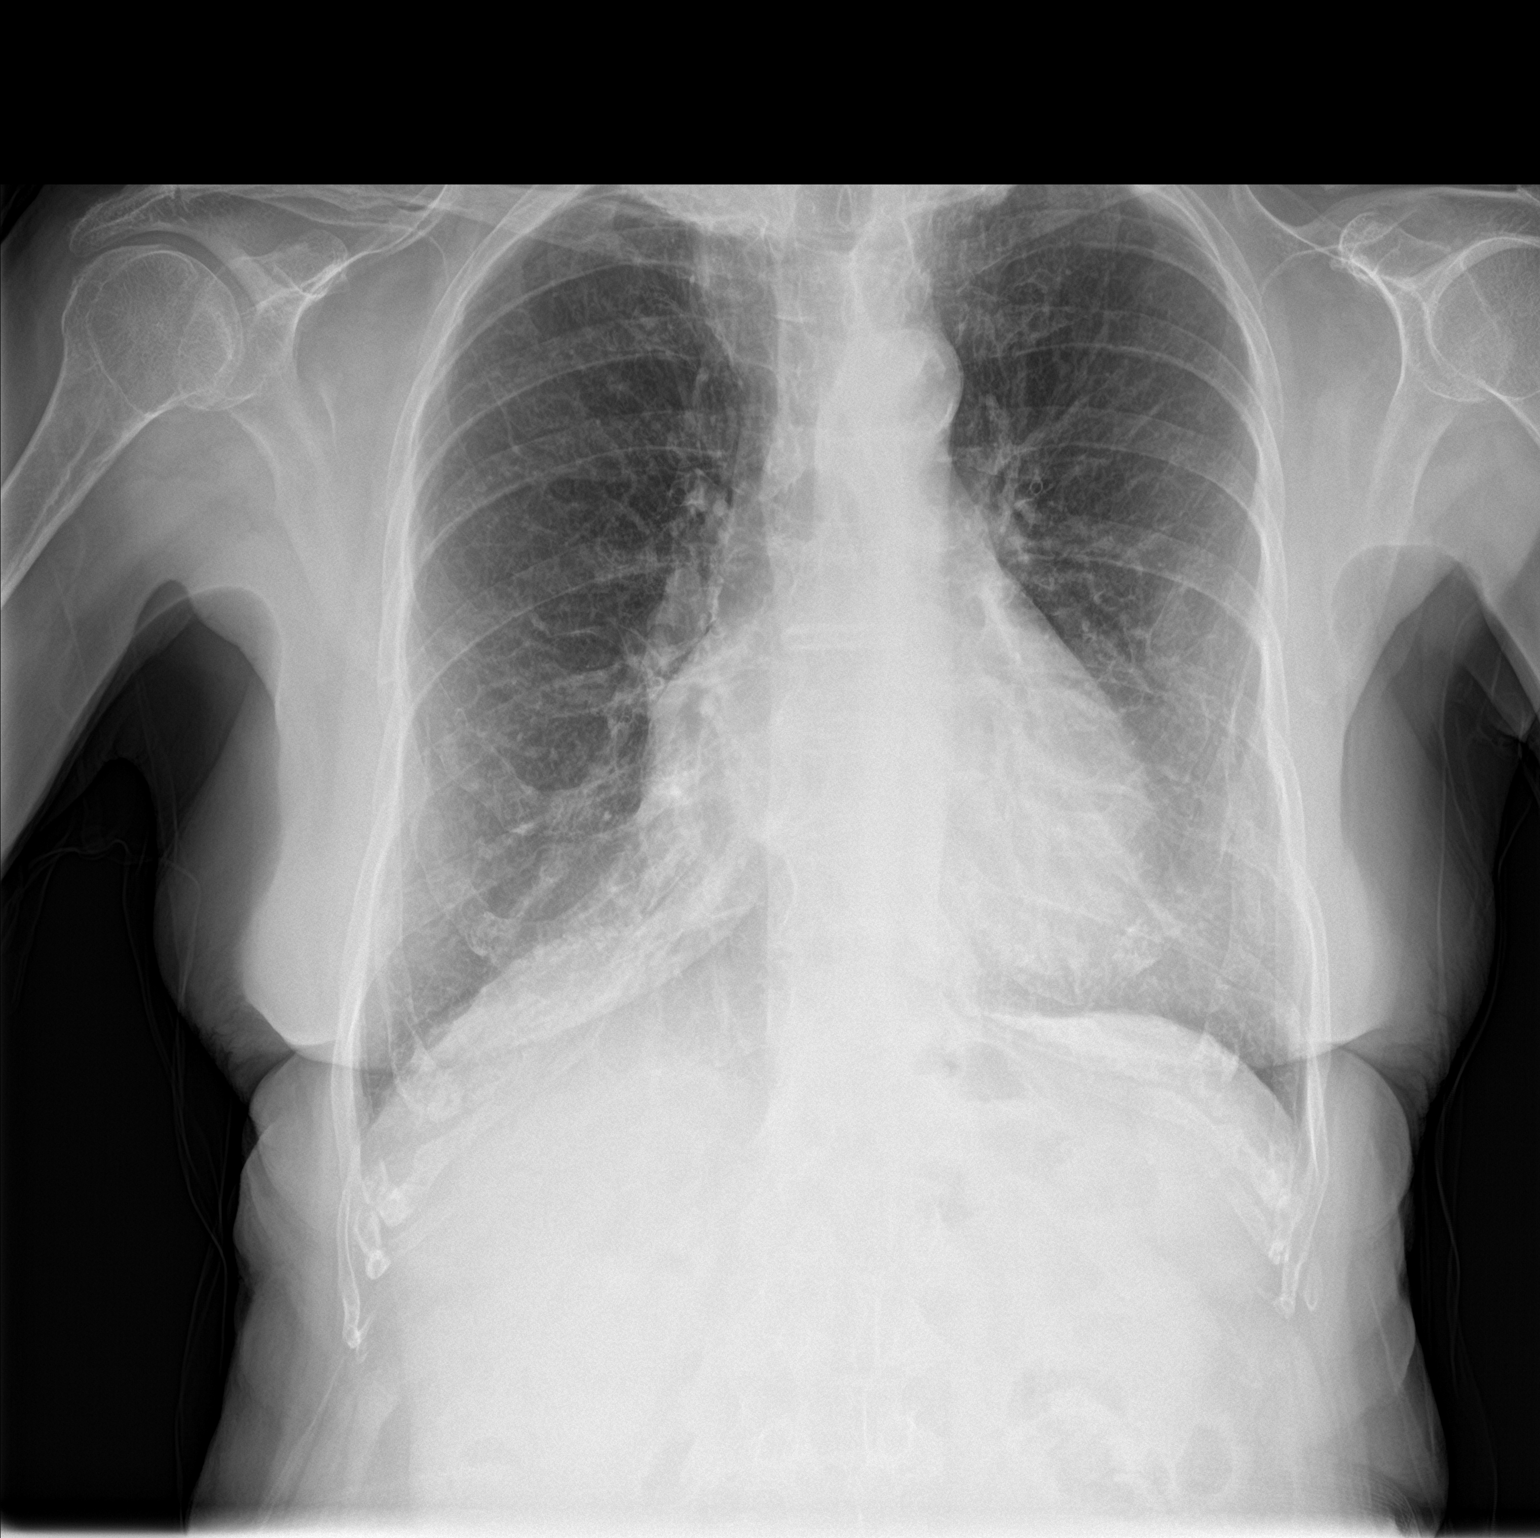

[chest lat]
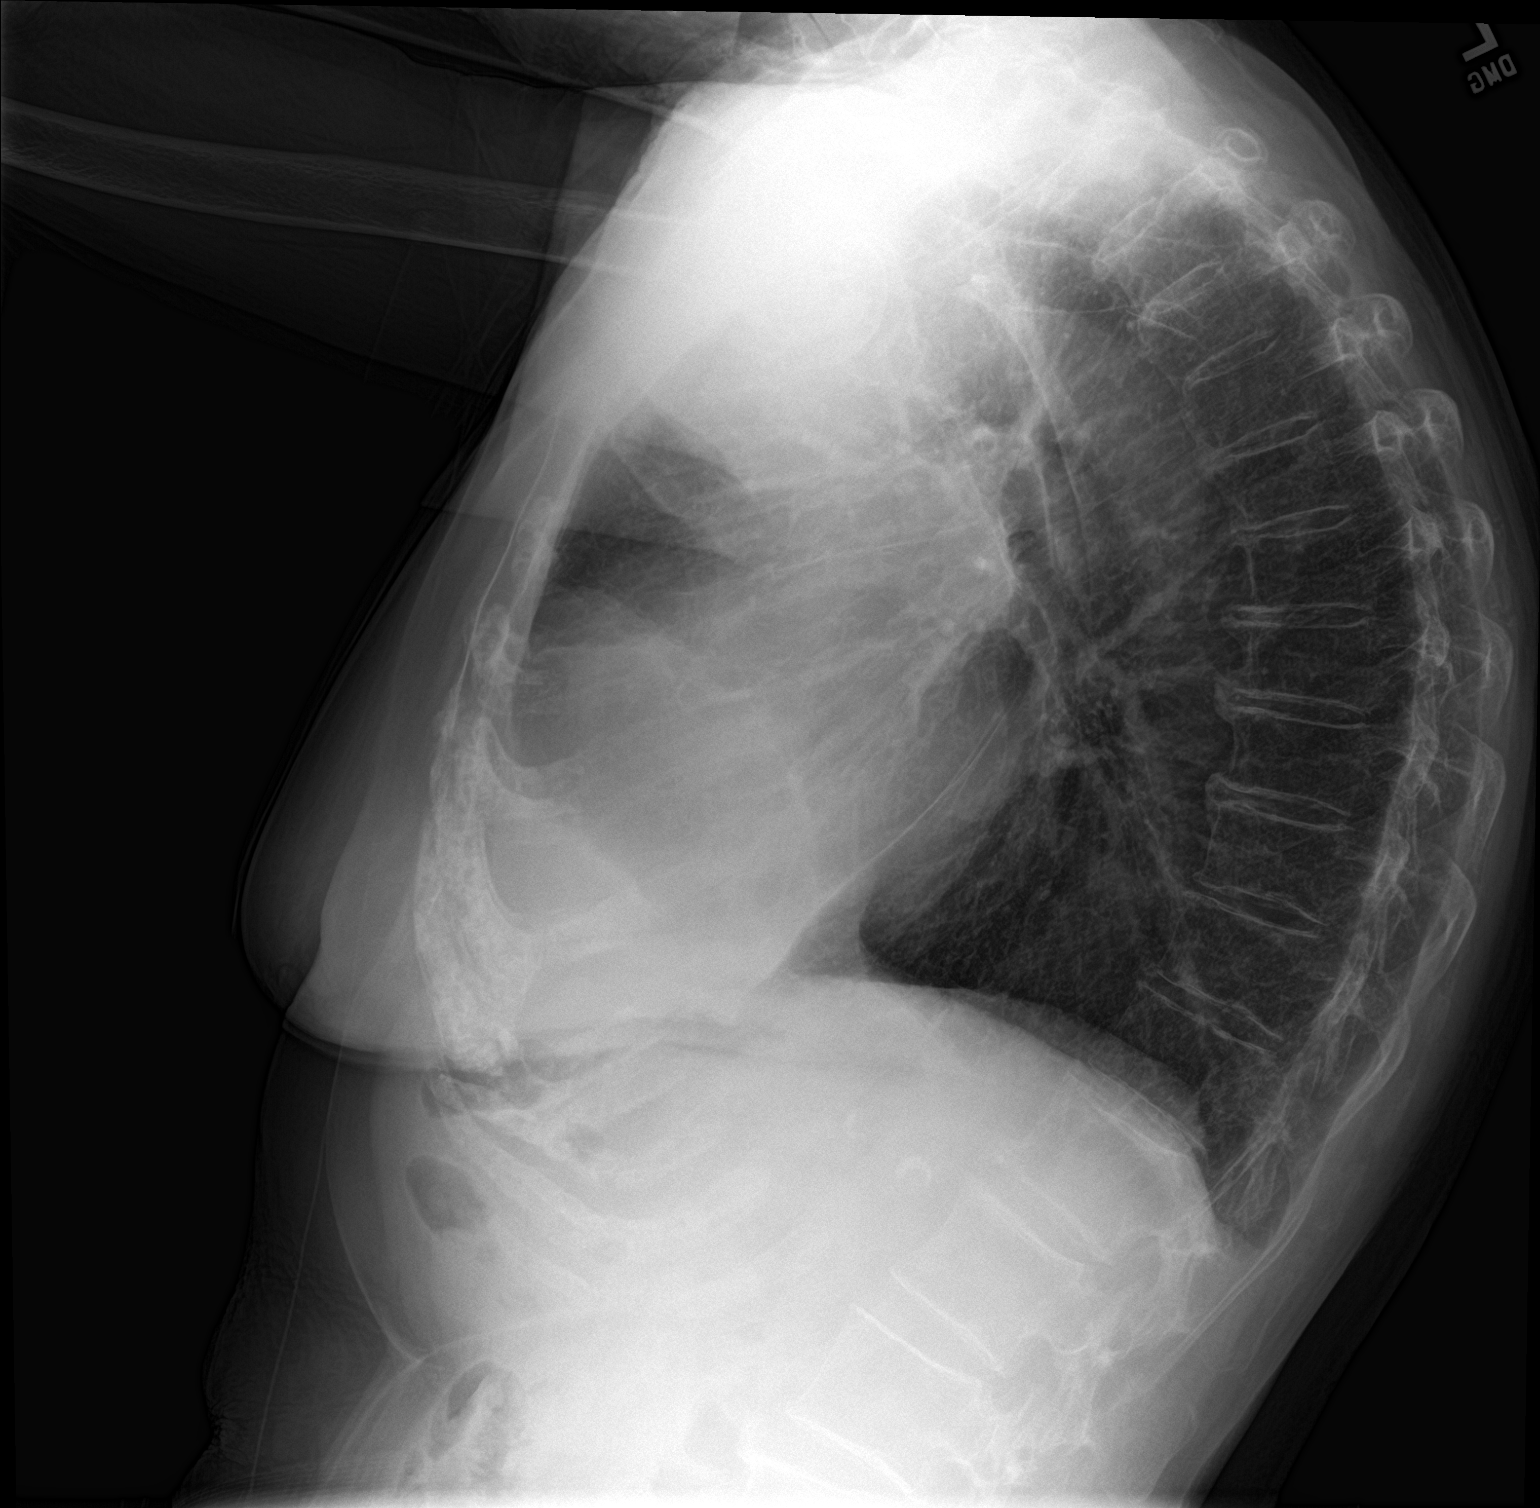

[2 of 2 positions shown; findings below may reference images not displayed]

FINDINGS: The lungs remain hyper inflated. There is no focal infiltrate. There
is no pleural effusion. The heart is top-normal in size. The
pulmonary vascularity is normal. There is calcification in the wall
of the aortic arch. There is stable biapical pleural thickening.
There is mild multilevel degenerative disc disease of the thoracic
spine.
IMPRESSION: Mild chronic bronchitic -reactive airway changes. No acute pneumonia
nor pulmonary edema. Top-normal cardiac size.

Stable biapical pleural thickening.

Thoracic aortic atherosclerosis.

## 2016-11-21 ENCOUNTER — Telehealth: Payer: Self-pay | Admitting: Internal Medicine

## 2016-11-21 NOTE — Telephone Encounter (Signed)
New message   Pt daughter is calling asking if pt takes her medication the morning of her procedure.

## 2016-11-21 NOTE — Telephone Encounter (Signed)
Communicated preprocedure cardioversion instructions to pt/daughter, to which understanding was verbalized.

## 2016-11-23 ENCOUNTER — Encounter (HOSPITAL_COMMUNITY)
Admission: RE | Disposition: A | Discharge status: Home / Self Care | Payer: Self-pay | Source: Ambulatory Visit | Attending: Internal Medicine

## 2016-11-23 ENCOUNTER — Ambulatory Visit (HOSPITAL_COMMUNITY)
Admission: RE | Admit: 2016-11-23 | Discharge: 2016-11-23 | Disposition: A | Discharge status: Home / Self Care | Payer: Medicare Other | Source: Ambulatory Visit | Attending: Internal Medicine | Admitting: Internal Medicine

## 2016-11-23 ENCOUNTER — Ambulatory Visit (HOSPITAL_COMMUNITY): Payer: Medicare Other | Admitting: Anesthesiology

## 2016-11-23 ENCOUNTER — Encounter (HOSPITAL_COMMUNITY): Payer: Self-pay

## 2016-11-23 DIAGNOSIS — E039 Hypothyroidism, unspecified: Secondary | ICD-10-CM | POA: Insufficient documentation

## 2016-11-23 DIAGNOSIS — I209 Angina pectoris, unspecified: Secondary | ICD-10-CM | POA: Diagnosis not present

## 2016-11-23 DIAGNOSIS — I10 Essential (primary) hypertension: Secondary | ICD-10-CM | POA: Diagnosis not present

## 2016-11-23 DIAGNOSIS — I739 Peripheral vascular disease, unspecified: Secondary | ICD-10-CM | POA: Insufficient documentation

## 2016-11-23 DIAGNOSIS — I4891 Unspecified atrial fibrillation: Secondary | ICD-10-CM | POA: Diagnosis not present

## 2016-11-23 HISTORY — PX: CARDIOVERSION: SHX1299

## 2016-11-23 LAB — POCT I-STAT 4, (NA,K, GLUC, HGB,HCT)
Glucose, Bld: 103 mg/dL — ABNORMAL HIGH (ref 65–99)
HCT: 39 % (ref 36.0–46.0)
HEMOGLOBIN: 13.3 g/dL (ref 12.0–15.0)
POTASSIUM: 4.3 mmol/L (ref 3.5–5.1)
SODIUM: 141 mmol/L (ref 135–145)

## 2016-11-23 SURGERY — CARDIOVERSION
Anesthesia: General

## 2016-11-23 MED ORDER — PROPOFOL 10 MG/ML IV BOLUS
INTRAVENOUS | Status: DC | PRN
  Administered 2016-11-23: 30 mg via INTRAVENOUS

## 2016-11-23 MED ORDER — SODIUM CHLORIDE 0.9 % IV SOLN: INTRAVENOUS | Status: DC

## 2016-11-23 NOTE — CV Procedure (Signed)
   CARDIOVERSION NOTE  Procedure: Electrical Cardioversion Indications:  Atrial Fibrillation  Procedure Details:  Consent: Risks of procedure as well as the alternatives and risks of each were explained to the (patient/caregiver).  Consent for procedure obtained.  Time Out: Verified patient identification, verified procedure, site/side was marked, verified correct patient position, special equipment/implants available, medications/allergies/relevent history reviewed, required imaging and test results available.  Performed  Patient placed on cardiac monitor, pulse oximetry, supplemental oxygen as necessary.  Sedation given: Propofol per anesthesia Pacer pads placed anterior and posterior chest.  Cardioverted 1 time(s).  Cardioverted at 150J biphasic.  Impression: Findings: Post procedure EKG shows: NSR Complications: None Patient did tolerate procedure well.  Plan: 1. Successful DCCV to NSR with a single 150J biphasic shock. 2. Follow-up already arranged in the office on 6/1.  Time Spent Directly with the Patient:  30 minutes   Pixie Casino, MD, [place]  Attending Cardiologist  Direct Dial: [phone]  Fax: [phone]  Website:  [email or web address] 11/23/2016, 11:27 AM

## 2016-11-23 NOTE — Discharge Instructions (Signed)
Electrical Cardioversion, Care After °This sheet gives you information about how to care for yourself after your procedure. Your health care provider may also give you more specific instructions. If you have problems or questions, contact your health care provider. °What can I expect after the procedure? °After the procedure, it is common to have: °· Some redness on the skin where the shocks were given. °Follow these instructions at home: °· Do not drive for 24 hours if you were given a medicine to help you relax (sedative). °· Take over-the-counter and prescription medicines only as told by your health care provider. °· Ask your health care provider how to check your pulse. Check it often. °· Rest for 48 hours after the procedure or as told by your health care provider. °· Avoid or limit your caffeine use as told by your health care provider. °Contact a health care provider if: °· You feel like your heart is beating too quickly or your pulse is not regular. °· You have a serious muscle cramp that does not go away. °Get help right away if: °· You have discomfort in your chest. °· You are dizzy or you feel faint. °· You have trouble breathing or you are short of breath. °· Your speech is slurred. °· You have trouble moving an arm or leg on one side of your body. °· Your fingers or toes turn cold or blue. °This information is not intended to replace advice given to you by your health care provider. Make sure you discuss any questions you have with your health care provider. °Document Released: 04/22/2013 Document Revised: 02/03/2016 Document Reviewed: 01/06/2016 °Elsevier Interactive Patient Education © 2017 Elsevier Inc. ° °

## 2016-11-23 NOTE — Transfer of Care (Signed)
Immediate Anesthesia Transfer of Care Note  Patient: Crystal Browning  Procedure(s) Performed: Procedure(s): CARDIOVERSION (N/A)  Patient Location: Endoscopy Unit  Anesthesia Type:General  Level of Consciousness: awake and alert   Airway & Oxygen Therapy: Patient Spontanous Breathing  Post-op Assessment: Report given to RN and Post -op Vital signs reviewed and stable  Post vital signs: Reviewed and stable  Last Vitals:  Vitals:   11/23/16 1014  BP: (!) 187/93  Pulse: 84  Resp: 14  Temp: 36.7 C    Last Pain:  Vitals:   11/23/16 1014  TempSrc: Oral         Complications: No apparent anesthesia complications

## 2016-11-23 NOTE — Anesthesia Postprocedure Evaluation (Addendum)
Anesthesia Post Note  Patient: Crystal Browning  Procedure(s) Performed: Procedure(s) (LRB): CARDIOVERSION (N/A)  Patient location during evaluation: Endoscopy Anesthesia Type: General Level of consciousness: awake and alert Pain management: pain level controlled Vital Signs Assessment: post-procedure vital signs reviewed and stable Respiratory status: spontaneous breathing, nonlabored ventilation, respiratory function stable and patient connected to nasal cannula oxygen Cardiovascular status: blood pressure returned to baseline and stable Postop Assessment: no signs of nausea or vomiting Anesthetic complications: no       Last Vitals:  Vitals:   11/23/16 1135 11/23/16 1145  BP: (!) 157/63 129/84  Pulse: 77 80  Resp: 15 16  Temp:      Last Pain:  Vitals:   11/23/16 1124  TempSrc: Oral                 Robertt Buda

## 2016-11-23 NOTE — H&P (Signed)
   INTERVAL PROCEDURE H&P  History and Physical Interval Note:  11/23/2016 11:04 AM  [name] has presented today for their planned procedure. The various methods of treatment have been discussed with the patient and family. After consideration of risks, benefits and other options for treatment, the patient has consented to the procedure.  The patients' outpatient history has been reviewed, patient examined, and no change in status from most recent office note within the past 30 days. I have reviewed the patients' chart and labs and will proceed as planned. Questions were answered to the patient's satisfaction.   Pixie Casino, MD, [place]  Attending Cardiologist  Direct Dial: [phone]  Fax: [phone]  Website:  [email or web address] 11/23/2016, 11:04 AM

## 2016-11-23 NOTE — Anesthesia Preprocedure Evaluation (Signed)
Anesthesia Evaluation  Patient identified by MRN, date of birth, ID band Patient awake    Reviewed: Allergy & Precautions, NPO status , Patient's Chart, lab work & pertinent test results  Airway Mallampati: II  TM Distance: >3 FB Neck ROM: Full    Dental  (+) Dental Advisory Given   Pulmonary neg shortness of breath, neg sleep apnea, neg COPD, neg recent URI,    breath sounds clear to auscultation       Cardiovascular hypertension, Pt. on medications + angina + Peripheral Vascular Disease  + dysrhythmias Atrial Fibrillation  Rhythm:Irregular     Neuro/Psych PSYCHIATRIC DISORDERS Depression TIA   GI/Hepatic negative GI ROS, Neg liver ROS,   Endo/Other  Hypothyroidism   Renal/GU      Musculoskeletal   Abdominal   Peds  Hematology   Anesthesia Other Findings   Reproductive/Obstetrics                             Anesthesia Physical Anesthesia Plan  ASA: III  Anesthesia Plan: General   Post-op Pain Management:    Induction: Intravenous  Airway Management Planned: Mask  Additional Equipment: None  Intra-op Plan:   Post-operative Plan: Extubation in OR  Informed Consent: I have reviewed the patients History and Physical, chart, labs and discussed the procedure including the risks, benefits and alternatives for the proposed anesthesia with the patient or authorized representative who has indicated his/her understanding and acceptance.   Dental advisory given  Plan Discussed with: CRNA and Surgeon  Anesthesia Plan Comments:         Anesthesia Quick Evaluation

## 2016-11-26 ENCOUNTER — Encounter (HOSPITAL_COMMUNITY): Payer: Self-pay | Admitting: Internal Medicine

## 2016-11-28 ENCOUNTER — Ambulatory Visit: Payer: Self-pay | Admitting: Cardiology

## 2016-12-14 ENCOUNTER — Encounter: Payer: Self-pay | Admitting: Internal Medicine

## 2016-12-14 ENCOUNTER — Ambulatory Visit (INDEPENDENT_AMBULATORY_CARE_PROVIDER_SITE_OTHER): Payer: Medicare Other | Admitting: Internal Medicine

## 2016-12-14 VITALS — BP 120/80 | HR 75 | Ht 61.0 in | Wt 134.4 lb

## 2016-12-14 DIAGNOSIS — Z7901 Long term (current) use of anticoagulants: Secondary | ICD-10-CM

## 2016-12-14 DIAGNOSIS — I4891 Unspecified atrial fibrillation: Secondary | ICD-10-CM | POA: Diagnosis not present

## 2016-12-14 DIAGNOSIS — I10 Essential (primary) hypertension: Secondary | ICD-10-CM | POA: Diagnosis not present

## 2016-12-14 DIAGNOSIS — I209 Angina pectoris, unspecified: Secondary | ICD-10-CM

## 2016-12-14 MED ORDER — APIXABAN 5 MG PO TABS: 5.0000 mg | ORAL_TABLET | Freq: Two times a day (BID) | ORAL | 5 refills | Status: DC

## 2016-12-14 NOTE — Patient Instructions (Addendum)
Your physician has recommended you make the following change in your medication:  -- STOP xarelto -- Eliquis 5mg  twice daily - take 1st dose tonight 6/1  -- 2 boxes of samples + free 30 day card provided to patient   Your physician wants you to follow-up in: 6 months with Dr. Debara Pickett. You will receive a reminder letter in the mail two months in advance. If you don't receive a letter, please call our office to schedule the follow-up appointment.

## 2016-12-14 NOTE — Progress Notes (Signed)
OFFICE CONSULT NOTE  Chief Complaint:  Follow-up cardioversion  Primary Care Physician: [name], MD  HPI:  Crystal Browning is a 83 y.o. female who is being seen today for the evaluation of atrial fibrillation at the request of [name], MD. [name] was recently seen for symptoms of palpitations and shortness of breath which was mostly noted by her daughter. She said that they routinely go shopping at [facility] and that while walking through the building she became more short of breath easier. It does not seem that [name] typically complaints about any worsening shortness of breath or symptoms but it was clear to her daughter that she was struggling somewhat to get around. She noted that her pulse was irregular and when she was seen in her primary care doctor's office she was found to be in atrial fibrillation. There is no history of A. fib in the family or personal history although she did have possibly a mild MI after the death of her husband, which sounds like it could've been a stress-induced cardiomyopathic event. She was a ready on metoprolol succinate 50 mg daily and that was increased up to 100 mg daily for rate control and started on Xarelto. Unfortunate she was given the Xarelto DBT dose pack which is 50 mg twice a day and then switching to 20 mg daily. The appropriate dose for her would've been 20 mg daily from the beginning. So far she has not had any bleeding problems. She is also on low-dose aspirin. There is apparently history of diverticulum.  12/14/2016  [name] returns today for follow-up of her cardioversion. This is performed by myself on 11/23/2016. She was cardioverted once at 150 J successfully to sinus rhythm. Per her daughter's report she had a partial choking event which was related to x-ray therapy in the past 2 days after her cardioversion. EMS was called and she was noted to be in sinus rhythm at the time. She says she's felt very good since then. She  continues to feel well today. Unfortunately, her EKG today shows she is back in atrial fibrillation although rate controlled in the 70s. Based on this finding I think that she is actually somewhat asymptomatic with the A. fib and therefore I would not recommend pursuing a rhythm control strategy in her. We also discussed anticoagulation. Her daughter's concerned about possibly higher bleeding risk on Xarelto and is interested in switching her to Eliquis.  PMHx:  Past Medical History:  Diagnosis Date  . Cancer ([place]) 03/28/11   SCCA of the Supraglottic Larynx (T2NoMo)    ; S/P Chemoradiation therapy from 05/09/05 thru 06/29/05  . Depression    History of Depression- Lexapro therapy  . Diverticulosis 02/2005   History of diverticulosis with admission from 8/18 - 03/04/2005  . History of chemotherapy    S/P chemotherapy with Dr. Sonny Dandy -2006  . Hypertension   . Hypothyroid   . Osteoporosis    History of Osteoporosis with one year of Actonel only  . Radiation    S/P radiation thrapy -06/2005  . TIA (transient ischemic attack)    History of TIA's with no residual effect    Past Surgical History:  Procedure Laterality Date  . CARDIOVERSION N/A 11/23/2016   Procedure: CARDIOVERSION;  Surgeon: Pixie Casino, MD;  Location: [facility] ENDOSCOPY;  Service: Cardiovascular;  Laterality: N/A;  . CATARACT EXTRACTION, BILATERAL    . DIRECT LARYNGOSCOPY  03/27/2005   S/P direct laryngoscopy, esophagoscopy and biopsy with Dr. Constance Holster  .  TOTAL ABDOMINAL HYSTERECTOMY W/ BILATERAL SALPINGOOPHORECTOMY  1986  . TUBAL LIGATION      FAMHx:  Family History  Problem Relation Age of Onset  . Stroke Mother   . Heart disease Mother 56  . Cancer Father 37       Prostate  . Alcohol abuse Brother   . COPD Brother     SOCHx:   reports that she has never smoked. She has never used smokeless tobacco. She reports that she does not drink alcohol or use drugs.  ALLERGIES:  Allergies  Allergen Reactions  . Tuberculin  Tests Swelling  . Vit D-Vit E-Safflower Oil   . Paroxetine Hcl Rash    ROS: Pertinent items noted in HPI and remainder of comprehensive ROS otherwise negative.  HOME MEDS: Current Outpatient Prescriptions on File Prior to Visit  Medication Sig Dispense Refill  . B Complex-Folic Acid (B COMPLEX-VITAMIN B12 PO) Take by mouth.    . escitalopram (LEXAPRO) 10 MG tablet TAKE 1 TABLET BY MOUTH EVERY DAY 30 tablet 2  . levothyroxine (SYNTHROID, LEVOTHROID) 50 MCG tablet TAKE 1 TABLET EVERY DAY 30 tablet 7  . metoprolol succinate (TOPROL-XL) 100 MG 24 hr tablet Take 1 tablet (100 mg total) by mouth daily. Take with or immediately following a meal. 90 tablet 3  . polyethylene glycol powder (GLYCOLAX/MIRALAX) powder Take 17 g by mouth 2 (two) times daily as needed for moderate constipation. For constipation (Patient taking differently: Take 17 g by mouth 2 (two) times daily. For constipation) 3350 g 5  . ranitidine (ZANTAC) 150 MG tablet TAKE 1 TABLET EVERY 12 HOURS 60 tablet 3  . tetrahydrozoline-zinc (VISINE-AC) 0.05-0.25 % ophthalmic solution Apply 2 drops to eye 3 (three) times daily as needed (for irritation).     No current facility-administered medications on file prior to visit.     LABS/IMAGING: No results found for this or any previous visit (from the past 48 hour(s)). No results found.  WEIGHTS: Wt Readings from Last 3 Encounters:  12/14/16 134 lb 6.4 oz (61 kg)  11/23/16 138 lb 12.8 oz (63 kg)  10/30/16 138 lb 12.8 oz (63 kg)    VITALS: BP 120/80   Pulse 75   Ht 5\' 1"  (1.549 m)   Wt 134 lb 6.4 oz (61 kg)   BMI 25.39 kg/m   EXAM: General appearance: alert and no distress Neck: no carotid bruit and no JVD Lungs: clear to auscultation bilaterally Heart: irregularly irregular rhythm Abdomen: soft, non-tender; bowel sounds normal; no masses,  no organomegaly Extremities: extremities normal, atraumatic, no cyanosis or edema Pulses: 2+ and symmetric Skin: Skin color,  texture, turgor normal. No rashes or lesions Neurologic: Grossly normal Psych: Pleasant  EKG: Atrial fibrillation at 75  ASSESSMENT: 1. New onset atrial fibrillation-successful DCCV with recurrence, asymptomatic 2. CHADSVASC score of 4 3. Hypertension 4. Venous insufficiency  PLAN: 1.   Mrs. Choe successfully cardioverted to sinus rhythm however went back into A. fib at some point in the last month. She says she feels great and is felt well since her cardioversion. I think this is more likely due to rate control and anticoagulation. She is back in A. fib and therefore we will not pursue rate control strategy since she is asymptomatic. She should continue on anticoagulation. Her daughter discussed an interest in switching to Eliquis rather than Xarelto for possibly lower bleeding risk. I think that's very reasonable to consider a low cost might be a little higher. Plan to switch to Eliquis 5  mg twice a day. She is borderline for dose reduction with weight of 61 kg and age 65 years old, however creatinine is 1.  Follow-up 6 months.  Pixie Casino, MD, [facility] Attending Cardiologist [place] C Inaki Vantine 12/14/2016, 9:58 AM

## 2016-12-17 NOTE — Addendum Note (Signed)
Addendum  created 12/17/16 1502 by Oleta Mouse, MD   Sign clinical note

## 2017-01-02 ENCOUNTER — Ambulatory Visit (INDEPENDENT_AMBULATORY_CARE_PROVIDER_SITE_OTHER): Payer: Medicare Other | Admitting: Family Medicine

## 2017-01-02 VITALS — BP 145/78 | HR 71 | Temp 97.4°F | Ht 61.0 in | Wt 134.0 lb

## 2017-01-02 DIAGNOSIS — I4891 Unspecified atrial fibrillation: Secondary | ICD-10-CM | POA: Diagnosis not present

## 2017-01-02 DIAGNOSIS — D692 Other nonthrombocytopenic purpura: Secondary | ICD-10-CM

## 2017-01-02 DIAGNOSIS — I209 Angina pectoris, unspecified: Secondary | ICD-10-CM

## 2017-01-02 MED ORDER — TRIAMCINOLONE ACETONIDE 0.1 % EX CREA
1.0000 | TOPICAL_CREAM | Freq: Two times a day (BID) | CUTANEOUS | 0 refills | Status: DC
Start: 2017-01-02 — End: 2017-11-21

## 2017-01-02 NOTE — Progress Notes (Signed)
Subjective:  Patient ID: Crystal Browning, female    DOB: [birth year]  Age: 83 y.o. MRN: [id number]  CC: Arm Pain (pt here today c/o right arm pain since she was seen for her cardioversion with Dr Talmage Coin)   HPI [name] presents for Patient's daughter says actually of the concern is that she's got some bruising on the arm. The pain is minimal. They don't understand why she's having bruising and they are wondering if it is dangerous. She had a cut last night. It bled and bled and bled. They did finally get it stopped and now it has been okay but it's covered with a Band-Aid. Of primary concern is the area on the right upper arm it came up a few days ago without any warning.  Patient in for follow-up of atrial fibrillation. Patient denies any recent bouts of chest pain or palpitations. Additionally, patient is taking anticoagulants. Patient denies any recent excessive bleeding episodes other than above, including epistaxis, bleeding from the gums, genitalia, rectal bleeding or hematuria.  History Muskaan has a past medical history of Cancer ([place]) (03/28/11); Depression; Diverticulosis (02/2005); History of chemotherapy; Hypertension; Hypothyroid; Osteoporosis; Radiation; and TIA (transient ischemic attack).   She has a past surgical history that includes Direct laryngoscopy (03/27/2005); Tubal ligation; Total abdominal hysterectomy w/ bilateral salpingoophorectomy (1986); Cataract extraction, bilateral; and Cardioversion (N/A, 11/23/2016).   Her family history includes Alcohol abuse in her brother; COPD in her brother; Cancer (age of onset: 27) in her father; Heart disease (age of onset: 102) in her mother; Stroke in her mother.She reports that she has never smoked. She has never used smokeless tobacco. She reports that she does not drink alcohol or use drugs.    ROS Review of Systems  Constitutional: Negative for activity change, appetite change and fever.  HENT: Negative for congestion,  rhinorrhea and sore throat.   Eyes: Negative for visual disturbance.  Respiratory: Negative for cough and shortness of breath.   Cardiovascular: Negative for chest pain and palpitations.  Gastrointestinal: Negative for abdominal pain, diarrhea and nausea.  Genitourinary: Negative for dysuria.  Musculoskeletal: Negative for arthralgias and myalgias.    Objective:  BP (!) 145/78   Pulse 71   Temp 97.4 F (36.3 C) (Oral)   Ht 5\' 1"  (1.549 m)   Wt 134 lb (60.8 kg)   BMI 25.32 kg/m   BP Readings from Last 3 Encounters:  01/02/17 (!) 145/78  12/14/16 120/80  11/23/16 129/84    Wt Readings from Last 3 Encounters:  01/02/17 134 lb (60.8 kg)  12/14/16 134 lb 6.4 oz (61 kg)  11/23/16 138 lb 12.8 oz (63 kg)     Physical Exam  Constitutional: She is oriented to person, place, and time. She appears well-developed and well-nourished. No distress.  HENT:  Head: Normocephalic and atraumatic.  Eyes: Conjunctivae are normal. Pupils are equal, round, and reactive to light.  Neck: Normal range of motion. Neck supple. No thyromegaly present.  Cardiovascular: Normal rate, regular rhythm and normal heart sounds.   No murmur heard. Pulmonary/Chest: Effort normal and breath sounds normal. No respiratory distress. She has no wheezes. She has no rales.  Abdominal: Soft. Bowel sounds are normal. She exhibits no distension. There is no tenderness.  Musculoskeletal: Normal range of motion.  Lymphadenopathy:    She has no cervical adenopathy.  Neurological: She is alert and oriented to person, place, and time.  Skin: Skin is warm and dry.  There are just a few purpuric  patches on the forearms measuring from 4 mm to 1 cm. The only larger one is at the right upper arm 3 cm with several nodular excoriations each measuring 2-3 mm on a hematomatous base of faint green  Psychiatric: She has a normal mood and affect. Her behavior is normal. Judgment and thought content normal.      Assessment & Plan:     Yamaris was seen today for arm pain.  Diagnoses and all orders for this visit:  Senile purpura ([place])  Atrial fibrillation, unspecified type ([place])  Other orders -     triamcinolone cream (KENALOG) 0.1 %; Apply 1 application topically 2 (two) times daily.       I am having Ms. Alcocer start on triamcinolone cream. I am also having her maintain her tetrahydrozoline-zinc, polyethylene glycol powder, levothyroxine, ranitidine, B Complex-Folic Acid (B COMPLEX-VITAMIN B12 PO), metoprolol succinate, escitalopram, and apixaban.  Allergies as of 01/02/2017      Reactions   Tuberculin Tests Swelling   Vit D-vit E-safflower Oil    Paroxetine Hcl Rash      Medication List       Accurate as of 01/02/17  3:46 PM. Always use your most recent med list.          apixaban 5 MG Tabs tablet Commonly known as:  ELIQUIS Take 1 tablet (5 mg total) by mouth 2 (two) times daily.   B COMPLEX-VITAMIN B12 PO Take by mouth.   escitalopram 10 MG tablet Commonly known as:  LEXAPRO TAKE 1 TABLET BY MOUTH EVERY DAY   levothyroxine 50 MCG tablet Commonly known as:  SYNTHROID, LEVOTHROID TAKE 1 TABLET EVERY DAY   metoprolol succinate 100 MG 24 hr tablet Commonly known as:  TOPROL-XL Take 1 tablet (100 mg total) by mouth daily. Take with or immediately following a meal.   polyethylene glycol powder powder Commonly known as:  GLYCOLAX/MIRALAX Take 17 g by mouth 2 (two) times daily as needed for moderate constipation. For constipation   ranitidine 150 MG tablet Commonly known as:  ZANTAC TAKE 1 TABLET EVERY 12 HOURS   tetrahydrozoline-zinc 0.05-0.25 % ophthalmic solution Commonly known as:  VISINE-AC Apply 2 drops to eye 3 (three) times daily as needed (for irritation).   triamcinolone cream 0.1 % Commonly known as:  KENALOG Apply 1 application topically 2 (two) times daily.      Patient and her daughter were reassured. Reminded to be very careful with fragile skin and use moisturizers  frequently.  Follow-up: Return in about 2 months (around 03/04/2017).  [name], M.D.

## 2017-01-09 ENCOUNTER — Other Ambulatory Visit: Payer: Self-pay | Admitting: Family Medicine

## 2017-02-08 ENCOUNTER — Other Ambulatory Visit: Payer: Self-pay | Admitting: Family Medicine

## 2017-02-18 ENCOUNTER — Ambulatory Visit: Payer: Medicare Other

## 2017-02-19 ENCOUNTER — Encounter: Payer: Self-pay | Admitting: Family Medicine

## 2017-03-04 ENCOUNTER — Encounter: Payer: Self-pay | Admitting: Family Medicine

## 2017-03-04 ENCOUNTER — Ambulatory Visit (INDEPENDENT_AMBULATORY_CARE_PROVIDER_SITE_OTHER): Payer: Medicare Other | Admitting: Family Medicine

## 2017-03-04 VITALS — BP 156/67 | HR 63 | Temp 97.3°F | Ht 61.0 in | Wt 136.0 lb

## 2017-03-04 DIAGNOSIS — I209 Angina pectoris, unspecified: Secondary | ICD-10-CM

## 2017-03-04 DIAGNOSIS — I10 Essential (primary) hypertension: Secondary | ICD-10-CM

## 2017-03-04 DIAGNOSIS — E039 Hypothyroidism, unspecified: Secondary | ICD-10-CM

## 2017-03-04 DIAGNOSIS — E559 Vitamin D deficiency, unspecified: Secondary | ICD-10-CM

## 2017-03-04 MED ORDER — BLOOD PRESSURE MONITOR KIT: PACK | 0 refills | Status: DC

## 2017-03-04 NOTE — Progress Notes (Signed)
Subjective:  Patient ID: Crystal Browning, female    DOB: 1934/07/14  Age: 83 y.o. MRN: [id number]  CC: Hypertension (pt here today for routine follow up of her chronic medical conditions, no other concerns voiced.)   HPI ERCILIA BETTINGER presents for  follow-up of hypertension. Patient has no history of headache chest pain or shortness of breath or recent cough. Patient also denies symptoms of TIA such as numbness weakness lateralizing. Patient checks  blood pressure at home and has not had any elevated readings recently. Patient denies side effects from medication. States taking it regularly.   Patient in for follow-up of atrial fibrillation. Patient denies any recent bouts of chest pain or palpitations. Additionally, patient is taking anticoagulants. Patient denies any recent excessive bleeding episodes including epistaxis, bleeding from the gums, genitalia, rectal bleeding or hematuria. Additionally there has been no excessive bruising. Patient presents for follow-up on  thyroid. The patient has a history of hypothyroidism for many years. It has been stable recently. Pt. denies any change in  voice, loss of hair, heat or cold intolerance. Energy level has been adequate to good. Patient denies constipation and diarrhea. No myxedema. Medication is as noted below. Verified that pt is taking it daily on an empty stomach. Well tolerated. History Katriona has a past medical history of Cancer ([place]) (03/28/11); Depression; Diverticulosis (02/2005); History of chemotherapy; Hypertension; Hypothyroid; Osteoporosis; Radiation; and TIA (transient ischemic attack).   She has a past surgical history that includes Direct laryngoscopy (03/27/2005); Tubal ligation; Total abdominal hysterectomy w/ bilateral salpingoophorectomy (1986); Cataract extraction, bilateral; and Cardioversion (N/A, 11/23/2016).   Her family history includes Alcohol abuse in her brother; COPD in her brother; Cancer (age of onset: 66) in her father;  Heart disease (age of onset: 66) in her mother; Stroke in her mother.She reports that she has never smoked. She has never used smokeless tobacco. She reports that she does not drink alcohol or use drugs.  Current Outpatient Prescriptions on File Prior to Visit  Medication Sig Dispense Refill  . apixaban (ELIQUIS) 5 MG TABS tablet Take 1 tablet (5 mg total) by mouth 2 (two) times daily. 60 tablet 5  . B Complex-Folic Acid (B COMPLEX-VITAMIN B12 PO) Take by mouth.    . escitalopram (LEXAPRO) 10 MG tablet TAKE 1 TABLET BY MOUTH EVERY DAY 90 tablet 0  . levothyroxine (SYNTHROID, LEVOTHROID) 50 MCG tablet TAKE 1 TABLET EVERY DAY 30 tablet 6  . metoprolol succinate (TOPROL-XL) 100 MG 24 hr tablet Take 1 tablet (100 mg total) by mouth daily. Take with or immediately following a meal. 90 tablet 3  . polyethylene glycol powder (GLYCOLAX/MIRALAX) powder Take 17 g by mouth 2 (two) times daily as needed for moderate constipation. For constipation (Patient taking differently: Take 17 g by mouth 2 (two) times daily. For constipation) 3350 g 5  . ranitidine (ZANTAC) 150 MG tablet TAKE 1 TABLET EVERY 12 HOURS 60 tablet 5  . tetrahydrozoline-zinc (VISINE-AC) 0.05-0.25 % ophthalmic solution Apply 2 drops to eye 3 (three) times daily as needed (for irritation).    . triamcinolone cream (KENALOG) 0.1 % Apply 1 application topically 2 (two) times daily. 30 g 0   No current facility-administered medications on file prior to visit.     ROS Review of Systems  Constitutional: Negative for activity change, appetite change and fever.  HENT: Negative for congestion, rhinorrhea and sore throat.   Eyes: Negative for visual disturbance.  Respiratory: Negative for cough and shortness of breath.  Cardiovascular: Positive for leg swelling (Responds well to compression stockings). Negative for chest pain and palpitations.  Gastrointestinal: Negative for abdominal pain, diarrhea and nausea.  Endocrine: Negative for polyuria.    Genitourinary: Negative for dysuria.  Musculoskeletal: Negative for arthralgias and myalgias.  Hematological: Negative for adenopathy. Does not bruise/bleed easily.    Objective:  BP (!) 156/67   Pulse 63   Temp (!) 97.3 F (36.3 C) (Oral)   Ht _0  (1.549 m)   Wt 136 lb (61.7 kg)   BMI 25.70 kg/m   BP Readings from Last 3 Encounters:  03/04/17 (!) 156/67  01/02/17 (!) 145/78  12/14/16 120/80    Wt Readings from Last 3 Encounters:  03/04/17 136 lb (61.7 kg)  01/02/17 134 lb (60.8 kg)  12/14/16 134 lb 6.4 oz (61 kg)     Physical Exam  Constitutional: She is oriented to person, place, and time. She appears well-developed and well-nourished. No distress.  Elderly, frail, hard of hearing  HENT:  Head: Normocephalic and atraumatic.  Right Ear: External ear normal.  Left Ear: External ear normal.  Nose: Nose normal.  Mouth/Throat: Oropharynx is clear and moist.  Eyes: Pupils are equal, round, and reactive to light. Conjunctivae and EOM are normal.  Neck: Normal range of motion. Neck supple. No thyromegaly present.  Cardiovascular: Normal rate, regular rhythm and normal heart sounds.   No murmur heard. Pulmonary/Chest: Effort normal and breath sounds normal. No respiratory distress. She has no wheezes. She has no rales.  Abdominal: Soft. Bowel sounds are normal. She exhibits no distension. There is no tenderness.  Lymphadenopathy:    She has no cervical adenopathy.  Neurological: She is alert and oriented to person, place, and time. She has normal reflexes.  Skin: Skin is warm and dry.  Psychiatric: She has a normal mood and affect. Her behavior is normal.     Lab Results  Component Value Date   WBC 5.1 11/12/2016   HGB 13.3 11/23/2016   HCT 39.0 11/23/2016   PLT 236 11/12/2016   GLUCOSE 103 (H) 11/23/2016   CHOL 191 08/09/2016   TRIG 150 (H) 08/09/2016   HDL 50 08/09/2016   LDLCALC 111 (H) 08/09/2016   ALT 17 08/09/2016   AST 22 08/09/2016   NA 141  11/23/2016   K 4.3 11/23/2016   CL 106 11/12/2016   CREATININE 1.00 (H) 11/12/2016   BUN 9 11/12/2016   CO2 26 11/12/2016   TSH 3.80 11/12/2016   INR 1.2 (H) 11/12/2016    Dg Chest 2 View  Result Date: 11/12/2016 CLINICAL DATA:  Preoperative study prior to cardioversion. History of atrial fibrillation and hypertension as well as head and neck malignancy. EXAM: CHEST  2 VIEW COMPARISON:  Chest x-ray of June 26, 2014 FINDINGS: The lungs remain hyper inflated. There is no focal infiltrate. There is no pleural effusion. The heart is top-normal in size. The pulmonary vascularity is normal. There is calcification in the wall of the aortic arch. There is stable biapical pleural thickening. There is mild multilevel degenerative disc disease of the thoracic spine. IMPRESSION: Mild chronic bronchitic -reactive airway changes. No acute pneumonia nor pulmonary edema. Top-normal cardiac size. Stable biapical pleural thickening. Thoracic aortic atherosclerosis. Electronically Signed   By: David  Martinique M.D.   On: 11/12/2016 09:53    Assessment & Plan:   Zuria was seen today for hypertension.  Diagnoses and all orders for this visit:  Essential hypertension -     CMP14+EGFR -  CBC with Differential/Platelet  Vitamin D deficiency -     VITAMIN D 25 Hydroxy (Vit-D Deficiency, Fractures)  Hypothyroidism, unspecified type -     TSH -     T4, Free  Other orders -     Blood Pressure Monitor KIT; Use to monitor Blood Pressure at home.   I am having Ms. Wolpert start on Blood Pressure Monitor. I am also having her maintain her tetrahydrozoline-zinc, polyethylene glycol powder, B Complex-Folic Acid (B COMPLEX-VITAMIN B12 PO), metoprolol succinate, apixaban, triamcinolone cream, levothyroxine, ranitidine, and escitalopram.  Meds ordered this encounter  Medications  . Blood Pressure Monitor KIT    Sig: Use to monitor Blood Pressure at home.    Dispense:  1 each    Refill:  0       Follow-up: Return in about 3 months (around 06/04/2017).  [name], M.D.

## 2017-03-05 ENCOUNTER — Telehealth: Payer: Self-pay | Admitting: Family Medicine

## 2017-03-05 LAB — CBC WITH DIFFERENTIAL/PLATELET
Basophils Absolute: 0 10*3/uL (ref 0.0–0.2)
Basos: 0 %
EOS (ABSOLUTE): 0.2 10*3/uL (ref 0.0–0.4)
EOS: 4 %
HEMATOCRIT: 38.1 % (ref 34.0–46.6)
HEMOGLOBIN: 12.2 g/dL (ref 11.1–15.9)
Immature Grans (Abs): 0 10*3/uL (ref 0.0–0.1)
Immature Granulocytes: 0 %
Lymphocytes Absolute: 1.2 10*3/uL (ref 0.7–3.1)
Lymphs: 29 %
MCH: 30.5 pg (ref 26.6–33.0)
MCHC: 32 g/dL (ref 31.5–35.7)
MCV: 95 fL (ref 79–97)
MONOS ABS: 0.4 10*3/uL (ref 0.1–0.9)
Monocytes: 10 %
NEUTROS ABS: 2.5 10*3/uL (ref 1.4–7.0)
Neutrophils: 57 %
Platelets: 222 10*3/uL (ref 150–379)
RBC: 4 x10E6/uL (ref 3.77–5.28)
RDW: 15.6 % — AB (ref 12.3–15.4)
WBC: 4.3 10*3/uL (ref 3.4–10.8)

## 2017-03-05 LAB — CMP14+EGFR
ALBUMIN: 4.4 g/dL (ref 3.5–4.7)
ALK PHOS: 66 IU/L (ref 39–117)
ALT: 20 IU/L (ref 0–32)
AST: 34 IU/L (ref 0–40)
Albumin/Globulin Ratio: 1.6 (ref 1.2–2.2)
BILIRUBIN TOTAL: 0.5 mg/dL (ref 0.0–1.2)
BUN / CREAT RATIO: 11 — AB (ref 12–28)
BUN: 9 mg/dL (ref 8–27)
CHLORIDE: 102 mmol/L (ref 96–106)
CO2: 25 mmol/L (ref 20–29)
CREATININE: 0.8 mg/dL (ref 0.57–1.00)
Calcium: 9.4 mg/dL (ref 8.7–10.3)
GFR calc non Af Amer: 69 mL/min/{1.73_m2} (ref >59)
GFR, EST AFRICAN AMERICAN: 80 mL/min/{1.73_m2} (ref >59)
GLOBULIN, TOTAL: 2.7 g/dL (ref 1.5–4.5)
Glucose: 87 mg/dL (ref 65–99)
Potassium: 4.2 mmol/L (ref 3.5–5.2)
SODIUM: 141 mmol/L (ref 134–144)
TOTAL PROTEIN: 7.1 g/dL (ref 6.0–8.5)

## 2017-03-05 LAB — T4, FREE: Free T4: 1.24 ng/dL (ref 0.82–1.77)

## 2017-03-05 LAB — VITAMIN D 25 HYDROXY (VIT D DEFICIENCY, FRACTURES): Vit D, 25-Hydroxy: 21.5 ng/mL — ABNORMAL LOW (ref 30.0–100.0)

## 2017-03-05 LAB — TSH: TSH: 3.54 u[IU]/mL (ref 0.450–4.500)

## 2017-03-05 NOTE — Telephone Encounter (Signed)
Aware of labs 

## 2017-05-08 ENCOUNTER — Other Ambulatory Visit: Payer: Self-pay | Admitting: Family Medicine

## 2017-06-08 ENCOUNTER — Other Ambulatory Visit: Payer: Self-pay | Admitting: Internal Medicine

## 2017-06-11 ENCOUNTER — Encounter (HOSPITAL_COMMUNITY): Payer: Self-pay | Admitting: Dentistry

## 2017-06-14 ENCOUNTER — Ambulatory Visit (INDEPENDENT_AMBULATORY_CARE_PROVIDER_SITE_OTHER): Payer: Medicare Other | Admitting: Internal Medicine

## 2017-06-14 ENCOUNTER — Encounter: Payer: Self-pay | Admitting: Internal Medicine

## 2017-06-14 VITALS — BP 132/74 | HR 76 | Ht 61.0 in | Wt 142.0 lb

## 2017-06-14 DIAGNOSIS — I4891 Unspecified atrial fibrillation: Secondary | ICD-10-CM

## 2017-06-14 DIAGNOSIS — I10 Essential (primary) hypertension: Secondary | ICD-10-CM

## 2017-06-14 DIAGNOSIS — I209 Angina pectoris, unspecified: Secondary | ICD-10-CM | POA: Diagnosis not present

## 2017-06-14 DIAGNOSIS — I872 Venous insufficiency (chronic) (peripheral): Secondary | ICD-10-CM | POA: Diagnosis not present

## 2017-06-14 NOTE — Progress Notes (Signed)
OFFICE CONSULT NOTE  Chief Complaint:  Follow-up afib  Primary Care Physician: [name], MD  HPI:  Crystal Browning is a 83 y.o. female who is being seen today for the evaluation of atrial fibrillation at the request of [name], MD. Shatana was recently seen for symptoms of palpitations and shortness of breath which was mostly noted by her daughter. She said that they routinely go shopping at [facility] and that while walking through the building she became more short of breath easier. It does not seem that Crystal Browning typically complaints about any worsening shortness of breath or symptoms but it was clear to her daughter that she was struggling somewhat to get around. She noted that her pulse was irregular and when she was seen in her primary care doctor's office she was found to be in atrial fibrillation. There is no history of A. fib in the family or personal history although she did have possibly a mild MI after the death of her husband, which sounds like it could've been a stress-induced cardiomyopathic event. She was a ready on metoprolol succinate 50 mg daily and that was increased up to 100 mg daily for rate control and started on Xarelto. Unfortunate she was given the Xarelto DBT dose pack which is 50 mg twice a day and then switching to 20 mg daily. The appropriate dose for her would've been 20 mg daily from the beginning. So far she has not had any bleeding problems. She is also on low-dose aspirin. There is apparently history of diverticulum.  12/14/2016  Crystal Browning returns today for follow-up of her cardioversion. This is performed by myself on 11/23/2016. She was cardioverted once at 150 J successfully to sinus rhythm. Per her daughter's report she had a partial choking event which was related to x-ray therapy in the past 2 days after her cardioversion. EMS was called and she was noted to be in sinus rhythm at the time. She says she's felt very good since then. She continues to  feel well today. Unfortunately, her EKG today shows she is back in atrial fibrillation although rate controlled in the 70s. Based on this finding I think that she is actually somewhat asymptomatic with the A. fib and therefore I would not recommend pursuing a rhythm control strategy in her. We also discussed anticoagulation. Her daughter's concerned about possibly higher bleeding risk on Xarelto and is interested in switching her to Eliquis.  06/14/2017  Crystal Browning returns today for follow-up.  She is done well without any recurrent A. fib.  She occasionally gets some dizziness but I do not feel that this is the A. fib.  EKG shows sinus rhythm today.  She denies any bleeding problems on Eliquis.  Recently labs were done in August by her primary care provider which showed normal renal function and stable hemoglobin and hematocrit.  Blood pressure is at goal today.  PMHx:  Past Medical History:  Diagnosis Date  . 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 Depression    History of Depression- Lexapro therapy  . Diverticulosis 02/2005   History of diverticulosis with admission from 8/18 - 03/04/2005  . History of chemotherapy    S/P chemotherapy with Dr. Sonny Dandy -2006  . Hypertension   . Hypothyroid   . Osteoporosis    History of Osteoporosis with one year of Actonel only  . Radiation  S/P radiation thrapy -06/2005  . TIA (transient ischemic attack)    History of TIA's with no residual effect    Past Surgical History:  Procedure Laterality Date  . CARDIOVERSION N/A 11/23/2016   Procedure: CARDIOVERSION;  Surgeon: Pixie Casino, MD;  Location: [facility] ENDOSCOPY;  Service: Cardiovascular;  Laterality: N/A;  . CATARACT EXTRACTION, BILATERAL    . DIRECT LARYNGOSCOPY  03/27/2005   S/P direct laryngoscopy, esophagoscopy and biopsy with Dr. Constance Holster  . TOTAL ABDOMINAL HYSTERECTOMY W/ BILATERAL SALPINGOOPHORECTOMY  1986    . TUBAL LIGATION      FAMHx:  Family History  Problem Relation Age of Onset  . Stroke Mother   . Heart disease Mother 76  . Cancer Father 9       Prostate  . Alcohol abuse Brother   . COPD Brother     SOCHx:   reports that  has never smoked. she has never used smokeless tobacco. She reports that she does not drink alcohol or use drugs.  ALLERGIES:  Allergies  Allergen Reactions  . Tuberculin Tests Swelling  . Vit D-Vit E-Safflower Oil   . Paroxetine Hcl Rash    ROS: Pertinent items noted in HPI and remainder of comprehensive ROS otherwise negative.  HOME MEDS: Current Outpatient Medications on File Prior to Visit  Medication Sig Dispense Refill  . B Complex-Folic Acid (B COMPLEX-VITAMIN B12 PO) Take by mouth.    . Blood Pressure Monitor KIT Use to monitor Blood Pressure at home. 1 each 0  . ELIQUIS 5 MG TABS tablet TAKE 1 TABLET BY MOUTH TWICE A DAY 180 tablet 1  . escitalopram (LEXAPRO) 10 MG tablet TAKE 1 TABLET BY MOUTH EVERY DAY 90 tablet 0  . levothyroxine (SYNTHROID, LEVOTHROID) 50 MCG tablet TAKE 1 TABLET EVERY DAY 30 tablet 6  . metoprolol succinate (TOPROL-XL) 100 MG 24 hr tablet Take 1 tablet (100 mg total) by mouth daily. Take with or immediately following a meal. 90 tablet 3  . polyethylene glycol powder (GLYCOLAX/MIRALAX) powder Take 17 g by mouth 2 (two) times daily as needed for moderate constipation. For constipation (Patient taking differently: Take 17 g by mouth 2 (two) times daily. For constipation) 3350 g 5  . ranitidine (ZANTAC) 150 MG tablet TAKE 1 TABLET EVERY 12 HOURS 60 tablet 5  . tetrahydrozoline-zinc (VISINE-AC) 0.05-0.25 % ophthalmic solution Apply 2 drops to eye 3 (three) times daily as needed (for irritation).    . triamcinolone cream (KENALOG) 0.1 % Apply 1 application topically 2 (two) times daily. 30 g 0   No current facility-administered medications on file prior to visit.     LABS/IMAGING: No results found for this or any previous  visit (from the past 48 hour(s)). No results found.  WEIGHTS: Wt Readings from Last 3 Encounters:  06/14/17 142 lb (64.4 kg)  03/04/17 136 lb (61.7 kg)  01/02/17 134 lb (60.8 kg)    VITALS: BP 132/74   Pulse 76   Ht '5\' 1"'  (1.549 m)   Wt 142 lb (64.4 kg)   SpO2 95%   BMI 26.83 kg/m   EXAM: General appearance: alert and no distress Neck: no carotid bruit and no JVD Lungs: clear to auscultation bilaterally Heart: regular rate and rhythm, S1, S2 normal, no murmur, click, rub or gallop Abdomen: soft, non-tender; bowel sounds normal; no masses,  no organomegaly Extremities: extremities normal, atraumatic, no cyanosis or edema and varicose veins noted Pulses: 2+ and symmetric Skin: Skin color, texture, turgor normal. No rashes or  lesions Neurologic: Grossly normal Psych: Pleasant  EKG: Sinus rhythm with first-degree AV block at 69, moderate voltage criteria for LVH-personally reviewed  ASSESSMENT: 1. New onset atrial fibrillation-successful DCCV with recurrence, asymptomatic 2. CHADSVASC score of 4 3. Hypertension 4. Venous insufficiency  PLAN: 1.   Crystal Browning is doing well without any recurrent atrial fibrillation.  She is appropriate anticoagulated on Eliquis for a CHADSVASC score of 4.  Blood pressure is well controlled.  She denies any bleeding problems associated with her Eliquis.  Will plan to continue her current medications without any changes today.  Follow-up annually or sooner as necessary.  Pixie Casino, MD, [facility] [place] Director of the Advanced Lipid Disorders &  Card[facility]ovascular [facility] Attending Cardiologist  Direct Dial: [phone]  Fax: [phone]  Website:  [email or web address] 06/14/2017, 2:02 PM

## 2017-06-14 NOTE — Patient Instructions (Signed)
Your physician wants you to follow-up in: ONE YEAR with Dr. Hilty. You will receive a reminder letter in the mail two months in advance. If you don't receive a letter, please call our office to schedule the follow-up appointment.  

## 2017-06-18 ENCOUNTER — Encounter (HOSPITAL_COMMUNITY): Payer: Self-pay | Admitting: Dentistry

## 2017-06-20 ENCOUNTER — Ambulatory Visit (HOSPITAL_COMMUNITY): Payer: Medicaid - Dental | Admitting: Dentistry

## 2017-06-20 ENCOUNTER — Encounter (HOSPITAL_COMMUNITY): Payer: Self-pay | Admitting: Dentistry

## 2017-06-20 VITALS — BP 142/60 | HR 68 | Temp 98.3°F

## 2017-06-20 DIAGNOSIS — K0820 Unspecified atrophy of edentulous alveolar ridge: Secondary | ICD-10-CM

## 2017-06-20 DIAGNOSIS — M264 Malocclusion, unspecified: Secondary | ICD-10-CM

## 2017-06-20 DIAGNOSIS — R682 Dry mouth, unspecified: Secondary | ICD-10-CM

## 2017-06-20 DIAGNOSIS — Z463 Encounter for fitting and adjustment of dental prosthetic device: Secondary | ICD-10-CM

## 2017-06-20 DIAGNOSIS — Z972 Presence of dental prosthetic device (complete) (partial): Secondary | ICD-10-CM

## 2017-06-20 DIAGNOSIS — K117 Disturbances of salivary secretion: Secondary | ICD-10-CM

## 2017-06-20 DIAGNOSIS — K08109 Complete loss of teeth, unspecified cause, unspecified class: Secondary | ICD-10-CM

## 2017-06-20 NOTE — Progress Notes (Signed)
06/20/17  Patient:            MAELYNN MORONEY Date of Birth:  [birth year] MRN:                [id number]  BP (!) 142/60 (BP Location: Left Arm)   Pulse 68   Temp 98.3 F (36.8 C) (Oral)    Hanna Ra presents for periodic oral exam and evaluation of upper and lower complete dentures. Patient with history of atrial fibrillation and cardioversion by Dr. Debara Pickett earlier this year. Patient recently saw Dr. Debara Pickett on 06/14/2017 with plan to maintain patient on Eliquis therapy with no current plan for future cardioversion attempts.   Patient Active Problem List   Diagnosis Date Noted  . Current use of long term anticoagulation 12/14/2016  . Atrial fibrillation ([place]) 10/30/2016  . Other fatigue 10/30/2016  . Bilateral lower extremity edema 10/30/2016  . Venous insufficiency of both lower extremities 10/30/2016  . Postmenopausal 07/17/2015  . Generalized abdominal pain 07/17/2015  . Thyroid activity decreased 07/17/2015  . Essential hypertension 07/12/2015  . Angina pectoris ([place]) 07/12/2015  . Vitamin D deficiency 07/12/2015    Medical History.  Past Medical History:  Diagnosis Date  . 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 Depression    History of Depression- Lexapro therapy  . Diverticulosis 02/2005   History of diverticulosis with admission from 8/18 - 03/04/2005  . History of chemotherapy    S/P chemotherapy with Dr. Sonny Dandy -2006  . Hypertension   . Hypothyroid   . Osteoporosis    History of Osteoporosis with one year of Actonel only  . Radiation    S/P radiation thrapy -06/2005  . TIA (transient ischemic attack)    History of TIA's with no residual effect    Allergies  Allergen Reactions  . Tuberculin Tests Swelling  . Vit D-Vit E-Safflower Oil   . Paroxetine Hcl Rash   Current Outpatient Medications  Medication Sig Dispense Refill  . B Complex-Folic Acid (B COMPLEX-VITAMIN B12 PO)  Take by mouth.    . Blood Pressure Monitor KIT Use to monitor Blood Pressure at home. 1 each 0  . ELIQUIS 5 MG TABS tablet TAKE 1 TABLET BY MOUTH TWICE A DAY 180 tablet 1  . escitalopram (LEXAPRO) 10 MG tablet TAKE 1 TABLET BY MOUTH EVERY DAY 90 tablet 0  . levothyroxine (SYNTHROID, LEVOTHROID) 50 MCG tablet TAKE 1 TABLET EVERY DAY 30 tablet 6  . metoprolol succinate (TOPROL-XL) 100 MG 24 hr tablet Take 1 tablet (100 mg total) by mouth daily. Take with or immediately following a meal. 90 tablet 3  . polyethylene glycol powder (GLYCOLAX/MIRALAX) powder Take 17 g by mouth 2 (two) times daily as needed for moderate constipation. For constipation (Patient taking differently: Take 17 g by mouth 2 (two) times daily. For constipation) 3350 g 5  . ranitidine (ZANTAC) 150 MG tablet TAKE 1 TABLET EVERY 12 HOURS 60 tablet 5  . tetrahydrozoline-zinc (VISINE-AC) 0.05-0.25 % ophthalmic solution Apply 2 drops to eye 3 (three) times daily as needed (for irritation).    . triamcinolone cream (KENALOG) 0.1 % Apply 1 application topically 2 (two) times daily. 30 g 0   No current facility-administered medications for this visit.    CC: Patient without complaints from dentures.   Exam: General: Patient is a well-developed, slightly built female in no acute distress. Head and neck: No lymphadenopathy. No  acute TMJ symptoms. Intraoral exam: Edentulous . Mild xerostomia.  No denture irritation or erythema noted. 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 Prosthodontics: Upper and lower dentures are stable with less than ideal retention consistent with atrophy of alveolar ridges. Patient had upper and lower complete denture relines inserted on 10/21/2012. Initial dentures were fabricated and inserted in 2007. Procedure: Pressure indicting paste applied to dentures and adjustments made as needed. [place] Occlusion adjusted as needed. [place] Right posterior crossbite as before. Patient with tendency to protrude  lower jaw with evaluation of occlusion. Patient was again instructed on finding maximum intercuspation position. Less than ideal occlusion, but patient accepts results and is able to chew food without problems.  PLAN: 1.  I discussed the risks, benefits, complications of proceeding with fabrication of a new upper and lower complete denture by a general dentist in [place], [place] or a Public librarian in [place], [place].  Patient is a difficult case to fabricate dentures for and ideally would follow-up with a prosthodontist for fabrication of new upper and lower dentures.  We also discussed implant therapy with the prosthodontist as well. Patient and daughter currently do not wish to proceed with referral to a prosthodontist or general dentist in [place], [place] for fabrication of new dentures. They wish to proceed with yearly evaluations or to call as needed. 2.  RTC for denture recall in one year. 3. Patient to call is sore spots arise. Patient to use salt water rinses as needed to aid healing.    Ruby Cola

## 2017-06-20 NOTE — Patient Instructions (Signed)
PLAN: 1.  I discussed the risks, benefits, complications of proceeding with fabrication of a new upper and lower complete denture by a general dentist in [place], [place] or a prosthodontist in [place], [place].  Patient is a difficult case to fabricate dentures for and ideally would follow-up with a prosthodontist for fabrication of new upper and lower dentures.  We also discussed implant therapy with the prosthodontist as well. Patient and daughter currently do not wish to proceed with referral to a prosthodontist or general dentist in [place], [place] for fabrication of new dentures. They wish to proceed with yearly evaluations or to call as needed. 2.  RTC for denture recall in one year. 3. Patient to call is sore spots arise. Patient to use salt water rinses as needed to aid healing.    Xzavien Harada F. Stein Windhorst,DDS          

## 2017-07-06 ENCOUNTER — Other Ambulatory Visit: Payer: Self-pay | Admitting: Family Medicine

## 2017-07-18 DIAGNOSIS — Z923 Personal history of irradiation: Secondary | ICD-10-CM | POA: Diagnosis not present

## 2017-07-18 DIAGNOSIS — Z8521 Personal history of malignant neoplasm of larynx: Secondary | ICD-10-CM | POA: Diagnosis not present

## 2017-07-18 DIAGNOSIS — R682 Dry mouth, unspecified: Secondary | ICD-10-CM | POA: Diagnosis not present

## 2017-08-06 ENCOUNTER — Other Ambulatory Visit: Payer: Self-pay | Admitting: Family Medicine

## 2017-08-07 ENCOUNTER — Other Ambulatory Visit: Payer: Self-pay | Admitting: Family Medicine

## 2017-08-07 NOTE — Telephone Encounter (Signed)
Next OV 09/04/17

## 2017-09-04 ENCOUNTER — Encounter: Payer: Self-pay | Admitting: Family Medicine

## 2017-09-04 ENCOUNTER — Ambulatory Visit (INDEPENDENT_AMBULATORY_CARE_PROVIDER_SITE_OTHER): Payer: Medicare Other | Admitting: Family Medicine

## 2017-09-04 VITALS — BP 194/72 | HR 64 | Temp 97.4°F | Ht 61.0 in | Wt 135.5 lb

## 2017-09-04 DIAGNOSIS — I4891 Unspecified atrial fibrillation: Secondary | ICD-10-CM | POA: Diagnosis not present

## 2017-09-04 DIAGNOSIS — E559 Vitamin D deficiency, unspecified: Secondary | ICD-10-CM | POA: Diagnosis not present

## 2017-09-04 DIAGNOSIS — I10 Essential (primary) hypertension: Secondary | ICD-10-CM

## 2017-09-04 DIAGNOSIS — E039 Hypothyroidism, unspecified: Secondary | ICD-10-CM

## 2017-09-04 DIAGNOSIS — N898 Other specified noninflammatory disorders of vagina: Secondary | ICD-10-CM

## 2017-09-04 DIAGNOSIS — Z7901 Long term (current) use of anticoagulants: Secondary | ICD-10-CM

## 2017-09-04 DIAGNOSIS — R300 Dysuria: Secondary | ICD-10-CM

## 2017-09-04 LAB — URINALYSIS
Bilirubin, UA: NEGATIVE
GLUCOSE, UA: NEGATIVE
Ketones, UA: NEGATIVE
Nitrite, UA: NEGATIVE
PROTEIN UA: NEGATIVE
Specific Gravity, UA: 1.01 (ref 1.005–1.030)
UUROB: 0.2 mg/dL (ref 0.2–1.0)
pH, UA: 6.5 (ref 5.0–7.5)

## 2017-09-04 LAB — WET PREP FOR TRICH, YEAST, CLUE
Clue Cell Exam: NEGATIVE
Trichomonas Exam: NEGATIVE
YEAST EXAM: NEGATIVE

## 2017-09-04 MED ORDER — LEVOFLOXACIN 250 MG PO TABS: 250.0000 mg | ORAL_TABLET | Freq: Every day | ORAL | 0 refills | Status: DC

## 2017-09-04 MED ORDER — CLOTRIMAZOLE-BETAMETHASONE 1-0.05 % EX CREA: 1.0000 "application " | TOPICAL_CREAM | Freq: Two times a day (BID) | CUTANEOUS | 0 refills | Status: DC

## 2017-09-04 MED ORDER — ESCITALOPRAM OXALATE 10 MG PO TABS: 10.0000 mg | ORAL_TABLET | Freq: Every day | ORAL | 0 refills | Status: DC

## 2017-09-04 NOTE — Progress Notes (Signed)
Subjective:  Patient ID: Crystal Browning, female    DOB: September 30, 1933  Age: 84 y.o. MRN: [id number]  CC: Hypertension (pt here today for routine follow up of her chronic medical conditions and also c/o dysuria and itching)   HPI Crystal Browning presents for  follow-up of hypertension. Patient has no history of headache chest pain or shortness of breath or recent cough. Patient also denies symptoms of TIA such as focal numbness or weakness. Patient checks  blood pressure at home. Readings recently have been near normal perhaps in the 409W systolic at most.  Patient has had some upper respiratory symptoms and is using some over-the-counter medicine but denies use of decongestants.. Patient denies side effects from medication. States taking it regularly.  Patient is concerned about some burning with urination and vulvar itching that is been going on for several days.  Patient presents for follow-up on  thyroid. The patient has a history of hypothyroidism for many years. It has been stable recently. Pt. denies any change in  voice, loss of hair, heat or cold intolerance. Energy level has been adequate to good. Patient denies constipation and diarrhea. No myxedema. Medication is as noted below. Verified that pt is taking it daily on an empty stomach. Well tolerated.   Patient in for follow-up of atrial fibrillation. Patient denies any recent bouts of chest pain or palpitations. Additionally, patient is taking anticoagulants. Patient denies any recent excessive bleeding episodes including epistaxis, bleeding from the gums, genitalia, rectal bleeding or hematuria. Additionally there has been no excessive bruising.   History Crystal Browning has a past medical history of Cancer ([place]) (03/28/11), Depression, Diverticulosis (02/2005), History of chemotherapy, Hypertension, Hypothyroid, Osteoporosis, Radiation, and TIA (transient ischemic attack).   She has a past surgical history that includes Direct laryngoscopy  (03/27/2005); Tubal ligation; Total abdominal hysterectomy w/ bilateral salpingoophorectomy (1986); Cataract extraction, bilateral; and Cardioversion (N/A, 11/23/2016).   Her family history includes Alcohol abuse in her brother; COPD in her brother; Cancer (age of onset: 5) in her father; Heart disease (age of onset: 65) in her mother; Stroke in her mother.She reports that  has never smoked. she has never used smokeless tobacco. She reports that she does not drink alcohol or use drugs.  Current Outpatient Medications on File Prior to Visit  Medication Sig Dispense Refill  . B Complex-Folic Acid (B COMPLEX-VITAMIN B12 PO) Take by mouth.    . Blood Pressure Monitor KIT Use to monitor Blood Pressure at home. 1 each 0  . ELIQUIS 5 MG TABS tablet TAKE 1 TABLET BY MOUTH TWICE A DAY 180 tablet 1  . levothyroxine (SYNTHROID, LEVOTHROID) 50 MCG tablet TAKE 1 TABLET EVERY DAY 30 tablet 6  . metoprolol succinate (TOPROL-XL) 100 MG 24 hr tablet Take 1 tablet (100 mg total) by mouth daily. Take with or immediately following a meal. 90 tablet 3  . polyethylene glycol powder (GLYCOLAX/MIRALAX) powder Take 17 g by mouth 2 (two) times daily as needed for moderate constipation. For constipation (Patient taking differently: Take 17 g by mouth 2 (two) times daily. For constipation) 3350 g 5  . ranitidine (ZANTAC) 150 MG tablet TAKE 1 TABLET BY MOUTH EVERY 12 HOURS 60 tablet 0  . triamcinolone cream (KENALOG) 0.1 % Apply 1 application topically 2 (two) times daily. 30 g 0   No current facility-administered medications on file prior to visit.     ROS Review of Systems  Constitutional: Negative for activity change, appetite change and fever.  HENT: Negative for  congestion, rhinorrhea and sore throat.   Eyes: Negative for visual disturbance.  Respiratory: Negative for cough and shortness of breath.   Cardiovascular: Negative for chest pain and palpitations.  Gastrointestinal: Negative for abdominal pain, diarrhea  and nausea.  Genitourinary: Positive for dysuria and vaginal pain (Primarily an itch that she digs at it until it bleeds sometimes). Negative for vaginal bleeding and vaginal discharge.  Musculoskeletal: Negative for arthralgias and myalgias.    Objective:  BP (!) 194/72   Pulse 64   Temp (!) 97.4 F (36.3 C) (Oral)   Ht '5\' 1"'  (1.549 m)   Wt 135 lb 8 oz (61.5 kg)   BMI 25.60 kg/m   BP Readings from Last 3 Encounters:  09/04/17 (!) 194/72  06/20/17 (!) 142/60  06/14/17 132/74    Wt Readings from Last 3 Encounters:  09/04/17 135 lb 8 oz (61.5 kg)  06/14/17 142 lb (64.4 kg)  03/04/17 136 lb (61.7 kg)     Physical Exam  Constitutional: She is oriented to person, place, and time. She appears well-developed and well-nourished. No distress.  HENT:  Head: Normocephalic and atraumatic.  Right Ear: External ear normal.  Left Ear: External ear normal.  Nose: Nose normal.  Mouth/Throat: Oropharynx is clear and moist.  Eyes: Conjunctivae and EOM are normal. Pupils are equal, round, and reactive to light.  Neck: Normal range of motion. Neck supple. No thyromegaly present.  Cardiovascular: Normal rate, regular rhythm and normal heart sounds.  No murmur heard. Pulmonary/Chest: Effort normal and breath sounds normal. No respiratory distress. She has no wheezes. She has no rales.  Abdominal: Soft. Bowel sounds are normal. She exhibits no distension. There is no tenderness.  Lymphadenopathy:    She has no cervical adenopathy.  Neurological: She is alert and oriented to person, place, and time. She has normal reflexes.  Skin: Skin is warm and dry.  Psychiatric: She has a normal mood and affect. Her behavior is normal. Judgment and thought content normal.      Assessment & Plan:   Crystal Browning was seen today for hypertension.  Diagnoses and all orders for this visit:  Hypothyroidism, unspecified type -     TSH -     T4, Free  Atrial fibrillation, unspecified type (HCC)  Dysuria -      Urinalysis -     Urine Culture  Vaginal itching -     WET PREP FOR TRICH, YEAST, CLUE  Current use of long term anticoagulation  Essential hypertension -     CBC with Differential/Platelet -     CMP14+EGFR  Vitamin D deficiency  Other orders -     clotrimazole-betamethasone (LOTRISONE) cream; Apply 1 application topically 2 (two) times daily. -     levofloxacin (LEVAQUIN) 250 MG tablet; Take 1 tablet (250 mg total) by mouth daily. -     escitalopram (LEXAPRO) 10 MG tablet; Take 1 tablet (10 mg total) by mouth daily.   Allergies as of 09/04/2017      Reactions   Tuberculin Tests Swelling   Vit D-vit E-safflower Oil    Paroxetine Hcl Rash      Medication List        Accurate as of 09/04/17  5:52 PM. Always use your most recent med list.          B COMPLEX-VITAMIN B12 PO Take by mouth.   Blood Pressure Monitor Kit Use to monitor Blood Pressure at home.   clotrimazole-betamethasone cream Commonly known as:  LOTRISONE Apply 1  application topically 2 (two) times daily.   ELIQUIS 5 MG Tabs tablet Generic drug:  apixaban TAKE 1 TABLET BY MOUTH TWICE A DAY   escitalopram 10 MG tablet Commonly known as:  LEXAPRO Take 1 tablet (10 mg total) by mouth daily.   levofloxacin 250 MG tablet Commonly known as:  LEVAQUIN Take 1 tablet (250 mg total) by mouth daily.   levothyroxine 50 MCG tablet Commonly known as:  SYNTHROID, LEVOTHROID TAKE 1 TABLET EVERY DAY   metoprolol succinate 100 MG 24 hr tablet Commonly known as:  TOPROL-XL Take 1 tablet (100 mg total) by mouth daily. Take with or immediately following a meal.   polyethylene glycol powder powder Commonly known as:  GLYCOLAX/MIRALAX Take 17 g by mouth 2 (two) times daily as needed for moderate constipation. For constipation   ranitidine 150 MG tablet Commonly known as:  ZANTAC TAKE 1 TABLET BY MOUTH EVERY 12 HOURS   triamcinolone cream 0.1 % Commonly known as:  KENALOG Apply 1 application topically 2  (two) times daily.       Meds ordered this encounter  Medications  . clotrimazole-betamethasone (LOTRISONE) cream    Sig: Apply 1 application topically 2 (two) times daily.    Dispense:  30 g    Refill:  0  . levofloxacin (LEVAQUIN) 250 MG tablet    Sig: Take 1 tablet (250 mg total) by mouth daily.    Dispense:  7 tablet    Refill:  0  . escitalopram (LEXAPRO) 10 MG tablet    Sig: Take 1 tablet (10 mg total) by mouth daily.    Dispense:  90 tablet    Refill:  0   Should the Lotrisone not clear the vaginal itch then she will need a pelvic exam in 2 weeks to a month at the same time we recheck her blood pressure which she assures me will be back to normal. Follow-up: Return in about 1 month (around 10/02/2017).  [name], M.D.

## 2017-09-05 LAB — CBC WITH DIFFERENTIAL/PLATELET
BASOS: 0 %
Basophils Absolute: 0 10*3/uL (ref 0.0–0.2)
EOS (ABSOLUTE): 0.2 10*3/uL (ref 0.0–0.4)
Eos: 5 %
HEMOGLOBIN: 11.9 g/dL (ref 11.1–15.9)
Hematocrit: 35.1 % (ref 34.0–46.6)
IMMATURE GRANS (ABS): 0 10*3/uL (ref 0.0–0.1)
IMMATURE GRANULOCYTES: 0 %
LYMPHS: 27 %
Lymphocytes Absolute: 1.2 10*3/uL (ref 0.7–3.1)
MCH: 31.3 pg (ref 26.6–33.0)
MCHC: 33.9 g/dL (ref 31.5–35.7)
MCV: 92 fL (ref 79–97)
MONOCYTES: 10 %
Monocytes Absolute: 0.4 10*3/uL (ref 0.1–0.9)
NEUTROS PCT: 58 %
Neutrophils Absolute: 2.5 10*3/uL (ref 1.4–7.0)
PLATELETS: 245 10*3/uL (ref 150–379)
RBC: 3.8 x10E6/uL (ref 3.77–5.28)
RDW: 14 % (ref 12.3–15.4)
WBC: 4.4 10*3/uL (ref 3.4–10.8)

## 2017-09-05 LAB — CMP14+EGFR
ALT: 23 IU/L (ref 0–32)
AST: 32 IU/L (ref 0–40)
Albumin/Globulin Ratio: 1.8 (ref 1.2–2.2)
Albumin: 4.5 g/dL (ref 3.5–4.7)
Alkaline Phosphatase: 71 IU/L (ref 39–117)
BUN/Creatinine Ratio: 13 (ref 12–28)
BUN: 10 mg/dL (ref 8–27)
Bilirubin Total: 0.5 mg/dL (ref 0.0–1.2)
CALCIUM: 9.5 mg/dL (ref 8.7–10.3)
CO2: 26 mmol/L (ref 20–29)
Chloride: 103 mmol/L (ref 96–106)
Creatinine, Ser: 0.79 mg/dL (ref 0.57–1.00)
GFR, EST AFRICAN AMERICAN: 81 mL/min/{1.73_m2} (ref >59)
GFR, EST NON AFRICAN AMERICAN: 70 mL/min/{1.73_m2} (ref >59)
GLUCOSE: 95 mg/dL (ref 65–99)
Globulin, Total: 2.5 g/dL (ref 1.5–4.5)
Potassium: 4.3 mmol/L (ref 3.5–5.2)
Sodium: 141 mmol/L (ref 134–144)
TOTAL PROTEIN: 7 g/dL (ref 6.0–8.5)

## 2017-09-05 LAB — URINE CULTURE

## 2017-09-05 LAB — T4, FREE: Free T4: 1.3 ng/dL (ref 0.82–1.77)

## 2017-09-05 LAB — TSH: TSH: 4.5 u[IU]/mL (ref 0.450–4.500)

## 2017-09-07 ENCOUNTER — Other Ambulatory Visit: Payer: Self-pay | Admitting: Family Medicine

## 2017-09-09 ENCOUNTER — Telehealth: Payer: Self-pay | Admitting: Family Medicine

## 2017-09-09 NOTE — Telephone Encounter (Signed)
Patient aware that rx's were sent to [facility] on [facility].

## 2017-09-10 ENCOUNTER — Ambulatory Visit: Payer: Medicare Other | Admitting: *Deleted

## 2017-09-25 ENCOUNTER — Telehealth: Payer: Self-pay | Admitting: Internal Medicine

## 2017-09-25 ENCOUNTER — Encounter: Payer: Self-pay | Admitting: Family Medicine

## 2017-09-25 ENCOUNTER — Ambulatory Visit (INDEPENDENT_AMBULATORY_CARE_PROVIDER_SITE_OTHER): Payer: Medicare Other | Admitting: Family Medicine

## 2017-09-25 VITALS — BP 189/74 | HR 64 | Temp 97.8°F | Ht 61.0 in | Wt 135.0 lb

## 2017-09-25 DIAGNOSIS — E039 Hypothyroidism, unspecified: Secondary | ICD-10-CM

## 2017-09-25 DIAGNOSIS — I10 Essential (primary) hypertension: Secondary | ICD-10-CM

## 2017-09-25 MED ORDER — ESCITALOPRAM OXALATE 10 MG PO TABS: 10.0000 mg | ORAL_TABLET | Freq: Every day | ORAL | 1 refills | Status: DC

## 2017-09-25 MED ORDER — METOPROLOL SUCCINATE ER 100 MG PO TB24: 100.0000 mg | ORAL_TABLET | Freq: Every day | ORAL | 1 refills | Status: DC

## 2017-09-25 MED ORDER — RANITIDINE HCL 150 MG PO TABS: 150.0000 mg | ORAL_TABLET | Freq: Two times a day (BID) | ORAL | 1 refills | Status: DC

## 2017-09-25 MED ORDER — LISINOPRIL 10 MG PO TABS: 10.0000 mg | ORAL_TABLET | Freq: Every day | ORAL | 5 refills | Status: DC

## 2017-09-25 NOTE — Telephone Encounter (Signed)
New Message:     Please call,she needs to discuss some new medicine that her primary doctor gave her.

## 2017-09-25 NOTE — Telephone Encounter (Signed)
Spoke with daughter Annamary Carolin) and she wanted to make sure that it was okay for patient to take the newly prescribed BP medication that was given to patient today by her PCP. Daughter was unable to give the name of the medication but after review of chart, lisinopril 10 mg was added today. Daughter advised that it is okay to take this medication as prescribed, do home BP monitoring, keep scheduled follow up appointment, and that she should contact her PCP if symptoms of increased dizziness or fatigue develops after starting lisinopril. Daughter advised that message would be sent to cardiologist as an [place] and or advice. Daughter verbalized understanding of plan.

## 2017-10-01 ENCOUNTER — Encounter: Payer: Self-pay | Admitting: Family Medicine

## 2017-10-01 NOTE — Progress Notes (Signed)
Subjective:  Patient ID: Crystal Browning, female    DOB: July 14, 1934  Age: 84 y.o. MRN: [id number]  CC: Blood Pressure Check (pt here today to recheck BP and also to discuss her thyroid results and the need for medication)   HPI Crystal Browning presents for  follow-up of hypertension. Patient has no history of headache chest pain or shortness of breath or recent cough. Patient also denies symptoms of TIA such as focal numbness or weakness. Patient checks  blood pressure at home. Readings recently good. Patient denies side effects from medication. States taking it regularly.   History Crystal Browning has a past medical history of Cancer ([place]) (03/28/11), Depression, Diverticulosis (02/2005), History of chemotherapy, Hypertension, Hypothyroid, Osteoporosis, Radiation, and TIA (transient ischemic attack).   She has a past surgical history that includes Direct laryngoscopy (03/27/2005); Tubal ligation; Total abdominal hysterectomy w/ bilateral salpingoophorectomy (1986); Cataract extraction, bilateral; and Cardioversion (N/A, 11/23/2016).   Her family history includes Alcohol abuse in her brother; COPD in her brother; Cancer (age of onset: 24) in her father; Heart disease (age of onset: 2) in her mother; Stroke in her mother.She reports that  has never smoked. she has never used smokeless tobacco. She reports that she does not drink alcohol or use drugs.  Current Outpatient Medications on File Prior to Visit  Medication Sig Dispense Refill  . B Complex-Folic Acid (B COMPLEX-VITAMIN B12 PO) Take by mouth.    . Blood Pressure Monitor KIT Use to monitor Blood Pressure at home. 1 each 0  . clotrimazole-betamethasone (LOTRISONE) cream Apply 1 application topically 2 (two) times daily. 30 g 0  . ELIQUIS 5 MG TABS tablet TAKE 1 TABLET BY MOUTH TWICE A DAY 180 tablet 1  . levofloxacin (LEVAQUIN) 250 MG tablet Take 1 tablet (250 mg total) by mouth daily. 7 tablet 0  . levothyroxine (SYNTHROID, LEVOTHROID) 50 MCG tablet  TAKE 1 TABLET EVERY DAY 30 tablet 6  . polyethylene glycol powder (GLYCOLAX/MIRALAX) powder Take 17 g by mouth 2 (two) times daily as needed for moderate constipation. For constipation (Patient taking differently: Take 17 g by mouth 2 (two) times daily. For constipation) 3350 g 5  . triamcinolone cream (KENALOG) 0.1 % Apply 1 application topically 2 (two) times daily. 30 g 0   No current facility-administered medications on file prior to visit.     ROS Review of Systems  Constitutional: Negative for activity change, appetite change and fever.  HENT: Negative for congestion, rhinorrhea and sore throat.   Eyes: Negative for visual disturbance.  Respiratory: Negative for cough and shortness of breath.   Cardiovascular: Negative for chest pain and palpitations.  Gastrointestinal: Negative for abdominal pain, diarrhea and nausea.  Genitourinary: Negative for dysuria.  Musculoskeletal: Negative for arthralgias and myalgias.    Objective:  BP (!) 189/74   Pulse 64   Temp 97.8 F (36.6 C) (Oral)   Ht '5\' 1"'  (1.549 m)   Wt 135 lb (61.2 kg)   BMI 25.51 kg/m   BP Readings from Last 3 Encounters:  09/25/17 (!) 189/74  09/04/17 (!) 194/72  06/20/17 (!) 142/60    Wt Readings from Last 3 Encounters:  09/25/17 135 lb (61.2 kg)  09/04/17 135 lb 8 oz (61.5 kg)  06/14/17 142 lb (64.4 kg)     Physical Exam  Constitutional: She is oriented to person, place, and time. She appears well-developed and well-nourished. No distress.  HENT:  Head: Normocephalic and atraumatic.  Eyes: Conjunctivae are normal. Pupils are equal,  round, and reactive to light.  Neck: Normal range of motion. Neck supple. No thyromegaly present.  Cardiovascular: Normal rate, regular rhythm and normal heart sounds.  No murmur heard. Pulmonary/Chest: Effort normal and breath sounds normal. No respiratory distress. She has no wheezes. She has no rales.  Abdominal: Soft. There is no tenderness.  Musculoskeletal: Normal  range of motion.  Lymphadenopathy:    She has no cervical adenopathy.  Neurological: She is alert and oriented to person, place, and time.  Skin: Skin is warm and dry.  Psychiatric: She has a normal mood and affect. Her behavior is normal. Thought content normal.      Assessment & Plan:   Crystal Browning was seen today for blood pressure check.  Diagnoses and all orders for this visit:  Essential hypertension  Hypothyroidism, unspecified type  Other orders -     lisinopril (PRINIVIL,ZESTRIL) 10 MG tablet; Take 1 tablet (10 mg total) by mouth daily. -     escitalopram (LEXAPRO) 10 MG tablet; Take 1 tablet (10 mg total) by mouth daily. -     metoprolol succinate (TOPROL-XL) 100 MG 24 hr tablet; Take 1 tablet (100 mg total) by mouth daily. Take with or immediately following a meal. -     ranitidine (ZANTAC) 150 MG tablet; Take 1 tablet (150 mg total) by mouth every 12 (twelve) hours.   Allergies as of 09/25/2017      Reactions   Tuberculin Tests Swelling   Vit D-vit E-safflower Oil    Paroxetine Hcl Rash      Medication List        Accurate as of 09/25/17 11:59 PM. Always use your most recent med list.          B COMPLEX-VITAMIN B12 PO Take by mouth.   Blood Pressure Monitor Kit Use to monitor Blood Pressure at home.   clotrimazole-betamethasone cream Commonly known as:  LOTRISONE Apply 1 application topically 2 (two) times daily.   ELIQUIS 5 MG Tabs tablet Generic drug:  apixaban TAKE 1 TABLET BY MOUTH TWICE A DAY   escitalopram 10 MG tablet Commonly known as:  LEXAPRO Take 1 tablet (10 mg total) by mouth daily.   levofloxacin 250 MG tablet Commonly known as:  LEVAQUIN Take 1 tablet (250 mg total) by mouth daily.   levothyroxine 50 MCG tablet Commonly known as:  SYNTHROID, LEVOTHROID TAKE 1 TABLET EVERY DAY   lisinopril 10 MG tablet Commonly known as:  PRINIVIL,ZESTRIL Take 1 tablet (10 mg total) by mouth daily.   metoprolol succinate 100 MG 24 hr  tablet Commonly known as:  TOPROL-XL Take 1 tablet (100 mg total) by mouth daily. Take with or immediately following a meal.   polyethylene glycol powder powder Commonly known as:  GLYCOLAX/MIRALAX Take 17 g by mouth 2 (two) times daily as needed for moderate constipation. For constipation   ranitidine 150 MG tablet Commonly known as:  ZANTAC Take 1 tablet (150 mg total) by mouth every 12 (twelve) hours.   triamcinolone cream 0.1 % Commonly known as:  KENALOG Apply 1 application topically 2 (two) times daily.       Meds ordered this encounter  Medications  . lisinopril (PRINIVIL,ZESTRIL) 10 MG tablet    Sig: Take 1 tablet (10 mg total) by mouth daily.    Dispense:  30 tablet    Refill:  5  . escitalopram (LEXAPRO) 10 MG tablet    Sig: Take 1 tablet (10 mg total) by mouth daily.    Dispense:  90 tablet    Refill:  1  . metoprolol succinate (TOPROL-XL) 100 MG 24 hr tablet    Sig: Take 1 tablet (100 mg total) by mouth daily. Take with or immediately following a meal.    Dispense:  90 tablet    Refill:  1  . ranitidine (ZANTAC) 150 MG tablet    Sig: Take 1 tablet (150 mg total) by mouth every 12 (twelve) hours.    Dispense:  180 tablet    Refill:  1    Thyroid result reviewed in detail. Discussion included implications of hypothyroidism if not treated.   Follow-up: Return in about 1 month (around 10/26/2017).  [name], M.D.

## 2017-10-30 ENCOUNTER — Encounter: Payer: Self-pay | Admitting: Family Medicine

## 2017-10-30 ENCOUNTER — Ambulatory Visit (INDEPENDENT_AMBULATORY_CARE_PROVIDER_SITE_OTHER): Payer: Medicare Other | Admitting: Family Medicine

## 2017-10-30 VITALS — BP 155/75 | HR 66 | Temp 97.6°F | Ht 61.0 in | Wt 134.5 lb

## 2017-10-30 DIAGNOSIS — E782 Mixed hyperlipidemia: Secondary | ICD-10-CM

## 2017-10-30 DIAGNOSIS — I952 Hypotension due to drugs: Secondary | ICD-10-CM | POA: Diagnosis not present

## 2017-10-30 DIAGNOSIS — I10 Essential (primary) hypertension: Secondary | ICD-10-CM | POA: Diagnosis not present

## 2017-10-30 DIAGNOSIS — E039 Hypothyroidism, unspecified: Secondary | ICD-10-CM | POA: Diagnosis not present

## 2017-10-30 NOTE — Progress Notes (Signed)
Subjective:  Patient ID: Crystal Browning, female    DOB: 01-29-1934  Age: 84 y.o. MRN: [id number]  CC: Blood Pressure Check (pt here today following up for BP after adding Lisinopril but her BP dropped to 77/44 so they stopped giving her the Lisinopril)   HPI Crystal Browning presents for  follow-up of hypertension. Patient has no history of headache chest pain or shortness of breath or recent cough. Patient also denies symptoms of TIA such as focal numbness or weakness.  Patient experienced a drop in blood pressure to 77 over at home shortly after starting the lisinopril.  The family discontinued the lisinopril.  Her daughter is with her today and relates that her cardiologist saw her and said that 150 is a good range for her.  Family says it made her look really pale the patient says it made her feel really weak.  It resolved on its own within a few hours.   History Crystal Browning has a past medical history of Cancer ([place]) (03/28/11), Depression, Diverticulosis (02/2005), History of chemotherapy, Hypertension, Hypothyroid, Osteoporosis, Radiation, and TIA (transient ischemic attack).   She has a past surgical history that includes Direct laryngoscopy (03/27/2005); Tubal ligation; Total abdominal hysterectomy w/ bilateral salpingoophorectomy (1986); Cataract extraction, bilateral; and Cardioversion (N/A, 11/23/2016).   Her family history includes Alcohol abuse in her brother; COPD in her brother; Cancer (age of onset: 63) in her father; Heart disease (age of onset: 28) in her mother; Stroke in her mother.She reports that she has never smoked. She has never used smokeless tobacco. She reports that she does not drink alcohol or use drugs.  Current Outpatient Medications on File Prior to Visit  Medication Sig Dispense Refill  . B Complex-Folic Acid (B COMPLEX-VITAMIN B12 PO) Take by mouth.    . Blood Pressure Monitor KIT Use to monitor Blood Pressure at home. 1 each 0  . clotrimazole-betamethasone (LOTRISONE)  cream Apply 1 application topically 2 (two) times daily. 30 g 0  . ELIQUIS 5 MG TABS tablet TAKE 1 TABLET BY MOUTH TWICE A DAY 180 tablet 1  . escitalopram (LEXAPRO) 10 MG tablet Take 1 tablet (10 mg total) by mouth daily. 90 tablet 1  . levothyroxine (SYNTHROID, LEVOTHROID) 50 MCG tablet TAKE 1 TABLET EVERY DAY 30 tablet 6  . metoprolol succinate (TOPROL-XL) 100 MG 24 hr tablet Take 1 tablet (100 mg total) by mouth daily. Take with or immediately following a meal. 90 tablet 1  . polyethylene glycol powder (GLYCOLAX/MIRALAX) powder Take 17 g by mouth 2 (two) times daily as needed for moderate constipation. For constipation (Patient taking differently: Take 17 g by mouth 2 (two) times daily. For constipation) 3350 g 5  . ranitidine (ZANTAC) 150 MG tablet Take 1 tablet (150 mg total) by mouth every 12 (twelve) hours. 180 tablet 1  . triamcinolone cream (KENALOG) 0.1 % Apply 1 application topically 2 (two) times daily. 30 g 0   No current facility-administered medications on file prior to visit.     ROS Review of Systems  Constitutional: Negative.   HENT: Negative for congestion.   Eyes: Negative for visual disturbance.  Respiratory: Negative for shortness of breath.   Cardiovascular: Negative for chest pain.  Gastrointestinal: Negative for abdominal pain, constipation, diarrhea, nausea and vomiting.  Genitourinary: Negative for difficulty urinating.  Musculoskeletal: Negative for arthralgias and myalgias.  Neurological: Negative for headaches.  Psychiatric/Behavioral: Negative for sleep disturbance.    Objective:  BP (!) 155/75   Pulse 66  Temp 97.6 F (36.4 C) (Oral)   Ht _0  (1.549 m)   Wt 134 lb 8 oz (61 kg)   BMI 25.41 kg/m   BP Readings from Last 3 Encounters:  10/30/17 (!) 155/75  09/25/17 (!) 189/74  09/04/17 (!) 194/72    Wt Readings from Last 3 Encounters:  10/30/17 134 lb 8 oz (61 kg)  09/25/17 135 lb (61.2 kg)  09/04/17 135 lb 8 oz (61.5 kg)     Physical  Exam  Constitutional: She is oriented to person, place, and time. She appears well-developed and well-nourished. No distress.  HENT:  Head: Normocephalic and atraumatic.  Right Ear: External ear normal.  Left Ear: External ear normal.  Nose: Nose normal.  Mouth/Throat: Oropharynx is clear and moist. No oropharyngeal exudate.  Eyes: Pupils are equal, round, and reactive to light. Conjunctivae and EOM are normal. Right eye exhibits no discharge. Left eye exhibits no discharge. No scleral icterus.  Neck: Normal range of motion. Neck supple. No JVD present. No thyromegaly present.  Cardiovascular: Normal rate, regular rhythm, normal heart sounds and intact distal pulses. Exam reveals no gallop and no friction rub.  No murmur heard. Pulmonary/Chest: Effort normal and breath sounds normal. No stridor. No respiratory distress. She has no wheezes. She has no rales. She exhibits no tenderness.  Abdominal: Soft. Bowel sounds are normal. There is no tenderness.  Musculoskeletal: Normal range of motion.  Lymphadenopathy:    She has no cervical adenopathy.  Neurological: She is alert and oriented to person, place, and time. She displays normal reflexes. No cranial nerve deficit. She exhibits normal muscle tone. Coordination normal.  Skin: Skin is warm and dry. She is not diaphoretic.  Psychiatric: She has a normal mood and affect. Her behavior is normal.  Vitals reviewed.     Assessment & Plan:   Crystal Browning was seen today for blood pressure check.  Diagnoses and all orders for this visit:  Essential hypertension  Hypotension due to drugs  Hypothyroidism, unspecified type -     TSH -     T4, Free  Mixed hyperlipidemia -     Lipid panel   Allergies as of 10/30/2017      Reactions   Tuberculin Tests Swelling   Vit D-vit E-safflower Oil    Paroxetine Hcl Rash      Medication List        Accurate as of 10/30/17 11:48 AM. Always use your most recent med list.          B  COMPLEX-VITAMIN B12 PO Take by mouth.   Blood Pressure Monitor Kit Use to monitor Blood Pressure at home.   clotrimazole-betamethasone cream Commonly known as:  LOTRISONE Apply 1 application topically 2 (two) times daily.   ELIQUIS 5 MG Tabs tablet Generic drug:  apixaban TAKE 1 TABLET BY MOUTH TWICE A DAY   escitalopram 10 MG tablet Commonly known as:  LEXAPRO Take 1 tablet (10 mg total) by mouth daily.   levothyroxine 50 MCG tablet Commonly known as:  SYNTHROID, LEVOTHROID TAKE 1 TABLET EVERY DAY   metoprolol succinate 100 MG 24 hr tablet Commonly known as:  TOPROL-XL Take 1 tablet (100 mg total) by mouth daily. Take with or immediately following a meal.   polyethylene glycol powder powder Commonly known as:  GLYCOLAX/MIRALAX Take 17 g by mouth 2 (two) times daily as needed for moderate constipation. For constipation   ranitidine 150 MG tablet Commonly known as:  ZANTAC Take 1 tablet (150 mg total)  by mouth every 12 (twelve) hours.   triamcinolone cream 0.1 % Commonly known as:  KENALOG Apply 1 application topically 2 (two) times daily.       No orders of the defined types were placed in this encounter.  Follow-up: Return in about 3 months (around 01/29/2018).  [name], M.D.

## 2017-10-31 LAB — LIPID PANEL
Chol/HDL Ratio: 3.6 ratio (ref 0.0–4.4)
Cholesterol, Total: 204 mg/dL — ABNORMAL HIGH (ref 100–199)
HDL: 56 mg/dL (ref >39)
LDL Calculated: 117 mg/dL — ABNORMAL HIGH (ref 0–99)
TRIGLYCERIDES: 156 mg/dL — AB (ref 0–149)
VLDL CHOLESTEROL CAL: 31 mg/dL (ref 5–40)

## 2017-10-31 LAB — TSH: TSH: 3.48 u[IU]/mL (ref 0.450–4.500)

## 2017-10-31 LAB — T4, FREE: Free T4: 1.32 ng/dL (ref 0.82–1.77)

## 2017-11-21 ENCOUNTER — Other Ambulatory Visit: Payer: Self-pay | Admitting: Family Medicine

## 2017-12-04 ENCOUNTER — Other Ambulatory Visit: Payer: Self-pay | Admitting: Internal Medicine

## 2018-01-11 ENCOUNTER — Ambulatory Visit (INDEPENDENT_AMBULATORY_CARE_PROVIDER_SITE_OTHER): Payer: Medicare Other | Admitting: Nurse Practitioner

## 2018-01-11 ENCOUNTER — Encounter: Payer: Self-pay | Admitting: Nurse Practitioner

## 2018-01-11 VITALS — BP 159/67 | HR 78 | Temp 98.4°F | Ht 61.0 in | Wt 136.6 lb

## 2018-01-11 DIAGNOSIS — B370 Candidal stomatitis: Secondary | ICD-10-CM

## 2018-01-11 MED ORDER — NYSTATIN 100000 UNIT/ML MT SUSP: 5.0000 mL | Freq: Four times a day (QID) | OROMUCOSAL | 0 refills | Status: DC

## 2018-01-11 NOTE — Patient Instructions (Signed)
Oral Thrush, Adult Oral thrush is an infection in your mouth and throat. It causes Borror patches on your tongue and in your mouth. Follow these instructions at home: Helping with soreness  To lessen your pain: ? Drink cold liquids, like water and iced tea. ? Eat frozen ice pops or frozen juices. ? Eat foods that are easy to swallow, like gelatin and ice cream. ? Drink from a straw if the patches in your mouth are painful. General instructions   Take or use over-the-counter and prescription medicines only as told by your doctor. Medicine for oral thrush may be something to swallow, or it may be something to put on the infected area.  Eat plain yogurt that has live cultures in it. Read the label to make sure.  If you wear dentures: ? Take out your dentures before you go to bed. ? Brush them well. ? Soak them in a denture cleaner.  Rinse your mouth with warm salt-water many times a day. To make the salt-water mixture, completely dissolve 1/2-1 teaspoon of salt in 1 cup of warm water. Contact a doctor if:  Your problems are getting worse.  Your problems do not get better in less than 7 days with treatment.  Your infection is spreading. This may show as Kendra patches on the skin outside of your mouth.  You are nursing your baby and you have redness and pain in the nipples. This information is not intended to replace advice given to you by your health care provider. Make sure you discuss any questions you have with your health care provider. Document Released: 09/26/2009 Document Revised: 03/26/2016 Document Reviewed: 03/26/2016 Elsevier Interactive Patient Education  2017 [employer]  

## 2018-01-11 NOTE — Progress Notes (Signed)
   Subjective:    Patient ID: Crystal Browning, female    DOB: 1933-11-14, 84 y.o.   MRN: [id number]   Chief Complaint: Sore Throat (x 2 days. Patient states she noticed she had a Hillier patch on her tounge yesterday. Also, ringing in right ear x 1 week)   HPI Patient come sin today c/o tongue being sore and red. Started a few days ago.     Review of Systems  Constitutional: Negative for chills and fever.  HENT: Positive for sore throat and trouble swallowing. Negative for congestion and mouth sores.   Respiratory: Positive for choking (but this has ben going on for sometime due to treatments fpor throat cancer).   Cardiovascular: Negative.   Genitourinary: Negative.   Neurological: Negative.   Psychiatric/Behavioral: Negative.   All other systems reviewed and are negative.      Objective:   Physical Exam  Constitutional: She appears well-developed and well-nourished.  HENT:  Right Ear: Hearing, tympanic membrane and ear canal normal.  Left Ear: Hearing, tympanic membrane and ear canal normal.  Mouth/Throat: Posterior oropharyngeal erythema (had radiation for thorat cancer 5years ago) present. Tonsils are 0 on the right. Tonsils are 0 on the left.  Thick whitish brown film on tongue  Eyes: Pupils are equal, round, and reactive to light. EOM are normal.  Neck: Normal range of motion. Neck supple.  Cardiovascular: Normal rate.  Pulmonary/Chest: Effort normal.  Abdominal: Soft. Bowel sounds are normal.  Skin: Skin is warm.  Psychiatric: She has a normal mood and affect. Her behavior is normal.   BP (!) 159/67   Pulse 78   Temp 98.4 F (36.9 C) (Oral)   Ht 5\' 1"  (1.549 m)   Wt 136 lb 9.6 oz (62 kg)   BMI 25.81 kg/m         Assessment & Plan:  Crystal Browning in today with chief complaint of Sore Throat (x 2 days. Patient states she noticed she had a Swader patch on her tounge yesterday. Also, ringing in right ear x 1 week)   1. Thrush, oral Fore fluids rto prn -  nystatin (MYCOSTATIN) 100000 UNIT/ML suspension; Take 5 mLs (500,000 Units total) by mouth 4 (four) times daily.  Dispense: 60 mL; Refill: 0   Mary-Margaret Hassell Done, FNP

## 2018-01-13 ENCOUNTER — Telehealth: Payer: Self-pay | Admitting: Family Medicine

## 2018-01-13 NOTE — Telephone Encounter (Signed)
Apt made for 8:10 tomorrow morning - aware if gets worse go to the ER. Was seen Saturday for [place]- aware and verbalizes understanding.

## 2018-01-14 ENCOUNTER — Ambulatory Visit: Payer: Medicare Other | Admitting: Family Medicine

## 2018-01-14 DIAGNOSIS — R1314 Dysphagia, pharyngoesophageal phase: Secondary | ICD-10-CM | POA: Diagnosis not present

## 2018-01-14 DIAGNOSIS — Z8521 Personal history of malignant neoplasm of larynx: Secondary | ICD-10-CM | POA: Diagnosis not present

## 2018-01-14 DIAGNOSIS — Z923 Personal history of irradiation: Secondary | ICD-10-CM | POA: Diagnosis not present

## 2018-01-14 DIAGNOSIS — R682 Dry mouth, unspecified: Secondary | ICD-10-CM | POA: Diagnosis not present

## 2018-01-17 ENCOUNTER — Other Ambulatory Visit: Payer: Self-pay | Admitting: Family Medicine

## 2018-01-21 ENCOUNTER — Encounter: Payer: Self-pay | Admitting: Family Medicine

## 2018-01-27 ENCOUNTER — Other Ambulatory Visit: Payer: Self-pay | Admitting: Otolaryngology

## 2018-01-27 ENCOUNTER — Ambulatory Visit: Payer: Medicare Other | Admitting: *Deleted

## 2018-01-27 DIAGNOSIS — K117 Disturbances of salivary secretion: Secondary | ICD-10-CM

## 2018-01-27 DIAGNOSIS — R1314 Dysphagia, pharyngoesophageal phase: Secondary | ICD-10-CM

## 2018-01-27 DIAGNOSIS — R682 Dry mouth, unspecified: Secondary | ICD-10-CM

## 2018-01-27 DIAGNOSIS — Z923 Personal history of irradiation: Secondary | ICD-10-CM

## 2018-01-27 DIAGNOSIS — Z8521 Personal history of malignant neoplasm of larynx: Secondary | ICD-10-CM

## 2018-01-29 ENCOUNTER — Ambulatory Visit (INDEPENDENT_AMBULATORY_CARE_PROVIDER_SITE_OTHER): Payer: Medicare Other | Admitting: Family Medicine

## 2018-01-29 ENCOUNTER — Encounter: Payer: Self-pay | Admitting: Family Medicine

## 2018-01-29 VITALS — BP 206/80 | HR 63 | Temp 97.1°F | Ht 61.0 in | Wt 134.0 lb

## 2018-01-29 DIAGNOSIS — I10 Essential (primary) hypertension: Secondary | ICD-10-CM | POA: Diagnosis not present

## 2018-01-29 DIAGNOSIS — L299 Pruritus, unspecified: Secondary | ICD-10-CM

## 2018-01-29 DIAGNOSIS — I4891 Unspecified atrial fibrillation: Secondary | ICD-10-CM

## 2018-01-29 DIAGNOSIS — E039 Hypothyroidism, unspecified: Secondary | ICD-10-CM | POA: Diagnosis not present

## 2018-01-29 LAB — CMP14+EGFR
ALBUMIN: 4.8 g/dL — AB (ref 3.5–4.7)
ALT: 23 IU/L (ref 0–32)
AST: 36 IU/L (ref 0–40)
Albumin/Globulin Ratio: 1.8 (ref 1.2–2.2)
Alkaline Phosphatase: 71 IU/L (ref 39–117)
BUN/Creatinine Ratio: 16 (ref 12–28)
BUN: 13 mg/dL (ref 8–27)
Bilirubin Total: 0.6 mg/dL (ref 0.0–1.2)
CALCIUM: 9.8 mg/dL (ref 8.7–10.3)
CO2: 24 mmol/L (ref 20–29)
CREATININE: 0.81 mg/dL (ref 0.57–1.00)
Chloride: 102 mmol/L (ref 96–106)
GFR, EST AFRICAN AMERICAN: 78 mL/min/{1.73_m2} (ref >59)
GFR, EST NON AFRICAN AMERICAN: 68 mL/min/{1.73_m2} (ref >59)
GLUCOSE: 98 mg/dL (ref 65–99)
Globulin, Total: 2.6 g/dL (ref 1.5–4.5)
Potassium: 4.8 mmol/L (ref 3.5–5.2)
Sodium: 140 mmol/L (ref 134–144)
TOTAL PROTEIN: 7.4 g/dL (ref 6.0–8.5)

## 2018-01-29 MED ORDER — LEVOTHYROXINE SODIUM 50 MCG PO TABS: 50.0000 ug | ORAL_TABLET | Freq: Every day | ORAL | 6 refills | Status: DC

## 2018-01-29 MED ORDER — OLMESARTAN MEDOXOMIL 20 MG PO TABS: 20.0000 mg | ORAL_TABLET | Freq: Every day | ORAL | 2 refills | Status: DC

## 2018-01-29 MED ORDER — CETIRIZINE HCL 10 MG PO TABS: 10.0000 mg | ORAL_TABLET | Freq: Every day | ORAL | 3 refills | Status: DC

## 2018-01-31 ENCOUNTER — Ambulatory Visit
Admission: RE | Admit: 2018-01-31 | Discharge: 2018-01-31 | Disposition: A | Discharge status: Home / Self Care | Payer: Medicare Other | Source: Ambulatory Visit | Attending: Otolaryngology | Admitting: Otolaryngology

## 2018-01-31 DIAGNOSIS — K117 Disturbances of salivary secretion: Secondary | ICD-10-CM

## 2018-01-31 DIAGNOSIS — R1310 Dysphagia, unspecified: Secondary | ICD-10-CM | POA: Diagnosis not present

## 2018-01-31 DIAGNOSIS — Z8521 Personal history of malignant neoplasm of larynx: Secondary | ICD-10-CM

## 2018-01-31 DIAGNOSIS — R682 Dry mouth, unspecified: Secondary | ICD-10-CM

## 2018-01-31 DIAGNOSIS — R1314 Dysphagia, pharyngoesophageal phase: Secondary | ICD-10-CM

## 2018-01-31 DIAGNOSIS — Z923 Personal history of irradiation: Secondary | ICD-10-CM

## 2018-01-31 IMAGING — RF DG ESOPHAGUS
14 series · 14 of 24 positions shown · non-contrast
Comparison: Chest radiographs [DATE]. CT Abdomen and Pelvis
[DATE].

CLINICAL DATA: 84-year-old female with history of laryngeal cancer
treated with radiation, xerostomia. Pharyngo esophageal dysphagia.

EXAM:
ESOPHOGRAM/BARIUM SWALLOW
TECHNIQUE: Single contrast examination was performed using  thin barium.
FLUOROSCOPY TIME:  Fluoroscopy Time:  2 minutes 24 seconds
Radiation Exposure Index (if provided by the fluoroscopic device):
36 mGy
Number of Acquired Spot Images: 0

[Series 1: one shot · 1 of 1 slices shown (1 of 6)]
[im 1/1]
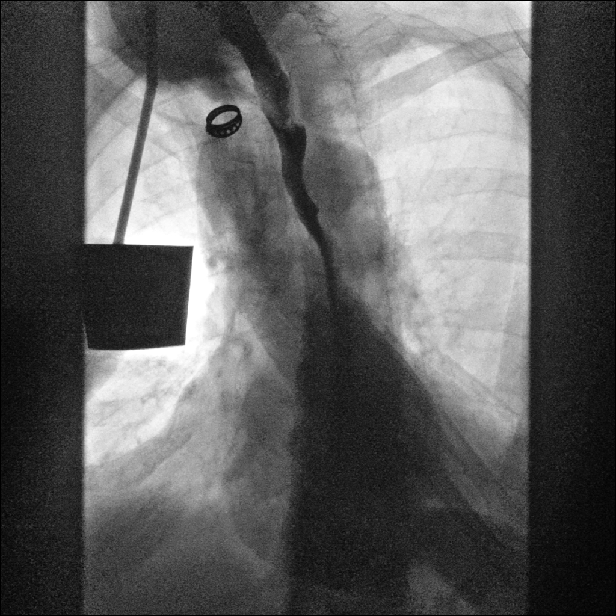

[Series 2: sequence · 1 of 20 frames shown (1 of 8)]
[frame 18/20]
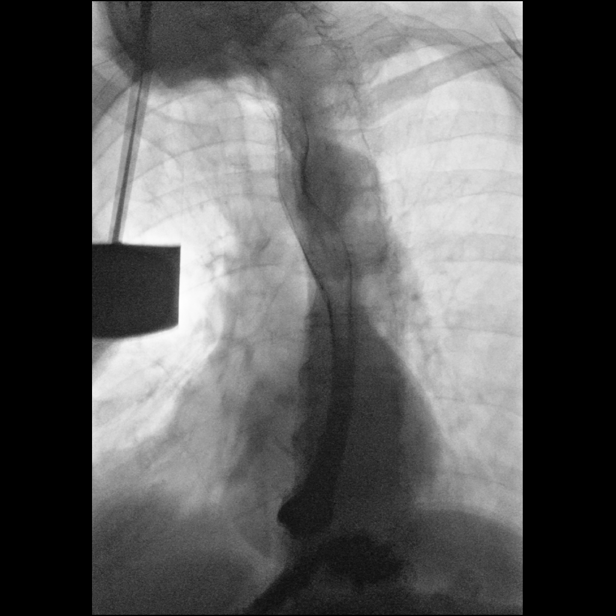

[Series 3: one shot · 1 of 4 slices shown (2 of 6)]
[im 4/4]
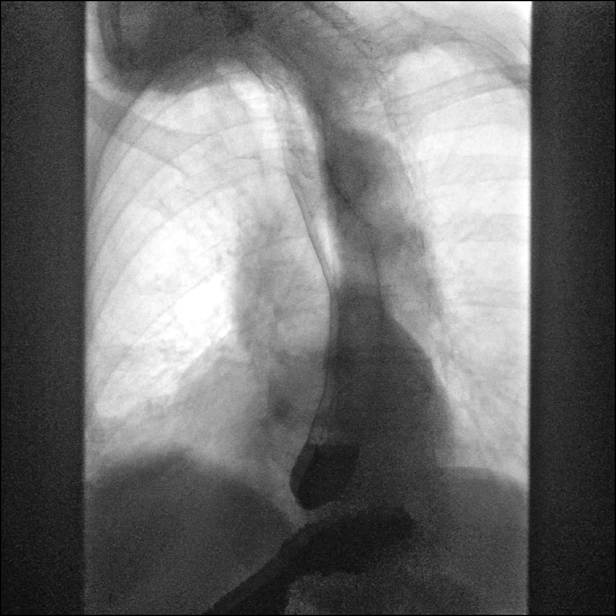

[Series 4: sequence · 1 of 19 frames shown (2 of 8)]
[frame 12/19]
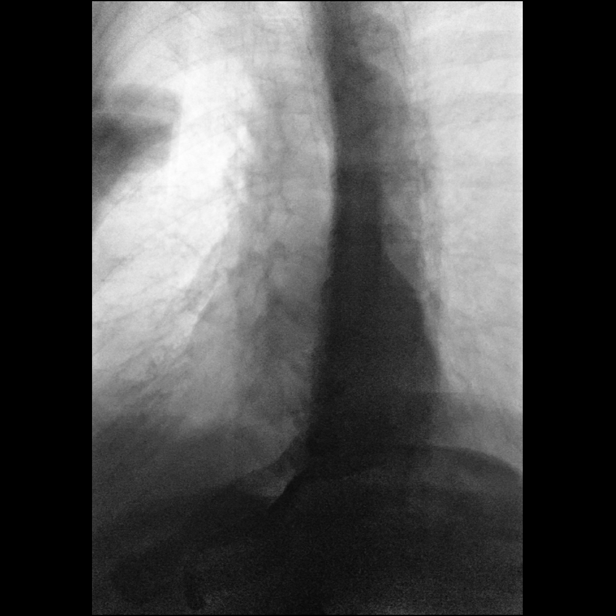

[Series 5: one shot · 1 of 2 slices shown (3 of 6)]
[im 1/2]
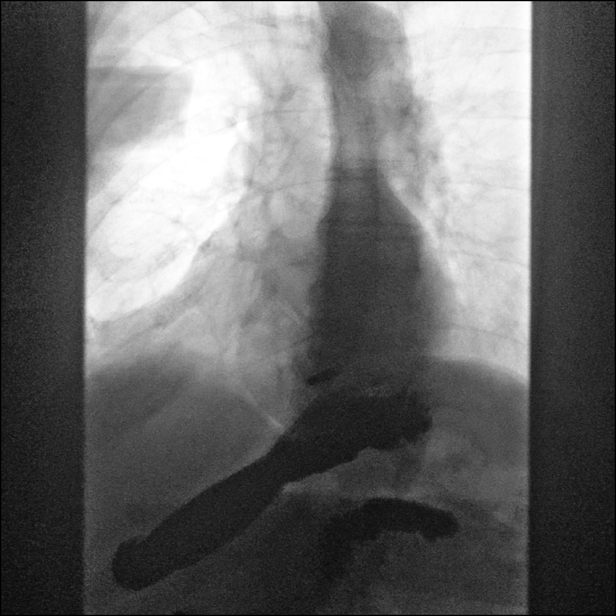

[Series 6: sequence · 1 of 16 frames shown (3 of 8)]
[frame 14/16]
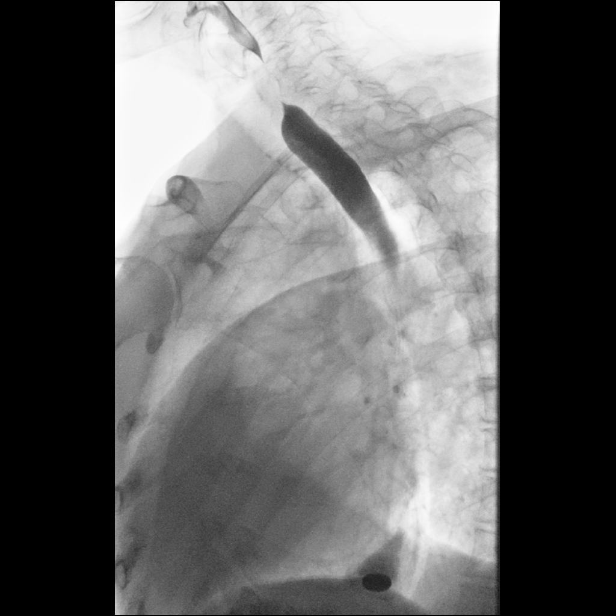

[Series 7: sequence · 1 of 17 frames shown (4 of 8)]
[frame 9/17]
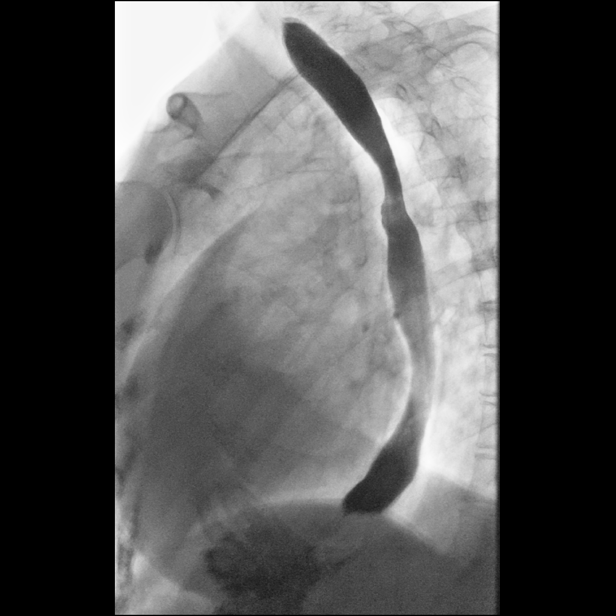

[Series 8: one shot · 1 of 1 slices shown (4 of 6)]
[im 1/1]
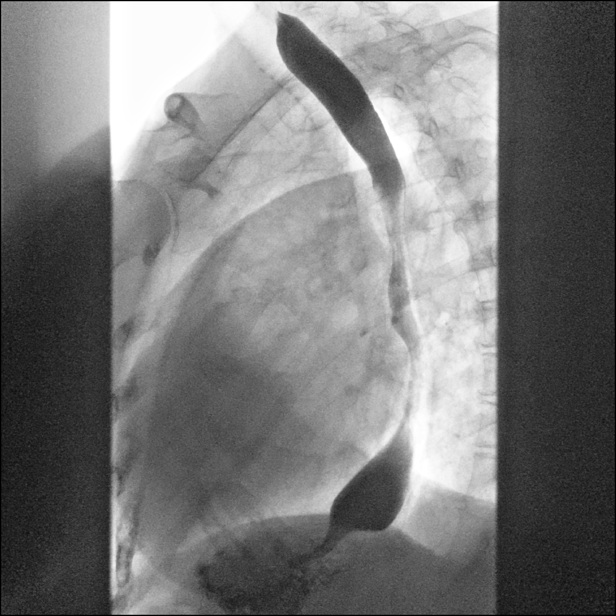

[Series 9: sequence · 1 of 47 frames shown (5 of 8)]
[frame 40/47]
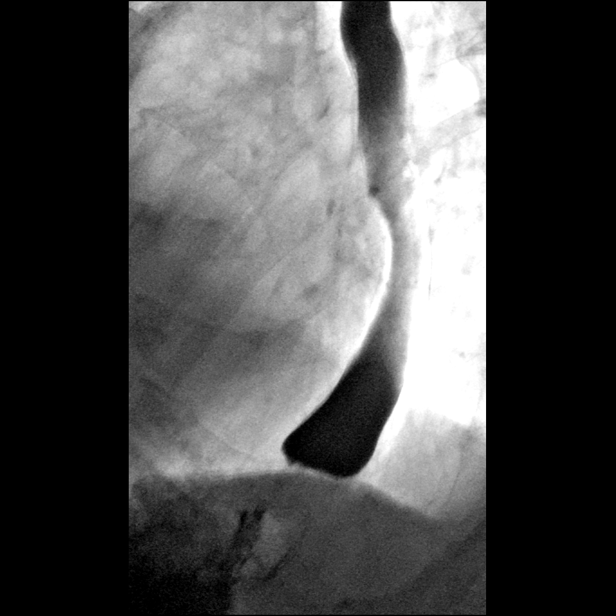

[Series 10: sequence · 1 of 9 frames shown (6 of 8)]
[frame 8/9]
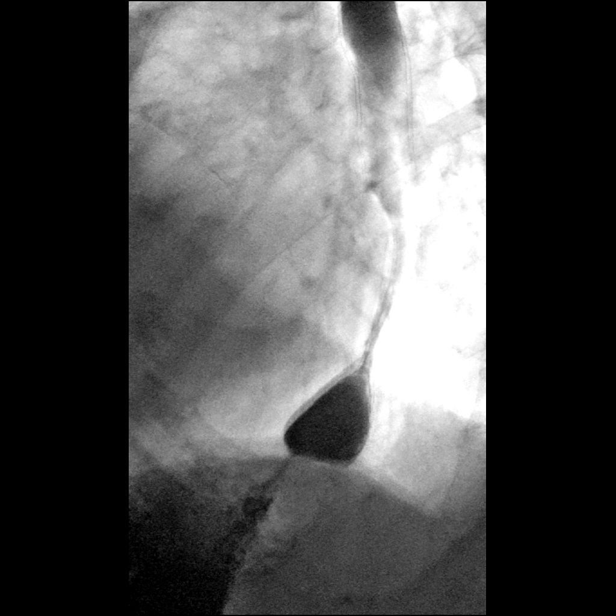

[Series 11: sequence · 1 of 27 frames shown (7 of 8)]
[frame 23/27]
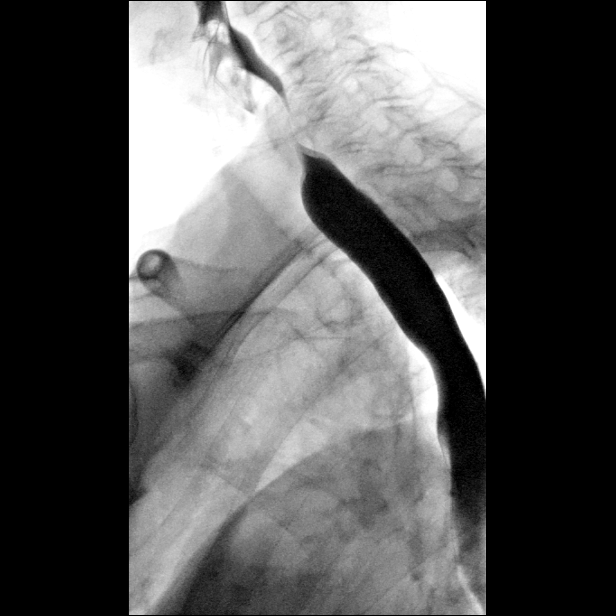

[Series 12: one shot · 1 of 1 slices shown (5 of 6)]
[im 1/1]
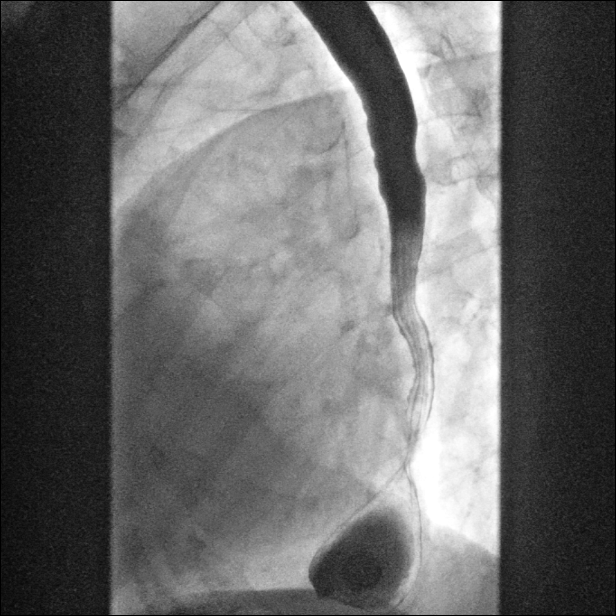

[Series 13: sequence · 1 of 17 frames shown (8 of 8)]
[frame 15/17]
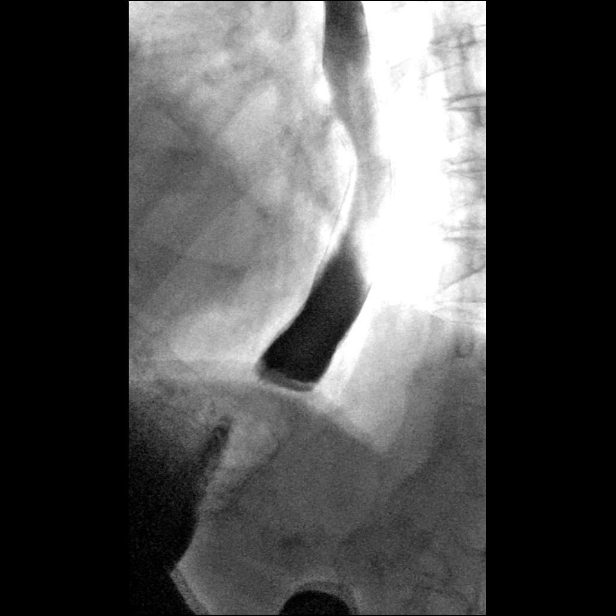

[Series 14: one shot · 1 of 4 slices shown (6 of 6)]
[im 4/4]
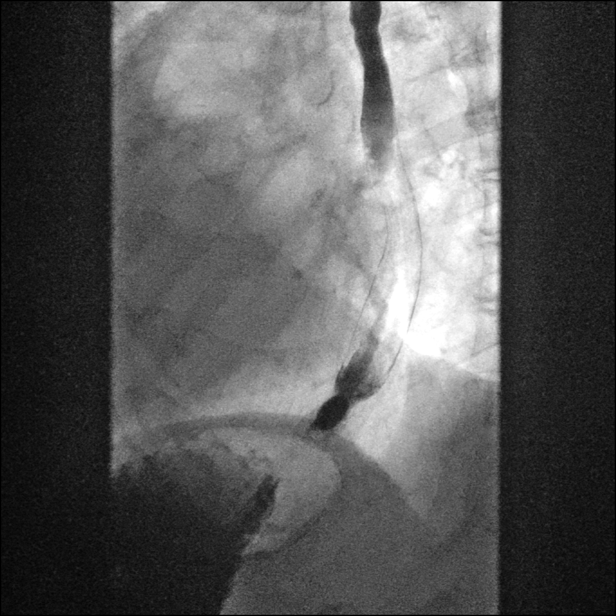

[14 of 24 positions shown; findings below may reference images not displayed]

FINDINGS: A single contrast study was undertaken in the patient tolerated this
well.

No obstruction to the forward flow of contrast throughout the
esophagus and into the stomach. Normal esophageal caliber and
contour. Intermittent esophageal contractions occurred at the level
of the aortic arch.

A capacious esophageal phrenic ampulla was noted with distal smooth
circumferential narrowing just proximal to the GEJ (series 9, image
4. This narrowing did not permit passage of the 12.5 millimeter
barium tablet throughout the study.

With prone swallows esophageal motility was poor although tertiary
contractions rarely occurred. To and fro motion of contrast from the
upper thoracic esophagus into the pharynx and even occasionally into
the oral cavity was demonstrated. No aspiration occurred.
IMPRESSION: 1. Smooth distal esophageal narrowing which did not permit passage
of the 12.5 mm barium tablet. This may reflect a hypertrophied
Schatzki's ring. Normal esophageal caliber elsewhere.
2. Poor esophageal motility, although tertiary contractions were
rare.
3. To and fro motion of contrast demonstrated from the proximal
esophagus into the pharynx and even occasionally refluxing into the
oral cavity. However, no aspiration occurred.

## 2018-02-02 ENCOUNTER — Encounter: Payer: Self-pay | Admitting: Family Medicine

## 2018-02-02 NOTE — Progress Notes (Signed)
Subjective:  Patient ID: Crystal Browning, female    DOB: [birth year]  Age: 84 y.o. MRN: [id number]  CC: Medical Management of Chronic Issues   HPI HELGA ASBURY presents for  follow-up of hypertension. Patient has no history of headache chest pain or shortness of breath or recent cough. Patient also denies symptoms of TIA such as focal numbness or weakness. Patient denies side effects from medication. States taking it regularly. Denies any change in health that would have caused the current elevation of BP, but very concerned about bringing it down.   Pt. Has no rash but is itching all over. Recurrent. Intermittent took pills for it in the past. Would like to take it again. Not sure what it was. Notes that the itch is making her very nervous and uncomfortable. Skin is crawling. Scratching until she bleeds at times.  Patient presents for follow-up on  thyroid. It has been stable recently. Pt. denies any change in  voice, loss of hair, heat or cold intolerance. Energy level has been adequate to good. Patient denies constipation and diarrhea. No myxedema. Medication is as noted below. Verified that pt is taking it daily on an empty stomach. Well tolerated.   History Crissie has a past medical history of Cancer ([place]) (03/28/11), Depression, Diverticulosis (02/2005), History of chemotherapy, Hypertension, Hypothyroid, Osteoporosis, Radiation, and TIA (transient ischemic attack).   She has a past surgical history that includes Direct laryngoscopy (03/27/2005); Tubal ligation; Total abdominal hysterectomy w/ bilateral salpingoophorectomy (1986); Cataract extraction, bilateral; and Cardioversion (N/A, 11/23/2016).   Her family history includes Alcohol abuse in her brother; COPD in her brother; Cancer (age of onset: 45) in her father; Heart disease (age of onset: 69) in her mother; Stroke in her mother.She reports that she has never smoked. She has never used smokeless tobacco. She reports that she does not drink  alcohol or use drugs.  Current Outpatient Medications on File Prior to Visit  Medication Sig Dispense Refill  . B Complex-Folic Acid (B COMPLEX-VITAMIN B12 PO) Take by mouth.    . Blood Pressure Monitor KIT Use to monitor Blood Pressure at home. 1 each 0  . clotrimazole-betamethasone (LOTRISONE) cream APPLY TO AFFECTED AREA TWICE A DAY 30 g 0  . ELIQUIS 5 MG TABS tablet TAKE 1 TABLET BY MOUTH TWICE A DAY 180 tablet 1  . escitalopram (LEXAPRO) 10 MG tablet Take 1 tablet (10 mg total) by mouth daily. 90 tablet 1  . metoprolol succinate (TOPROL-XL) 100 MG 24 hr tablet Take 1 tablet (100 mg total) by mouth daily. Take with or immediately following a meal. 90 tablet 1  . polyethylene glycol powder (GLYCOLAX/MIRALAX) powder Take 17 g by mouth 2 (two) times daily as needed for moderate constipation. For constipation (Patient taking differently: Take 17 g by mouth 2 (two) times daily. For constipation) 3350 g 5  . ranitidine (ZANTAC) 150 MG tablet Take 1 tablet (150 mg total) by mouth every 12 (twelve) hours. 180 tablet 1  . triamcinolone cream (KENALOG) 0.1 % APPLY TO AFFECTED AREA TWICE A DAY 30 g 0   No current facility-administered medications on file prior to visit.     ROS Review of Systems  Constitutional: Negative.   HENT: Negative.   Eyes: Negative for visual disturbance.  Respiratory: Negative for shortness of breath.   Cardiovascular: Negative for chest pain.  Gastrointestinal: Negative for abdominal pain.  Musculoskeletal: Negative for arthralgias.    Objective:  BP (!) 206/80 (BP Location: Right Arm)  Pulse 63   Temp (!) 97.1 F (36.2 C) (Oral)   Ht '5\' 1"'  (1.549 m)   Wt 134 lb (60.8 kg)   BMI 25.32 kg/m   BP Readings from Last 3 Encounters:  01/29/18 (!) 206/80  01/11/18 (!) 159/67  10/30/17 (!) 155/75    Wt Readings from Last 3 Encounters:  01/29/18 134 lb (60.8 kg)  01/11/18 136 lb 9.6 oz (62 kg)  10/30/17 134 lb 8 oz (61 kg)     Physical Exam    Constitutional: She is oriented to person, place, and time. She appears well-developed and well-nourished. No distress.  HENT:  Head: Normocephalic and atraumatic.  Eyes: Pupils are equal, round, and reactive to light. Conjunctivae are normal.  Neck: Normal range of motion. Neck supple. No thyromegaly present.  Cardiovascular: Normal rate, regular rhythm and normal heart sounds.  No murmur heard. Pulmonary/Chest: Effort normal and breath sounds normal. No respiratory distress. She has no wheezes. She has no rales.  Abdominal: Soft. She exhibits no distension. There is no tenderness.  Musculoskeletal: Normal range of motion.  Lymphadenopathy:    She has no cervical adenopathy.  Neurological: She is alert and oriented to person, place, and time.  Skin: Skin is warm and dry.  Psychiatric: She has a normal mood and affect. Her behavior is normal. Judgment and thought content normal.      Assessment & Plan:   Halee was seen today for medical management of chronic issues.  Diagnoses and all orders for this visit:  Accelerated hypertension -     CMP14+EGFR  Atrial fibrillation, unspecified type (HCC)  Hypothyroidism, unspecified type  Pruritus  Other orders -     cetirizine (ZYRTEC) 10 MG tablet; Take 1 tablet (10 mg total) by mouth daily. For itching -     olmesartan (BENICAR) 20 MG tablet; Take 1 tablet (20 mg total) by mouth daily. -     levothyroxine (SYNTHROID, LEVOTHROID) 50 MCG tablet; Take 1 tablet (50 mcg total) by mouth daily.   Allergies as of 01/29/2018      Reactions   Tuberculin Tests Swelling   Vit D-vit E-safflower Oil    Paroxetine Hcl Rash      Medication List        Accurate as of 01/29/18 11:59 PM. Always use your most recent med list.          B COMPLEX-VITAMIN B12 PO Take by mouth.   Blood Pressure Monitor Kit Use to monitor Blood Pressure at home.   cetirizine 10 MG tablet Commonly known as:  ZYRTEC Take 1 tablet (10 mg total) by mouth  daily. For itching   clotrimazole-betamethasone cream Commonly known as:  LOTRISONE APPLY TO AFFECTED AREA TWICE A DAY   ELIQUIS 5 MG Tabs tablet Generic drug:  apixaban TAKE 1 TABLET BY MOUTH TWICE A DAY   escitalopram 10 MG tablet Commonly known as:  LEXAPRO Take 1 tablet (10 mg total) by mouth daily.   levothyroxine 50 MCG tablet Commonly known as:  SYNTHROID, LEVOTHROID Take 1 tablet (50 mcg total) by mouth daily.   metoprolol succinate 100 MG 24 hr tablet Commonly known as:  TOPROL-XL Take 1 tablet (100 mg total) by mouth daily. Take with or immediately following a meal.   olmesartan 20 MG tablet Commonly known as:  BENICAR Take 1 tablet (20 mg total) by mouth daily.   polyethylene glycol powder powder Commonly known as:  GLYCOLAX/MIRALAX Take 17 g by mouth 2 (two) times daily as  needed for moderate constipation. For constipation   ranitidine 150 MG tablet Commonly known as:  ZANTAC Take 1 tablet (150 mg total) by mouth every 12 (twelve) hours.   triamcinolone cream 0.1 % Commonly known as:  KENALOG APPLY TO AFFECTED AREA TWICE A DAY       Meds ordered this encounter  Medications  . cetirizine (ZYRTEC) 10 MG tablet    Sig: Take 1 tablet (10 mg total) by mouth daily. For itching    Dispense:  90 tablet    Refill:  3  . olmesartan (BENICAR) 20 MG tablet    Sig: Take 1 tablet (20 mg total) by mouth daily.    Dispense:  30 tablet    Refill:  2  . levothyroxine (SYNTHROID, LEVOTHROID) 50 MCG tablet    Sig: Take 1 tablet (50 mcg total) by mouth daily.    Dispense:  30 tablet    Refill:  6      Follow-up: Return in about 1 month (around 03/01/2018).  [name], M.D.

## 2018-02-05 ENCOUNTER — Encounter: Payer: Self-pay | Admitting: Gastroenterology

## 2018-02-28 ENCOUNTER — Ambulatory Visit: Payer: Medicare Other | Admitting: Family Medicine

## 2018-02-28 ENCOUNTER — Ambulatory Visit (INDEPENDENT_AMBULATORY_CARE_PROVIDER_SITE_OTHER): Payer: Medicare Other | Admitting: Family Medicine

## 2018-02-28 ENCOUNTER — Encounter: Payer: Self-pay | Admitting: Family Medicine

## 2018-02-28 VITALS — BP 139/62 | HR 72 | Temp 97.5°F | Ht 61.0 in | Wt 134.1 lb

## 2018-02-28 DIAGNOSIS — Z7901 Long term (current) use of anticoagulants: Secondary | ICD-10-CM

## 2018-02-28 DIAGNOSIS — I10 Essential (primary) hypertension: Secondary | ICD-10-CM

## 2018-02-28 DIAGNOSIS — I4891 Unspecified atrial fibrillation: Secondary | ICD-10-CM | POA: Diagnosis not present

## 2018-02-28 NOTE — Progress Notes (Signed)
Subjective:  Patient ID: Crystal Browning, female    DOB: 05-28-1934  Age: 84 y.o. MRN: [id number]  CC: 1 month follow up   HPI Crystal Browning presents for  follow-up of hypertension. Patient has no history of headache chest pain or shortness of breath or recent cough. Patient also denies symptoms of TIA such as focal numbness or weakness.  Patient says that she was unable to get the Benicar because insurance would not cover it.  However her blush pressure is much better today because the family has been working on helping decrease her stress load.  Particularly there is one family member who seems to really upset her and raise her blood pressure.  This person has been out of town and has not communicated with Crystal Browning recently.   Patient in for follow-up of atrial fibrillation. Patient denies any recent bouts of chest pain or palpitations. Additionally, patient is taking anticoagulants. Patient denies any recent excessive bleeding episodes including epistaxis, bleeding from the gums, genitalia, rectal bleeding or hematuria. Additionally there has been no excessive bruising.  There are a few small bruises on the forearms that the patient wants checked.  History Crystal Browning has a past medical history of Cancer ([place]) (03/28/11), Depression, Diverticulosis (02/2005), History of chemotherapy, Hypertension, Hypothyroid, Osteoporosis, Radiation, and TIA (transient ischemic attack).   She has a past surgical history that includes Direct laryngoscopy (03/27/2005); Tubal ligation; Total abdominal hysterectomy w/ bilateral salpingoophorectomy (1986); Cataract extraction, bilateral; and Cardioversion (N/A, 11/23/2016).   Her family history includes Alcohol abuse in her brother; COPD in her brother; Cancer (age of onset: 80) in her father; Heart disease (age of onset: 54) in her mother; Stroke in her mother.She reports that she has never smoked. She has never used smokeless tobacco. She reports that she does not drink  alcohol or use drugs.  Current Outpatient Medications on File Prior to Visit  Medication Sig Dispense Refill  . B Complex-Folic Acid (B COMPLEX-VITAMIN B12 PO) Take by mouth.    . Blood Pressure Monitor KIT Use to monitor Blood Pressure at home. 1 each 0  . cetirizine (ZYRTEC) 10 MG tablet Take 1 tablet (10 mg total) by mouth daily. For itching 90 tablet 3  . clotrimazole-betamethasone (LOTRISONE) cream APPLY TO AFFECTED AREA TWICE A DAY 30 g 0  . ELIQUIS 5 MG TABS tablet TAKE 1 TABLET BY MOUTH TWICE A DAY 180 tablet 1  . escitalopram (LEXAPRO) 10 MG tablet Take 1 tablet (10 mg total) by mouth daily. 90 tablet 1  . levothyroxine (SYNTHROID, LEVOTHROID) 50 MCG tablet Take 1 tablet (50 mcg total) by mouth daily. 30 tablet 6  . metoprolol succinate (TOPROL-XL) 100 MG 24 hr tablet Take 1 tablet (100 mg total) by mouth daily. Take with or immediately following a meal. 90 tablet 1  . olmesartan (BENICAR) 20 MG tablet Take 1 tablet (20 mg total) by mouth daily. 30 tablet 2  . polyethylene glycol powder (GLYCOLAX/MIRALAX) powder Take 17 g by mouth 2 (two) times daily as needed for moderate constipation. For constipation (Patient taking differently: Take 17 g by mouth 2 (two) times daily. For constipation) 3350 g 5  . ranitidine (ZANTAC) 150 MG tablet Take 1 tablet (150 mg total) by mouth every 12 (twelve) hours. 180 tablet 1  . triamcinolone cream (KENALOG) 0.1 % APPLY TO AFFECTED AREA TWICE A DAY 30 g 0   No current facility-administered medications on file prior to visit.     ROS Review of Systems  Constitutional: Negative.   HENT: Negative.   Eyes: Negative for visual disturbance.  Respiratory: Negative for shortness of breath.   Cardiovascular: Negative for chest pain.  Gastrointestinal: Negative for abdominal pain.  Musculoskeletal: Negative for arthralgias.    Objective:  BP 139/62   Pulse 72   Temp (!) 97.5 F (36.4 C) (Oral)   Ht '5\' 1"'  (1.549 m)   Wt 134 lb 2 oz (60.8 kg)   BMI  25.34 kg/m   BP Readings from Last 3 Encounters:  02/28/18 139/62  01/29/18 (!) 206/80  01/11/18 (!) 159/67    Wt Readings from Last 3 Encounters:  02/28/18 134 lb 2 oz (60.8 kg)  01/29/18 134 lb (60.8 kg)  01/11/18 136 lb 9.6 oz (62 kg)     Physical Exam  Constitutional: She is oriented to person, place, and time. She appears well-developed and well-nourished. No distress.  Cardiovascular: Normal rate and regular rhythm.  Pulmonary/Chest: Breath sounds normal.  Neurological: She is alert and oriented to person, place, and time.  Skin: Skin is warm and dry.  There are scattered over the forearm perhaps 3-4 hematomas bilaterally these range from 6 to 12 mm in size.  Of note is that the patient's skin is very thin and fragile.  Psychiatric: She has a normal mood and affect.      Assessment & Plan:   Crystal Browning was seen today for 1 month follow up.  Diagnoses and all orders for this visit:  Essential hypertension  Atrial fibrillation, unspecified type ([place])  Current use of long term anticoagulation   Allergies as of 02/28/2018      Reactions   Tuberculin Tests Swelling   Vit D-vit E-safflower Oil    Paroxetine Hcl Rash      Medication List        Accurate as of 02/28/18  1:34 PM. Always use your most recent med list.          B COMPLEX-VITAMIN B12 PO Take by mouth.   Blood Pressure Monitor Kit Use to monitor Blood Pressure at home.   cetirizine 10 MG tablet Commonly known as:  ZYRTEC Take 1 tablet (10 mg total) by mouth daily. For itching   clotrimazole-betamethasone cream Commonly known as:  LOTRISONE APPLY TO AFFECTED AREA TWICE A DAY   ELIQUIS 5 MG Tabs tablet Generic drug:  apixaban TAKE 1 TABLET BY MOUTH TWICE A DAY   escitalopram 10 MG tablet Commonly known as:  LEXAPRO Take 1 tablet (10 mg total) by mouth daily.   levothyroxine 50 MCG tablet Commonly known as:  SYNTHROID, LEVOTHROID Take 1 tablet (50 mcg total) by mouth daily.     metoprolol succinate 100 MG 24 hr tablet Commonly known as:  TOPROL-XL Take 1 tablet (100 mg total) by mouth daily. Take with or immediately following a meal.   olmesartan 20 MG tablet Commonly known as:  BENICAR Take 1 tablet (20 mg total) by mouth daily.   polyethylene glycol powder powder Commonly known as:  GLYCOLAX/MIRALAX Take 17 g by mouth 2 (two) times daily as needed for moderate constipation. For constipation   ranitidine 150 MG tablet Commonly known as:  ZANTAC Take 1 tablet (150 mg total) by mouth every 12 (twelve) hours.   triamcinolone cream 0.1 % Commonly known as:  KENALOG APPLY TO AFFECTED AREA TWICE A DAY       No orders of the defined types were placed in this encounter.   As long as stress can be managed, stay  off the benicar. Will find one that medicaid will cover if BP climbs. They havev neighbor who can check for her.  Follow-up: Return in about 3 months (around 05/31/2018).  [name], M.D.

## 2018-03-03 ENCOUNTER — Telehealth: Payer: Self-pay | Admitting: Emergency Medicine

## 2018-03-03 ENCOUNTER — Encounter: Payer: Self-pay | Admitting: Gastroenterology

## 2018-03-03 ENCOUNTER — Ambulatory Visit (INDEPENDENT_AMBULATORY_CARE_PROVIDER_SITE_OTHER): Payer: Medicare Other | Admitting: Gastroenterology

## 2018-03-03 VITALS — BP 144/76 | HR 72 | Ht 61.0 in | Wt 134.0 lb

## 2018-03-03 DIAGNOSIS — K222 Esophageal obstruction: Secondary | ICD-10-CM | POA: Diagnosis not present

## 2018-03-03 DIAGNOSIS — Z7901 Long term (current) use of anticoagulants: Secondary | ICD-10-CM | POA: Diagnosis not present

## 2018-03-03 DIAGNOSIS — R1310 Dysphagia, unspecified: Secondary | ICD-10-CM | POA: Diagnosis not present

## 2018-03-03 HISTORY — DX: Esophageal obstruction: K22.2

## 2018-03-03 NOTE — Progress Notes (Signed)
Physician assistant assessment and plan reviewed. High risk patient with distal esophageal stricture that is symptomatic. Needs dilation. Cardiologist to assist regarding management of chronic anticoagulation

## 2018-03-03 NOTE — Progress Notes (Signed)
03/03/2018 Crystal Browning [id number] [birth year]   HISTORY OF PRESENT ILLNESS: This is an 84 year old female who is new to our office.  She presents to here today at the request of Dr. Constance Holster with ENT in order to discuss endoscopy with dilation.  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  Apparently she has been having issues with difficulty swallowing.  Describes food getting hung up as they go down.  Then starts coughing excessively as well.  She was seen by Dr. Constance Holster and an esophagram was ordered and showed the following:  IMPRESSION: 1. Smooth distal esophageal narrowing which did not permit passage of the 12.5 mm barium tablet. This may reflect a hypertrophied Schatzki's ring. Normal esophageal caliber elsewhere. 2. Poor esophageal motility, although tertiary contractions were rare. 3. To and fro motion of contrast demonstrated from the proximal esophagus into the pharynx and even occasionally refluxing into the oral cavity. However, no aspiration occurred.  She is on Eliquis twice daily for paroxysmal atrial fibrillation.  Her cardiologist is Dr. Debara Pickett.  In regards to reflux medication she is only on Zantac 150 mg BID.   Past Medical History:  Diagnosis Date  . Cancer ([place]) 03/28/11   SCCA of the Supraglottic Larynx ([place]2NoMo)    ; S/P Chemoradiation therapy from 05/09/05 thru 06/29/05  . Depression    History of Depression- Lexapro therapy  . Diverticulosis 02/2005   History of diverticulosis with admission from 8/18 - 03/04/2005  . History of chemotherapy    S/P chemotherapy with Dr. Sonny Dandy -2006  . Hypertension   . Hypothyroid   . Osteoporosis    History of Osteoporosis with one year of Actonel only  . Radiation    S/P radiation thrapy -06/2005  . TIA (transient ischemic attack)    History of TIA's with no residual effect   Past Surgical History:  Procedure Laterality Date  . CARDIOVERSION N/A 11/23/2016   Procedure: CARDIOVERSION;  Surgeon: Pixie Casino, MD;  Location: [facility];  Service: Cardiovascular;  Laterality: N/A;  . CATARACT EXTRACTION, BILATERAL    . DIRECT LARYNGOSCOPY  03/27/2005   S/P direct laryngoscopy, esophagoscopy and biopsy with Dr. Constance Holster  . TOTAL ABDOMINAL HYSTERECTOMY W/ BILATERAL SALPINGOOPHORECTOMY  1986  . TUBAL LIGATION      reports that she has never smoked. She has never used smokeless tobacco. She reports that she does not drink alcohol or use drugs. family history includes Alcohol abuse in her brother; COPD in her brother; Cancer (age of onset: 23) in her father; Heart disease (age of onset: 48) in her mother; Stroke in her mother. Allergies  Allergen Reactions  . Tuberculin Tests Swelling  . Vit D-Vit E-Safflower Oil   . Paroxetine Hcl Rash      Outpatient Encounter Medications as of 03/03/2018  Medication Sig  . B Complex-Folic Acid (B COMPLEX-VITAMIN B12 PO) Take by mouth.  . Blood Pressure Monitor KIT Use to monitor Blood Pressure at home.  . cetirizine (ZYRTEC) 10 MG tablet Take 1 tablet (10 mg total) by mouth daily. For itching  . clotrimazole-betamethasone (LOTRISONE) cream APPLY TO AFFECTED AREA TWICE A DAY  . ELIQUIS 5 MG TABS tablet TAKE 1 TABLET BY MOUTH TWICE A DAY  . escitalopram (LEXAPRO) 10 MG tablet Take 1 tablet (10 mg total) by mouth daily.  Marland Kitchen levothyroxine (SYNTHROID, LEVOTHROID) 50 MCG tablet Take 1 tablet (50 mcg total) by mouth daily.  . metoprolol succinate (  TOPROL-XL) 100 MG 24 hr tablet Take 1 tablet (100 mg total) by mouth daily. Take with or immediately following a meal.  . olmesartan (BENICAR) 20 MG tablet Take 1 tablet (20 mg total) by mouth daily.  . polyethylene glycol powder (GLYCOLAX/MIRALAX) powder Take 17 g by mouth 2 (two) times daily as needed for moderate constipation. For constipation (Patient taking differently: Take 17 g by mouth 2 (two) times daily. For constipation)  . ranitidine (ZANTAC) 150 MG tablet Take 1  tablet (150 mg total) by mouth every 12 (twelve) hours.  . triamcinolone cream (KENALOG) 0.1 % APPLY TO AFFECTED AREA TWICE A DAY   No facility-administered encounter medications on file as of 03/03/2018.      REVIEW OF SYSTEMS  : All other systems reviewed and negative except where noted in the History of Present Illness.   PHYSICAL EXAM: BP (!) 144/76   Pulse 72   Ht '5\' 1"'  (1.549 m)   Wt 134 lb (60.8 kg)   BMI 25.32 kg/m  General: Well developed Bruss female in no acute distress Head: Normocephalic and atraumatic Eyes:  Sclerae anicteric, conjunctiva pink. Ears: Normal auditory acuity Lungs: Clear throughout to auscultation; no increased WOB. Heart: Regular rate and rhythm; no M/R/G. Abdomen: Soft, non-distended.  BS present.  Non-tender. Musculoskeletal: Symmetrical with no gross deformities  Skin: No lesions on visible extremities Extremities: No edema  Neurological: Alert oriented x 4, grossly non-focal Psychological:  Alert and cooperative. Normal mood and affect  ASSESSMENT AND PLAN: *Dysphagia:  Esophageal stricture/Schatzki's ring seen on esophagram that did not allow passage of barium tablet.  Will schedule for EGD with dilation with Dr. Henrene Pastor. *Chronic anticoagulation with Eliquis for PAF:  Will hold Eliquis for 2 days prior to endoscopic procedures - will instruct when and how to resume after procedure. Benefits and risks of procedure explained including risks of bleeding, perforation, infection, missed lesions, reactions to medications and possible need for hospitalization and surgery for complications. Additional rare but real risk of stroke or other vascular clotting events off of Eliquis also explained and need to seek urgent help if any signs of these problems occur. Will communicate by phone or EMR with patient's prescribing provider, Dr. Debara Pickett, to confirm that holding Plavix is reasonable in this case.    CC:  [name], MD

## 2018-03-03 NOTE — Patient Instructions (Signed)

## 2018-03-03 NOTE — Telephone Encounter (Signed)
[facility] Pre-operative Risk Assessment     Request for surgical clearance:     Endoscopy Procedure  What type of surgery is being performed?     colonscopy   When is this surgery scheduled?     03-19-18  What type of clearance is required ?   Pharmacy  Are there any medications that need to be held prior to surgery and how long? Eliquis 2  Practice name and name of physician performing surgery?      [facility]  What is your office phone and fax number?      Phone- [phone]  [phone]  Anesthesia type (None, local, MAC, general) ?       MAC

## 2018-03-04 NOTE — Telephone Encounter (Signed)
Routing to pharmacy.  Judyann Casasola C. Veatrice Eckstein, RN, ANP-C [facility] [address] [place], [place]  [address] [phone]  

## 2018-03-04 NOTE — Telephone Encounter (Signed)
Routed to [place], RN, [facility] [address] [place], [place]  [address] [phone]

## 2018-03-04 NOTE — Telephone Encounter (Signed)
Left message on patients voicemail to call office.   

## 2018-03-04 NOTE — Telephone Encounter (Signed)
Pt takes Eliquis for afib with 17 (age x2, sex, HTN, TIA). CrCl is 80mL/min. Recommend holding Eliquis for only 24 hours prior to procedure due to history of afib with TIA.

## 2018-03-05 NOTE — Telephone Encounter (Signed)
Spoke to patients daughter and clarified the instructions she verbalized understanding.

## 2018-03-05 NOTE — Telephone Encounter (Addendum)
Daughter [name] called to confirm that Crystal Browning needs to be stopped on Monday 03-17-18 after the am dose. Tells me she will call back If she or sisters have any other questions.

## 2018-03-05 NOTE — Telephone Encounter (Signed)
I want the last dose 2 days before the procedure. Thus, no dose day before the procedure or the day of the procedure. Thank you

## 2018-03-05 NOTE — Telephone Encounter (Signed)
FYI Dr. Henrene Pastor patients cardiologist wants her to hold Eliquis 24 hours before procedure.

## 2018-03-19 ENCOUNTER — Ambulatory Visit (AMBULATORY_SURGERY_CENTER): Payer: Medicare Other | Admitting: Internal Medicine

## 2018-03-19 ENCOUNTER — Encounter: Payer: Self-pay | Admitting: Internal Medicine

## 2018-03-19 VITALS — BP 155/70 | HR 71 | Temp 98.4°F | Resp 16 | Ht 61.0 in | Wt 134.0 lb

## 2018-03-19 DIAGNOSIS — I272 Pulmonary hypertension, unspecified: Secondary | ICD-10-CM | POA: Diagnosis not present

## 2018-03-19 DIAGNOSIS — K222 Esophageal obstruction: Secondary | ICD-10-CM

## 2018-03-19 DIAGNOSIS — I4891 Unspecified atrial fibrillation: Secondary | ICD-10-CM | POA: Diagnosis not present

## 2018-03-19 DIAGNOSIS — R1310 Dysphagia, unspecified: Secondary | ICD-10-CM

## 2018-03-19 DIAGNOSIS — I10 Essential (primary) hypertension: Secondary | ICD-10-CM | POA: Diagnosis not present

## 2018-03-19 DIAGNOSIS — I440 Atrioventricular block, first degree: Secondary | ICD-10-CM | POA: Diagnosis not present

## 2018-03-19 DIAGNOSIS — Z8673 Personal history of transient ischemic attack (TIA), and cerebral infarction without residual deficits: Secondary | ICD-10-CM | POA: Diagnosis not present

## 2018-03-19 DIAGNOSIS — K219 Gastro-esophageal reflux disease without esophagitis: Secondary | ICD-10-CM | POA: Diagnosis not present

## 2018-03-19 MED ORDER — SODIUM CHLORIDE 0.9 % IV SOLN: 500.0000 mL | Freq: Once | INTRAVENOUS | Status: DC

## 2018-03-19 MED ORDER — OMEPRAZOLE 20 MG PO CPDR: 20.0000 mg | DELAYED_RELEASE_CAPSULE | Freq: Every day | ORAL | 11 refills | Status: DC

## 2018-03-19 NOTE — Patient Instructions (Signed)
Use dilation diet today and chew food well always Resume Eliquis at prior dose today Stop Rantidine. Start Omeprazole 20mg  daily; #30 with 11 refills   YOU HAD AN ENDOSCOPIC PROCEDURE TODAY AT [facility]:   Refer to the procedure report that was given to you for any specific questions about what was found during the examination.  If the procedure report does not answer your questions, please call your gastroenterologist to clarify.  If you requested that your care partner not be given the details of your procedure findings, then the procedure report has been included in a sealed envelope for you to review at your convenience later.  YOU SHOULD EXPECT: Some feelings of bloating in the abdomen. Passage of more gas than usual.  Walking can help get rid of the air that was put into your GI tract during the procedure and reduce the bloating. If you had a lower endoscopy (such as a colonoscopy or flexible sigmoidoscopy) you may notice spotting of blood in your stool or on the toilet paper. If you underwent a bowel prep for your procedure, you may not have a normal bowel movement for a few days.  Please Note:  You might notice some irritation and congestion in your nose or some drainage.  This is from the oxygen used during your procedure.  There is no need for concern and it should clear up in a day or so.  SYMPTOMS TO REPORT IMMEDIATELY:  Following upper endoscopy (EGD)  Vomiting of blood or coffee ground material  New chest pain or pain under the shoulder blades  Painful or persistently difficult swallowing  New shortness of breath  Fever of 100F or higher  Black, tarry-looking stools  For urgent or emergent issues, a gastroenterologist can be reached at any hour by calling [phone].   DIET:  We do recommend a small meal at first, but then you may proceed to your regular diet.  Drink plenty of fluids but you should avoid alcoholic beverages for 24 hours.  ACTIVITY:   You should plan to take it easy for the rest of today and you should NOT DRIVE or use heavy machinery until tomorrow (because of the sedation medicines used during the test).    FOLLOW UP: Our staff will call the number listed on your records the next business day following your procedure to check on you and address any questions or concerns that you may have regarding the information given to you following your procedure. If we do not reach you, we will leave a message.  However, if you are feeling well and you are not experiencing any problems, there is no need to return our call.  We will assume that you have returned to your regular daily activities without incident.  If any biopsies were taken you will be contacted by phone or by letter within the next 1-3 weeks.  Please call us at [phone] if you have not heard about the biopsies in 3 weeks.    SIGNATURES/CONFIDENTIALITY: You and/or your care partner have signed paperwork which will be entered into your electronic medical record.  These signatures attest to the fact that that the information above on your After Visit Summary has been reviewed and is understood.  Full responsibility of the confidentiality of this discharge information lies with you and/or your care-partner.

## 2018-03-19 NOTE — Progress Notes (Signed)
Report to PACU, RN, vss, BBS= Clear.  

## 2018-03-19 NOTE — Progress Notes (Signed)
Pt's states no medical or surgical changes since previsit or office visit. 

## 2018-03-19 NOTE — Progress Notes (Signed)
Called to room to assist during endoscopic procedure.  Patient ID and intended procedure confirmed with present staff. Received instructions for my participation in the procedure from the performing physician.  

## 2018-03-19 NOTE — Op Note (Signed)
[facility] Patient Name: Crystal Browning Procedure Date: 03/19/2018 10:56 AM MRN: [id number] Endoscopist: Docia Chuck. Henrene Pastor , MD Age: 84 Referring MD:  Date of Birth: [birth year] Gender: Female Account #: [id number] Procedure:                Upper GI endoscopy, with Venia Minks dilation of the                            esophagus?"15F Indications:              Dysphagia, Abnormal cine-esophagram Medicines:                Monitored Anesthesia Care Procedure:                Pre-Anesthesia Assessment:                           - Prior to the procedure, a History and Physical                            was performed, and patient medications and                            allergies were reviewed. The patient's tolerance of                            previous anesthesia was also reviewed. The risks                            and benefits of the procedure and the sedation                            options and risks were discussed with the patient.                            All questions were answered, and informed consent                            was obtained. Prior Anticoagulants: The patient has                            taken Eliquis (apixaban), last dose was 2 days                            prior to procedure. ASA Grade Assessment: III - A                            patient with severe systemic disease. After                            reviewing the risks and benefits, the patient was                            deemed in satisfactory condition to undergo the  procedure.                           After obtaining informed consent, the endoscope was                            passed under direct vision. Throughout the                            procedure, the patient's blood pressure, pulse, and                            oxygen saturations were monitored continuously. The                            Model [id number]-[id number] scope was introduced                  through the mouth, and advanced to the second part                            of duodenum. The upper GI endoscopy was                            accomplished without difficulty. The patient                            tolerated the procedure well. Scope In: Scope Out: Findings:                 The posterior pharynx revealed scarring and                            narrowing previous cancer treatment. The esophagus                            revealed very mild narrowing in the region of the                            gastroesophageal junction with mild esophagitis.                            After the endoscopic survey was completed, the                            scope was withdrawn. Dilation was performed with a                            Maloney dilator with mild resistance at 50 Fr.                           The stomach was normal.                           The examined duodenum was normal.  The cardia and gastric fundus were normal on                            retroflexion. Complications:            No immediate complications. Estimated Blood Loss:     Estimated blood loss: none. Impression:               1. Mild scarring and narrowing of the posterior                            pharynx from prior cancer treatment                           2. Mild esophageal stricture and mild esophagitis                            status post dilation                           3. Otherwise unremarkable exam. Recommendation:           - Patient has a contact number available for                            emergencies. The signs and symptoms of potential                            delayed complications were discussed with the                            patient. Return to normal activities tomorrow.                            Written discharge instructions were provided to the                            patient.                           - Post dilation diet.  Also, chew food well.                           - Continue present medications.                           - Resume Eliquis (apixaban) at prior dose today.                           - Stop ranitidine. Please prescribe omeprazole 20                            mg daily; #30; 11 refills                           - Returned care of your primary provider. GI  follow-up as needed Docia Chuck. Henrene Pastor, MD 03/19/2018 11:18:47 AM This report has been signed electronically.

## 2018-03-20 ENCOUNTER — Telehealth: Payer: Self-pay

## 2018-03-20 NOTE — Telephone Encounter (Signed)
  Follow up Call-  Call back number 03/19/2018  Post procedure Call Back phone  # [phone]  Permission to leave phone message Yes  Some recent data might be hidden     Patient questions:  Do you have a fever, pain , or abdominal swelling? No. Pain Score  0 *  Have you tolerated food without any problems? Yes.    Have you been able to return to your normal activities? Yes.    Do you have any questions about your discharge instructions: Diet   No. Medications  No. Follow up visit  No.  Do you have questions or concerns about your Care? No.  Actions: * If pain score is 4 or above: No action needed, pain <4.

## 2018-03-28 ENCOUNTER — Other Ambulatory Visit: Payer: Self-pay | Admitting: Family Medicine

## 2018-05-12 ENCOUNTER — Telehealth: Payer: Self-pay | Admitting: Internal Medicine

## 2018-05-14 NOTE — Telephone Encounter (Signed)
Spoke with patient's daughter and told her to call patient's insurance company and find out what PPI might be cheaper than Omeprazole 20mg .  I also told her patient could stop Zantac and replace it with Pepcid 20mg .  She agreed.  At her request I called patient and relayed this information to her as well.

## 2018-05-14 NOTE — Telephone Encounter (Signed)
Patient daughter is calling regarding meds (zantac)

## 2018-05-22 ENCOUNTER — Encounter: Payer: Self-pay | Admitting: *Deleted

## 2018-05-23 ENCOUNTER — Telehealth: Payer: Self-pay | Admitting: Internal Medicine

## 2018-05-23 ENCOUNTER — Other Ambulatory Visit: Payer: Self-pay | Admitting: Family Medicine

## 2018-05-27 MED ORDER — FAMOTIDINE 20 MG PO TABS: 20.0000 mg | ORAL_TABLET | Freq: Two times a day (BID) | ORAL | 3 refills | Status: DC

## 2018-05-27 NOTE — Telephone Encounter (Signed)
Spoke with patient and told her I could send in Pepcid to replace the Zantac.  Patient agreed.  Rx sent.

## 2018-06-02 ENCOUNTER — Ambulatory Visit: Payer: Medicare Other | Admitting: Family Medicine

## 2018-06-06 ENCOUNTER — Other Ambulatory Visit: Payer: Self-pay | Admitting: Internal Medicine

## 2018-06-11 ENCOUNTER — Encounter: Payer: Self-pay | Admitting: Family Medicine

## 2018-06-11 ENCOUNTER — Ambulatory Visit (INDEPENDENT_AMBULATORY_CARE_PROVIDER_SITE_OTHER): Payer: Medicare Other | Admitting: Family Medicine

## 2018-06-11 VITALS — BP 146/67 | HR 76 | Temp 97.5°F | Ht 61.0 in | Wt 132.2 lb

## 2018-06-11 DIAGNOSIS — E039 Hypothyroidism, unspecified: Secondary | ICD-10-CM

## 2018-06-11 DIAGNOSIS — Z7901 Long term (current) use of anticoagulants: Secondary | ICD-10-CM | POA: Diagnosis not present

## 2018-06-11 DIAGNOSIS — K222 Esophageal obstruction: Secondary | ICD-10-CM | POA: Diagnosis not present

## 2018-06-11 DIAGNOSIS — Z23 Encounter for immunization: Secondary | ICD-10-CM

## 2018-06-11 DIAGNOSIS — I10 Essential (primary) hypertension: Secondary | ICD-10-CM | POA: Diagnosis not present

## 2018-06-11 DIAGNOSIS — R1310 Dysphagia, unspecified: Secondary | ICD-10-CM | POA: Diagnosis not present

## 2018-06-11 DIAGNOSIS — I4891 Unspecified atrial fibrillation: Secondary | ICD-10-CM | POA: Diagnosis not present

## 2018-06-11 MED ORDER — CLOTRIMAZOLE-BETAMETHASONE 1-0.05 % EX CREA: TOPICAL_CREAM | CUTANEOUS | 6 refills | Status: DC

## 2018-06-11 MED ORDER — OMEPRAZOLE 20 MG PO CPDR: 20.0000 mg | DELAYED_RELEASE_CAPSULE | Freq: Every day | ORAL | 11 refills | Status: DC

## 2018-06-11 MED ORDER — APIXABAN 5 MG PO TABS: 5.0000 mg | ORAL_TABLET | Freq: Two times a day (BID) | ORAL | 5 refills | Status: DC

## 2018-06-11 MED ORDER — LEVOTHYROXINE SODIUM 50 MCG PO TABS: 50.0000 ug | ORAL_TABLET | Freq: Every day | ORAL | 6 refills | Status: DC

## 2018-06-11 MED ORDER — FAMOTIDINE 20 MG PO TABS: 20.0000 mg | ORAL_TABLET | Freq: Two times a day (BID) | ORAL | 3 refills | Status: DC

## 2018-06-11 MED ORDER — METOPROLOL SUCCINATE ER 100 MG PO TB24: ORAL_TABLET | ORAL | 1 refills | Status: DC

## 2018-06-11 MED ORDER — ESCITALOPRAM OXALATE 10 MG PO TABS: 10.0000 mg | ORAL_TABLET | Freq: Every day | ORAL | 1 refills | Status: DC

## 2018-06-11 MED ORDER — OLMESARTAN MEDOXOMIL 20 MG PO TABS: 20.0000 mg | ORAL_TABLET | Freq: Every day | ORAL | 2 refills | Status: DC

## 2018-06-11 NOTE — Progress Notes (Signed)
Subjective:  Patient ID: Crystal Browning,  female    DOB: 03-23-1934  Age: 84 y.o.    CC: Medical Management of Chronic Issues   HPI Crystal Browning presents for  follow-up of hypertension. Patient has no history of headache chest pain or shortness of breath or recent cough. Patient also denies symptoms of TIA such as numbness weakness lateralizing. Patient denies side effects from medication. States taking it regularly.  Patient also  in for follow-up of elevated cholesterol. Doing well without complaints on current medication. Denies side effects  including myalgia and arthralgia and nausea. Also in today for liver function testing. Currently no chest pain, shortness of breath or other cardiovascular related symptoms noted.  Patient presents for follow-up on  thyroid. The patient has a history of hypothyroidism for many years. It has been stable recently. Pt. denies any change in  voice, loss of hair, heat or cold intolerance. Energy level has been adequate to good. Patient denies constipation and diarrhea. No myxedema. Medication is as noted below. Verified that pt is taking it daily on an empty stomach. Well tolerated.  Patient's reflux and dysphagia symptoms have resolved with recent stretching of the esophagus via endoscopy.   Patient in for follow-up of atrial fibrillation. Patient denies any recent bouts of chest pain but she has occasional palpitations.  They are self-limited, uncommon, not associated with shortness of breath or chest pain.  Additionally, patient is taking anticoagulants. Patient denies any recent excessive bleeding episodes including epistaxis, bleeding from the gums, genitalia, rectal bleeding or hematuria. Additionally there has been no excessive bruising. History Crystal Browning has a past medical history of Bilateral lower extremity edema (10/30/2016), Cancer ([place]) (03/28/11), Depression, Diverticulosis (02/2005), Esophageal stricture (03/03/2018), Generalized abdominal pain  (07/17/2015), History of chemotherapy, Hypertension, Hypothyroid, Osteoporosis, Radiation, and TIA (transient ischemic attack).   She has a past surgical history that includes Direct laryngoscopy (03/27/2005); Tubal ligation; Total abdominal hysterectomy w/ bilateral salpingoophorectomy (1986); Cataract extraction, bilateral; and Cardioversion (N/A, 11/23/2016).   Her family history includes Alcohol abuse in her brother; COPD in her brother; Cancer (age of onset: 37) in her father; Heart disease (age of onset: 81) in her mother; Stroke in her mother.She reports that she has never smoked. She has never used smokeless tobacco. She reports that she does not drink alcohol or use drugs.  Current Outpatient Medications on File Prior to Visit  Medication Sig Dispense Refill  . B Complex-Folic Acid (B COMPLEX-VITAMIN B12 PO) Take by mouth.    . Blood Pressure Monitor KIT Use to monitor Blood Pressure at home. 1 each 0  . cetirizine (ZYRTEC) 10 MG tablet Take 1 tablet (10 mg total) by mouth daily. For itching 90 tablet 3  . polyethylene glycol powder (GLYCOLAX/MIRALAX) powder Take 17 g by mouth 2 (two) times daily as needed for moderate constipation. For constipation (Patient taking differently: Take 17 g by mouth 2 (two) times daily. For constipation) 3350 g 5  . triamcinolone cream (KENALOG) 0.1 % APPLY TO AFFECTED AREA TWICE A DAY 30 g 0   Current Facility-Administered Medications on File Prior to Visit  Medication Dose Route Frequency Provider Last Rate Last Dose  . 0.9 %  sodium chloride infusion  500 mL Intravenous Once Irene Shipper, MD        ROS Review of Systems  Constitutional: Negative.   HENT: Negative for congestion.   Eyes: Negative for visual disturbance.  Respiratory: Negative for shortness of breath.   Cardiovascular: Negative for  chest pain.  Gastrointestinal: Negative for abdominal pain, constipation, diarrhea, nausea and vomiting.  Genitourinary: Negative for difficulty urinating.    Musculoskeletal: Negative for arthralgias and myalgias.  Neurological: Negative for headaches.  Psychiatric/Behavioral: Negative for sleep disturbance.    Objective:  BP (!) 146/67   Pulse 76   Temp (!) 97.5 F (36.4 C)   Ht _0  (1.549 m)   Wt 132 lb 3.2 oz (60 kg)   BMI 24.98 kg/m   BP Readings from Last 3 Encounters:  06/11/18 (!) 146/67  03/19/18 (!) 155/70  03/03/18 (!) 144/76    Wt Readings from Last 3 Encounters:  06/11/18 132 lb 3.2 oz (60 kg)  03/19/18 134 lb (60.8 kg)  03/03/18 134 lb (60.8 kg)     Physical Exam  Constitutional: She is oriented to person, place, and time. She appears well-developed and well-nourished. No distress.  HENT:  Head: Normocephalic and atraumatic.  Right Ear: External ear normal.  Left Ear: External ear normal.  Nose: Nose normal.  Mouth/Throat: Oropharynx is clear and moist.  Eyes: Pupils are equal, round, and reactive to light. Conjunctivae and EOM are normal.  Neck: Normal range of motion. Neck supple. No thyromegaly present.  Cardiovascular: Normal rate, regular rhythm and normal heart sounds.  No murmur heard. Pulmonary/Chest: Effort normal and breath sounds normal. No respiratory distress. She has no wheezes. She has no rales.  Abdominal: Soft. Bowel sounds are normal. She exhibits no distension. There is no tenderness.  Lymphadenopathy:    She has no cervical adenopathy.  Neurological: She is alert and oriented to person, place, and time. She has normal reflexes.  Skin: Skin is warm and dry.  Psychiatric: She has a normal mood and affect. Her behavior is normal. Judgment and thought content normal.        Assessment & Plan:   Crystal Browning was seen today for medical management of chronic issues.  Diagnoses and all orders for this visit:  Current use of long term anticoagulation -     CBC with Differential/Platelet -     CMP14+EGFR -     Lipid panel -     TSH -     T4, Free  Essential hypertension -     CBC with  Differential/Platelet -     CMP14+EGFR -     Lipid panel -     TSH -     T4, Free  Atrial fibrillation, unspecified type (HCC) -     CBC with Differential/Platelet -     CMP14+EGFR -     Lipid panel -     TSH -     T4, Free  Dysphagia, unspecified type -     CBC with Differential/Platelet -     CMP14+EGFR -     Lipid panel -     TSH -     T4, Free  Hypothyroidism, unspecified type -     TSH  Esophageal stricture -     omeprazole (PRILOSEC) 20 MG capsule; Take 1 capsule (20 mg total) by mouth daily.  Encounter for immunization -     Flu vaccine HIGH DOSE PF  Other orders -     clotrimazole-betamethasone (LOTRISONE) cream; APPLY TO AFFECTED AREA TWICE A DAY -     apixaban (ELIQUIS) 5 MG TABS tablet; Take 1 tablet (5 mg total) by mouth 2 (two) times daily. -     escitalopram (LEXAPRO) 10 MG tablet; Take 1 tablet (10 mg total) by mouth daily. -  famotidine (PEPCID) 20 MG tablet; Take 1 tablet (20 mg total) by mouth every 12 (twelve) hours. -     levothyroxine (SYNTHROID, LEVOTHROID) 50 MCG tablet; Take 1 tablet (50 mcg total) by mouth daily. -     metoprolol succinate (TOPROL-XL) 100 MG 24 hr tablet; TAKE 1 TABLET BY MOUTH DAILY. TAKE WITH OR IMMEDIATELY FOLLOWING A MEAL. -     olmesartan (BENICAR) 20 MG tablet; Take 1 tablet (20 mg total) by mouth daily.   I have discontinued Graciela B. Cohoon's ranitidine. I have also changed her ELIQUIS to apixaban. Additionally, [name] maintain her polyethylene glycol powder, B Complex-Folic Acid (B COMPLEX-VITAMIN B12 PO), Blood Pressure Monitor, triamcinolone cream, cetirizine, clotrimazole-betamethasone, escitalopram, famotidine, levothyroxine, metoprolol succinate, olmesartan, and omeprazole. We will continue to administer sodium chloride.  Meds ordered this encounter  Medications  . clotrimazole-betamethasone (LOTRISONE) cream    Sig: APPLY TO AFFECTED AREA TWICE A DAY    Dispense:  30 g    Refill:  6  . apixaban (ELIQUIS)  5 MG TABS tablet    Sig: Take 1 tablet (5 mg total) by mouth 2 (two) times daily.    Dispense:  60 tablet    Refill:  5  . escitalopram (LEXAPRO) 10 MG tablet    Sig: Take 1 tablet (10 mg total) by mouth daily.    Dispense:  90 tablet    Refill:  1  . famotidine (PEPCID) 20 MG tablet    Sig: Take 1 tablet (20 mg total) by mouth every 12 (twelve) hours.    Dispense:  60 tablet    Refill:  3  . levothyroxine (SYNTHROID, LEVOTHROID) 50 MCG tablet    Sig: Take 1 tablet (50 mcg total) by mouth daily.    Dispense:  30 tablet    Refill:  6  . metoprolol succinate (TOPROL-XL) 100 MG 24 hr tablet    Sig: TAKE 1 TABLET BY MOUTH DAILY. TAKE WITH OR IMMEDIATELY FOLLOWING A MEAL.    Dispense:  90 tablet    Refill:  1  . olmesartan (BENICAR) 20 MG tablet    Sig: Take 1 tablet (20 mg total) by mouth daily.    Dispense:  30 tablet    Refill:  2  . omeprazole (PRILOSEC) 20 MG capsule    Sig: Take 1 capsule (20 mg total) by mouth daily.    Dispense:  30 capsule    Refill:  11     Follow-up: Return in about 3 months (around 09/11/2018).  [name], M.D.

## 2018-06-12 LAB — CMP14+EGFR
A/G RATIO: 1.7 (ref 1.2–2.2)
ALBUMIN: 4.2 g/dL (ref 3.5–4.7)
ALK PHOS: 71 IU/L (ref 39–117)
ALT: 19 IU/L (ref 0–32)
AST: 28 IU/L (ref 0–40)
BILIRUBIN TOTAL: 0.4 mg/dL (ref 0.0–1.2)
BUN / CREAT RATIO: 11 — AB (ref 12–28)
BUN: 10 mg/dL (ref 8–27)
CO2: 25 mmol/L (ref 20–29)
Calcium: 9.6 mg/dL (ref 8.7–10.3)
Chloride: 102 mmol/L (ref 96–106)
Creatinine, Ser: 0.91 mg/dL (ref 0.57–1.00)
GFR calc Af Amer: 68 mL/min/{1.73_m2} (ref >59)
GFR calc non Af Amer: 59 mL/min/{1.73_m2} — ABNORMAL LOW (ref >59)
Globulin, Total: 2.5 g/dL (ref 1.5–4.5)
Glucose: 94 mg/dL (ref 65–99)
POTASSIUM: 4.6 mmol/L (ref 3.5–5.2)
Sodium: 140 mmol/L (ref 134–144)
Total Protein: 6.7 g/dL (ref 6.0–8.5)

## 2018-06-12 LAB — CBC WITH DIFFERENTIAL/PLATELET
Basophils Absolute: 0 10*3/uL (ref 0.0–0.2)
Basos: 0 %
EOS (ABSOLUTE): 0.2 10*3/uL (ref 0.0–0.4)
EOS: 4 %
HEMATOCRIT: 36.7 % (ref 34.0–46.6)
Hemoglobin: 12.3 g/dL (ref 11.1–15.9)
Immature Grans (Abs): 0 10*3/uL (ref 0.0–0.1)
Immature Granulocytes: 0 %
LYMPHS ABS: 1 10*3/uL (ref 0.7–3.1)
Lymphs: 23 %
MCH: 31.2 pg (ref 26.6–33.0)
MCHC: 33.5 g/dL (ref 31.5–35.7)
MCV: 93 fL (ref 79–97)
MONOS ABS: 0.5 10*3/uL (ref 0.1–0.9)
Monocytes: 10 %
NEUTROS ABS: 2.7 10*3/uL (ref 1.4–7.0)
Neutrophils: 63 %
Platelets: 263 10*3/uL (ref 150–450)
RBC: 3.94 x10E6/uL (ref 3.77–5.28)
RDW: 12.6 % (ref 12.3–15.4)
WBC: 4.4 10*3/uL (ref 3.4–10.8)

## 2018-06-12 LAB — LIPID PANEL
Chol/HDL Ratio: 4 ratio (ref 0.0–4.4)
Cholesterol, Total: 166 mg/dL (ref 100–199)
HDL: 41 mg/dL (ref >39)
LDL CALC: 97 mg/dL (ref 0–99)
Triglycerides: 141 mg/dL (ref 0–149)
VLDL CHOLESTEROL CAL: 28 mg/dL (ref 5–40)

## 2018-06-12 LAB — T4, FREE: FREE T4: 1.38 ng/dL (ref 0.82–1.77)

## 2018-06-12 LAB — TSH: TSH: 4.24 u[IU]/mL (ref 0.450–4.500)

## 2018-06-13 NOTE — Progress Notes (Signed)
Hello Ayleah,  Your lab result is normal.Some minor variations that are not significant are commonly marked abnormal, but do not represent any medical problem for you.  Best regards, [name], M.D.

## 2018-06-24 ENCOUNTER — Ambulatory Visit (INDEPENDENT_AMBULATORY_CARE_PROVIDER_SITE_OTHER): Payer: Medicare Other | Admitting: Internal Medicine

## 2018-06-24 ENCOUNTER — Encounter: Payer: Self-pay | Admitting: Internal Medicine

## 2018-06-24 VITALS — BP 180/84 | HR 78 | Ht 61.0 in | Wt 130.6 lb

## 2018-06-24 DIAGNOSIS — I10 Essential (primary) hypertension: Secondary | ICD-10-CM

## 2018-06-24 DIAGNOSIS — I872 Venous insufficiency (chronic) (peripheral): Secondary | ICD-10-CM | POA: Diagnosis not present

## 2018-06-24 DIAGNOSIS — I4891 Unspecified atrial fibrillation: Secondary | ICD-10-CM

## 2018-06-24 NOTE — Progress Notes (Signed)
OFFICE CONSULT NOTE  Chief Complaint:  No complaints  Primary Care Physician: [name], MD  HPI:  Crystal Browning is a 84 y.o. female who is being seen today for the evaluation of atrial fibrillation at the request of [name], MD. [name] was recently seen for symptoms of palpitations and shortness of breath which was mostly noted by her daughter. She said that they routinely go shopping at [facility] and that while walking through the building she became more short of breath easier. It does not seem that Crystal Browning typically complaints about any worsening shortness of breath or symptoms but it was clear to her daughter that she was struggling somewhat to get around. She noted that her pulse was irregular and when she was seen in her primary care doctor's office she was found to be in atrial fibrillation. There is no history of A. fib in the family or personal history although she did have possibly a mild MI after the death of her husband, which sounds like it could've been a stress-induced cardiomyopathic event. She was a ready on metoprolol succinate 50 mg daily and that was increased up to 100 mg daily for rate control and started on Xarelto. Unfortunate she was given the Xarelto DBT dose pack which is 50 mg twice a day and then switching to 20 mg daily. The appropriate dose for her would've been 20 mg daily from the beginning. So far she has not had any bleeding problems. She is also on low-dose aspirin. There is apparently history of diverticulum.  12/14/2016  Mrs. Crystal Browning returns today for follow-up of her cardioversion. This is performed by myself on 11/23/2016. She was cardioverted once at 150 J successfully to sinus rhythm. Per her daughter's report she had a partial choking event which was related to x-ray therapy in the past 2 days after her cardioversion. EMS was called and she was noted to be in sinus rhythm at the time. She says she's felt very good since then. She continues to feel  well today. Unfortunately, her EKG today shows she is back in atrial fibrillation although rate controlled in the 70s. Based on this finding I think that she is actually somewhat asymptomatic with the A. fib and therefore I would not recommend pursuing a rhythm control strategy in her. We also discussed anticoagulation. Her daughter's concerned about possibly higher bleeding risk on Xarelto and is interested in switching her to Eliquis.  06/14/2017  Crystal Browning returns today for follow-up.  She is done well without any recurrent A. fib.  She occasionally gets some dizziness but I do not feel that this is the A. fib.  EKG shows sinus rhythm today.  She denies any bleeding problems on Eliquis.  Recently labs were done in August by her primary care provider which showed normal renal function and stable hemoglobin and hematocrit.  Blood pressure is at goal today.  06/24/2018  Crystal Browning is seen today in follow-up.  She seems to be back in A. fib today.  Apparently recently she was seen in her PCPs office and was in sinus rhythm.  This suggests her A. fib is paroxysmal.  She is tolerating Eliquis without any bleeding problems.  Her blood pressure is noted to be high today at 180/84 however recheck came down to 166 systolic.  Her PCP has noted labile blood pressures in the past and apparently she was placed on some additional blood pressure medicine which "bottomed her out" according to her daughter.  Labs from June 11, 2018 showed total cholesterol 166, HDL 41, LDL 97 and triglycerides 141.  PMHx:  Past Medical History:  Diagnosis Date  . Bilateral lower extremity edema 10/30/2016  . 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 Depression    History of Depression- Lexapro therapy  . Diverticulosis 02/2005   History of diverticulosis with admission from 8/18 - 03/04/2005  . Esophageal stricture 03/03/2018  . Generalized  abdominal pain 07/17/2015  . History of chemotherapy    S/P chemotherapy with Dr. Sonny Dandy -2006  . Hypertension   . Hypothyroid   . Osteoporosis    History of Osteoporosis with one year of Actonel only  . Radiation    S/P radiation thrapy -06/2005  . TIA (transient ischemic attack)    History of TIA's with no residual effect    Past Surgical History:  Procedure Laterality Date  . CARDIOVERSION N/A 11/23/2016   Procedure: CARDIOVERSION;  Surgeon: Pixie Casino, MD;  Location: [facility] ENDOSCOPY;  Service: Cardiovascular;  Laterality: N/A;  . CATARACT EXTRACTION, BILATERAL    . DIRECT LARYNGOSCOPY  03/27/2005   S/P direct laryngoscopy, esophagoscopy and biopsy with Dr. Constance Holster  . TOTAL ABDOMINAL HYSTERECTOMY W/ BILATERAL SALPINGOOPHORECTOMY  1986  . TUBAL LIGATION      FAMHx:  Family History  Problem Relation Age of Onset  . Stroke Mother   . Heart disease Mother 11  . Cancer Father 9       Prostate  . Alcohol abuse Brother   . COPD Brother     SOCHx:   reports that she has never smoked. She has never used smokeless tobacco. She reports that she does not drink alcohol or use drugs.  ALLERGIES:  Allergies  Allergen Reactions  . Tuberculin Tests Swelling  . Vit D-Vit E-Safflower Oil   . Paroxetine Hcl Rash    ROS: Pertinent items noted in HPI and remainder of comprehensive ROS otherwise negative.  HOME MEDS: Current Outpatient Medications on File Prior to Visit  Medication Sig Dispense Refill  . apixaban (ELIQUIS) 5 MG TABS tablet Take 1 tablet (5 mg total) by mouth 2 (two) times daily. 60 tablet 5  . B Complex-Folic Acid (B COMPLEX-VITAMIN B12 PO) Take by mouth.    . Blood Pressure Monitor KIT Use to monitor Blood Pressure at home. 1 each 0  . clotrimazole-betamethasone (LOTRISONE) cream APPLY TO AFFECTED AREA TWICE A DAY 30 g 6  . escitalopram (LEXAPRO) 10 MG tablet Take 1 tablet (10 mg total) by mouth daily. 90 tablet 1  . famotidine (PEPCID) 20 MG tablet Take 1 tablet  (20 mg total) by mouth every 12 (twelve) hours. 60 tablet 3  . levothyroxine (SYNTHROID, LEVOTHROID) 50 MCG tablet Take 1 tablet (50 mcg total) by mouth daily. 30 tablet 6  . metoprolol succinate (TOPROL-XL) 100 MG 24 hr tablet TAKE 1 TABLET BY MOUTH DAILY. TAKE WITH OR IMMEDIATELY FOLLOWING A MEAL. 90 tablet 1  . polyethylene glycol powder (GLYCOLAX/MIRALAX) powder Take 17 g by mouth 2 (two) times daily as needed for moderate constipation. For constipation (Patient taking differently: Take 17 g by mouth 2 (two) times daily. For constipation) 3350 g 5  . triamcinolone cream (KENALOG) 0.1 % APPLY TO AFFECTED AREA TWICE A DAY 30 g 0   Current Facility-Administered Medications on File Prior to Visit  Medication Dose Route Frequency Provider Last Rate Last Dose  . 0.9 %  sodium chloride infusion  500 mL Intravenous Once Irene Shipper, MD        LABS/IMAGING: No results found for this or any previous visit (from the past 48 hour(s)). No results found.  WEIGHTS: Wt Readings from Last 3 Encounters:  06/24/18 130 lb 9.6 oz (59.2 kg)  06/11/18 132 lb 3.2 oz (60 kg)  03/19/18 134 lb (60.8 kg)    VITALS: BP (!) 180/84   Pulse 78   Ht '5\' 1"'  (1.549 m)   Wt 130 lb 9.6 oz (59.2 kg)   BMI 24.68 kg/m   EXAM: General appearance: alert and no distress Neck: no carotid bruit and no JVD Lungs: clear to auscultation bilaterally Heart: regular rate and rhythm, S1, S2 normal, no murmur, click, rub or gallop Abdomen: soft, non-tender; bowel sounds normal; no masses,  no organomegaly Extremities: extremities normal, atraumatic, no cyanosis or edema and varicose veins noted Pulses: 2+ and symmetric Skin: Skin color, texture, turgor normal. No rashes or lesions Neurologic: Grossly normal Psych: Pleasant  EKG: Atrial fibrillation with controlled ventricular response at 78-personally reviewed  ASSESSMENT: 1. Paroxysmal atrial fibrillation-successful DCCV, however there was recurrence 2. CHADSVASC  score of 4 3. Hypertension 4. Venous insufficiency  PLAN: 1.   Mrs. Lohn remains asymptomatic with her A. fib.  She has been in and out of rhythm and most recently was in rhythm when she saw her PCP.  Today she is in A. fib which is rate controlled.  Blood pressure is elevated today however came down with a recheck to around 712 systolic.  This is what her blood pressure was at her most recent office visit.  I encouraged monitoring this.  Because of labile blood pressures in the past apparently we have not been as aggressive about getting her down around 120/80.  Follow-up annually or sooner as necessary.  Pixie Casino, MD, [facility], [place] Director of the Advanced Lipid Disorders &  Cardiovascular Risk Reduction Clinic Attending Cardiologist  Direct Dial: [phone]  Fax: [phone]  Website:  [email or web address] 06/24/2018, 9:35 AM

## 2018-06-24 NOTE — Patient Instructions (Signed)

## 2018-06-30 ENCOUNTER — Telehealth (HOSPITAL_COMMUNITY): Payer: Self-pay

## 2018-06-30 NOTE — Telephone Encounter (Signed)
Called patients daughter on 06-27-18 and left message on machine for her to call back to Dental Medicine to reschedule her mothers appt on 07-07-18 due to [place] being closed for the Holiday. Molli Posey

## 2018-07-07 ENCOUNTER — Encounter (HOSPITAL_COMMUNITY): Payer: Self-pay | Admitting: Dentistry

## 2018-07-24 ENCOUNTER — Telehealth (HOSPITAL_COMMUNITY): Payer: Self-pay

## 2018-07-24 NOTE — Telephone Encounter (Signed)
Called and left message on machine for patients daughter to return call to Dental Medicine to reschedule Mrs. Whites appointment for 07-30-18 at 1:00 pm. Molli Posey

## 2018-07-25 DIAGNOSIS — Z8521 Personal history of malignant neoplasm of larynx: Secondary | ICD-10-CM | POA: Diagnosis not present

## 2018-07-30 ENCOUNTER — Encounter (HOSPITAL_COMMUNITY): Payer: Self-pay | Admitting: Dentistry

## 2018-08-05 ENCOUNTER — Ambulatory Visit (HOSPITAL_COMMUNITY): Payer: Medicaid - Dental | Admitting: Dentistry

## 2018-08-05 ENCOUNTER — Encounter (HOSPITAL_COMMUNITY): Payer: Self-pay | Admitting: Dentistry

## 2018-08-05 VITALS — BP 157/93 | HR 61 | Temp 97.8°F

## 2018-08-05 DIAGNOSIS — K0820 Unspecified atrophy of edentulous alveolar ridge: Secondary | ICD-10-CM

## 2018-08-05 DIAGNOSIS — M264 Malocclusion, unspecified: Secondary | ICD-10-CM

## 2018-08-05 DIAGNOSIS — Z972 Presence of dental prosthetic device (complete) (partial): Secondary | ICD-10-CM

## 2018-08-05 DIAGNOSIS — K117 Disturbances of salivary secretion: Secondary | ICD-10-CM

## 2018-08-05 DIAGNOSIS — K08109 Complete loss of teeth, unspecified cause, unspecified class: Secondary | ICD-10-CM

## 2018-08-05 DIAGNOSIS — Z463 Encounter for fitting and adjustment of dental prosthetic device: Secondary | ICD-10-CM

## 2018-08-05 DIAGNOSIS — R682 Dry mouth, unspecified: Secondary | ICD-10-CM

## 2018-08-05 NOTE — Patient Instructions (Addendum)
PLAN: 1.  I discussed the risks, benefits, complications of proceeding with fabrication of a new upper and lower complete denture by a general dentist in [place], [place] or a Public librarian in [place], [place].  Patient is a difficult case to fabricate dentures for and ideally would follow-up with a prosthodontist for fabrication of new upper and lower dentures.  We also discussed implant therapy with the prosthodontist as well. Patient and daughter currently do not wish to proceed with referral to a prosthodontist or general dentist in [place], [place] for fabrication of new dentures. They wish to proceed with yearly evaluations or to call as needed. 2.  RTC for denture recall in one year. 3. Patient to call is sore spots arise. Patient to use salt water rinses as needed to aid healing.    Ruby Cola

## 2018-08-05 NOTE — Progress Notes (Signed)
08/05/18  Patient:            Crystal Browning Date of Birth:  June 01, 1934 MRN:                [id number]  BP (!) 157/93 (BP Location: Left Arm)   Pulse 61   Temp 97.8 F (36.6 C)    Crystal Browning presents for periodic oral exam and evaluation of upper and lower complete dentures. Patient with history of atrial fibrillation and cardioversion by Dr. Debara Browning earlier this year. Patient recently saw Dr. Debara Browning on 06/24/2018 with plan to maintain patient on Eliquis therapy with no current plan for future cardioversion attempts. Patient also saw Dr. 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  Patient Active Problem List   Diagnosis Date Noted  . Current use of long term anticoagulation 12/14/2016  . Atrial fibrillation ([place]) 10/30/2016  . Other fatigue 10/30/2016  . Venous insufficiency of both lower extremities 10/30/2016  . Postmenopausal 07/17/2015  . Thyroid activity decreased 07/17/2015  . Essential hypertension 07/12/2015  . Angina pectoris ([place]) 07/12/2015  . Vitamin D deficiency 07/12/2015    Medical History.  Past Medical History:  Diagnosis Date  . Bilateral lower extremity edema 10/30/2016  . Cancer ([place]) 03/28/11   SCCA of the Supraglottic Larynx ([place])    ; S/P Chemoradiation therapy from 05/09/05 thru 06/29/05  . Depression    History of Depression- Lexapro therapy  . Diverticulosis 02/2005   History of diverticulosis with admission from 8/18 - 03/04/2005  . Esophageal stricture 03/03/2018  . Generalized abdominal pain 07/17/2015  . History of chemotherapy    S/P chemotherapy with Dr. Sonny Dandy -2006  . Hypertension   . Hypothyroid   . Osteoporosis    History of Osteoporosis with one year of Actonel only  . Radiation    S/P radiation thrapy -06/2005  . TIA (transient ischemic attack)    History of TIA's with no residual effect    Allergies  Allergen Reactions  . Tuberculin Tests Swelling  . Vit D-Vit  E-Safflower Oil   . Paroxetine Hcl Rash   Current Outpatient Medications  Medication Sig Dispense Refill  . apixaban (ELIQUIS) 5 MG TABS tablet Take 1 tablet (5 mg total) by mouth 2 (two) times daily. 60 tablet 5  . B Complex-Folic Acid (B COMPLEX-VITAMIN B12 PO) Take by mouth.    . Blood Pressure Monitor KIT Use to monitor Blood Pressure at home. 1 each 0  . clotrimazole-betamethasone (LOTRISONE) cream APPLY TO AFFECTED AREA TWICE A DAY 30 g 6  . escitalopram (LEXAPRO) 10 MG tablet Take 1 tablet (10 mg total) by mouth daily. 90 tablet 1  . famotidine (PEPCID) 20 MG tablet Take 1 tablet (20 mg total) by mouth every 12 (twelve) hours. 60 tablet 3  . levothyroxine (SYNTHROID, LEVOTHROID) 50 MCG tablet Take 1 tablet (50 mcg total) by mouth daily. 30 tablet 6  . metoprolol succinate (TOPROL-XL) 100 MG 24 hr tablet TAKE 1 TABLET BY MOUTH DAILY. TAKE WITH OR IMMEDIATELY FOLLOWING A MEAL. 90 tablet 1  . polyethylene glycol powder (GLYCOLAX/MIRALAX) powder Take 17 g by mouth 2 (two) times daily as needed for moderate constipation. For constipation (Patient taking differently: Take 17 g by mouth 2 (two) times daily. For constipation) 3350 g 5  . triamcinolone cream (KENALOG) 0.1 % APPLY TO AFFECTED AREA TWICE A DAY 30 g 0   Current Facility-Administered Medications  Medication Dose Route Frequency  Provider Last Rate Last Dose  . 0.9 %  sodium chloride infusion  500 mL Intravenous Once Irene Shipper, MD       CC: Patient without complaints from dentures.   Exam: General: Patient is a well-developed, slightly built female in no acute distress. Head and neck: No lymphadenopathy. No acute TMJ symptoms. Intraoral exam: Edentulous . Mild xerostomia.  No denture irritation or erythema noted. 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 Prosthodontics: Upper and lower dentures are stable with less than ideal retention consistent with atrophy of alveolar ridges. Patient had upper and lower complete  denture relines inserted on 10/21/2012. Initial dentures were fabricated and inserted in 2007. Procedure: Pressure indicting paste applied to dentures and adjustments made as needed. [place] Occlusion adjusted as needed. [place] Right posterior crossbite as before. Patient with tendency to protrude lower jaw with evaluation of occlusion. Patient was again instructed on finding maximum intercuspation position. Less than ideal occlusion, but patient accepts results and is able to chew food without problems.  PLAN: 1.  I discussed the risks, benefits, complications of proceeding with fabrication of a new upper and lower complete denture by a general dentist in [place], [place] or a Public librarian in [place], [place].  Patient is a difficult case to fabricate dentures for and ideally would follow-up with a prosthodontist for fabrication of new upper and lower dentures.  We also discussed implant therapy with the prosthodontist as well. Patient and daughter currently do not wish to proceed with referral to a prosthodontist or general dentist in [place], [place] for fabrication of new dentures. They wish to proceed with yearly evaluations or to call as needed. 2.  RTC for denture recall in one year. 3. Patient to call is sore spots arise. Patient to use salt water rinses as needed to aid healing.    Ruby Cola

## 2018-09-12 ENCOUNTER — Ambulatory Visit: Payer: Self-pay | Admitting: Family Medicine

## 2018-09-15 ENCOUNTER — Ambulatory Visit (INDEPENDENT_AMBULATORY_CARE_PROVIDER_SITE_OTHER): Payer: Medicare Other | Admitting: Family Medicine

## 2018-09-15 ENCOUNTER — Encounter: Payer: Self-pay | Admitting: Family Medicine

## 2018-09-15 VITALS — BP 199/87 | HR 68 | Temp 97.2°F | Ht 61.0 in | Wt 131.5 lb

## 2018-09-15 DIAGNOSIS — I4891 Unspecified atrial fibrillation: Secondary | ICD-10-CM

## 2018-09-15 DIAGNOSIS — E039 Hypothyroidism, unspecified: Secondary | ICD-10-CM | POA: Diagnosis not present

## 2018-09-15 DIAGNOSIS — I10 Essential (primary) hypertension: Secondary | ICD-10-CM | POA: Diagnosis not present

## 2018-09-15 DIAGNOSIS — Z7901 Long term (current) use of anticoagulants: Secondary | ICD-10-CM | POA: Diagnosis not present

## 2018-09-15 NOTE — Progress Notes (Signed)
Subjective:  Patient ID: Crystal Browning, female    DOB: [birth year]  Age: 85 y.o. MRN: [id number]  CC: Medical Management of Chronic Issues   HPI Crystal Browning presents for patient presents for follow-up on  thyroid. The patient has a history of hypothyroidism for many years. It has been stable recently. Pt. denies any change in  voice, loss of hair, heat or cold intolerance. Energy level has been adequate to good. Patient denies constipation and diarrhea. No myxedema. Medication is as noted below. Verified that pt is taking it daily on an empty stomach. Well tolerated.  Follow-up of hypertension. Patient has no history of headache chest pain or shortness of breath or recent cough. Patient also denies symptoms of TIA such as numbness weakness lateralizing. Patient checks  blood pressure at home and has not had any elevated readings recently. Feels that she doesn't need to treat elevated BP today. Didn't take verapamil. WEnt back to metformin. Patient in for follow-up of atrial fibrillation. Patient denies any recent bouts of chest pain or palpitations. Additionally, patient is taking anticoagulants. Patient denies any recent excessive bleeding episodes including epistaxis, bleeding from the gums, genitalia, rectal bleeding or hematuria. Additionally there has been no excessive bruising.  Depression screen [facility] 2/9 09/15/2018 06/11/2018 02/28/2018  Decreased Interest 0 2 0  Down, Depressed, Hopeless 0 0 0  PHQ - 2 Score 0 2 0  Altered sleeping - 0 -  Tired, decreased energy - 0 -  Change in appetite - 1 -  Feeling bad or failure about yourself  - 0 -  Trouble concentrating - 3 -  Moving slowly or fidgety/restless - 0 -  Suicidal thoughts - 0 -  PHQ-9 Score - 6 -  Difficult doing work/chores - - -    History Crystal Browning has a past medical history of Bilateral lower extremity edema (10/30/2016), Cancer ([place]) (03/28/11), Depression, Diverticulosis (02/2005), Esophageal stricture (03/03/2018), Generalized  abdominal pain (07/17/2015), History of chemotherapy, Hypertension, Hypothyroid, Osteoporosis, Radiation, and TIA (transient ischemic attack).   She has a past surgical history that includes Direct laryngoscopy (03/27/2005); Tubal ligation; Total abdominal hysterectomy w/ bilateral salpingoophorectomy (1986); Cataract extraction, bilateral; and Cardioversion (N/A, 11/23/2016).   Her family history includes Alcohol abuse in her brother; COPD in her brother; Cancer (age of onset: 71) in her father; Heart disease (age of onset: 11) in her mother; Stroke in her mother.She reports that she has never smoked. She has never used smokeless tobacco. She reports that she does not drink alcohol or use drugs.    ROS Review of Systems  Constitutional: Negative.   HENT: Negative for congestion.   Eyes: Negative for visual disturbance.  Respiratory: Negative for shortness of breath.   Cardiovascular: Negative for chest pain.  Gastrointestinal: Negative for abdominal pain, constipation, diarrhea, nausea and vomiting.  Genitourinary: Negative for difficulty urinating.  Musculoskeletal: Negative for arthralgias and myalgias.  Neurological: Negative for headaches.  Psychiatric/Behavioral: Negative for sleep disturbance.    Objective:  BP (!) 199/87   Pulse 68   Temp (!) 97.2 F (36.2 C) (Oral)   Ht '5\' 1"'  (1.549 m)   Wt 131 lb 8 oz (59.6 kg)   BMI 24.85 kg/m   BP Readings from Last 3 Encounters:  09/15/18 (!) 199/87  08/05/18 (!) 157/93  06/24/18 (!) 180/84    Wt Readings from Last 3 Encounters:  09/15/18 131 lb 8 oz (59.6 kg)  06/24/18 130 lb 9.6 oz (59.2 kg)  06/11/18 132 lb 3.2 oz (  60 kg)     Physical Exam Constitutional:      General: She is not in acute distress.    Appearance: She is well-developed.  HENT:     Head: Normocephalic and atraumatic.     Right Ear: External ear normal.     Left Ear: External ear normal.     Nose: Nose normal.  Eyes:     Conjunctiva/sclera: Conjunctivae  normal.     Pupils: Pupils are equal, round, and reactive to light.  Neck:     Musculoskeletal: Normal range of motion and neck supple.     Thyroid: No thyromegaly.  Cardiovascular:     Rate and Rhythm: Normal rate and regular rhythm.     Heart sounds: Normal heart sounds. No murmur.  Pulmonary:     Effort: Pulmonary effort is normal. No respiratory distress.     Breath sounds: Normal breath sounds. No wheezing or rales.  Abdominal:     General: Bowel sounds are normal. There is no distension.     Palpations: Abdomen is soft.     Tenderness: There is no abdominal tenderness.  Lymphadenopathy:     Cervical: No cervical adenopathy.  Skin:    General: Skin is warm and dry.  Neurological:     Mental Status: She is alert and oriented to person, place, and time.     Deep Tendon Reflexes: Reflexes are normal and symmetric.  Psychiatric:        Behavior: Behavior normal.        Thought Content: Thought content normal.        Judgment: Judgment normal.       Assessment & Plan:   Crystal Browning was seen today for medical management of chronic issues.  Diagnoses and all orders for this visit:  Hypothyroidism, unspecified type -     CBC with Differential/Platelet -     CMP14+EGFR -     TSH -     T4, Free  Essential hypertension  Atrial fibrillation, unspecified type ([employer])  Long term (current) use of anticoagulants       I have discontinued Crystal Browning's Blood Pressure Monitor. I am also having her maintain her polyethylene glycol powder, B Complex-Folic Acid (B COMPLEX-VITAMIN B12 PO), triamcinolone cream, clotrimazole-betamethasone, apixaban, escitalopram, famotidine, levothyroxine, and metoprolol succinate. We will continue to administer sodium chloride.  Allergies as of 09/15/2018      Reactions   Tuberculin Tests Swelling   Vit D-vit E-safflower Oil    Paroxetine Hcl Rash      Medication List       Accurate as of September 15, 2018  9:13 PM. Always use your most recent  med list.        apixaban 5 MG Tabs tablet Commonly known as:  ELIQUIS Take 1 tablet (5 mg total) by mouth 2 (two) times daily.   B COMPLEX-VITAMIN B12 PO Take by mouth.   clotrimazole-betamethasone cream Commonly known as:  LOTRISONE APPLY TO AFFECTED AREA TWICE A DAY   escitalopram 10 MG tablet Commonly known as:  LEXAPRO Take 1 tablet (10 mg total) by mouth daily.   famotidine 20 MG tablet Commonly known as:  PEPCID Take 1 tablet (20 mg total) by mouth every 12 (twelve) hours.   levothyroxine 50 MCG tablet Commonly known as:  SYNTHROID, LEVOTHROID Take 1 tablet (50 mcg total) by mouth daily.   metoprolol succinate 100 MG 24 hr tablet Commonly known as:  TOPROL-XL TAKE 1 TABLET BY MOUTH DAILY. TAKE WITH  OR IMMEDIATELY FOLLOWING A MEAL.   polyethylene glycol powder powder Commonly known as:  GLYCOLAX/MIRALAX Take 17 g by mouth 2 (two) times daily as needed for moderate constipation. For constipation   triamcinolone cream 0.1 % Commonly known as:  KENALOG APPLY TO AFFECTED AREA TWICE A DAY        Follow-up: Return in about 6 months (around 03/18/2019).  [name], M.D.

## 2018-09-16 LAB — CMP14+EGFR
A/G RATIO: 1.7 (ref 1.2–2.2)
ALT: 24 IU/L (ref 0–32)
AST: 34 IU/L (ref 0–40)
Albumin: 4.3 g/dL (ref 3.6–4.6)
Alkaline Phosphatase: 76 IU/L (ref 39–117)
BUN/Creatinine Ratio: 9 — ABNORMAL LOW (ref 12–28)
BUN: 8 mg/dL (ref 8–27)
Bilirubin Total: 0.6 mg/dL (ref 0.0–1.2)
CO2: 24 mmol/L (ref 20–29)
Calcium: 9.3 mg/dL (ref 8.7–10.3)
Chloride: 106 mmol/L (ref 96–106)
Creatinine, Ser: 0.85 mg/dL (ref 0.57–1.00)
GFR calc Af Amer: 74 mL/min/{1.73_m2} (ref >59)
GFR, EST NON AFRICAN AMERICAN: 64 mL/min/{1.73_m2} (ref >59)
GLOBULIN, TOTAL: 2.6 g/dL (ref 1.5–4.5)
Glucose: 98 mg/dL (ref 65–99)
POTASSIUM: 4.1 mmol/L (ref 3.5–5.2)
Sodium: 143 mmol/L (ref 134–144)
Total Protein: 6.9 g/dL (ref 6.0–8.5)

## 2018-09-16 LAB — CBC WITH DIFFERENTIAL/PLATELET
BASOS: 0 %
Basophils Absolute: 0 10*3/uL (ref 0.0–0.2)
EOS (ABSOLUTE): 0.1 10*3/uL (ref 0.0–0.4)
Eos: 2 %
HEMATOCRIT: 39.4 % (ref 34.0–46.6)
Hemoglobin: 12.8 g/dL (ref 11.1–15.9)
Immature Grans (Abs): 0 10*3/uL (ref 0.0–0.1)
Immature Granulocytes: 0 %
Lymphocytes Absolute: 1.2 10*3/uL (ref 0.7–3.1)
Lymphs: 25 %
MCH: 30.2 pg (ref 26.6–33.0)
MCHC: 32.5 g/dL (ref 31.5–35.7)
MCV: 93 fL (ref 79–97)
MONOS ABS: 0.5 10*3/uL (ref 0.1–0.9)
Monocytes: 10 %
Neutrophils Absolute: 3 10*3/uL (ref 1.4–7.0)
Neutrophils: 63 %
Platelets: 210 10*3/uL (ref 150–450)
RBC: 4.24 x10E6/uL (ref 3.77–5.28)
RDW: 13.6 % (ref 11.7–15.4)
WBC: 4.8 10*3/uL (ref 3.4–10.8)

## 2018-09-16 LAB — TSH: TSH: 3.92 u[IU]/mL (ref 0.450–4.500)

## 2018-09-16 LAB — T4, FREE: Free T4: 1.4 ng/dL (ref 0.82–1.77)

## 2018-09-16 NOTE — Progress Notes (Signed)
Hello Arra,  Your lab result is normal.Some minor variations that are not significant are commonly marked abnormal, but do not represent any medical problem for you.  Best regards, [name], M.D.

## 2018-10-16 ENCOUNTER — Other Ambulatory Visit: Payer: Self-pay

## 2018-10-17 ENCOUNTER — Encounter: Payer: Self-pay | Admitting: Family Medicine

## 2018-10-17 ENCOUNTER — Other Ambulatory Visit: Payer: Self-pay

## 2018-10-17 ENCOUNTER — Ambulatory Visit (INDEPENDENT_AMBULATORY_CARE_PROVIDER_SITE_OTHER): Payer: Medicare Other | Admitting: Family Medicine

## 2018-10-17 VITALS — BP 156/82 | HR 75 | Temp 96.9°F | Ht 61.0 in | Wt 127.0 lb

## 2018-10-17 DIAGNOSIS — I10 Essential (primary) hypertension: Secondary | ICD-10-CM

## 2018-10-17 DIAGNOSIS — I4891 Unspecified atrial fibrillation: Secondary | ICD-10-CM

## 2018-10-17 MED ORDER — AMLODIPINE BESYLATE 2.5 MG PO TABS: 2.5000 mg | ORAL_TABLET | Freq: Every day | ORAL | 2 refills | Status: DC

## 2018-10-17 NOTE — Progress Notes (Signed)
Subjective:  Patient ID: Crystal Browning, female    DOB: [birth year]  Age: 85 y.o. MRN: [id number]  CC: Hypertension   HPI Crystal Browning presents for  follow-up of hypertension. Patient has no history of headache chest pain or shortness of breath or recent cough. Patient also denies symptoms of TIA such as focal numbness or weakness. Patient denies side effects from medication. States taking it regularly.Patient brings in a blood pressure log multiple readings noted the systolics range primarily in the range.  There are a few that dropped down into the the diastolics are all in the 62X and 80s.  Is stable in the 30s and 70s.   History Crystal Browning has a past medical history of Bilateral lower extremity edema (10/30/2016), Cancer ([place]) (03/28/11), Depression, Diverticulosis (02/2005), Esophageal stricture (03/03/2018), Generalized abdominal pain (07/17/2015), History of chemotherapy, Hypertension, Hypothyroid, Osteoporosis, Radiation, and TIA (transient ischemic attack).   She has a past surgical history that includes Direct laryngoscopy (03/27/2005); Tubal ligation; Total abdominal hysterectomy w/ bilateral salpingoophorectomy (1986); Cataract extraction, bilateral; and Cardioversion (N/A, 11/23/2016).   Her family history includes Alcohol abuse in her brother; COPD in her brother; Cancer (age of onset: 7) in her father; Heart disease (age of onset: 67) in her mother; Stroke in her mother.She reports that she has never smoked. She has never used smokeless tobacco. She reports that she does not drink alcohol or use drugs.  Current Outpatient Medications on File Prior to Visit  Medication Sig Dispense Refill  . apixaban (ELIQUIS) 5 MG TABS tablet Take 1 tablet (5 mg total) by mouth 2 (two) times daily. 60 tablet 5  . B Complex-Folic Acid (B COMPLEX-VITAMIN B12 PO) Take by mouth.    . clotrimazole-betamethasone (LOTRISONE) cream APPLY TO AFFECTED AREA TWICE A DAY 30 g 6  . escitalopram (LEXAPRO) 10 MG tablet  Take 1 tablet (10 mg total) by mouth daily. 90 tablet 1  . famotidine (PEPCID) 20 MG tablet Take 1 tablet (20 mg total) by mouth every 12 (twelve) hours. 60 tablet 3  . levothyroxine (SYNTHROID, LEVOTHROID) 50 MCG tablet Take 1 tablet (50 mcg total) by mouth daily. 30 tablet 6  . metoprolol succinate (TOPROL-XL) 100 MG 24 hr tablet TAKE 1 TABLET BY MOUTH DAILY. TAKE WITH OR IMMEDIATELY FOLLOWING A MEAL. 90 tablet 1  . polyethylene glycol powder (GLYCOLAX/MIRALAX) powder Take 17 g by mouth 2 (two) times daily as needed for moderate constipation. For constipation (Patient taking differently: Take 17 g by mouth 2 (two) times daily. For constipation) 3350 g 5  . triamcinolone cream (KENALOG) 0.1 % APPLY TO AFFECTED AREA TWICE A DAY 30 g 0   Current Facility-Administered Medications on File Prior to Visit  Medication Dose Route Frequency Provider Last Rate Last Dose  . 0.9 %  sodium chloride infusion  500 mL Intravenous Once Irene Shipper, MD        ROS Review of Systems  Constitutional: Negative.   HENT: Negative.   Eyes: Negative for visual disturbance.  Respiratory: Negative for shortness of breath.   Cardiovascular: Negative for chest pain.  Gastrointestinal: Negative for abdominal pain.  Musculoskeletal: Negative for arthralgias.    Objective:  BP (!) 156/82   Pulse 75   Temp (!) 96.9 F (36.1 C) (Oral)   Ht 5\' 1"  (1.549 m)   Wt 127 lb (57.6 kg)   BMI 24.00 kg/m   BP Readings from Last 3 Encounters:  10/17/18 (!) 156/82  09/15/18 (!) 199/87  08/05/18 Marland Kitchen)  157/93    Wt Readings from Last 3 Encounters:  10/17/18 127 lb (57.6 kg)  09/15/18 131 lb 8 oz (59.6 kg)  06/24/18 130 lb 9.6 oz (59.2 kg)     Physical Exam    Assessment & Plan:   Crystal Browning was seen today for hypertension.  Diagnoses and all orders for this visit:  Essential hypertension  Atrial fibrillation, unspecified type ([place])   Allergies as of 10/17/2018      Reactions   Tuberculin Tests Swelling   Vit  D-vit E-safflower Oil    Paroxetine Hcl Rash      Medication List       Accurate as of October 17, 2018  5:34 PM. Always use your most recent med list.        apixaban 5 MG Tabs tablet Commonly known as:  Eliquis Take 1 tablet (5 mg total) by mouth 2 (two) times daily.   B COMPLEX-VITAMIN B12 PO Take by mouth.   clotrimazole-betamethasone cream Commonly known as:  LOTRISONE APPLY TO AFFECTED AREA TWICE A DAY   escitalopram 10 MG tablet Commonly known as:  LEXAPRO Take 1 tablet (10 mg total) by mouth daily.   famotidine 20 MG tablet Commonly known as:  Pepcid Take 1 tablet (20 mg total) by mouth every 12 (twelve) hours.   levothyroxine 50 MCG tablet Commonly known as:  SYNTHROID, LEVOTHROID Take 1 tablet (50 mcg total) by mouth daily.   metoprolol succinate 100 MG 24 hr tablet Commonly known as:  TOPROL-XL TAKE 1 TABLET BY MOUTH DAILY. TAKE WITH OR IMMEDIATELY FOLLOWING A MEAL.   polyethylene glycol powder powder Commonly known as:  GLYCOLAX/MIRALAX Take 17 g by mouth 2 (two) times daily as needed for moderate constipation. For constipation   triamcinolone cream 0.1 % Commonly known as:  KENALOG APPLY TO AFFECTED AREA TWICE A DAY       No orders of the defined types were placed in this encounter.     Follow-up: Return in about 1 month (around 11/16/2018) for hypertension.  [name], M.D.

## 2018-12-01 ENCOUNTER — Ambulatory Visit (INDEPENDENT_AMBULATORY_CARE_PROVIDER_SITE_OTHER): Payer: Medicare Other | Admitting: Family Medicine

## 2018-12-01 ENCOUNTER — Encounter: Payer: Self-pay | Admitting: Family Medicine

## 2018-12-01 ENCOUNTER — Other Ambulatory Visit: Payer: Self-pay

## 2018-12-01 DIAGNOSIS — I10 Essential (primary) hypertension: Secondary | ICD-10-CM | POA: Diagnosis not present

## 2018-12-01 DIAGNOSIS — I4891 Unspecified atrial fibrillation: Secondary | ICD-10-CM

## 2018-12-01 NOTE — Progress Notes (Signed)
    Subjective:    Patient ID: Crystal Browning, female    DOB: [birth year], 85 y.o.   MRN: [id number]   HPI: Crystal Browning is a 85 y.o. female presenting for follow up of blood pressure. Taking med's as directed without side effects. Highest was 149/72 on March 13.  Retook later in the day and came back down.  137/67 at 11 AM today. 139/61 yesterday 124/73 two days ago.   Follow up also for atrial fibrillation. Taking eliquis to thin blood. Denies bleeding or excessive bruising. No palpitations or  rapid rate. Checking pulse with each BP check and it stays in the 70's. No chest pain or dyspnea.      Depression screen [facility] 2/9 10/17/2018 09/15/2018 06/11/2018 02/28/2018 01/29/2018  Decreased Interest 2 0 2 0 0  Down, Depressed, Hopeless 2 0 0 0 0  PHQ - 2 Score 4 0 2 0 0  Altered sleeping 1 - 0 - -  Tired, decreased energy 2 - 0 - -  Change in appetite 1 - 1 - -  Feeling bad or failure about yourself  0 - 0 - -  Trouble concentrating 1 - 3 - -  Moving slowly or fidgety/restless 1 - 0 - -  Suicidal thoughts 0 - 0 - -  PHQ-9 Score 10 - 6 - -  Difficult doing work/chores - - - - -     Relevant past medical, surgical, family and social history reviewed and updated as indicated.  Interim medical history since our last visit reviewed. Allergies and medications reviewed and updated.  ROS:  Review of Systems   Social History   Tobacco Use  Smoking Status Never Smoker  Smokeless Tobacco Never Used       Objective:     Wt Readings from Last 3 Encounters:  10/17/18 127 lb (57.6 kg)  09/15/18 131 lb 8 oz (59.6 kg)  06/24/18 130 lb 9.6 oz (59.2 kg)     Exam deferred. Pt. Harboring due to COVID 19. Phone visit performed.   Assessment & Plan:   1. Essential hypertension   2. Atrial fibrillation, unspecified type ([place])     No orders of the defined types were placed in this encounter.   No orders of the defined types were placed in this encounter.     Diagnoses and all  orders for this visit:  Essential hypertension  Atrial fibrillation, unspecified type [facility])    Virtual Visit via telephone Note  I discussed the limitations, risks, security and privacy concerns of performing an evaluation and management service by telephone and the availability of in person appointments. The patient was identified with two identifiers. Pt.expressed understanding and agreed to proceed. Pt. Is at home. Dr. Livia Snellen is in his office.  Follow Up Instructions:   I discussed the assessment and treatment plan with the patient. The patient was provided an opportunity to ask questions and all were answered. The patient agreed with the plan and demonstrated an understanding of the instructions.   The patient was advised to call back or seek an in-person evaluation if the symptoms worsen or if the condition fails to improve as anticipated.   Total minutes including chart review and phone contact time: 15   Follow up plan: Return in about 3 months (around 03/03/2019).  [name], MD [place]

## 2018-12-11 ENCOUNTER — Ambulatory Visit (INDEPENDENT_AMBULATORY_CARE_PROVIDER_SITE_OTHER): Payer: Medicare Other | Admitting: Family Medicine

## 2018-12-11 ENCOUNTER — Other Ambulatory Visit: Payer: Self-pay

## 2018-12-11 ENCOUNTER — Encounter: Payer: Self-pay | Admitting: Family Medicine

## 2018-12-11 ENCOUNTER — Telehealth: Payer: Self-pay | Admitting: Internal Medicine

## 2018-12-11 DIAGNOSIS — R300 Dysuria: Secondary | ICD-10-CM | POA: Diagnosis not present

## 2018-12-11 DIAGNOSIS — R21 Rash and other nonspecific skin eruption: Secondary | ICD-10-CM

## 2018-12-11 DIAGNOSIS — N3090 Cystitis, unspecified without hematuria: Secondary | ICD-10-CM | POA: Diagnosis not present

## 2018-12-11 MED ORDER — AMOXICILLIN-POT CLAVULANATE 875-125 MG PO TABS: 1.0000 | ORAL_TABLET | Freq: Two times a day (BID) | ORAL | 0 refills | Status: AC

## 2018-12-11 MED ORDER — CLOTRIMAZOLE-BETAMETHASONE 1-0.05 % EX CREA: TOPICAL_CREAM | CUTANEOUS | 6 refills | Status: DC

## 2018-12-11 NOTE — Progress Notes (Signed)
Virtual Visit via telephone Note Due to COVID-19, visit is conducted virtually and was requested by patient. This visit type was conducted due to national recommendations for restrictions regarding the COVID-19 Pandemic (e.g. social distancing) in an effort to limit this patient's exposure and mitigate transmission in our community. All issues noted in this document were discussed and addressed.  A physical exam was not performed with this format.   I connected with Crystal Browning on 12/11/18 at 1305 by telephone and verified that I am speaking with the correct person using two identifiers. Crystal Browning is currently located at home and family is currently with them during visit. The provider, Monia Pouch, FNP is located in their office at time of visit.  I discussed the limitations, risks, security and privacy concerns of performing an evaluation and management service by telephone and the availability of in person appointments. I also discussed with the patient that there may be a patient responsible charge related to this service. The patient expressed understanding and agreed to proceed.  Subjective:  Patient ID: Crystal Browning, female    DOB: 01/03/1934, 85 y.o.   MRN: [id number]  Chief Complaint:  Rash and Dysuria   HPI: Crystal Browning is a 85 y.o. female presenting on 12/11/2018 for Rash and Dysuria   Pt reports a rash on her arms and chest. States she scratches all of the time and causes the rash to become worse. States this has been ongoing for a long time and she is out of her cream and needs it refilled. No new exposures, grooming products, or household products. No fever, chills, weakness, confusion, bites, or wounds.  She also complains of dysuria and frequency. States this started about 2 days ago and is not improving. States she has increased her water intake but is it not helping.   Rash  This is a chronic problem. The current episode started more than 1 month ago. The problem  has been waxing and waning since onset. The affected locations include the abdomen, chest, right arm and left arm. The rash is characterized by itchiness and dryness. She was exposed to nothing. Pertinent negatives include no anorexia, congestion, cough, diarrhea, eye pain, facial edema, fatigue, fever, joint pain, nail changes, rhinorrhea, shortness of breath, sore throat or vomiting. Past treatments include anti-itch cream. The treatment provided mild relief.  Dysuria   This is a new problem. The current episode started in the past 7 days. The problem occurs every urination. The problem has been unchanged. The quality of the pain is described as burning. The pain is at a severity of 2/10. The pain is mild. There has been no fever. Associated symptoms include frequency and urgency. Pertinent negatives include no chills, discharge, flank pain, hematuria, hesitancy, nausea, possible pregnancy, sweats or vomiting. She has tried nothing for the symptoms.     Relevant past medical, surgical, family, and social history reviewed and updated as indicated.  Allergies and medications reviewed and updated.   Past Medical History:  Diagnosis Date  . Bilateral lower extremity edema 10/30/2016  . Cancer ([place]) 03/28/11   SCCA of the Supraglottic Larynx (T2NoMo)    ; S/P Chemoradiation therapy from 05/09/05 thru 06/29/05  . Depression    History of Depression- Lexapro therapy  . Diverticulosis 02/2005   History of diverticulosis with admission from 8/18 - 03/04/2005  . Esophageal stricture 03/03/2018  . Generalized abdominal pain 07/17/2015  . History of chemotherapy    S/P chemotherapy with Dr.  Karb -2006  . Hypertension   . Hypothyroid   . Osteoporosis    History of Osteoporosis with one year of Actonel only  . Radiation    S/P radiation thrapy -06/2005  . TIA (transient ischemic attack)    History of TIA's with no residual effect    Past Surgical History:  Procedure Laterality Date  . CARDIOVERSION  N/A 11/23/2016   Procedure: CARDIOVERSION;  Surgeon: Pixie Casino, MD;  Location: [facility] ENDOSCOPY;  Service: Cardiovascular;  Laterality: N/A;  . CATARACT EXTRACTION, BILATERAL    . DIRECT LARYNGOSCOPY  03/27/2005   S/P direct laryngoscopy, esophagoscopy and biopsy with Dr. Constance Holster  . TOTAL ABDOMINAL HYSTERECTOMY W/ BILATERAL SALPINGOOPHORECTOMY  1986  . TUBAL LIGATION      Social History   Socioeconomic History  . Marital status: Widowed    Spouse name: Not on file  . Number of children: 6  . Years of education: Not on file  . Highest education level: Not on file  Occupational History  . Not on file  Social Needs  . Financial resource strain: Not on file  . Food insecurity:    Worry: Not on file    Inability: Not on file  . Transportation needs:    Medical: Not on file    Non-medical: Not on file  Tobacco Use  . Smoking status: Never Smoker  . Smokeless tobacco: Never Used  Substance and Sexual Activity  . Alcohol use: No  . Drug use: No  . Sexual activity: Not on file  Lifestyle  . Physical activity:    Days per week: Not on file    Minutes per session: Not on file  . Stress: Not on file  Relationships  . Social connections:    Talks on phone: Not on file    Gets together: Not on file    Attends religious service: Not on file    Active member of club or organization: Not on file    Attends meetings of clubs or organizations: Not on file    Relationship status: Not on file  . Intimate partner violence:    Fear of current or ex partner: Not on file    Emotionally abused: Not on file    Physically abused: Not on file    Forced sexual activity: Not on file  Other Topics Concern  . Not on file  Social History Narrative  . Not on file    Outpatient Encounter Medications as of 12/11/2018  Medication Sig  . amLODipine (NORVASC) 2.5 MG tablet Take 1 tablet (2.5 mg total) by mouth daily.  Marland Kitchen amoxicillin-clavulanate (AUGMENTIN) 875-125 MG tablet Take 1 tablet by mouth 2  (two) times daily for 7 days.  Marland Kitchen apixaban (ELIQUIS) 5 MG TABS tablet Take 1 tablet (5 mg total) by mouth 2 (two) times daily.  . B Complex-Folic Acid (B COMPLEX-VITAMIN B12 PO) Take by mouth.  . clotrimazole-betamethasone (LOTRISONE) cream APPLY TO AFFECTED AREA TWICE A DAY  . escitalopram (LEXAPRO) 10 MG tablet Take 1 tablet (10 mg total) by mouth daily.  . famotidine (PEPCID) 20 MG tablet Take 1 tablet (20 mg total) by mouth every 12 (twelve) hours.  Marland Kitchen levothyroxine (SYNTHROID, LEVOTHROID) 50 MCG tablet Take 1 tablet (50 mcg total) by mouth daily.  . metoprolol succinate (TOPROL-XL) 100 MG 24 hr tablet TAKE 1 TABLET BY MOUTH DAILY. TAKE WITH OR IMMEDIATELY FOLLOWING A MEAL.  Marland Kitchen polyethylene glycol powder (GLYCOLAX/MIRALAX) powder Take 17 g by mouth 2 (two) times daily  as needed for moderate constipation. For constipation (Patient taking differently: Take 17 g by mouth 2 (two) times daily. For constipation)  . triamcinolone cream (KENALOG) 0.1 % APPLY TO AFFECTED AREA TWICE A DAY  . [DISCONTINUED] clotrimazole-betamethasone (LOTRISONE) cream APPLY TO AFFECTED AREA TWICE A DAY   Facility-Administered Encounter Medications as of 12/11/2018  Medication  . 0.9 %  sodium chloride infusion    Allergies  Allergen Reactions  . Tuberculin Tests Swelling  . Vit D-Vit E-Safflower Oil   . Paroxetine Hcl Rash    Review of Systems  Constitutional: Negative for chills, fatigue and fever.  HENT: Negative for congestion, rhinorrhea and sore throat.   Eyes: Negative for pain.  Respiratory: Negative for cough, choking, chest tightness, shortness of breath, wheezing and stridor.   Cardiovascular: Negative for chest pain, palpitations and leg swelling.  Gastrointestinal: Negative for abdominal pain, anorexia, diarrhea, nausea and vomiting.  Genitourinary: Positive for dysuria, frequency and urgency. Negative for decreased urine volume, difficulty urinating, dyspareunia, enuresis, flank pain, genital sores,  hematuria, hesitancy, menstrual problem, pelvic pain, vaginal bleeding, vaginal discharge and vaginal pain.  Musculoskeletal: Negative for joint pain.  Skin: Positive for rash. Negative for color change, nail changes, pallor and wound.  Neurological: Negative for dizziness, weakness, light-headedness and headaches.  Psychiatric/Behavioral: Negative for confusion.  All other systems reviewed and are negative.        Observations/Objective: No vital signs or physical exam, this was a telephone or virtual health encounter.  Pt alert and oriented, answers all questions appropriately, and able to speak in full sentences.    Assessment and Plan: Raeleigh was seen today for rash and dysuria.  Diagnoses and all orders for this visit:  Rash Ongoing rash due to scratching. Will refill Lotrisone cream today. If rash persists, may need referral to dermatology.  -     clotrimazole-betamethasone (LOTRISONE) cream; APPLY TO AFFECTED AREA TWICE A DAY  Dysuria Cystitis Reported symptoms consistent with cystitis. Will empirically treat with below. Report any new or worsening symptoms.  -     amoxicillin-clavulanate (AUGMENTIN) 875-125 MG tablet; Take 1 tablet by mouth 2 (two) times daily for 7 days.     Follow Up Instructions: No follow-ups on file.    I discussed the assessment and treatment plan with the patient. The patient was provided an opportunity to ask questions and all were answered. The patient agreed with the plan and demonstrated an understanding of the instructions.   The patient was advised to call back or seek an in-person evaluation if the symptoms worsen or if the condition fails to improve as anticipated.  The above assessment and management plan was discussed with the patient. The patient verbalized understanding of and has agreed to the management plan. Patient is aware to call the clinic if symptoms persist or worsen. Patient is aware when to return to the clinic for a  follow-up visit. Patient educated on when it is appropriate to go to the emergency department.    I provided 15 minutes of non-face-to-face time during this encounter. The call started at 1305. The call ended at 1320. The other time was used for coordination of care.    Monia Pouch, FNP-C [facility] [address] [place], [place] [address] [phone]

## 2018-12-11 NOTE — Telephone Encounter (Signed)
Returned call to daughter, [name], she states that pt's BP has been running high lately today it was 173/71 HR 62 lat week it was running 175/82 HR 72 205/105 HR 82. She states that her sister will not let her take her amlodipine d/t the side effects that are listed on the paperwork given with the medications. Her PCP Dr Livia Snellen rx'd the amlodipine for pt. Inez Catalina has done this frequently in the past. She denies any other sx but states that pt has been loosing weight lately. Pt is afraid of her daughter Inez Catalina. I have made a virtual appt for next week to discuss this with Isaac Laud.

## 2018-12-11 NOTE — Telephone Encounter (Signed)
New Message    Patient's daughter calling in wants to speak to a nurse about mom's medication of what she should and shouldn't be taking.

## 2018-12-16 ENCOUNTER — Telehealth: Payer: Self-pay

## 2018-12-16 ENCOUNTER — Other Ambulatory Visit: Payer: Self-pay | Admitting: Family Medicine

## 2018-12-16 ENCOUNTER — Telehealth: Payer: Self-pay | Admitting: Internal Medicine

## 2018-12-16 NOTE — Telephone Encounter (Signed)
Crystal Browning called in regards to the patient.  She is feeling just fine, and does not need a phone call with the doctor.   Crystal Browning has a daughter Crystal Browning who is trying to make unnecessary appointments on behalf of her mother.  Crystal Browning and  Crystal Browning are the only two daughters that should be discussing medical issues about the patient.  Crystal Browning and Crystal Browning were hoping that there could be a code word that is used when discussing the patient. Crystal Browning tends to give her sister's names in place of her own.    Crystal Browning and Crystal Browning , two of Crystal Browning's other children, are not caregivers and should not be making appointments or discussing medication for Crystal Browning.

## 2018-12-16 NOTE — Telephone Encounter (Signed)
LMTCB  Per chart review, no DPR on file for [facility]

## 2018-12-16 NOTE — Telephone Encounter (Signed)
Virtual Visit Pre-Appointment Phone Call  "(Name), I am calling you today to discuss your upcoming appointment. We are currently trying to limit exposure to the virus that causes COVID-19 by seeing patients at home rather than in the office."  1. "What is the BEST phone number to call the day of the visit?" - include this in appointment notes  2. "Do you have or have access to (through a family member/friend) a smartphone with video capability that we can use for your visit?" a. If yes - list this number in appt notes as "cell" (if different from BEST phone #) and list the appointment type as a VIDEO visit in appointment notes b. If no - list the appointment type as a PHONE visit in appointment notes  3. Confirm consent - "In the setting of the current Covid19 crisis, you are scheduled for a (phone or video) visit with your provider on (date) at (time).  Just as we do with many in-office visits, in order for you to participate in this visit, we must obtain consent.  If you'd like, I can send this to your mychart (if signed up) or email for you to review.  Otherwise, I can obtain your verbal consent now.  All virtual visits are billed to your insurance company just like a normal visit would be.  By agreeing to a virtual visit, we'd like you to understand that the technology does not allow for your provider to perform an examination, and thus may limit your provider's ability to fully assess your condition. If your provider identifies any concerns that need to be evaluated in person, we will make arrangements to do so.  Finally, though the technology is pretty good, we cannot assure that it will always work on either your or our end, and in the setting of a video visit, we may have to convert it to a phone-only visit.  In either situation, we cannot ensure that we have a secure connection.  Are you willing to proceed?" STAFF: Did the patient verbally acknowledge consent to telehealth visit? Document  YES/NO here: YES 4.   5. Advise patient to be prepared - "Two hours prior to your appointment, go ahead and check your blood pressure, pulse, oxygen saturation, and your weight (if you have the equipment to check those) and write them all down. When your visit starts, your provider will ask you for this information. If you have an Apple Watch or Kardia device, please plan to have heart rate information ready on the day of your appointment. Please have a pen and paper handy nearby the day of the visit as well."  6. Give patient instructions for MyChart download to smartphone OR Doximity/Doxy.me as below if video visit (depending on what platform provider is using)  7. Inform patient they will receive a phone call 15 minutes prior to their appointment time (may be from unknown caller ID) so they should be prepared to answer    TELEPHONE CALL NOTE  Crystal Browning has been deemed a candidate for a follow-up tele-health visit to limit community exposure during the Covid-19 pandemic. I spoke with the patient via phone to ensure availability of phone/video source, confirm preferred email & phone number, and discuss instructions and expectations.  I reminded Crystal Browning to be prepared with any vital sign and/or heart rhythm information that could potentially be obtained via home monitoring, at the time of her visit. I reminded Crystal Browning to expect a phone call  prior to her visit.  Jacqulynn Cadet, [place] 12/16/2018 9:04 AM   INSTRUCTIONS FOR DOWNLOADING THE MYCHART APP TO SMARTPHONE  - The patient must first make sure to have activated MyChart and know their login information - If Apple, go to [employer] and type in MyChart in the search bar and download the app. If Android, ask patient to go to [employer] and type in [place] in the search bar and download the app. The app is free but as with any other app downloads, their phone may require them to verify saved payment information or  Apple/Android password.  - The patient will need to then log into the app with their MyChart username and password, and select [facility] as their healthcare provider to link the account. When it is time for your visit, go to the MyChart app, find appointments, and click Begin Video Visit. Be sure to Select Allow for your device to access the Microphone and Camera for your visit. You will then be connected, and your provider will be with you shortly.  **If they have any issues connecting, or need assistance please contact MyChart service desk [phone])**  **If using a computer, in order to ensure the best quality for their visit they will need to use either of the following Internet Browsers: [employer], or Google Chrome**  IF USING DOXIMITY or DOXY.ME - The patient will receive a link just prior to their visit by text.     FULL LENGTH CONSENT FOR TELE-HEALTH VISIT   I hereby voluntarily request, consent and authorize [facility] and its employed or contracted physicians, physician assistants, nurse practitioners or other licensed health care professionals (the Practitioner), to provide me with telemedicine health care services (the "Services") as deemed necessary by the treating Practitioner. I acknowledge and consent to receive the Services by the Practitioner via telemedicine. I understand that the telemedicine visit will involve communicating with the Practitioner through live audiovisual communication technology and the disclosure of certain medical information by electronic transmission. I acknowledge that I have been given the opportunity to request an in-person assessment or other available alternative prior to the telemedicine visit and am voluntarily participating in the telemedicine visit.  I understand that I have the right to withhold or withdraw my consent to the use of telemedicine in the course of my care at any time, without affecting my right to future care  or treatment, and that the Practitioner or I may terminate the telemedicine visit at any time. I understand that I have the right to inspect all information obtained and/or recorded in the course of the telemedicine visit and may receive copies of available information for a reasonable fee.  I understand that some of the potential risks of receiving the Services via telemedicine include:  Marland Kitchen Delay or interruption in medical evaluation due to technological equipment failure or disruption; . Information transmitted may not be sufficient (e.g. poor resolution of images) to allow for appropriate medical decision making by the Practitioner; and/or  . In rare instances, security protocols could fail, causing a breach of personal health information.  Furthermore, I acknowledge that it is my responsibility to provide information about my medical history, conditions and care that is complete and accurate to the best of my ability. I acknowledge that Practitioner's advice, recommendations, and/or decision may be based on factors not within their control, such as incomplete or inaccurate data provided by me or distortions of diagnostic images or specimens that may result from electronic  transmissions. I understand that the practice of medicine is not an exact science and that Practitioner makes no warranties or guarantees regarding treatment outcomes. I acknowledge that I will receive a copy of this consent concurrently upon execution via email to the email address I last provided but may also request a printed copy by calling the office of [facility].    I understand that my insurance will be billed for this visit.   I have read or had this consent read to me. . I understand the contents of this consent, which adequately explains the benefits and risks of the Services being provided via telemedicine.  . I have been provided ample opportunity to ask questions regarding this consent and the Services and have had  my questions answered to my satisfaction. . I give my informed consent for the services to be provided through the use of telemedicine in my medical care  By participating in this telemedicine visit I agree to the above.

## 2018-12-18 ENCOUNTER — Telehealth: Payer: Medicare Other | Admitting: Physician Assistant

## 2018-12-19 ENCOUNTER — Other Ambulatory Visit: Payer: Self-pay | Admitting: Family Medicine

## 2018-12-19 NOTE — Telephone Encounter (Addendum)
Left message for daughter, message sent to [facility] for DPR to be placed in the mail for Ms Yzaguirre, dtr to call back if needed.

## 2019-02-08 ENCOUNTER — Other Ambulatory Visit: Payer: Self-pay | Admitting: Family Medicine

## 2019-02-26 ENCOUNTER — Other Ambulatory Visit: Payer: Self-pay

## 2019-02-26 ENCOUNTER — Encounter: Payer: Self-pay | Admitting: Family Medicine

## 2019-02-26 ENCOUNTER — Ambulatory Visit (INDEPENDENT_AMBULATORY_CARE_PROVIDER_SITE_OTHER): Payer: Medicare Other | Admitting: Family Medicine

## 2019-02-26 DIAGNOSIS — I10 Essential (primary) hypertension: Secondary | ICD-10-CM

## 2019-02-26 NOTE — Progress Notes (Signed)
Virtual Visit via telephone Note Due to COVID-19 pandemic this visit was conducted virtually. This visit type was conducted due to national recommendations for restrictions regarding the COVID-19 Pandemic (e.g. social distancing, sheltering in place) in an effort to limit this patient's exposure and mitigate transmission in our community. All issues noted in this document were discussed and addressed.  A physical exam was not performed with this format.   I connected with Crystal Browning on 02/26/19 at 1345 by telephone and verified that I am speaking with the correct person using two identifiers. Crystal Browning is currently located at home and no one is currently with them during visit. The provider, Monia Pouch, FNP is located in their office at time of visit.  I discussed the limitations, risks, security and privacy concerns of performing an evaluation and management service by telephone and the availability of in person appointments. I also discussed with the patient that there may be a patient responsible charge related to this service. The patient expressed understanding and agreed to proceed.  Subjective:  Patient ID: Crystal Browning, female    DOB: December 22, 1933, 85 y.o.   MRN: [id number]  Chief Complaint:  Hypertension   HPI: Crystal Browning is a 85 y.o. female presenting on 02/26/2019 for Hypertension   Pt reports her blood pressure is elevated today. States she checked it this morning and it was 164/87. Pt checked BP while on phone with provider and it was 148/78. Pt denies chest pain, headaches, confusion, weakness, focal deficits, shortness of breath, or dizziness. No syncope or fatigue. States she has been taking her medications as prescribed. No recent changes in medications. She does try to watch salt intake. She has increased her water intake over the last few days to stay hydrated.   Hypertension This is a chronic problem. The current episode started more than 1 year ago. Pertinent  negatives include no anxiety, blurred vision, chest pain, headaches, malaise/fatigue, neck pain, orthopnea, palpitations, PND, shortness of breath or sweats. Past treatments include calcium channel blockers and beta blockers. The current treatment provides moderate improvement.     Relevant past medical, surgical, family, and social history reviewed and updated as indicated.  Allergies and medications reviewed and updated.   Past Medical History:  Diagnosis Date  . Bilateral lower extremity edema 10/30/2016  . Cancer ([place]) 03/28/11   SCCA of the Supraglottic Larynx (T2NoMo)    ; S/P Chemoradiation therapy from 05/09/05 thru 06/29/05  . Depression    History of Depression- Lexapro therapy  . Diverticulosis 02/2005   History of diverticulosis with admission from 8/18 - 03/04/2005  . Esophageal stricture 03/03/2018  . Generalized abdominal pain 07/17/2015  . History of chemotherapy    S/P chemotherapy with Dr. Sonny Dandy -2006  . Hypertension   . Hypothyroid   . Osteoporosis    History of Osteoporosis with one year of Actonel only  . Radiation    S/P radiation thrapy -06/2005  . TIA (transient ischemic attack)    History of TIA's with no residual effect    Past Surgical History:  Procedure Laterality Date  . CARDIOVERSION N/A 11/23/2016   Procedure: CARDIOVERSION;  Surgeon: Pixie Casino, MD;  Location: [facility] ENDOSCOPY;  Service: Cardiovascular;  Laterality: N/A;  . CATARACT EXTRACTION, BILATERAL    . DIRECT LARYNGOSCOPY  03/27/2005   S/P direct laryngoscopy, esophagoscopy and biopsy with Dr. Constance Holster  . TOTAL ABDOMINAL HYSTERECTOMY W/ BILATERAL SALPINGOOPHORECTOMY  1986  . TUBAL LIGATION  Social History   Socioeconomic History  . Marital status: Widowed    Spouse name: Not on file  . Number of children: 6  . Years of education: Not on file  . Highest education level: Not on file  Occupational History  . Not on file  Social Needs  . Financial resource strain: Not on file  . Food  insecurity    Worry: Not on file    Inability: Not on file  . Transportation needs    Medical: Not on file    Non-medical: Not on file  Tobacco Use  . Smoking status: Never Smoker  . Smokeless tobacco: Never Used  Substance and Sexual Activity  . Alcohol use: No  . Drug use: No  . Sexual activity: Not on file  Lifestyle  . Physical activity    Days per week: Not on file    Minutes per session: Not on file  . Stress: Not on file  Relationships  . Social Herbalist on phone: Not on file    Gets together: Not on file    Attends religious service: Not on file    Active member of club or organization: Not on file    Attends meetings of clubs or organizations: Not on file    Relationship status: Not on file  . Intimate partner violence    Fear of current or ex partner: Not on file    Emotionally abused: Not on file    Physically abused: Not on file    Forced sexual activity: Not on file  Other Topics Concern  . Not on file  Social History Narrative  . Not on file    Outpatient Encounter Medications as of 02/26/2019  Medication Sig  . amLODipine (NORVASC) 2.5 MG tablet Take 1 tablet (2.5 mg total) by mouth daily.  . B Complex-Folic Acid (B COMPLEX-VITAMIN B12 PO) Take by mouth.  . clotrimazole-betamethasone (LOTRISONE) cream APPLY TO AFFECTED AREA TWICE A DAY  . ELIQUIS 5 MG TABS tablet TAKE 1 TABLET BY MOUTH TWICE A DAY  . escitalopram (LEXAPRO) 10 MG tablet TAKE 1 TABLET BY MOUTH EVERY DAY  . famotidine (PEPCID) 20 MG tablet Take 1 tablet (20 mg total) by mouth every 12 (twelve) hours.  Marland Kitchen levothyroxine (SYNTHROID, LEVOTHROID) 50 MCG tablet Take 1 tablet (50 mcg total) by mouth daily.  . metoprolol succinate (TOPROL-XL) 100 MG 24 hr tablet TAKE 1 TABLET BY MOUTH DAILY. TAKE WITH OR IMMEDIATELY FOLLOWING A MEAL.  Marland Kitchen polyethylene glycol powder (GLYCOLAX/MIRALAX) powder Take 17 g by mouth 2 (two) times daily as needed for moderate constipation. For constipation (Patient  taking differently: Take 17 g by mouth 2 (two) times daily. For constipation)  . triamcinolone cream (KENALOG) 0.1 % APPLY TO AFFECTED AREA TWICE A DAY   Facility-Administered Encounter Medications as of 02/26/2019  Medication  . 0.9 %  sodium chloride infusion    Allergies  Allergen Reactions  . Tuberculin Tests Swelling  . Vit D-Vit E-Safflower Oil   . Paroxetine Hcl Rash    Review of Systems  Constitutional: Negative for activity change, appetite change, chills, diaphoresis, fatigue, fever, malaise/fatigue and unexpected weight change.  Eyes: Negative for blurred vision, photophobia and visual disturbance.  Respiratory: Negative for cough, chest tightness and shortness of breath.   Cardiovascular: Negative for chest pain, palpitations, orthopnea and PND.  Genitourinary: Negative for decreased urine volume and difficulty urinating.  Musculoskeletal: Negative for neck pain.  Neurological: Negative for dizziness, tremors, seizures,  syncope, facial asymmetry, speech difficulty, weakness, light-headedness, numbness and headaches.  Psychiatric/Behavioral: Negative for confusion.  All other systems reviewed and are negative.        Observations/Objective: No vital signs or physical exam, this was a telephone or virtual health encounter.  Pt alert and oriented, answers all questions appropriately, and able to speak in full sentences.    Assessment and Plan: Crystal Browning was seen today for hypertension.  Diagnoses and all orders for this visit:  Essential hypertension Reported elevated BP at home. No associated symptoms, no red flags concerning for acute neurovascular event. DASH diet discussed. Pt to keep record of BP and report any persistent high or low readings. Pt to follow up in office with PCP within the month for reevaluation. Pt aware of symptoms that require emergent evaluation and treatment. Report any new or worsening symptoms.     Follow Up Instructions: Return in about 4  weeks (around 03/26/2019), or if symptoms worsen or fail to improve, for HTN.    I discussed the assessment and treatment plan with the patient. The patient was provided an opportunity to ask questions and all were answered. The patient agreed with the plan and demonstrated an understanding of the instructions.   The patient was advised to call back or seek an in-person evaluation if the symptoms worsen or if the condition fails to improve as anticipated.  The above assessment and management plan was discussed with the patient. The patient verbalized understanding of and has agreed to the management plan. Patient is aware to call the clinic if symptoms persist or worsen. Patient is aware when to return to the clinic for a follow-up visit. Patient educated on when it is appropriate to go to the emergency department.    I provided 25 minutes of non-face-to-face time during this encounter. The call started at 1345. The call ended at 1410. The other time was used for coordination of care.    Monia Pouch, FNP-C [facility] [address] [place], [place] [address] [phone] 02/26/19

## 2019-02-27 ENCOUNTER — Ambulatory Visit: Payer: Medicare Other | Admitting: Family Medicine

## 2019-03-25 ENCOUNTER — Ambulatory Visit (INDEPENDENT_AMBULATORY_CARE_PROVIDER_SITE_OTHER): Payer: Medicare Other | Admitting: Family Medicine

## 2019-03-25 ENCOUNTER — Encounter: Payer: Self-pay | Admitting: Family Medicine

## 2019-03-25 DIAGNOSIS — I4891 Unspecified atrial fibrillation: Secondary | ICD-10-CM | POA: Diagnosis not present

## 2019-03-25 DIAGNOSIS — Z7901 Long term (current) use of anticoagulants: Secondary | ICD-10-CM

## 2019-03-25 DIAGNOSIS — E039 Hypothyroidism, unspecified: Secondary | ICD-10-CM | POA: Diagnosis not present

## 2019-03-25 DIAGNOSIS — I10 Essential (primary) hypertension: Secondary | ICD-10-CM | POA: Diagnosis not present

## 2019-03-25 NOTE — Progress Notes (Signed)
Subjective:    Patient ID: Crystal Browning, female    DOB: [birth year], 85 y.o.   MRN: [id number]   HPI: Crystal Browning is a 85 y.o. female presenting for  presents for  follow-up of hypertension. Patient has no history of headache chest pain or shortness of breath or recent cough. Patient also denies symptoms of TIA such as focal numbness or weakness. Patient denies side effects from medication. States taking it regularly. Multiple home readings were read. One was 164.92. The rest were 120-135/60-80 range.   Patient presents for follow-up on  thyroid. The patient has a history of hypothyroidism for many years. It has been stable recently. Pt. denies any change in  voice, loss of hair, heat or cold intolerance. Energy level has been adequate to good. Patient denies constipation and diarrhea. No myxedema. Medication is as noted below. Verified that pt is taking it daily on an empty stomach. Well tolerated. Patient in for follow-up of atrial fibrillation. Patient denies any recent bouts of chest pain or palpitations. Additionally, patient is taking anticoagulants. Patient denies any recent excessive bleeding episodes including epistaxis, bleeding from the gums, genitalia, rectal bleeding or hematuria. Additionally there has been no excessive bruising.   Depression screen [facility] 2/9 10/17/2018 09/15/2018 06/11/2018 02/28/2018 01/29/2018  Decreased Interest 2 0 2 0 0  Down, Depressed, Hopeless 2 0 0 0 0  PHQ - 2 Score 4 0 2 0 0  Altered sleeping 1 - 0 - -  Tired, decreased energy 2 - 0 - -  Change in appetite 1 - 1 - -  Feeling bad or failure about yourself  0 - 0 - -  Trouble concentrating 1 - 3 - -  Moving slowly or fidgety/restless 1 - 0 - -  Suicidal thoughts 0 - 0 - -  PHQ-9 Score 10 - 6 - -  Difficult doing work/chores - - - - -     Relevant past medical, surgical, family and social history reviewed and updated as indicated.  Interim medical history since our last visit reviewed. Allergies  and medications reviewed and updated.  ROS:  Review of Systems  Constitutional: Negative.   HENT: Negative.   Eyes: Negative for visual disturbance.  Respiratory: Negative for shortness of breath.   Cardiovascular: Negative for chest pain.  Gastrointestinal: Negative for abdominal pain.  Musculoskeletal: Negative for arthralgias.     Social History   Tobacco Use  Smoking Status Never Smoker  Smokeless Tobacco Never Used       Objective:     Wt Readings from Last 3 Encounters:  10/17/18 127 lb (57.6 kg)  09/15/18 131 lb 8 oz (59.6 kg)  06/24/18 130 lb 9.6 oz (59.2 kg)     Exam deferred. Pt. Harboring due to COVID 19. Phone visit performed.   Assessment & Plan:   1. Essential hypertension   2. Atrial fibrillation, unspecified type ([place])   3. Long term (current) use of anticoagulants   4. Hypothyroidism, unspecified type     No orders of the defined types were placed in this encounter.   No orders of the defined types were placed in this encounter.     Diagnoses and all orders for this visit:  Essential hypertension  Atrial fibrillation, unspecified type ([place])  Long term (current) use of anticoagulants  Hypothyroidism, unspecified type    Virtual Visit via telephone Note  I discussed the limitations, risks, security and privacy concerns of performing an evaluation and management service by  telephone and the availability of in person appointments. The patient was identified with two identifiers. Pt.expressed understanding and agreed to proceed. Pt. Is at home. Dr. Livia Snellen is in his office.  Follow Up Instructions:   I discussed the assessment and treatment plan with the patient. The patient was provided an opportunity to ask questions and all were answered. The patient agreed with the plan and demonstrated an understanding of the instructions.   The patient was advised to call back or seek an in-person evaluation if the symptoms worsen or if the condition  fails to improve as anticipated.   Total minutes including chart review and phone contact time: 24   Follow up plan: Return in about 3 months (around 06/24/2019).  [name], MD [place]

## 2019-03-26 ENCOUNTER — Ambulatory Visit: Payer: Medicare Other | Admitting: Family Medicine

## 2019-04-02 DIAGNOSIS — I10 Essential (primary) hypertension: Secondary | ICD-10-CM | POA: Diagnosis not present

## 2019-04-02 DIAGNOSIS — E079 Disorder of thyroid, unspecified: Secondary | ICD-10-CM | POA: Diagnosis not present

## 2019-04-02 DIAGNOSIS — R300 Dysuria: Secondary | ICD-10-CM | POA: Diagnosis not present

## 2019-04-22 ENCOUNTER — Other Ambulatory Visit: Payer: Self-pay | Admitting: Family Medicine

## 2019-04-28 ENCOUNTER — Other Ambulatory Visit: Payer: Self-pay | Admitting: Family Medicine

## 2019-05-05 ENCOUNTER — Other Ambulatory Visit: Payer: Self-pay | Admitting: Nurse Practitioner

## 2019-05-13 ENCOUNTER — Other Ambulatory Visit: Payer: Self-pay

## 2019-05-14 ENCOUNTER — Encounter: Payer: Self-pay | Admitting: Family Medicine

## 2019-05-14 ENCOUNTER — Ambulatory Visit (INDEPENDENT_AMBULATORY_CARE_PROVIDER_SITE_OTHER): Payer: Medicare Other | Admitting: Family Medicine

## 2019-05-14 VITALS — BP 172/71 | HR 77 | Temp 97.4°F | Ht 61.0 in | Wt 127.0 lb

## 2019-05-14 DIAGNOSIS — R300 Dysuria: Secondary | ICD-10-CM | POA: Diagnosis not present

## 2019-05-14 DIAGNOSIS — E039 Hypothyroidism, unspecified: Secondary | ICD-10-CM

## 2019-05-14 DIAGNOSIS — I10 Essential (primary) hypertension: Secondary | ICD-10-CM

## 2019-05-14 LAB — MICROSCOPIC EXAMINATION: Renal Epithel, UA: NONE SEEN /hpf

## 2019-05-14 LAB — URINALYSIS, COMPLETE
Bilirubin, UA: NEGATIVE
Glucose, UA: NEGATIVE
Ketones, UA: NEGATIVE
Leukocytes,UA: NEGATIVE
Nitrite, UA: NEGATIVE
Protein,UA: NEGATIVE
Specific Gravity, UA: 1.025 (ref 1.005–1.030)
Urobilinogen, Ur: 0.2 mg/dL (ref 0.2–1.0)
pH, UA: 5.5 (ref 5.0–7.5)

## 2019-05-14 MED ORDER — LEVOTHYROXINE SODIUM 25 MCG PO TABS: 25.0000 ug | ORAL_TABLET | Freq: Every day | ORAL | 2 refills | Status: DC

## 2019-05-14 NOTE — Progress Notes (Signed)
Subjective:  Patient ID: Crystal Browning, female    DOB: [birth year]  Age: 85 y.o. MRN: [id number]  CC: Thyroid Problem, Hallucinations, and Dysuria   HPI Crystal Browning presents for weight loss, diarrhea, hallucinations. Daughter accompanies the patient and says she held Crystal Browning thyroid pill on two occasions recently and the hallucinations did not occur those days. Denies palpitations.   Having burning with urination at times. Daughter also was told by someone that a bad UTI can cause hallucinations.  Pt. Has a log of BP readings taken daily over the last month. Systolic trends in the Q000111Q and diastolic in the Q000111Q.   Depression screen [facility] 2/9 05/14/2019 10/17/2018 09/15/2018  Decreased Interest 1 2 0  Down, Depressed, Hopeless 1 2 0  PHQ - 2 Score 2 4 0  Altered sleeping 0 1 -  Tired, decreased energy 1 2 -  Change in appetite 1 1 -  Feeling bad or failure about yourself  2 0 -  Trouble concentrating 0 1 -  Moving slowly or fidgety/restless 0 1 -  Suicidal thoughts 0 0 -  PHQ-9 Score 6 10 -  Difficult doing work/chores Somewhat difficult - -    History Crystal Browning has a past medical history of Bilateral lower extremity edema (10/30/2016), Cancer ([place]) (03/28/11), Depression, Diverticulosis (02/2005), Esophageal stricture (03/03/2018), Generalized abdominal pain (07/17/2015), History of chemotherapy, Hypertension, Hypothyroid, Osteoporosis, Radiation, and TIA (transient ischemic attack).   She has a past surgical history that includes Direct laryngoscopy (03/27/2005); Tubal ligation; Total abdominal hysterectomy w/ bilateral salpingoophorectomy (1986); Cataract extraction, bilateral; and Cardioversion (N/A, 11/23/2016).   Her family history includes Alcohol abuse in her brother; COPD in her brother; Cancer (age of onset: 12) in her father; Heart disease (age of onset: 23) in her mother; Stroke in her mother.She reports that she has never smoked. She has never used smokeless tobacco. She reports that  she does not drink alcohol or use drugs.    ROS Review of Systems  Constitutional: Negative.   HENT: Negative for congestion.   Eyes: Negative for visual disturbance.  Respiratory: Negative for shortness of breath.   Cardiovascular: Negative for chest pain.  Gastrointestinal: Negative for abdominal pain, constipation, diarrhea, nausea and vomiting.  Genitourinary: Positive for difficulty urinating and dysuria.  Musculoskeletal: Negative for arthralgias and myalgias.  Neurological: Negative for headaches.  Psychiatric/Behavioral: Positive for confusion and hallucinations. Negative for sleep disturbance.    Objective:  BP (!) 172/71   Pulse 77   Temp (!) 97.4 F (36.3 C) (Temporal)   Ht 5\' 1"  (1.549 m)   Wt 127 lb (57.6 kg)   SpO2 97%   BMI 24.00 kg/m   BP Readings from Last 3 Encounters:  05/14/19 (!) 172/71  10/17/18 (!) 156/82  09/15/18 (!) 199/87    Wt Readings from Last 3 Encounters:  05/14/19 127 lb (57.6 kg)  10/17/18 127 lb (57.6 kg)  09/15/18 131 lb 8 oz (59.6 kg)     Physical Exam Constitutional:      General: She is not in acute distress.    Appearance: She is well-developed.  HENT:     Head: Normocephalic and atraumatic.     Right Ear: External ear normal.     Left Ear: External ear normal.     Nose: Nose normal.  Eyes:     Conjunctiva/sclera: Conjunctivae normal.     Pupils: Pupils are equal, round, and reactive to light.  Neck:     Musculoskeletal: Normal range of  motion and neck supple.     Thyroid: No thyromegaly.  Cardiovascular:     Rate and Rhythm: Normal rate and regular rhythm.     Heart sounds: Normal heart sounds. No murmur.  Pulmonary:     Effort: Pulmonary effort is normal. No respiratory distress.     Breath sounds: Normal breath sounds. No wheezing or rales.  Abdominal:     General: Bowel sounds are normal. There is no distension.     Palpations: Abdomen is soft.     Tenderness: There is no abdominal tenderness.   Lymphadenopathy:     Cervical: No cervical adenopathy.  Skin:    General: Skin is warm and dry.  Neurological:     Mental Status: She is alert and oriented to person, place, and time.     Deep Tendon Reflexes: Reflexes are normal and symmetric.  Psychiatric:        Behavior: Behavior normal.        Thought Content: Thought content normal.        Judgment: Judgment normal.       Assessment & Plan:   Crystal Browning was seen today for thyroid problem, hallucinations and dysuria.  Diagnoses and all orders for this visit:  Dysuria -     Urinalysis, Complete -     Urine Culture  Hypothyroidism, unspecified type  Essential hypertension  Other orders -     levothyroxine (SYNTHROID) 25 MCG tablet; Take 1 tablet (25 mcg total) by mouth daily.       I have changed Crystal Browning's levothyroxine. I am also having her maintain her polyethylene glycol powder, B Complex-Folic Acid (B COMPLEX-VITAMIN B12 PO), triamcinolone cream, amLODipine, clotrimazole-betamethasone, metoprolol succinate, Eliquis, famotidine, and escitalopram. We will continue to administer sodium chloride.  Allergies as of 05/14/2019      Reactions   Tuberculin Tests Swelling   Vit D-vit E-safflower Oil    Paroxetine Hcl Rash      Medication List       Accurate as of May 14, 2019  3:01 PM. If you have any questions, ask your nurse or doctor.        amLODipine 2.5 MG tablet Commonly known as: NORVASC Take 1 tablet (2.5 mg total) by mouth daily.   B COMPLEX-VITAMIN B12 PO Take by mouth.   clotrimazole-betamethasone cream Commonly known as: LOTRISONE APPLY TO AFFECTED AREA TWICE A DAY   Eliquis 5 MG Tabs tablet Generic drug: apixaban TAKE 1 TABLET BY MOUTH TWICE A DAY   escitalopram 10 MG tablet Commonly known as: LEXAPRO TAKE 1 TABLET BY MOUTH EVERY DAY   famotidine 20 MG tablet Commonly known as: PEPCID TAKE 1 TABLET (20 MG TOTAL) BY MOUTH EVERY 12 (TWELVE) HOURS.   levothyroxine 25 MCG  tablet Commonly known as: SYNTHROID Take 1 tablet (25 mcg total) by mouth daily. What changed:   medication strength  how much to take Changed by: [name], MD   metoprolol succinate 100 MG 24 hr tablet Commonly known as: TOPROL-XL TAKE 1 TABLET BY MOUTH DAILY. TAKE WITH OR IMMEDIATELY FOLLOWING A MEAL.   polyethylene glycol powder 17 GM/SCOOP powder Commonly known as: GLYCOLAX/MIRALAX Take 17 g by mouth 2 (two) times daily as needed for moderate constipation. For constipation What changed: when to take this   triamcinolone cream 0.1 % Commonly known as: KENALOG APPLY TO AFFECTED AREA TWICE A DAY      Pt. Will start lower dose of synthroid. Will monitor for 6 weeks and seee  if hallucinations resolve. BP may also come down. Continue to check daily.  Follow-up: Return in about 6 weeks (around 06/25/2019).  [name], M.D.

## 2019-05-16 LAB — URINE CULTURE

## 2019-06-12 ENCOUNTER — Other Ambulatory Visit: Payer: Self-pay | Admitting: Family Medicine

## 2019-06-15 ENCOUNTER — Other Ambulatory Visit: Payer: Self-pay | Admitting: Family Medicine

## 2019-06-30 ENCOUNTER — Telehealth: Payer: Self-pay | Admitting: Internal Medicine

## 2019-06-30 NOTE — Telephone Encounter (Signed)
Daughter notified OK to come to visit.

## 2019-06-30 NOTE — Telephone Encounter (Signed)
Thoughts on this request? I don't see anything listed in her chart that indicates a need for assistance

## 2019-06-30 NOTE — Telephone Encounter (Signed)
Patients daughter is calling requesting she attend the appointment on 07/01/19 due to the patient not being able to fully understand.

## 2019-06-30 NOTE — Telephone Encounter (Signed)
That's ok to allow daughter to come if she needs to speak for her mom.  Dr Lemmie Evens

## 2019-07-01 ENCOUNTER — Other Ambulatory Visit: Payer: Self-pay

## 2019-07-01 ENCOUNTER — Ambulatory Visit (INDEPENDENT_AMBULATORY_CARE_PROVIDER_SITE_OTHER): Payer: Medicare Other | Admitting: Internal Medicine

## 2019-07-01 ENCOUNTER — Encounter: Payer: Self-pay | Admitting: Internal Medicine

## 2019-07-01 VITALS — BP 183/79 | HR 74 | Temp 97.2°F | Ht 61.0 in | Wt 127.6 lb

## 2019-07-01 DIAGNOSIS — I872 Venous insufficiency (chronic) (peripheral): Secondary | ICD-10-CM

## 2019-07-01 DIAGNOSIS — I10 Essential (primary) hypertension: Secondary | ICD-10-CM

## 2019-07-01 DIAGNOSIS — I4891 Unspecified atrial fibrillation: Secondary | ICD-10-CM

## 2019-07-01 NOTE — Patient Instructions (Signed)
Medication Instructions:  Your physician recommends that you continue on your current medications as directed. Please refer to the Current Medication list given to you today.  *If you need a refill on your cardiac medications before your next appointment, please call your pharmacy*   Follow-Up: At [facility], you and your health needs are our priority.  As part of our continuing mission to provide you with exceptional heart care, we have created designated Provider Care Teams.  These Care Teams include your primary Cardiologist (physician) and Advanced Practice Providers (APPs -  Physician Assistants and Nurse Practitioners) who all work together to provide you with the care you need, when you need it.  Your next appointment:   12 month(s)  The format for your next appointment:   Either In Person or Virtual  Provider:   Raliegh Ip Mali Hilty, MD  Other Instructions

## 2019-07-01 NOTE — Progress Notes (Signed)
OFFICE CONSULT NOTE  Chief Complaint:  No complaints  Primary Care Physician: Crystal Fraise, MD  HPI:  Crystal Browning is a 85 y.o. female who is being seen today for the evaluation of atrial fibrillation at the request of Crystal Fraise, MD. [name] was recently seen for symptoms of palpitations and shortness of breath which was mostly noted by her daughter. She said that they routinely go shopping at [facility] and that while walking through the building she became more short of breath easier. It does not seem that Crystal Browning typically complaints about any worsening shortness of breath or symptoms but it was clear to her daughter that she was struggling somewhat to get around. She noted that her pulse was irregular and when she was seen in her primary care doctor's office she was found to be in atrial fibrillation. There is no history of A. fib in the family or personal history although she did have possibly a mild MI after the death of her husband, which sounds like it could've been a stress-induced cardiomyopathic event. She was a ready on metoprolol succinate 50 mg daily and that was increased up to 100 mg daily for rate control and started on Xarelto. Unfortunate she was given the Xarelto DBT dose pack which is 50 mg twice a day and then switching to 20 mg daily. The appropriate dose for her would've been 20 mg daily from the beginning. So far she has not had any bleeding problems. She is also on low-dose aspirin. There is apparently history of diverticulum.  12/14/2016  Crystal Browning returns today for follow-up of her cardioversion. This is performed by myself on 11/23/2016. She was cardioverted once at 150 J successfully to sinus rhythm. Per her daughter's report she had a partial choking event which was related to x-ray therapy in the past 2 days after her cardioversion. EMS was called and she was noted to be in sinus rhythm at the time. She says she's felt very good since then. She continues to feel  well today. Unfortunately, her EKG today shows she is back in atrial fibrillation although rate controlled in the 70s. Based on this finding I think that she is actually somewhat asymptomatic with the A. fib and therefore I would not recommend pursuing a rhythm control strategy in her. We also discussed anticoagulation. Her daughter's concerned about possibly higher bleeding risk on Xarelto and is interested in switching her to Eliquis.  06/14/2017  Crystal Browning returns today for follow-up.  She is done well without any recurrent A. fib.  She occasionally gets some dizziness but I do not feel that this is the A. fib.  EKG shows sinus rhythm today.  She denies any bleeding problems on Eliquis.  Recently labs were done in August by her primary care provider which showed normal renal function and stable hemoglobin and hematocrit.  Blood pressure is at goal today.  06/24/2018  Crystal Browning is seen today in follow-up.  She seems to be back in A. fib today.  Apparently recently she was seen in her PCPs office and was in sinus rhythm.  This suggests her A. fib is paroxysmal.  She is tolerating Eliquis without any bleeding problems.  Her blood pressure is noted to be high today at 180/84 however recheck came down to [id number] systolic.  Her PCP has noted labile blood pressures in the past and apparently she was placed on some additional blood pressure medicine which "bottomed her out" according to her daughter.  Labs from June 11, 2018 showed total cholesterol 166, HDL 41, LDL 97 and triglycerides 141.  07/01/2019  Crystal Browning returns today in follow-up.  She is accompanied by her daughter.  Blood pressure is again elevated in the office today however at home she is certain it runs around [id number] systolic.  She denies any chest pain or worsening shortness of breath.  She is back in A. fib today with controlled ventricular response at 74, but is asymptomatic with this.  She is anticoagulated on Eliquis without bleeding  issues.  PMHx:  Past Medical History:  Diagnosis Date  . Bilateral lower extremity edema 10/30/2016  . Cancer ([place]) 03/28/11   SCCA of the Supraglottic Larynx (T2NoMo)    ; S/P Chemoradiation therapy from 05/09/05 thru 06/29/05  . Depression    History of Depression- Lexapro therapy  . Diverticulosis 02/2005   History of diverticulosis with admission from 8/18 - 03/04/2005  . Esophageal stricture 03/03/2018  . Generalized abdominal pain 07/17/2015  . History of chemotherapy    S/P chemotherapy with Dr. Sonny Dandy -2006  . Hypertension   . Hypothyroid   . Osteoporosis    History of Osteoporosis with one year of Actonel only  . Radiation    S/P radiation thrapy -06/2005  . TIA (transient ischemic attack)    History of TIA's with no residual effect    Past Surgical History:  Procedure Laterality Date  . CARDIOVERSION N/A 11/23/2016   Procedure: CARDIOVERSION;  Surgeon: Pixie Casino, MD;  Location: [facility] ENDOSCOPY;  Service: Cardiovascular;  Laterality: N/A;  . CATARACT EXTRACTION, BILATERAL    . DIRECT LARYNGOSCOPY  03/27/2005   S/P direct laryngoscopy, esophagoscopy and biopsy with Dr. Constance Holster  . TOTAL ABDOMINAL HYSTERECTOMY W/ BILATERAL SALPINGOOPHORECTOMY  1986  . TUBAL LIGATION      FAMHx:  Family History  Problem Relation Age of Onset  . Stroke Mother   . Heart disease Mother 38  . Cancer Father 34       Prostate  . Alcohol abuse Brother   . COPD Brother     SOCHx:   reports that she has never smoked. She has never used smokeless tobacco. She reports that she does not drink alcohol or use drugs.  ALLERGIES:  Allergies  Allergen Reactions  . Tuberculin Tests Swelling  . Vit D-Vit E-Safflower Oil   . Paroxetine Hcl Rash    ROS: Pertinent items noted in HPI and remainder of comprehensive ROS otherwise negative.  HOME MEDS: Current Outpatient Medications on File Prior to Visit  Medication Sig Dispense Refill  . amLODipine (NORVASC) 2.5 MG tablet Take 1 tablet (2.5 mg  total) by mouth daily. 30 tablet 2  . B Complex-Folic Acid (B COMPLEX-VITAMIN B12 PO) Take by mouth.    . clotrimazole-betamethasone (LOTRISONE) cream APPLY TO AFFECTED AREA TWICE A DAY 30 g 6  . ELIQUIS 5 MG TABS tablet TAKE 1 TABLET BY MOUTH TWICE A DAY 60 tablet 5  . escitalopram (LEXAPRO) 10 MG tablet TAKE 1 TABLET BY MOUTH EVERY DAY 90 tablet 0  . famotidine (PEPCID) 20 MG tablet TAKE 1 TABLET (20 MG TOTAL) BY MOUTH EVERY 12 (TWELVE) HOURS. 60 tablet 3  . levothyroxine (SYNTHROID) 25 MCG tablet Take 1 tablet (25 mcg total) by mouth daily. 30 tablet 2  . metoprolol succinate (TOPROL-XL) 100 MG 24 hr tablet TAKE 1 TABLET BY MOUTH DAILY. TAKE WITH OR IMMEDIATELY FOLLOWING A MEAL. 90 tablet 1  . polyethylene glycol powder (GLYCOLAX/MIRALAX) powder Take 17  g by mouth 2 (two) times daily as needed for moderate constipation. For constipation (Patient taking differently: Take 17 g by mouth 2 (two) times daily. For constipation) 3350 g 5  . triamcinolone cream (KENALOG) 0.1 % APPLY TO AFFECTED AREA TWICE A DAY 30 g 0   Current Facility-Administered Medications on File Prior to Visit  Medication Dose Route Frequency Provider Last Rate Last Admin  . 0.9 %  sodium chloride infusion  500 mL Intravenous Once Irene Shipper, MD        LABS/IMAGING: No results found for this or any previous visit (from the past 48 hour(s)). No results found.  WEIGHTS: Wt Readings from Last 3 Encounters:  07/01/19 127 lb 9.6 oz (57.9 kg)  05/14/19 127 lb (57.6 kg)  10/17/18 127 lb (57.6 kg)    VITALS: BP (!) 183/79   Pulse 74   Temp (!) 97.2 F (36.2 C)   Ht 5\' 1"  (1.549 m)   Wt 127 lb 9.6 oz (57.9 kg)   SpO2 98%   BMI 24.11 kg/m   EXAM: General appearance: alert and no distress Neck: no carotid bruit and no JVD Lungs: clear to auscultation bilaterally Heart: regular rate and rhythm, S1, S2 normal, no murmur, click, rub or gallop Abdomen: soft, non-tender; bowel sounds normal; no masses,  no  organomegaly Extremities: extremities normal, atraumatic, no cyanosis or edema and varicose veins noted Pulses: 2+ and symmetric Skin: Skin color, texture, turgor normal. No rashes or lesions Neurologic: Grossly normal Psych: Pleasant  EKG: A. fib at 74, incomplete right bundle branch block and left anterior fascicular block-personally reviewed  ASSESSMENT: 1. Persistent atrial fibrillation-successful DCCV, however there was recurrence 2. CHADSVASC score of 4-on Eliquis 3. Hypertension 4. Venous insufficiency  PLAN: 1.   Mrs. Springston denies any symptoms with her A. fib and has had no bleeding difficulty on Eliquis.  Blood pressure at home is better controlled but typically is elevated in the office.  She does have lower extremity swelling which is likely secondary to venous insufficiency and intermittently wears compression stockings.  No changes to her medicines today.  Follow-up annually or sooner as necessary.  Pixie Casino, MD, [facility] Director of the [facility] &  Card[facility] Attending Cardiologist  Direct Dial: [phone]  Fax: [phone]  Website:  [email or web address] Repetto 07/01/2019, 2:07 PM

## 2019-07-21 DIAGNOSIS — Z923 Personal history of irradiation: Secondary | ICD-10-CM | POA: Diagnosis not present

## 2019-07-21 DIAGNOSIS — K117 Disturbances of salivary secretion: Secondary | ICD-10-CM | POA: Diagnosis not present

## 2019-07-21 DIAGNOSIS — Z8521 Personal history of malignant neoplasm of larynx: Secondary | ICD-10-CM | POA: Diagnosis not present

## 2019-08-08 ENCOUNTER — Other Ambulatory Visit: Payer: Self-pay | Admitting: Family Medicine

## 2019-08-11 ENCOUNTER — Encounter (HOSPITAL_COMMUNITY): Payer: Self-pay | Admitting: Dentistry

## 2019-09-09 ENCOUNTER — Other Ambulatory Visit: Payer: Self-pay | Admitting: Family Medicine

## 2019-09-29 ENCOUNTER — Other Ambulatory Visit: Payer: Self-pay

## 2019-09-29 ENCOUNTER — Encounter: Payer: Self-pay | Admitting: Family Medicine

## 2019-09-29 ENCOUNTER — Ambulatory Visit (INDEPENDENT_AMBULATORY_CARE_PROVIDER_SITE_OTHER): Payer: Medicare Other | Admitting: Family Medicine

## 2019-09-29 VITALS — BP 130/86 | HR 75 | Temp 98.0°F | Ht 61.0 in | Wt 126.2 lb

## 2019-09-29 DIAGNOSIS — F419 Anxiety disorder, unspecified: Secondary | ICD-10-CM | POA: Diagnosis not present

## 2019-09-29 DIAGNOSIS — E039 Hypothyroidism, unspecified: Secondary | ICD-10-CM

## 2019-09-29 DIAGNOSIS — R1314 Dysphagia, pharyngoesophageal phase: Secondary | ICD-10-CM

## 2019-09-29 DIAGNOSIS — E559 Vitamin D deficiency, unspecified: Secondary | ICD-10-CM

## 2019-09-29 DIAGNOSIS — R21 Rash and other nonspecific skin eruption: Secondary | ICD-10-CM | POA: Diagnosis not present

## 2019-09-29 DIAGNOSIS — I10 Essential (primary) hypertension: Secondary | ICD-10-CM

## 2019-09-29 DIAGNOSIS — I4891 Unspecified atrial fibrillation: Secondary | ICD-10-CM

## 2019-09-29 DIAGNOSIS — Z7901 Long term (current) use of anticoagulants: Secondary | ICD-10-CM

## 2019-09-29 DIAGNOSIS — E782 Mixed hyperlipidemia: Secondary | ICD-10-CM

## 2019-09-29 MED ORDER — TRIAMCINOLONE ACETONIDE 0.1 % EX CREA: TOPICAL_CREAM | CUTANEOUS | 1 refills | Status: DC

## 2019-09-29 MED ORDER — FAMOTIDINE 20 MG PO TABS: 20.0000 mg | ORAL_TABLET | Freq: Two times a day (BID) | ORAL | 1 refills | Status: DC

## 2019-09-29 MED ORDER — APIXABAN 5 MG PO TABS: 5.0000 mg | ORAL_TABLET | Freq: Two times a day (BID) | ORAL | 1 refills | Status: DC

## 2019-09-29 MED ORDER — LEVOTHYROXINE SODIUM 25 MCG PO TABS: 25.0000 ug | ORAL_TABLET | Freq: Every day | ORAL | 2 refills | Status: DC

## 2019-09-29 MED ORDER — AMLODIPINE BESYLATE 2.5 MG PO TABS: 2.5000 mg | ORAL_TABLET | Freq: Every day | ORAL | 1 refills | Status: DC

## 2019-09-29 MED ORDER — CLOTRIMAZOLE-BETAMETHASONE 1-0.05 % EX CREA: TOPICAL_CREAM | CUTANEOUS | 2 refills | Status: DC

## 2019-09-29 MED ORDER — METOPROLOL SUCCINATE ER 100 MG PO TB24: ORAL_TABLET | ORAL | 1 refills | Status: DC

## 2019-09-29 MED ORDER — ESCITALOPRAM OXALATE 10 MG PO TABS: 10.0000 mg | ORAL_TABLET | Freq: Every day | ORAL | 1 refills | Status: DC

## 2019-09-29 NOTE — Progress Notes (Signed)
Subjective:  Patient ID: Crystal Browning, female    DOB: [birth year]  Age: 86 y.o. MRN: [id number]  CC: Follow-up (6 month)   HPI Crystal Browning presents for  follow-up of hypertension. Patient has no history of headache chest pain or shortness of breath or recent cough. Patient also denies symptoms of TIA such as focal numbness or weakness. Patient denies side effects from medication. States taking it regularly.  follow-up on  thyroid. The patient has a history of hypothyroidism for many years. It has been stable recently. Pt. denies any change in  voice, loss of hair, heat or cold intolerance. Energy level has been adequate to good. Patient denies constipation and diarrhea. No myxedema. Medication is as noted below. Verified that pt is taking it daily on an empty stomach. Well tolerated. Patient continues to have xerostomia and some difficulty swallowing due to the history of external beam radiation therapy.  This was due to laryngeal cancer.  No evidence for recurrence.   Patient in for follow-up of atrial fibrillation. Patient denies any recent bouts of chest pain or palpitations. Additionally, patient is taking anticoagulants. Patient denies any recent excessive bleeding episodes including epistaxis, bleeding from the gums, genitalia, rectal bleeding or hematuria. Additionally there has been no excessive bruising.   History Heer has a past medical history of Bilateral lower extremity edema (10/30/2016), Cancer ([place]) (03/28/11), Depression, Diverticulosis (02/2005), Esophageal stricture (03/03/2018), Generalized abdominal pain (07/17/2015), History of chemotherapy, Hypertension, Hypothyroid, Osteoporosis, Radiation, and TIA (transient ischemic attack).   She has a past surgical history that includes Direct laryngoscopy (03/27/2005); Tubal ligation; Total abdominal hysterectomy w/ bilateral salpingoophorectomy (1986); Cataract extraction, bilateral; and Cardioversion (N/A, 11/23/2016).   Her family  history includes Alcohol abuse in her brother; COPD in her brother; Cancer (age of onset: 80) in her father; Heart disease (age of onset: 11) in her mother; Stroke in her mother.She reports that she has never smoked. She has never used smokeless tobacco. She reports that she does not drink alcohol or use drugs.  Current Outpatient Medications on File Prior to Visit  Medication Sig Dispense Refill  . polyethylene glycol powder (GLYCOLAX/MIRALAX) powder Take 17 g by mouth 2 (two) times daily as needed for moderate constipation. For constipation (Patient taking differently: Take 17 g by mouth 2 (two) times daily. For constipation) 3350 g 5  . B Complex-Folic Acid (B COMPLEX-VITAMIN B12 PO) Take by mouth.     Current Facility-Administered Medications on File Prior to Visit  Medication Dose Route Frequency Provider Last Rate Last Admin  . 0.9 %  sodium chloride infusion  500 mL Intravenous Once Irene Shipper, MD        ROS Review of Systems  Constitutional: Negative.   HENT: Negative for congestion.   Eyes: Negative for visual disturbance.  Respiratory: Negative for shortness of breath.   Cardiovascular: Negative for chest pain.  Gastrointestinal: Negative for abdominal pain, constipation, diarrhea, nausea and vomiting.  Genitourinary: Negative for difficulty urinating.  Musculoskeletal: Negative for arthralgias and myalgias.  Neurological: Negative for headaches.  Psychiatric/Behavioral: Negative for sleep disturbance.    Objective:  BP 130/86   Pulse 75   Temp 98 F (36.7 C) (Temporal)   Ht _0  (1.549 m)   Wt 126 lb 3.2 oz (57.2 kg)   BMI 23.85 kg/m   BP Readings from Last 3 Encounters:  09/29/19 130/86  07/01/19 (!) 183/79  05/14/19 (!) 172/71    Wt Readings from Last 3 Encounters:  09/29/19 126  lb 3.2 oz (57.2 kg)  07/01/19 127 lb 9.6 oz (57.9 kg)  05/14/19 127 lb (57.6 kg)     Physical Exam Constitutional:      General: She is not in acute distress.     Appearance: She is well-developed.  HENT:     Head: Normocephalic and atraumatic.  Eyes:     Conjunctiva/sclera: Conjunctivae normal.     Pupils: Pupils are equal, round, and reactive to light.  Neck:     Thyroid: No thyromegaly.  Cardiovascular:     Rate and Rhythm: Normal rate and regular rhythm.     Heart sounds: Normal heart sounds. No murmur.  Pulmonary:     Effort: Pulmonary effort is normal. No respiratory distress.     Breath sounds: Normal breath sounds. No wheezing or rales.  Abdominal:     General: Bowel sounds are normal. There is no distension.     Palpations: Abdomen is soft.     Tenderness: There is no abdominal tenderness.  Musculoskeletal:        General: Normal range of motion.     Cervical back: Normal range of motion and neck supple.  Lymphadenopathy:     Cervical: No cervical adenopathy.  Skin:    General: Skin is warm and dry.  Neurological:     Mental Status: She is alert and oriented to person, place, and time.  Psychiatric:        Behavior: Behavior normal.        Thought Content: Thought content normal.        Judgment: Judgment normal.       Assessment & Plan:   Jin was seen today for follow-up.  Diagnoses and all orders for this visit:  Essential hypertension -     CBC with Differential/Platelet -     CMP14+EGFR -     amLODipine (NORVASC) 2.5 MG tablet; Take 1 tablet (2.5 mg total) by mouth daily. -     metoprolol succinate (TOPROL-XL) 100 MG 24 hr tablet; Take with or immediately following a meal.  Vitamin D deficiency -     VITAMIN D 25 Hydroxy (Vit-D Deficiency, Fractures)  Hypothyroidism, unspecified type -     CBC with Differential/Platelet -     CMP14+EGFR -     TSH + free T4 -     levothyroxine (SYNTHROID) 25 MCG tablet; Take 1 tablet (25 mcg total) by mouth daily.  Atrial fibrillation, unspecified type ([place]) -     CBC with Differential/Platelet -     CMP14+EGFR -     apixaban (ELIQUIS) 5 MG TABS tablet; Take 1 tablet (5  mg total) by mouth 2 (two) times daily. -     metoprolol succinate (TOPROL-XL) 100 MG 24 hr tablet; Take with or immediately following a meal.  Long term (current) use of anticoagulants -     CBC with Differential/Platelet -     CMP14+EGFR  Pharyngoesophageal dysphagia -     CBC with Differential/Platelet -     CMP14+EGFR -     famotidine (PEPCID) 20 MG tablet; Take 1 tablet (20 mg total) by mouth every 12 (twelve) hours.  Mixed hyperlipidemia -     Lipid panel  Rash -     clotrimazole-betamethasone (LOTRISONE) cream; APPLY TO AFFECTED AREA TWICE A DAY -     triamcinolone cream (KENALOG) 0.1 %; APPLY TO AFFECTED AREA TWICE A DAY  Anxiety -     escitalopram (LEXAPRO) 10 MG tablet; Take 1 tablet (10 mg  total) by mouth daily.   Allergies as of 09/29/2019      Reactions   Tuberculin Tests Swelling   Vit D-vit E-safflower Oil    Paroxetine Hcl Rash      Medication List       Accurate as of September 29, 2019  4:10 PM. If you have any questions, ask your nurse or doctor.        amLODipine 2.5 MG tablet Commonly known as: NORVASC Take 1 tablet (2.5 mg total) by mouth daily.   apixaban 5 MG Tabs tablet Commonly known as: Eliquis Take 1 tablet (5 mg total) by mouth 2 (two) times daily. What changed: how much to take Changed by: [name], MD   B COMPLEX-VITAMIN B12 PO Take by mouth.   clotrimazole-betamethasone cream Commonly known as: LOTRISONE APPLY TO AFFECTED AREA TWICE A DAY   escitalopram 10 MG tablet Commonly known as: LEXAPRO Take 1 tablet (10 mg total) by mouth daily.   famotidine 20 MG tablet Commonly known as: PEPCID Take 1 tablet (20 mg total) by mouth every 12 (twelve) hours.   levothyroxine 25 MCG tablet Commonly known as: SYNTHROID Take 1 tablet (25 mcg total) by mouth daily.   metoprolol succinate 100 MG 24 hr tablet Commonly known as: TOPROL-XL Take with or immediately following a meal. What changed: See the new instructions. Changed by:  [name], MD   polyethylene glycol powder 17 GM/SCOOP powder Commonly known as: GLYCOLAX/MIRALAX Take 17 g by mouth 2 (two) times daily as needed for moderate constipation. For constipation What changed: when to take this   triamcinolone cream 0.1 % Commonly known as: KENALOG APPLY TO AFFECTED AREA TWICE A DAY       Meds ordered this encounter  Medications  . amLODipine (NORVASC) 2.5 MG tablet    Sig: Take 1 tablet (2.5 mg total) by mouth daily.    Dispense:  90 tablet    Refill:  1  . clotrimazole-betamethasone (LOTRISONE) cream    Sig: APPLY TO AFFECTED AREA TWICE A DAY    Dispense:  45 g    Refill:  2  . apixaban (ELIQUIS) 5 MG TABS tablet    Sig: Take 1 tablet (5 mg total) by mouth 2 (two) times daily.    Dispense:  180 tablet    Refill:  1  . escitalopram (LEXAPRO) 10 MG tablet    Sig: Take 1 tablet (10 mg total) by mouth daily.    Dispense:  90 tablet    Refill:  1  . famotidine (PEPCID) 20 MG tablet    Sig: Take 1 tablet (20 mg total) by mouth every 12 (twelve) hours.    Dispense:  180 tablet    Refill:  1  . levothyroxine (SYNTHROID) 25 MCG tablet    Sig: Take 1 tablet (25 mcg total) by mouth daily.    Dispense:  30 tablet    Refill:  2  . metoprolol succinate (TOPROL-XL) 100 MG 24 hr tablet    Sig: Take with or immediately following a meal.    Dispense:  90 tablet    Refill:  1  . triamcinolone cream (KENALOG) 0.1 %    Sig: APPLY TO AFFECTED AREA TWICE A DAY    Dispense:  80 g    Refill:  1    With regard to thyroid, hypertension, atrial fibrillation the patient is quite stable.  She still having some trouble with dysphagia from her history of radiation.  The Pepcid seems  to help and set is drinking water.  However she has trouble handling bread and some other rather gummy foods.  Her skin is parchmentlike and it does break out easily for that she has prescription of Kenalog and for the areas in the vulvar region, Lotrisone.  Follow-up: Return in  about 6 months (around 03/31/2020).  [name], M.D.

## 2019-09-30 ENCOUNTER — Other Ambulatory Visit: Payer: Self-pay | Admitting: Family Medicine

## 2019-09-30 ENCOUNTER — Telehealth: Payer: Self-pay | Admitting: Family Medicine

## 2019-09-30 DIAGNOSIS — E039 Hypothyroidism, unspecified: Secondary | ICD-10-CM

## 2019-09-30 LAB — CMP14+EGFR
ALT: 19 IU/L (ref 0–32)
AST: 33 IU/L (ref 0–40)
Albumin/Globulin Ratio: 1.9 (ref 1.2–2.2)
Albumin: 4.4 g/dL (ref 3.6–4.6)
Alkaline Phosphatase: 82 IU/L (ref 39–117)
BUN/Creatinine Ratio: 11 — ABNORMAL LOW (ref 12–28)
BUN: 9 mg/dL (ref 8–27)
Bilirubin Total: 0.5 mg/dL (ref 0.0–1.2)
CO2: 26 mmol/L (ref 20–29)
Calcium: 9.8 mg/dL (ref 8.7–10.3)
Chloride: 104 mmol/L (ref 96–106)
Creatinine, Ser: 0.85 mg/dL (ref 0.57–1.00)
GFR calc Af Amer: 73 mL/min/{1.73_m2} (ref >59)
GFR calc non Af Amer: 64 mL/min/{1.73_m2} (ref >59)
Globulin, Total: 2.3 g/dL (ref 1.5–4.5)
Glucose: 84 mg/dL (ref 65–99)
Potassium: 3.9 mmol/L (ref 3.5–5.2)
Sodium: 142 mmol/L (ref 134–144)
Total Protein: 6.7 g/dL (ref 6.0–8.5)

## 2019-09-30 LAB — CBC WITH DIFFERENTIAL/PLATELET
Basophils Absolute: 0 10*3/uL (ref 0.0–0.2)
Basos: 0 %
EOS (ABSOLUTE): 0.1 10*3/uL (ref 0.0–0.4)
Eos: 3 %
Hematocrit: 39.2 % (ref 34.0–46.6)
Hemoglobin: 12.9 g/dL (ref 11.1–15.9)
Immature Grans (Abs): 0 10*3/uL (ref 0.0–0.1)
Immature Granulocytes: 0 %
Lymphocytes Absolute: 1.4 10*3/uL (ref 0.7–3.1)
Lymphs: 31 %
MCH: 31.5 pg (ref 26.6–33.0)
MCHC: 32.9 g/dL (ref 31.5–35.7)
MCV: 96 fL (ref 79–97)
Monocytes Absolute: 0.4 10*3/uL (ref 0.1–0.9)
Monocytes: 10 %
Neutrophils Absolute: 2.4 10*3/uL (ref 1.4–7.0)
Neutrophils: 56 %
Platelets: 206 10*3/uL (ref 150–450)
RBC: 4.09 x10E6/uL (ref 3.77–5.28)
RDW: 12.7 % (ref 11.7–15.4)
WBC: 4.3 10*3/uL (ref 3.4–10.8)

## 2019-09-30 LAB — TSH+FREE T4
Free T4: 1.1 ng/dL (ref 0.82–1.77)
TSH: 4.65 u[IU]/mL — ABNORMAL HIGH (ref 0.450–4.500)

## 2019-09-30 LAB — LIPID PANEL
Chol/HDL Ratio: 3.4 ratio (ref 0.0–4.4)
Cholesterol, Total: 183 mg/dL (ref 100–199)
HDL: 54 mg/dL (ref >39)
LDL Chol Calc (NIH): 111 mg/dL — ABNORMAL HIGH (ref 0–99)
Triglycerides: 99 mg/dL (ref 0–149)
VLDL Cholesterol Cal: 18 mg/dL (ref 5–40)

## 2019-09-30 LAB — VITAMIN D 25 HYDROXY (VIT D DEFICIENCY, FRACTURES): Vit D, 25-Hydroxy: 14.7 ng/mL — ABNORMAL LOW (ref 30.0–100.0)

## 2019-09-30 MED ORDER — VITAMIN D (ERGOCALCIFEROL) 1.25 MG (50000 UNIT) PO CAPS: 50000.0000 [IU] | ORAL_CAPSULE | ORAL | 3 refills | Status: DC

## 2019-09-30 MED ORDER — LEVOTHYROXINE SODIUM 50 MCG PO TABS: 50.0000 ug | ORAL_TABLET | Freq: Every day | ORAL | 1 refills | Status: DC

## 2019-09-30 NOTE — Telephone Encounter (Signed)
Patient's sister aware of results and instructions.

## 2019-10-04 ENCOUNTER — Inpatient Hospital Stay (HOSPITAL_COMMUNITY)
Admission: EM | Admit: 2019-10-04 | Discharge: 2019-10-07 | DRG: 871 | Disposition: A | Discharge status: Home Health | Payer: Medicare Other | Attending: Family Medicine | Admitting: Family Medicine

## 2019-10-04 ENCOUNTER — Encounter (HOSPITAL_COMMUNITY): Payer: Self-pay | Admitting: Emergency Medicine

## 2019-10-04 ENCOUNTER — Emergency Department (HOSPITAL_COMMUNITY): Payer: Medicare Other

## 2019-10-04 ENCOUNTER — Other Ambulatory Visit: Payer: Self-pay

## 2019-10-04 DIAGNOSIS — I4891 Unspecified atrial fibrillation: Secondary | ICD-10-CM | POA: Diagnosis not present

## 2019-10-04 DIAGNOSIS — Z888 Allergy status to other drugs, medicaments and biological substances status: Secondary | ICD-10-CM

## 2019-10-04 DIAGNOSIS — Z923 Personal history of irradiation: Secondary | ICD-10-CM

## 2019-10-04 DIAGNOSIS — L299 Pruritus, unspecified: Secondary | ICD-10-CM | POA: Diagnosis present

## 2019-10-04 DIAGNOSIS — M810 Age-related osteoporosis without current pathological fracture: Secondary | ICD-10-CM | POA: Diagnosis present

## 2019-10-04 DIAGNOSIS — I10 Essential (primary) hypertension: Secondary | ICD-10-CM | POA: Diagnosis present

## 2019-10-04 DIAGNOSIS — K219 Gastro-esophageal reflux disease without esophagitis: Secondary | ICD-10-CM | POA: Diagnosis present

## 2019-10-04 DIAGNOSIS — Z79899 Other long term (current) drug therapy: Secondary | ICD-10-CM | POA: Diagnosis not present

## 2019-10-04 DIAGNOSIS — I482 Chronic atrial fibrillation, unspecified: Secondary | ICD-10-CM | POA: Diagnosis present

## 2019-10-04 DIAGNOSIS — Z9221 Personal history of antineoplastic chemotherapy: Secondary | ICD-10-CM | POA: Diagnosis not present

## 2019-10-04 DIAGNOSIS — Z66 Do not resuscitate: Secondary | ICD-10-CM | POA: Diagnosis present

## 2019-10-04 DIAGNOSIS — Z20822 Contact with and (suspected) exposure to covid-19: Secondary | ICD-10-CM | POA: Diagnosis not present

## 2019-10-04 DIAGNOSIS — Z8673 Personal history of transient ischemic attack (TIA), and cerebral infarction without residual deficits: Secondary | ICD-10-CM

## 2019-10-04 DIAGNOSIS — F329 Major depressive disorder, single episode, unspecified: Secondary | ICD-10-CM | POA: Diagnosis present

## 2019-10-04 DIAGNOSIS — R6520 Severe sepsis without septic shock: Secondary | ICD-10-CM | POA: Diagnosis not present

## 2019-10-04 DIAGNOSIS — Z7901 Long term (current) use of anticoagulants: Secondary | ICD-10-CM

## 2019-10-04 DIAGNOSIS — Z23 Encounter for immunization: Secondary | ICD-10-CM | POA: Diagnosis not present

## 2019-10-04 DIAGNOSIS — Z8521 Personal history of malignant neoplasm of larynx: Secondary | ICD-10-CM | POA: Diagnosis not present

## 2019-10-04 DIAGNOSIS — J189 Pneumonia, unspecified organism: Secondary | ICD-10-CM | POA: Diagnosis not present

## 2019-10-04 DIAGNOSIS — Z79890 Hormone replacement therapy (postmenopausal): Secondary | ICD-10-CM

## 2019-10-04 DIAGNOSIS — I4811 Longstanding persistent atrial fibrillation: Secondary | ICD-10-CM

## 2019-10-04 DIAGNOSIS — R918 Other nonspecific abnormal finding of lung field: Secondary | ICD-10-CM | POA: Diagnosis not present

## 2019-10-04 DIAGNOSIS — J90 Pleural effusion, not elsewhere classified: Secondary | ICD-10-CM | POA: Diagnosis not present

## 2019-10-04 DIAGNOSIS — E039 Hypothyroidism, unspecified: Secondary | ICD-10-CM | POA: Diagnosis present

## 2019-10-04 DIAGNOSIS — Z887 Allergy status to serum and vaccine status: Secondary | ICD-10-CM | POA: Diagnosis not present

## 2019-10-04 DIAGNOSIS — J9601 Acute respiratory failure with hypoxia: Secondary | ICD-10-CM | POA: Diagnosis present

## 2019-10-04 DIAGNOSIS — Z8249 Family history of ischemic heart disease and other diseases of the circulatory system: Secondary | ICD-10-CM

## 2019-10-04 DIAGNOSIS — A419 Sepsis, unspecified organism: Principal | ICD-10-CM | POA: Diagnosis present

## 2019-10-04 DIAGNOSIS — R1310 Dysphagia, unspecified: Secondary | ICD-10-CM | POA: Diagnosis not present

## 2019-10-04 DIAGNOSIS — J440 Chronic obstructive pulmonary disease with acute lower respiratory infection: Secondary | ICD-10-CM | POA: Diagnosis present

## 2019-10-04 DIAGNOSIS — R1312 Dysphagia, oropharyngeal phase: Secondary | ICD-10-CM | POA: Diagnosis present

## 2019-10-04 DIAGNOSIS — Z91018 Allergy to other foods: Secondary | ICD-10-CM

## 2019-10-04 DIAGNOSIS — R52 Pain, unspecified: Secondary | ICD-10-CM | POA: Diagnosis not present

## 2019-10-04 DIAGNOSIS — Z823 Family history of stroke: Secondary | ICD-10-CM

## 2019-10-04 DIAGNOSIS — R0689 Other abnormalities of breathing: Secondary | ICD-10-CM | POA: Diagnosis not present

## 2019-10-04 DIAGNOSIS — R0902 Hypoxemia: Secondary | ICD-10-CM | POA: Diagnosis not present

## 2019-10-04 HISTORY — DX: Pneumonia, unspecified organism: J18.9

## 2019-10-04 LAB — CBC WITH DIFFERENTIAL/PLATELET
Abs Immature Granulocytes: 0.11 10*3/uL — ABNORMAL HIGH (ref 0.00–0.07)
Basophils Absolute: 0 10*3/uL (ref 0.0–0.1)
Basophils Relative: 0 %
Eosinophils Absolute: 0.1 10*3/uL (ref 0.0–0.5)
Eosinophils Relative: 0 %
HCT: 41.5 % (ref 36.0–46.0)
Hemoglobin: 13.6 g/dL (ref 12.0–15.0)
Immature Granulocytes: 1 %
Lymphocytes Relative: 4 %
Lymphs Abs: 0.6 10*3/uL — ABNORMAL LOW (ref 0.7–4.0)
MCH: 32.4 pg (ref 26.0–34.0)
MCHC: 32.8 g/dL (ref 30.0–36.0)
MCV: 98.8 fL (ref 80.0–100.0)
Monocytes Absolute: 0.7 10*3/uL (ref 0.1–1.0)
Monocytes Relative: 5 %
Neutro Abs: 13.2 10*3/uL — ABNORMAL HIGH (ref 1.7–7.7)
Neutrophils Relative %: 90 %
Platelets: 192 10*3/uL (ref 150–400)
RBC: 4.2 MIL/uL (ref 3.87–5.11)
RDW: 13.5 % (ref 11.5–15.5)
WBC: 14.7 10*3/uL — ABNORMAL HIGH (ref 4.0–10.5)
nRBC: 0 % (ref 0.0–0.2)

## 2019-10-04 LAB — COMPREHENSIVE METABOLIC PANEL
ALT: 32 U/L (ref 0–44)
AST: 62 U/L — ABNORMAL HIGH (ref 15–41)
Albumin: 4.4 g/dL (ref 3.5–5.0)
Alkaline Phosphatase: 81 U/L (ref 38–126)
Anion gap: 13 (ref 5–15)
BUN: 10 mg/dL (ref 8–23)
CO2: 21 mmol/L — ABNORMAL LOW (ref 22–32)
Calcium: 9 mg/dL (ref 8.9–10.3)
Chloride: 103 mmol/L (ref 98–111)
Creatinine, Ser: 0.74 mg/dL (ref 0.44–1.00)
GFR calc Af Amer: >60 mL/min (ref >60)
GFR calc non Af Amer: >60 mL/min (ref >60)
Glucose, Bld: 136 mg/dL — ABNORMAL HIGH (ref 70–99)
Potassium: 3.7 mmol/L (ref 3.5–5.1)
Sodium: 137 mmol/L (ref 135–145)
Total Bilirubin: 0.9 mg/dL (ref 0.3–1.2)
Total Protein: 7.8 g/dL (ref 6.5–8.1)

## 2019-10-04 LAB — RESPIRATORY PANEL BY RT PCR (FLU A&B, COVID)
Influenza A by PCR: NEGATIVE
Influenza B by PCR: NEGATIVE
SARS Coronavirus 2 by RT PCR: NEGATIVE

## 2019-10-04 LAB — APTT: aPTT: 30 seconds (ref 24–36)

## 2019-10-04 LAB — BRAIN NATRIURETIC PEPTIDE: B Natriuretic Peptide: 226 pg/mL — ABNORMAL HIGH (ref 0.0–100.0)

## 2019-10-04 LAB — TYPE AND SCREEN
ABO/RH(D): A POS
Antibody Screen: NEGATIVE

## 2019-10-04 LAB — LACTIC ACID, PLASMA
Lactic Acid, Venous: 1.5 mmol/L (ref 0.5–1.9)
Lactic Acid, Venous: 1.6 mmol/L (ref 0.5–1.9)

## 2019-10-04 LAB — POC OCCULT BLOOD, ED: Fecal Occult Bld: NEGATIVE

## 2019-10-04 LAB — PROTIME-INR
INR: 1.3 — ABNORMAL HIGH (ref 0.8–1.2)
Prothrombin Time: 15.6 seconds — ABNORMAL HIGH (ref 11.4–15.2)

## 2019-10-04 IMAGING — DX DG CHEST 1V PORT
1 series · 1 of 1 positions shown · non-contrast
Comparison: Chest radiograph [DATE]

CLINICAL DATA: Patient complains of nausea/chills. Code sepsis

EXAM:
PORTABLE CHEST 1 VIEW

[chest ap]
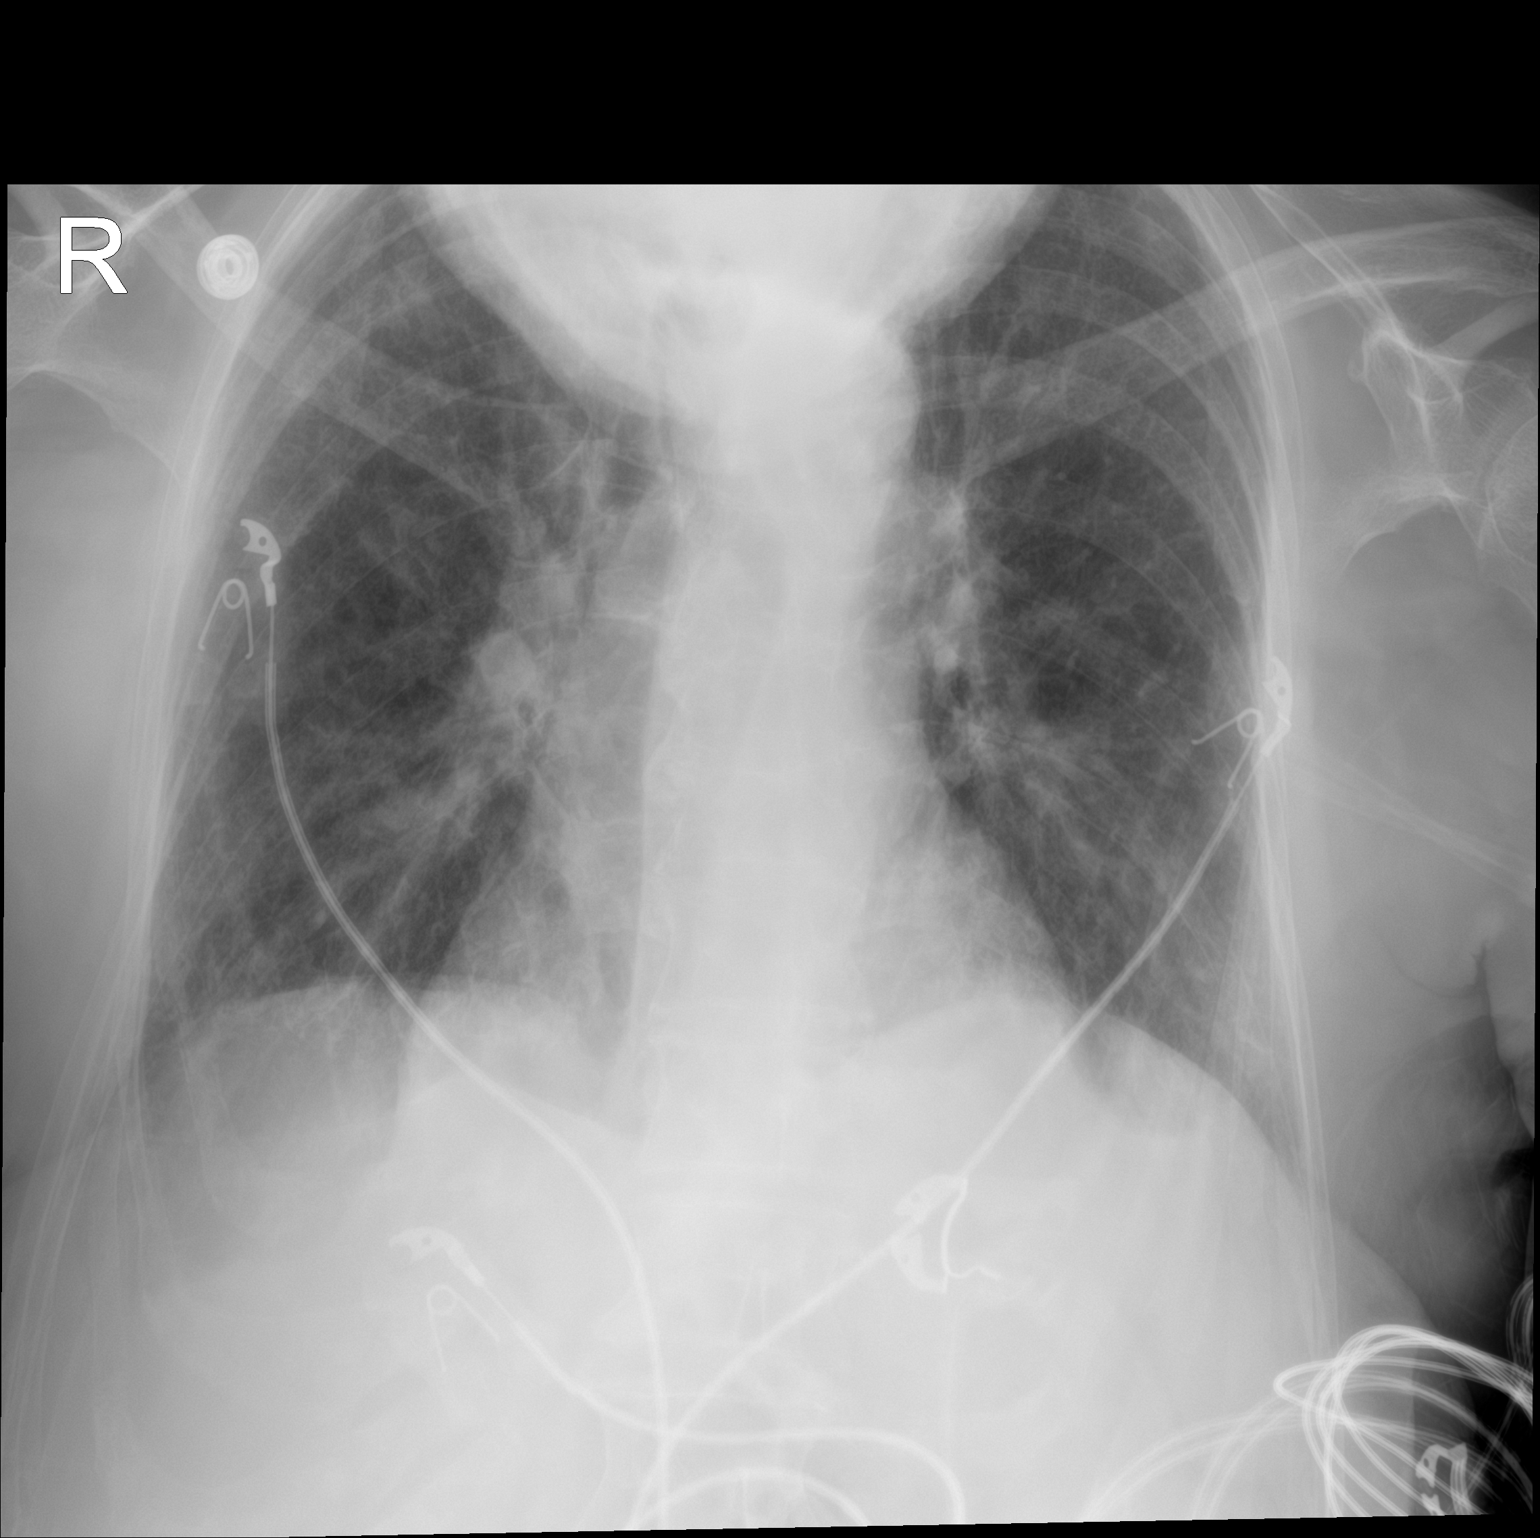

[1 of 1 positions shown; findings below may reference images not displayed]

FINDINGS: Limited exam due to patient chin projecting over the upper chest.

Visualized cardiomediastinal contours appear stable. Low lung
volumes. Diffuse bilateral coarse interstitial opacities. No
definite pneumothorax or significant pleural effusion. No acute
finding in the visualized skeleton.
IMPRESSION: Diffuse bilateral coarse interstitial opacities could represent
edema or atypical (viral) infection in the acute setting, versus
progression of chronic bronchitic changes and scarring.

## 2019-10-04 MED ORDER — POLYETHYLENE GLYCOL 3350 17 GM/SCOOP PO POWD
17.0000 g | Freq: Two times a day (BID) | ORAL | Status: DC
  Filled 2019-10-04: qty 255

## 2019-10-04 MED ORDER — LEVOFLOXACIN IN D5W 500 MG/100ML IV SOLN
500.0000 mg | Freq: Once | INTRAVENOUS | Status: AC
  Administered 2019-10-04: 18:00:00 500 mg via INTRAVENOUS
  Filled 2019-10-04: qty 100

## 2019-10-04 MED ORDER — METOPROLOL SUCCINATE ER 25 MG PO TB24
100.0000 mg | ORAL_TABLET | Freq: Every day | ORAL | Status: DC
  Administered 2019-10-05 – 2019-10-07 (×3): 100 mg via ORAL
  Filled 2019-10-04 (×3): qty 4

## 2019-10-04 MED ORDER — SODIUM CHLORIDE 0.9 % IV SOLN
2.0000 g | Freq: Every day | INTRAVENOUS | Status: DC
  Administered 2019-10-04 – 2019-10-06 (×3): 2 g via INTRAVENOUS
  Filled 2019-10-04 (×3): qty 20

## 2019-10-04 MED ORDER — SODIUM CHLORIDE 0.9% FLUSH
3.0000 mL | Freq: Two times a day (BID) | INTRAVENOUS | Status: DC
  Administered 2019-10-04 – 2019-10-07 (×5): 3 mL via INTRAVENOUS

## 2019-10-04 MED ORDER — PANTOPRAZOLE SODIUM 40 MG IV SOLR
40.0000 mg | Freq: Once | INTRAVENOUS | Status: AC
  Administered 2019-10-04: 18:00:00 40 mg via INTRAVENOUS
  Filled 2019-10-04: qty 40

## 2019-10-04 MED ORDER — FAMOTIDINE 20 MG PO TABS
20.0000 mg | ORAL_TABLET | Freq: Two times a day (BID) | ORAL | Status: DC
  Administered 2019-10-04 – 2019-10-07 (×6): 20 mg via ORAL
  Filled 2019-10-04 (×6): qty 1

## 2019-10-04 MED ORDER — ESCITALOPRAM OXALATE 10 MG PO TABS
10.0000 mg | ORAL_TABLET | Freq: Every day | ORAL | Status: DC
  Administered 2019-10-05 – 2019-10-07 (×3): 10 mg via ORAL
  Filled 2019-10-04 (×3): qty 1

## 2019-10-04 MED ORDER — APIXABAN 5 MG PO TABS
5.0000 mg | ORAL_TABLET | Freq: Two times a day (BID) | ORAL | Status: DC
  Administered 2019-10-04 – 2019-10-07 (×6): 5 mg via ORAL
  Filled 2019-10-04 (×6): qty 1

## 2019-10-04 MED ORDER — INFLUENZA VAC A&B SA ADJ QUAD 0.5 ML IM PRSY
0.5000 mL | PREFILLED_SYRINGE | INTRAMUSCULAR | Status: AC
  Administered 2019-10-05: 10:00:00 0.5 mL via INTRAMUSCULAR
  Filled 2019-10-04: qty 0.5

## 2019-10-04 MED ORDER — SODIUM CHLORIDE 0.9 % IV SOLN: 250.0000 mL | INTRAVENOUS | Status: DC | PRN

## 2019-10-04 MED ORDER — AMLODIPINE BESYLATE 5 MG PO TABS
2.5000 mg | ORAL_TABLET | Freq: Every day | ORAL | Status: DC
  Administered 2019-10-05 – 2019-10-07 (×3): 2.5 mg via ORAL
  Filled 2019-10-04 (×3): qty 1

## 2019-10-04 MED ORDER — SODIUM CHLORIDE 0.9 % IV BOLUS
1000.0000 mL | Freq: Once | INTRAVENOUS | Status: AC
  Administered 2019-10-04: 18:00:00 1000 mL via INTRAVENOUS

## 2019-10-04 MED ORDER — LEVOTHYROXINE SODIUM 50 MCG PO TABS
50.0000 ug | ORAL_TABLET | Freq: Every day | ORAL | Status: DC
  Administered 2019-10-05 – 2019-10-07 (×3): 50 ug via ORAL
  Filled 2019-10-04 (×3): qty 1

## 2019-10-04 MED ORDER — AZITHROMYCIN 250 MG PO TABS
500.0000 mg | ORAL_TABLET | Freq: Every day | ORAL | Status: DC
  Administered 2019-10-05 – 2019-10-07 (×3): 500 mg via ORAL
  Filled 2019-10-04 (×3): qty 2

## 2019-10-04 MED ORDER — SODIUM CHLORIDE 0.9% FLUSH: 3.0000 mL | INTRAVENOUS | Status: DC | PRN

## 2019-10-04 MED ORDER — PNEUMOCOCCAL VAC POLYVALENT 25 MCG/0.5ML IJ INJ
0.5000 mL | INJECTION | INTRAMUSCULAR | Status: AC
  Administered 2019-10-05: 10:00:00 0.5 mL via INTRAMUSCULAR
  Filled 2019-10-04: qty 0.5

## 2019-10-04 NOTE — ED Triage Notes (Signed)
Per EMS patient from home. Patient complains of nausea/chills.  Patient spitting up coffee ground emesis.

## 2019-10-04 NOTE — ED Notes (Signed)
Family to bedside.

## 2019-10-04 NOTE — ED Notes (Signed)
Dr Linda Hedges in room talking to patient and family

## 2019-10-04 NOTE — ED Notes (Signed)
Pt hasn't ate since breakfast. Pt given sandwich

## 2019-10-04 NOTE — ED Notes (Signed)
Hospitalist notified of family wanting to see him

## 2019-10-04 NOTE — H&P (Signed)
History and Physical    Crystal Browning [id number] DOB: Apr 19, 1934 DOA: 10/04/2019  PCP: [name], MD (Confirm with patient/family/NH records and if not entered, this has to be entered at [facility] point of entry) Patient coming from: home  I have personally briefly reviewed patient's old medical records in [place]  Chief Complaint: N/V, fever, SOB  HPI: Crystal Browning is a 86 y.o. female with medical history significant of HTN, CVA, Laryngeal cancer, a. Fib, hypothyroidism. She just saw her doctor recently and was doing well. She started feeling sick mid-day on the day of admission, with, NV, chills, SOB, fever. She also c/o vulvar pruritis. She presents to [facility] for evaluation (For level 3, the HPI must include 4+ descriptors: Location, Quality, Severity, Duration, Timing, Context, modifying factors, associated signs/symptoms and/or status of 3+ chronic problems.)  (Please avoid self-populating past medical history here) (The initial 2-3 lines should be focused and good to copy and paste in the HPI section of the daily progress note).  ED Course: T 103, BP normal to elevated, O2 sat with a low of 87% on RA, 98% on 2L oxygen. Lab reveals leukocytosis to 14.7 with 90/4/5. CXR with diffuse interstial opacities. She was given levasquin IV. She is referred to [facility] for admission for mgt of CAP.   Review of Systems: As per HPI otherwise 10 point review of systems negative.  Unacceptable ROS statements: "10 systems reviewed," "Extensive" (without elaboration).  Acceptable ROS statements: "All others negative," "All others reviewed and are negative," and "All others unremarkable," with at [place] documented Can't double dip - if using for HPI can't use for ROS  Past Medical History:  Diagnosis Date  . Bilateral lower extremity edema 10/30/2016  . Cancer ([place]) 03/28/11   SCCA of the Supraglottic Larynx (T2NoMo)    ; S/P Chemoradiation therapy from 05/09/05 thru 06/29/05  . Depression     History of Depression- Lexapro therapy  . Diverticulosis 02/2005   History of diverticulosis with admission from 8/18 - 03/04/2005  . Esophageal stricture 03/03/2018  . Generalized abdominal pain 07/17/2015  . History of chemotherapy    S/P chemotherapy with Dr. Sonny Dandy -2006  . Hypertension   . Hypothyroid   . Osteoporosis    History of Osteoporosis with one year of Actonel only  . Radiation    S/P radiation thrapy -06/2005  . TIA (transient ischemic attack)    History of TIA's with no residual effect    Past Surgical History:  Procedure Laterality Date  . CARDIOVERSION N/A 11/23/2016   Procedure: CARDIOVERSION;  Surgeon: Pixie Casino, MD;  Location: [facility] ENDOSCOPY;  Service: Cardiovascular;  Laterality: N/A;  . CATARACT EXTRACTION, BILATERAL    . DIRECT LARYNGOSCOPY  03/27/2005   S/P direct laryngoscopy, esophagoscopy and biopsy with Dr. Constance Holster  . TOTAL ABDOMINAL HYSTERECTOMY W/ BILATERAL SALPINGOOPHORECTOMY  1986  . TUBAL LIGATION     Soc Hx - widowed after 45 years of marriage. She has 4 daughters, 2 sons, innumberable grandchildren, many great-grandchildren and 4 great-great-grandchildren. She lives alone. Family is supportive and helps with chores and food.   reports that she has never smoked. She has never used smokeless tobacco. She reports that she does not drink alcohol or use drugs.  Allergies  Allergen Reactions  . Tuberculin Tests Swelling  . Vit D-Vit E-Safflower Oil   . Paroxetine Hcl Rash    Family History  Problem Relation Age of Onset  . Stroke Mother   . Heart  disease Mother 16  . Cancer Father 20       Prostate  . Alcohol abuse Brother   . COPD Brother    Unacceptable: Noncontributory, unremarkable, or negative. Acceptable: Family history reviewed and not pertinent (If you reviewed it)  Prior to Admission medications   Medication Sig Start Date End Date Taking? Authorizing Provider  amLODipine (NORVASC) 2.5 MG tablet Take 1 tablet (2.5 mg total) by  mouth daily. 09/29/19   [name], MD  apixaban (ELIQUIS) 5 MG TABS tablet Take 1 tablet (5 mg total) by mouth 2 (two) times daily. 09/29/19   [name], MD  B Complex-Folic Acid (B COMPLEX-VITAMIN B12 PO) Take by mouth.    [provider]  clotrimazole-betamethasone (LOTRISONE) cream APPLY TO AFFECTED AREA TWICE A DAY 09/29/19   [name], MD  escitalopram (LEXAPRO) 10 MG tablet Take 1 tablet (10 mg total) by mouth daily. 09/29/19   [name], MD  famotidine (PEPCID) 20 MG tablet Take 1 tablet (20 mg total) by mouth every 12 (twelve) hours. 09/29/19   [name], MD  levothyroxine (SYNTHROID) 50 MCG tablet Take 1 tablet (50 mcg total) by mouth daily. 09/30/19   [name], MD  metoprolol succinate (TOPROL-XL) 100 MG 24 hr tablet Take with or immediately following a meal. 09/29/19   [name], MD  polyethylene glycol powder (GLYCOLAX/MIRALAX) powder Take 17 g by mouth 2 (two) times daily as needed for moderate constipation. For constipation Patient taking differently: Take 17 g by mouth 2 (two) times daily. For constipation 07/12/15   [name], MD  triamcinolone cream (KENALOG) 0.1 % APPLY TO AFFECTED AREA TWICE A DAY 09/29/19   [name], MD  Vitamin D, Ergocalciferol, (DRISDOL) 1.25 MG (50000 UNIT) CAPS capsule Take 1 capsule (50,000 Units total) by mouth every 7 (seven) days. 09/30/19 09/28/20  [name], MD    Physical Exam: Vitals:   10/04/19 2130 10/04/19 2145 10/04/19 2200 10/04/19 2215  BP: 133/65  114/79   Pulse: (!) 35 87 71 72  Resp: 17 16 20 15   Temp:      TempSrc:      SpO2: 98% 98% 98% 98%  Weight:      Height:        Constitutional: NAD, calm, comfortable Vitals:   10/04/19 2130 10/04/19 2145 10/04/19 2200 10/04/19 2215  BP: 133/65  114/79   Pulse: (!) 35 87 71 72  Resp: 17 16 20 15   Temp:      TempSrc:      SpO2: 98% 98% 98% 98%  Weight:      Height:       General- WNWD elderly woman in no distress Eyes: PERRL,  lids and conjunctivae normal ENMT: Mucous membranes are moist. Posterior pharynx clear of any exudate or lesions.Edentulous.  Neck: normal, thickened tissue/stiff, no masses, no thyromegaly Respiratory: bibasilar rales, end-expiratory wheeze left base. Normal respiratory effort. No accessory muscle use.  Cardiovascular: IRIR, no murmurs / rubs / gallops. 1+ LE edema. 1+ pedal pulses. No carotid bruits.  Abdomen: no tenderness, no masses palpated. No hepatosplenomegaly. Bowel sounds positive.  Musculoskeletal: no clubbing / cyanosis. No joint deformity upper and lower extremities. Good ROM, no contractures. Normal muscle tone.  Skin: no rashes, lesions, ulcers. No induration. Genitalia exam deferred to EDP - no report of rash Neurologic: CN 2-12 grossly intact but mildly hard of hearing. Sensation intact. Strength 5/5 in all 4.  Psychiatric: Normal judgment and insight. Alert and oriented x 3. Normal  mood.   (Anything < 9 systems with 2 bullets each down codes to level 1) (If patient refuses exam can't bill higher level) (Make sure to document decubitus ulcers present on admission -- if possible -- and whether patient has chronic indwelling catheter at time of admission)  Labs on Admission: I have personally reviewed following labs and imaging studies  CBC: Recent Labs  Lab 09/29/19 1607 10/04/19 1719  WBC 4.3 14.7*  NEUTROABS 2.4 13.2*  HGB 12.9 13.6  HCT 39.2 41.5  MCV 96 98.8  PLT 206 [id number]   Basic Metabolic Panel: Recent Labs  Lab 09/29/19 1607 10/04/19 1719  NA 142 137  K 3.9 3.7  CL 104 103  CO2 26 21*  GLUCOSE 84 136*  BUN 9 10  CREATININE 0.85 0.74  CALCIUM 9.8 9.0   GFR: Estimated Creatinine Clearance: 43.2 mL/min (by C-G formula based on SCr of 0.74 mg/dL). Liver Function Tests: Recent Labs  Lab 09/29/19 1607 10/04/19 1719  AST 33 62*  ALT 19 32  ALKPHOS 82 81  BILITOT 0.5 0.9  PROT 6.7 7.8  ALBUMIN 4.4 4.4   No results for input(s): LIPASE, AMYLASE in  the last 168 hours. No results for input(s): AMMONIA in the last 168 hours. Coagulation Profile: Recent Labs  Lab 10/04/19 1719  INR 1.3*   Cardiac Enzymes: No results for input(s): CKTOTAL, CKMB, CKMBINDEX, TROPONINI in the last 168 hours. BNP (last 3 results) No results for input(s): PROBNP in the last 8760 hours. HbA1C: No results for input(s): HGBA1C in the last 72 hours. CBG: No results for input(s): GLUCAP in the last 168 hours. Lipid Profile: No results for input(s): CHOL, HDL, LDLCALC, TRIG, CHOLHDL, LDLDIRECT in the last 72 hours. Thyroid Function Tests: No results for input(s): TSH, T4TOTAL, FREET4, T3FREE, THYROIDAB in the last 72 hours. Anemia Panel: No results for input(s): VITAMINB12, FOLATE, FERRITIN, TIBC, IRON, RETICCTPCT in the last 72 hours. Urine analysis:    Component Value Date/Time   COLORURINE YELLOW 06/26/2014 1625   APPEARANCEUR Clear 05/14/2019 1341   LABSPEC 1.020 06/26/2014 1625   PHURINE 6.0 06/26/2014 1625   GLUCOSEU Negative 05/14/2019 1341   HGBUR SMALL (A) 06/26/2014 1625   BILIRUBINUR Negative 05/14/2019 1341   KETONESUR NEGATIVE 06/26/2014 1625   PROTEINUR Negative 05/14/2019 1341   PROTEINUR NEGATIVE 06/26/2014 1625   UROBILINOGEN negative 07/12/2015 1514   UROBILINOGEN 0.2 06/26/2014 1625   NITRITE Negative 05/14/2019 1341   NITRITE NEGATIVE 06/26/2014 1625   LEUKOCYTESUR Negative 05/14/2019 1341    Radiological Exams on Admission: DG Chest Port 1 View  Result Date: 10/04/2019 CLINICAL DATA:  Patient complains of nausea/chills. Code sepsis EXAM: PORTABLE CHEST 1 VIEW COMPARISON:  Chest radiograph 11/12/2016 FINDINGS: Limited exam due to patient chin projecting over the upper chest. Visualized cardiomediastinal contours appear stable. Low lung volumes. Diffuse bilateral coarse interstitial opacities. No definite pneumothorax or significant pleural effusion. No acute finding in the visualized skeleton. IMPRESSION: Diffuse bilateral  coarse interstitial opacities could represent edema or atypical (viral) infection in the acute setting, versus progression of chronic bronchitic changes and scarring. Electronically Signed   By: Audie Pinto M.D.   On: 10/04/2019 17:39    EKG: Independently reviewed. A. Fib at controlled rate, LVH. No acute changes  Assessment/Plan Active Problems:   CAP (community acquired pneumonia)   Essential hypertension   Atrial fibrillation ([place])  (please populate well all problems here in Problem List. (For example, if patient is on BP meds at home and you  resume or decide to hold them, it is a problem that needs to be her. Same for CAD, COPD, HLD and so on)   1. CAP - patient with fever, leukocytosis, abnormal CXR c/w CAP. Received IV levaquin in ED Plan Med-surg admit  Rocephin 2g IV q24,  Azithromycin 500 mg daily x 5 ` O2 to keep sats > 90%  F/u CBC and CXR 09/08/19  2. HTN- stable - continue home meds  3. A. Fib - stable- continue home meds  4. Vulvar pruritis - per EDP note no rash. Plan reexam in AM  5. Code status - discussed with patient in presence of daughter - DNR  6. TOC - patient to go home when stable. Family will be sure someone is with her for several days. Can f/u with PCP  DVT prophylaxis: on NOAC (Lovenox/Heparin/SCD's/anticoagulated/None (if comfort care) Code Status: DNR (Full/Partial (specify details) Family Communication: Daughter at bedside - understands dx and tx plan. Endorses mothers decision re: code status (Specify name, relationship. Do not write "discussed with patient". Specify tel # if discussed over the phone) Disposition Plan: home 48-72 hours (specify when and where you expect patient to be discharged) Consults called: none (with names) Admission status: inpatient (inpatient / obs / tele / medical floor / SDU)   Adella Hare MD [facility] Pager [phone]  If 7PM-7AM, please contact night-coverage [email or web address] Password  [facility]  10/04/2019, 10:52 PM

## 2019-10-04 NOTE — ED Provider Notes (Signed)
[facility] EMERGENCY DEPARTMENT Provider Note   CSN: [id number] Arrival date & time: 10/04/19  1656     History Chief Complaint  Patient presents with  . Nausea  . Chills    Crystal Browning is a 86 y.o. female.  Patient brought to the emergency department for feeling bad vomiting fever and mild shortness of breath  The history is provided by the patient. No language interpreter was used.  Fever Max temp prior to arrival:  103 Temp source:  Oral Severity:  Moderate Onset quality:  Sudden Timing:  Constant Progression:  Worsening Chronicity:  New Relieved by:  Nothing Worsened by:  Nothing Ineffective treatments:  None tried Associated symptoms: no chest pain, no congestion, no diarrhea, no headaches and no rash   Risk factors: no contaminated food        Past Medical History:  Diagnosis Date  . Bilateral lower extremity edema 10/30/2016  . Cancer ([place]) 03/28/11   SCCA of the Supraglottic Larynx (T2NoMo)    ; S/P Chemoradiation therapy from 05/09/05 thru 06/29/05  . Depression    History of Depression- Lexapro therapy  . Diverticulosis 02/2005   History of diverticulosis with admission from 8/18 - 03/04/2005  . Esophageal stricture 03/03/2018  . Generalized abdominal pain 07/17/2015  . History of chemotherapy    S/P chemotherapy with Dr. Sonny Dandy -2006  . Hypertension   . Hypothyroid   . Osteoporosis    History of Osteoporosis with one year of Actonel only  . Radiation    S/P radiation thrapy -06/2005  . TIA (transient ischemic attack)    History of TIA's with no residual effect    Patient Active Problem List   Diagnosis Date Noted  . Pharyngoesophageal dysphagia 01/14/2018  . Long term (current) use of anticoagulants 12/14/2016  . Atrial fibrillation ([place]) 10/30/2016  . Other fatigue 10/30/2016  . Venous insufficiency of both lower extremities 10/30/2016  . History of external beam radiation therapy 08/20/2016  . History of laryngeal cancer 08/20/2016  .  Xerostomia 08/20/2016  . Postmenopausal 07/17/2015  . Thyroid activity decreased 07/17/2015  . Essential hypertension 07/12/2015  . Angina pectoris ([place]) 07/12/2015  . Vitamin D deficiency 07/12/2015  . 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 CARDIOVERSION N/A 11/23/2016   Procedure: CARDIOVERSION;  Surgeon: Pixie Casino, MD;  Location: [facility] ENDOSCOPY;  Service: Cardiovascular;  Laterality: N/A;  . CATARACT EXTRACTION, BILATERAL    . DIRECT LARYNGOSCOPY  03/27/2005   S/P direct laryngoscopy, esophagoscopy and biopsy with Dr. Constance Holster  . TOTAL ABDOMINAL HYSTERECTOMY W/ BILATERAL SALPINGOOPHORECTOMY  1986  . TUBAL LIGATION       OB History   No obstetric history on file.     Family History  Problem Relation Age of Onset  . Stroke Mother   . Heart disease Mother 67  . Cancer Father 63       Prostate  . Alcohol abuse Brother   . COPD Brother     Social History   Tobacco Use  . Smoking status: Never Smoker  . Smokeless tobacco: Never Used  Substance Use Topics  . Alcohol use: No  . Drug use: No    Home Medications Prior to Admission medications   Medication Sig Start Date End Date Taking? Authorizing Provider  amLODipine (NORVASC) 2.5 MG tablet Take 1 tablet (2.5 mg total) by mouth daily. 09/29/19   [name], MD  apixaban [place]ne [place]) 5  MG TABS tablet Take 1 tablet (5 mg total) by mouth 2 (two) times daily. 09/29/19   [name], MD  B Complex-Folic Acid (B COMPLEX-VITAMIN B12 PO) Take by mouth.    [provider]  clotrimazole-betamethasone (LOTRISONE) cream APPLY TO AFFECTED AREA TWICE A DAY 09/29/19   [name], MD  escitalopram (LEXAPRO) 10 MG tablet Take 1 tablet (10 mg total) by mouth daily. 09/29/19   [name], MD  famotidine (PEPCID) 20 MG tablet Take 1 tablet (20 mg total) by mouth every 12 (twelve) hours. 09/29/19   [name], MD  levothyroxine (SYNTHROID) 50 MCG  tablet Take 1 tablet (50 mcg total) by mouth daily. 09/30/19   [name], MD  metoprolol succinate (TOPROL-XL) 100 MG 24 hr tablet Take with or immediately following a meal. 09/29/19   [name], MD  polyethylene glycol powder (GLYCOLAX/MIRALAX) powder Take 17 g by mouth 2 (two) times daily as needed for moderate constipation. For constipation Patient taking differently: Take 17 g by mouth 2 (two) times daily. For constipation 07/12/15   [name], MD  triamcinolone cream (KENALOG) 0.1 % APPLY TO AFFECTED AREA TWICE A DAY 09/29/19   [name], MD  Vitamin D, Ergocalciferol, (DRISDOL) 1.25 MG (50000 UNIT) CAPS capsule Take 1 capsule (50,000 Units total) by mouth every 7 (seven) days. 09/30/19 09/28/20  [name], MD    Allergies    Tuberculin tests, Vit d-vit e-safflower oil, and Paroxetine hcl  Review of Systems   Review of Systems  Constitutional: Positive for fever. Negative for appetite change and fatigue.  HENT: Negative for congestion, ear discharge and sinus pressure.   Eyes: Negative for discharge.  Respiratory: Positive for shortness of breath.   Cardiovascular: Negative for chest pain.  Gastrointestinal: Negative for abdominal pain and diarrhea.  Genitourinary: Negative for frequency and hematuria.  Musculoskeletal: Negative for back pain.  Skin: Negative for rash.  Neurological: Negative for seizures and headaches.  Psychiatric/Behavioral: Negative for hallucinations.    Physical Exam Updated Vital Signs BP 127/83   Pulse 93   Temp 98.4 F (36.9 C) (Oral)   Resp (!) 22   Ht 5\' 1"  (1.549 m)   Wt 59 kg   SpO2 97%   BMI 24.56 kg/m   Physical Exam Vitals and nursing note reviewed.  Constitutional:      Appearance: She is well-developed.  HENT:     Head: Normocephalic.     Nose: Nose normal.  Eyes:     General: No scleral icterus.    Conjunctiva/sclera: Conjunctivae normal.  Neck:     Thyroid: No thyromegaly.  Cardiovascular:     Rate and  Rhythm: Normal rate and regular rhythm.     Heart sounds: No murmur. No friction rub. No gallop.   Pulmonary:     Breath sounds: No stridor. No wheezing or rales.  Chest:     Chest wall: No tenderness.  Abdominal:     General: There is no distension.     Tenderness: There is no abdominal tenderness. There is no rebound.  Musculoskeletal:        General: Normal range of motion.     Cervical back: Neck supple.  Lymphadenopathy:     Cervical: No cervical adenopathy.  Skin:    Findings: No erythema or rash.  Neurological:     Mental Status: She is oriented to person, place, and time.     Motor: No abnormal muscle tone.     Coordination: Coordination normal.  Psychiatric:  Behavior: Behavior normal.     ED Results / Procedures / Treatments   Labs (all labs ordered are listed, but only abnormal results are displayed) Labs Reviewed  COMPREHENSIVE METABOLIC PANEL - Abnormal; Notable for the following components:      Result Value   CO2 21 (*)    Glucose, Bld 136 (*)    AST 62 (*)    All other components within normal limits  CBC WITH DIFFERENTIAL/PLATELET - Abnormal; Notable for the following components:   WBC 14.7 (*)    Neutro Abs 13.2 (*)    Lymphs Abs 0.6 (*)    Abs Immature Granulocytes 0.11 (*)    All other components within normal limits  PROTIME-INR - Abnormal; Notable for the following components:   Prothrombin Time 15.6 (*)    INR 1.3 (*)    All other components within normal limits  CULTURE, BLOOD (ROUTINE X 2)  CULTURE, BLOOD (ROUTINE X 2)  RESPIRATORY PANEL BY RT PCR (FLU A&B, COVID)  URINE CULTURE  LACTIC ACID, PLASMA  APTT  LACTIC ACID, PLASMA  URINALYSIS, ROUTINE W REFLEX MICROSCOPIC  BRAIN NATRIURETIC PEPTIDE  POC OCCULT BLOOD, ED  TYPE AND SCREEN    EKG None  Radiology DG Chest Port 1 View  Result Date: 10/04/2019 CLINICAL DATA:  Patient complains of nausea/chills. Code sepsis EXAM: PORTABLE CHEST 1 VIEW COMPARISON:  Chest radiograph  11/12/2016 FINDINGS: Limited exam due to patient chin projecting over the upper chest. Visualized cardiomediastinal contours appear stable. Low lung volumes. Diffuse bilateral coarse interstitial opacities. No definite pneumothorax or significant pleural effusion. No acute finding in the visualized skeleton. IMPRESSION: Diffuse bilateral coarse interstitial opacities could represent edema or atypical (viral) infection in the acute setting, versus progression of chronic bronchitic changes and scarring. Electronically Signed   By: Audie Pinto M.D.   On: 10/04/2019 17:39    Procedures Procedures (including critical care time)  Medications Ordered in ED Medications  sodium chloride 0.9 % bolus 1,000 mL (1,000 mLs Intravenous New Bag/Given 10/04/19 1744)  levofloxacin (LEVAQUIN) IVPB 500 mg (0 mg Intravenous Stopped 10/04/19 1901)  pantoprazole (PROTONIX) injection 40 mg (40 mg Intravenous Given 10/04/19 1746)    ED Course  I have reviewed the triage vital signs and the nursing notes.  Pertinent labs & imaging results that were available during my care of the patient were reviewed by me and considered in my medical decision making (see chart for details).    CRITICAL CARE Performed by: Milton Ferguson Total critical care time: 45 minutes Critical care time was exclusive of separately billable procedures and treating other patients. Critical care was necessary to treat or prevent imminent or life-threatening deterioration. Critical care was time spent personally by me on the following activities: development of treatment plan with patient and/or surrogate as well as nursing, discussions with consultants, evaluation of patient's response to treatment, examination of patient, obtaining history from patient or surrogate, ordering and performing treatments and interventions, ordering and review of laboratory studies, ordering and review of radiographic studies, pulse oximetry and re-evaluation of  patient's condition.  MDM Rules/Calculators/A&P                     Patient with sepsis possibly from pneumonia.  She will be admitted to medicine  Final Clinical Impression(s) / ED Diagnoses Final diagnoses:  Community acquired pneumonia of left lower lobe of lung    Rx / DC Orders ED Discharge Orders    None  Milton Ferguson, MD 10/04/19 2030

## 2019-10-05 DIAGNOSIS — J9601 Acute respiratory failure with hypoxia: Secondary | ICD-10-CM

## 2019-10-05 DIAGNOSIS — R1310 Dysphagia, unspecified: Secondary | ICD-10-CM

## 2019-10-05 DIAGNOSIS — R6520 Severe sepsis without septic shock: Secondary | ICD-10-CM

## 2019-10-05 DIAGNOSIS — A419 Sepsis, unspecified organism: Principal | ICD-10-CM

## 2019-10-05 LAB — CBC
HCT: 36 % (ref 36.0–46.0)
Hemoglobin: 11.4 g/dL — ABNORMAL LOW (ref 12.0–15.0)
MCH: 31.4 pg (ref 26.0–34.0)
MCHC: 31.7 g/dL (ref 30.0–36.0)
MCV: 99.2 fL (ref 80.0–100.0)
Platelets: 154 10*3/uL (ref 150–400)
RBC: 3.63 MIL/uL — ABNORMAL LOW (ref 3.87–5.11)
RDW: 13.6 % (ref 11.5–15.5)
WBC: 13.3 10*3/uL — ABNORMAL HIGH (ref 4.0–10.5)
nRBC: 0 % (ref 0.0–0.2)

## 2019-10-05 LAB — URINALYSIS, ROUTINE W REFLEX MICROSCOPIC
Bilirubin Urine: NEGATIVE
Glucose, UA: NEGATIVE mg/dL
Hgb urine dipstick: NEGATIVE
Ketones, ur: 5 mg/dL — AB
Leukocytes,Ua: NEGATIVE
Nitrite: NEGATIVE
Protein, ur: 30 mg/dL — AB
Specific Gravity, Urine: 1.011 (ref 1.005–1.030)
pH: 7 (ref 5.0–8.0)

## 2019-10-05 LAB — STREP PNEUMONIAE URINARY ANTIGEN: Strep Pneumo Urinary Antigen: NEGATIVE

## 2019-10-05 MED ORDER — ACETAMINOPHEN 325 MG PO TABS
650.0000 mg | ORAL_TABLET | Freq: Four times a day (QID) | ORAL | Status: DC | PRN
  Administered 2019-10-06: 17:00:00 650 mg via ORAL
  Filled 2019-10-05: qty 2

## 2019-10-05 MED ORDER — POLYETHYLENE GLYCOL 3350 17 G PO PACK
17.0000 g | PACK | Freq: Every day | ORAL | Status: DC
  Administered 2019-10-05: 10:00:00 17 g via ORAL
  Filled 2019-10-05 (×2): qty 1

## 2019-10-05 MED ORDER — ACETAMINOPHEN 325 MG PO TABS
650.0000 mg | ORAL_TABLET | Freq: Four times a day (QID) | ORAL | Status: DC | PRN
  Administered 2019-10-05: 19:00:00 650 mg via ORAL
  Filled 2019-10-05: qty 2

## 2019-10-05 NOTE — Progress Notes (Signed)
PROGRESS NOTE    Crystal Browning  XTK:[id number] DOB: [birth year] DOA: 10/04/2019 PCP: [name], MD     Brief Narrative:  As per H&P written by Dr. Linda Hedges on 10/04/2019 86 y.o. female with medical history significant of HTN, CVA, Laryngeal cancer, a. Fib, hypothyroidism. She just saw her doctor recently and was doing well. She started feeling sick mid-day on the day of admission, with, NV, chills, SOB, fever. She also c/o vulvar pruritis. She presents to [facility].  ED Course: T 103, BP normal to elevated, O2 sat with a low of 87% on RA, 98% on 2L oxygen. Lab reveals leukocytosis to 14.7 with 90/4/5. CXR with diffuse interstial opacities. She was given levasquin IV. She is referred to [facility] for admission for mgt of sepsis secondary to CAP.   Assessment & Plan: 1-sepsis secondary to community-acquired pneumonia: Patient met sepsis criteria on presentation with elevated temperature, elevated respiratory rate and organ dysfunction appreciated as hypoxia. -Continue oxygen supplementation and wean as tolerated -Continue the use of flutter valve -Continue current antibiotics -Assess swallowing capability by speech therapy -Continue supportive care.  2-Essential hypertension -Overall stable -Continue home medication regimen -Follow vital signs.  3-chronic atrial fibrillation (HCC) -Rate controlled -Continue metoprolol and eliquis -telemetry will be discontinue.    4-vulvar pruritus -patient reported not complaints today -will monitor and if needed empirically treat her for candidiasis.  5-hypothyroidism and -Continue Synthroid.  6-history of gastroesophageal reflux disease -Continue H2 blockers.  7-depression -Continue Lexapro -No suicidal ideation or hallucination currently -Stable mood.  8-DNR -Patient has expressed decision for CODE STATUS and this will be respected.   DVT prophylaxis: Chronically on apixaban. Code Status: DNR Family Communication:  Daughter updated over the phone. Disposition Plan: Remains inpatient, continue IV antibiotics, speech therapy consulted to assess patient's swallowing capacity.  Dysphagia 1 diet with crushed medications has been ordered as an interim approach.  Follow culture results.  Consultants:   None  Procedures:   See below for x-ray reports.  Antimicrobials:  Anti-infectives (From admission, onward)   Start     Dose/Rate Route Frequency Ordered Stop   10/05/19 1000  azithromycin (ZITHROMAX) tablet 500 mg     500 mg Oral Daily 10/04/19 2232 10/10/19 0959   10/04/19 2245  cefTRIAXone (ROCEPHIN) 2 g in sodium chloride 0.9 % 100 mL IVPB     2 g 200 mL/hr over 30 Minutes Intravenous Daily at bedtime 10/04/19 2232 10/09/19 2159   10/04/19 1730  levofloxacin (LEVAQUIN) IVPB 500 mg     500 mg 100 mL/hr over 60 Minutes Intravenous  Once 10/04/19 1721 10/04/19 1901      Subjective: Currently afebrile, reporting some shortness of breath and intermittent productive coughing spells.  Expressing difficulty swallowing and sensation of chest discomfort while attempting it.  Objective: Vitals:   10/04/19 2234 10/04/19 2329 10/05/19 0332 10/05/19 1334  BP:  (!) 158/82 132/69 (!) 175/71  Pulse: (!) 110 91 71 74  Resp:  '18 18 16  ' Temp:  98 F (36.7 C) 97.7 F (36.5 C) 98.1 F (36.7 C)  TempSrc:  Oral Oral Oral  SpO2: 95% 95% 100% 97%  Weight:  59.5 kg    Height:  '5\' 1"'  (1.549 m)      Intake/Output Summary (Last 24 hours) at 10/05/2019 1401 Last data filed at 10/05/2019 0349 Gross per 24 hour  Intake 220 ml  Output 200 ml  Net 20 ml   Filed Weights   10/04/19 1709 10/04/19 2329  Weight:  59 kg 59.5 kg    Examination: General exam: Alert, awake, oriented x 3; reports productive intermittent coughing spells, mild shortness of breath (especially on exertion) and also some difficulty swallowing with sensation of chest discomfort. Respiratory system: Positive rhonchi, no wheezing, no using  accessory muscles.  2 L nasal cannula supplementation in place. Cardiovascular system: Rate controlled, irregular rhythm; no rubs, no gallops, trace edema bilaterally. Gastrointestinal system: Abdomen is nondistended, soft and nontender. No organomegaly or masses felt. Normal bowel sounds heard. Central nervous system: Alert and oriented. No focal neurological deficits. Extremities: No cyanosis or clubbing. Skin: No rashes, no petechiae Psychiatry: Judgement and insight appear normal. Mood & affect appropriate.     Data Reviewed: I have personally reviewed following labs and imaging studies  CBC: Recent Labs  Lab 09/29/19 1607 10/04/19 1719 10/05/19 0441  WBC 4.3 14.7* 13.3*  NEUTROABS 2.4 13.2*  --   HGB 12.9 13.6 11.4*  HCT 39.2 41.5 36.0  MCV 96 98.8 99.2  PLT 206 192 573   Basic Metabolic Panel: Recent Labs  Lab 09/29/19 1607 10/04/19 1719  NA 142 137  K 3.9 3.7  CL 104 103  CO2 26 21*  GLUCOSE 84 136*  BUN 9 10  CREATININE 0.85 0.74  CALCIUM 9.8 9.0   GFR: Estimated Creatinine Clearance: 43.4 mL/min (by C-G formula based on SCr of 0.74 mg/dL)..  Liver Function Tests: Recent Labs  Lab 09/29/19 1607 10/04/19 1719  AST 33 62*  ALT 19 32  ALKPHOS 82 81  BILITOT 0.5 0.9  PROT 6.7 7.8  ALBUMIN 4.4 4.4   Coagulation Profile: Recent Labs  Lab 10/04/19 1719  INR 1.3*   Urine analysis:    Component Value Date/Time   COLORURINE YELLOW 10/05/2019 0415   APPEARANCEUR CLEAR 10/05/2019 0415   APPEARANCEUR Clear 05/14/2019 1341   LABSPEC 1.011 10/05/2019 0415   PHURINE 7.0 10/05/2019 0415   GLUCOSEU NEGATIVE 10/05/2019 0415   HGBUR NEGATIVE 10/05/2019 0415   BILIRUBINUR NEGATIVE 10/05/2019 0415   BILIRUBINUR Negative 05/14/2019 1341   KETONESUR 5 (A) 10/05/2019 0415   PROTEINUR 30 (A) 10/05/2019 0415   UROBILINOGEN negative 07/12/2015 1514   UROBILINOGEN 0.2 06/26/2014 1625   NITRITE NEGATIVE 10/05/2019 0415   LEUKOCYTESUR NEGATIVE 10/05/2019 0415     Recent Results (from the past 240 hour(s))  Blood Culture (routine x 2)     Status: None (Preliminary result)   Collection Time: 10/04/19  5:19 PM   Specimen: Right Antecubital; Blood  Result Value Ref Range Status   Specimen Description RIGHT ANTECUBITAL  Final   Special Requests   Final    BOTTLES DRAWN AEROBIC AND ANAEROBIC Blood Culture results may not be optimal due to an inadequate volume of blood received in culture bottles   Culture   Final    NO GROWTH < 24 HOURS Performed at [facility], [address] [place], [place] [address]    Report Status PENDING  Incomplete  Blood Culture (routine x 2)     Status: None (Preliminary result)   Collection Time: 10/04/19  5:35 PM   Specimen: BLOOD LEFT WRIST  Result Value Ref Range Status   Specimen Description BLOOD LEFT WRIST  Final   Special Requests   Final    BOTTLES DRAWN AEROBIC AND ANAEROBIC Blood Culture adequate volume   Culture   Final    NO GROWTH < 24 HOURS Performed at [facility], [address] [place],  [address]    Report Status PENDING  Incomplete  Respiratory Panel by RT PCR (Flu A&B, Covid) - Nasopharyngeal Swab     Status: None   Collection Time: 10/04/19  5:52 PM   Specimen: Nasopharyngeal Swab  Result Value Ref Range Status   SARS Coronavirus 2 by RT PCR NEGATIVE NEGATIVE Final    Comment: (NOTE) SARS-CoV-2 target nucleic acids are NOT DETECTED. The SARS-CoV-2 RNA is generally detectable in upper respiratoy specimens during the acute phase of infection. The lowest concentration of SARS-CoV-2 viral copies this assay can detect is 131 copies/mL. A negative result does not preclude SARS-Cov-2 infection and should not be used as the sole basis for treatment or other patient management decisions. A negative result may occur with  improper specimen collection/handling, submission of specimen other than nasopharyngeal swab, presence of viral mutation(s) within the areas targeted by this assay, and  inadequate number of viral copies (<131 copies/mL). A negative result must be combined with clinical observations, patient history, and epidemiological information. The expected result is Negative. Fact Sheet for Patients:  PinkCheek.be Fact Sheet for Healthcare Providers:  GravelBags.it This test is not yet ap proved or cleared by the [place] FDA and  has been authorized for detection and/or diagnosis of SARS-CoV-2 by FDA under an Emergency Use Authorization (EUA). This EUA will remain  in effect (meaning this test can be used) for the duration of the COVID-19 declaration under Section 564(b)(1) of the Act, 21 U.S.C. section 360bbb-3(b)(1), unless the authorization is terminated or revoked sooner.    Influenza A by PCR NEGATIVE NEGATIVE Final   Influenza B by PCR NEGATIVE NEGATIVE Final    Comment: (NOTE) The Xpert Xpress SARS-CoV-2/FLU/RSV assay is intended as an aid in  the diagnosis of influenza from Nasopharyngeal swab specimens and  should not be used as a sole basis for treatment. Nasal washings and  aspirates are unacceptable for Xpert Xpress SARS-CoV-2/FLU/RSV  testing. Fact Sheet for Patients: PinkCheek.be Fact Sheet for Healthcare Providers: GravelBags.it This test is not yet approved or cleared by the [place] FDA and  has been authorized for detection and/or diagnosis of SARS-CoV-2 by  FDA under an Emergency Use Authorization (EUA). This EUA will remain  in effect (meaning this test can be used) for the duration of the  Covid-19 declaration under Section 564(b)(1) of the Act, 21  U.S.C. section 360bbb-3(b)(1), unless the authorization is  terminated or revoked. Performed at [facility], [address] [place], [place] [address]      Radiology Studies: [facility] Chest [facility] 1 View  Result Date: 10/04/2019 CLINICAL DATA:  Patient complains of  nausea/chills. Code sepsis EXAM: PORTABLE CHEST 1 VIEW COMPARISON:  Chest radiograph 11/12/2016 FINDINGS: Limited exam due to patient chin projecting over the upper chest. Visualized cardiomediastinal contours appear stable. Low lung volumes. Diffuse bilateral coarse interstitial opacities. No definite pneumothorax or significant pleural effusion. No acute finding in the visualized skeleton. IMPRESSION: Diffuse bilateral coarse interstitial opacities could represent edema or atypical (viral) infection in the acute setting, versus progression of chronic bronchitic changes and scarring. Electronically Signed   By: Audie Pinto M.D.   On: 10/04/2019 17:39   Scheduled Meds: . amLODipine  2.5 mg Oral Daily  . apixaban  5 mg Oral BID  . azithromycin  500 mg Oral Daily  . escitalopram  10 mg Oral Daily  . famotidine  20 mg Oral Q12H  . levothyroxine  50 mcg Oral Q0600  . metoprolol succinate  100 mg Oral Daily  . polyethylene glycol  17 g  Oral Daily  . sodium chloride flush  3 mL Intravenous Q12H   Continuous Infusions: . sodium chloride    . cefTRIAXone (ROCEPHIN)  IV 2 g (10/04/19 2359)     LOS: 1 day    Time spent: 30 minutes   Barton Dubois, MD [facility] Pager [phone]   10/05/2019, 2:01 PM

## 2019-10-05 NOTE — Progress Notes (Signed)
TR[facility] night shift.  The patient does not have a code status and told the staff that she desires to be DNR.  She discussed this with Dr. Linda Hedges in the presence of her daughter earlier in the shift.  DNR order placed.  Tennis Must, MD.

## 2019-10-05 NOTE — Evaluation (Signed)
Clinical/Bedside Swallow Evaluation Patient Details  Name: Crystal Browning MRN: [id number] Date of Birth: 1934/05/06  Today's Date: 10/05/2019 Time: SLP Start Time (ACUTE ONLY): 1700 SLP Stop Time (ACUTE ONLY): 1725 SLP Time Calculation (min) (ACUTE ONLY): 25 min  Past Medical History:  Past Medical History:  Diagnosis Date  . Bilateral lower extremity edema 10/30/2016  . Cancer ([place]) 03/28/11   SCCA of the Supraglottic Larynx (T2NoMo)    ; S/P Chemoradiation therapy from 05/09/05 thru 06/29/05  . Depression    History of Depression- Lexapro therapy  . Diverticulosis 02/2005   History of diverticulosis with admission from 8/18 - 03/04/2005  . Esophageal stricture 03/03/2018  . Generalized abdominal pain 07/17/2015  . History of chemotherapy    S/P chemotherapy with Dr. Sonny Dandy -2006  . Hypertension   . Hypothyroid   . Osteoporosis    History of Osteoporosis with one year of Actonel only  . Radiation    S/P radiation thrapy -06/2005  . TIA (transient ischemic attack)    History of TIA's with no residual effect   Past Surgical History:  Past Surgical History:  Procedure Laterality Date  . CARDIOVERSION N/A 11/23/2016   Procedure: CARDIOVERSION;  Surgeon: Pixie Casino, MD;  Location: [facility] ENDOSCOPY;  Service: Cardiovascular;  Laterality: N/A;  . CATARACT EXTRACTION, BILATERAL    . DIRECT LARYNGOSCOPY  03/27/2005   S/P direct laryngoscopy, esophagoscopy and biopsy with Dr. Constance Holster  . TOTAL ABDOMINAL HYSTERECTOMY W/ BILATERAL SALPINGOOPHORECTOMY  1986  . TUBAL LIGATION     HPI:  Crystal Browning is a 86 y.o. female with medical history significant of HTN, CVA, Laryngeal cancer, a. Fib, hypothyroidism. She just saw her doctor recently and was doing well. She started feeling sick mid-day on the day of admission, with, NV, chills, SOB, fever. She also c/o vulvar pruritis. She presents to [facility] for evaluation. T 103, BP normal to elevated, O2 sat with a low of 87% on RA, 98% on 2L oxygen. Lab reveals  leukocytosis to 14.7 with 90/4/5. CXR with diffuse interstial opacities. She was given levasquin IV. She is referred to [facility] for admission for mgt of CAP. BSE requested.   Assessment / Plan / Recommendation Clinical Impression  Clinical swallow evaluation completed at bedside with daughter-in-law present. Pt has a h/o SCCA of the supraglottic larynx ~16 years ago and has suspected fibrosis to neck area upon palpation. Pt had barium swallow in 2019 which showed suspected Schatzki's ring and reflux from esophagus into oral cavity. Pt underwent esophageal dilation in September 2019 which resulted in improvement in symptoms. Pt now indicates that she has had trouble swallowing solids and liquid for the past several months (solids>liquids) and has lost weight at home. Pt is edentulous, but has ill fitting dentures which she rarely wears to eat. She exhibited some delayed coughing after thin liquids and chopped foods. Recommend MBSS tomorrow to objectively evaluate swallow and identify additional needs in regards to dysphagia. Pt/family in agreement with plan of care. SLP will follow.   SLP Visit Diagnosis: Dysphagia, unspecified (R13.10)    Aspiration Risk  Mild aspiration risk    Diet Recommendation Dysphagia 2 (Fine chop);Thin liquid   Liquid Administration via: Cup;Straw Medication Administration: Whole meds with liquid Supervision: Patient able to self feed Compensations: Slow rate;Small sips/bites;Multiple dry swallows after each bite/sip;Follow solids with liquid Postural Changes: Seated upright at 90 degrees;Remain upright for at least 30 minutes after po intake    Other  Recommendations Oral Care Recommendations: Oral care  BID;Staff/trained caregiver to provide oral care Other Recommendations: Clarify dietary restrictions   Follow up Recommendations None      Frequency and Duration min 2x/week  1 week       Prognosis Prognosis for Safe Diet Advancement: Fair Barriers to Reach Goals:  (post radiation sequelae)      Swallow Study   General Date of Onset: 10/04/19 HPI: Crystal Browning is a 86 y.o. female with medical history significant of HTN, CVA, Laryngeal cancer, a. Fib, hypothyroidism. She just saw her doctor recently and was doing well. She started feeling sick mid-day on the day of admission, with, NV, chills, SOB, fever. She also c/o vulvar pruritis. She presents to [facility] for evaluation. T 103, BP normal to elevated, O2 sat with a low of 87% on RA, 98% on 2L oxygen. Lab reveals leukocytosis to 14.7 with 90/4/5. CXR with diffuse interstial opacities. She was given levasquin IV. She is referred to [facility] for admission for mgt of CAP. BSE requested. Type of Study: Bedside Swallow Evaluation Previous Swallow Assessment: Barium swallow 01/31/18 reflux, ? Schatzki's ring Diet Prior to this Study: Dysphagia 2 (chopped);Thin liquids Temperature Spikes Noted: No Respiratory Status: Nasal cannula History of Recent Intubation: No Behavior/Cognition: Alert;Cooperative;Pleasant mood Oral Cavity Assessment: Dry Oral Care Completed by SLP: No Oral Cavity - Dentition: Edentulous(Pt has ill fitting U/L dentures, but rarely wears to eat) Vision: Functional for self-feeding Self-Feeding Abilities: Able to feed self;Needs set up Patient Positioning: Upright in bed Baseline Vocal Quality: Hoarse Volitional Cough: Strong Volitional Swallow: Able to elicit(fibrosis apparent upon palpation)    Oral/Motor/Sensory Function Overall Oral Motor/Sensory Function: Within functional limits(fibrosis to neck)   Ice Chips Ice chips: Within functional limits Presentation: Spoon   Thin Liquid Thin Liquid: Impaired Presentation: Cup;Self Fed;Straw Pharyngeal  Phase Impairments: Decreased hyoid-laryngeal movement;Multiple swallows;Cough - Delayed    Nectar Thick Nectar Thick Liquid: Not tested   Honey Thick Honey Thick Liquid: Not tested   Puree Puree: Within functional limits Presentation: Self  Fed;Spoon   Solid     Solid: Impaired Presentation: Self Fed Oral Phase Impairments: Impaired mastication Oral Phase Functional Implications: Prolonged oral transit;Impaired mastication Pharyngeal Phase Impairments: Cough - Delayed     Thank you,  Genene Churn, [place]  Baer Hinton 10/05/2019,5:38 PM

## 2019-10-05 NOTE — Progress Notes (Signed)
Initial Nutrition Assessment  DOCUMENTATION CODES:      INTERVENTION:  -Ensure Enlive po BID, each supplement provides 350 kcal and 20 grams of protein    [Prefers vanilla flavor]   NUTRITION DIAGNOSIS:   Inadequate oral intake related to acute illness (febrile, vomiting PTA) as evidenced by  pt family report (decreased po intake 2-3 days PTA) and acute work-up.  GOAL:  Patient will meet greater than or equal to 90% of their needs    MONITOR:   PO intake, Supplement acceptance, Labs, Weight trends   REASON FOR ASSESSMENT:   Malnutrition Screening Tool    ASSESSMENT: Patient is an 86 yo female with hx of laryngeal cancer, esophageal stricture, TIA and hypertension. Complaint of chills, nausea and coffee ground emesis on admission. Temp-103. Chest x-ray- findings- CAP.   Daughter-in law at bedside during RD visit. Patient ate very well lunch today >75% of meal. Ate some of her eggs at breakfast. Reported some difficulty managing regular textures and asks for her diet to be minced. Downgraded now to pureed. Feeds herself.   Patient denies further vomiting and is tolerating her lunch. Endorses daily oral supplement (Ensure) at home and takes B-12. Family lives nearby and provides daily support for her meals.    Weight history: 10/04/19- 59.5 kg 09/29/19- 57.2 kg 07/01/19-57.9 kg 05/14/19- 57.6 kg Reports usual weight 127-130 lb which agrees with hospital records.   Labs: Glucose 136 mg/dl.   Medications reviewed. Pepcid, Miralax, Lexapro.    NUTRITION - FOCUSED PHYSICAL EXAM: Nutrition-Focused physical exam findings: mild orbital, thoraic fat depletion, mild temporal muscle depletion, and no edema.    Diet Order:   Diet Order            DIET - DYS 1 Room service appropriate? Yes; Fluid consistency: Thin  Diet effective now             EDUCATION NEEDS:  Education needs have been addressed Skin:  Skin Assessment: Reviewed RN Assessment  Last BM:   10/05/19  Height:   Ht Readings from Last 1 Encounters:  10/04/19 5\' 1"  (1.549 m)    Weight:   Wt Readings from Last 1 Encounters:  10/04/19 59.5 kg    Ideal Body Weight:   48 kg  BMI:  Body mass index is 24.79 kg/m.  Estimated Nutritional Needs:   Kcal:  1400-1600  Protein:  72-78 gr  Fluid:  >1400 ml daily fluid   Colman Cater MS,RD,CSG,LDN Office: [phone] Pager: [phone]

## 2019-10-05 NOTE — Progress Notes (Signed)
No code status ordered for patient at this time. Patient stated that "if something happens to me let me go home to the [place]". Patient confirmed she is a DNR. Dr.Ortiz notified.

## 2019-10-06 ENCOUNTER — Inpatient Hospital Stay (HOSPITAL_COMMUNITY): Payer: Medicare Other

## 2019-10-06 ENCOUNTER — Encounter (HOSPITAL_COMMUNITY): Payer: Self-pay | Admitting: Internal Medicine

## 2019-10-06 LAB — URINE CULTURE

## 2019-10-06 LAB — BASIC METABOLIC PANEL
Anion gap: 6 (ref 5–15)
BUN: 12 mg/dL (ref 8–23)
CO2: 26 mmol/L (ref 22–32)
Calcium: 8.6 mg/dL — ABNORMAL LOW (ref 8.9–10.3)
Chloride: 107 mmol/L (ref 98–111)
Creatinine, Ser: 0.68 mg/dL (ref 0.44–1.00)
GFR calc Af Amer: >60 mL/min (ref >60)
GFR calc non Af Amer: >60 mL/min (ref >60)
Glucose, Bld: 111 mg/dL — ABNORMAL HIGH (ref 70–99)
Potassium: 3.5 mmol/L (ref 3.5–5.1)
Sodium: 139 mmol/L (ref 135–145)

## 2019-10-06 LAB — CBC WITH DIFFERENTIAL/PLATELET
Abs Immature Granulocytes: 0.03 10*3/uL (ref 0.00–0.07)
Basophils Absolute: 0 10*3/uL (ref 0.0–0.1)
Basophils Relative: 0 %
Eosinophils Absolute: 0.1 10*3/uL (ref 0.0–0.5)
Eosinophils Relative: 1 %
HCT: 35.3 % — ABNORMAL LOW (ref 36.0–46.0)
Hemoglobin: 11.5 g/dL — ABNORMAL LOW (ref 12.0–15.0)
Immature Granulocytes: 0 %
Lymphocytes Relative: 10 %
Lymphs Abs: 0.9 10*3/uL (ref 0.7–4.0)
MCH: 31.9 pg (ref 26.0–34.0)
MCHC: 32.6 g/dL (ref 30.0–36.0)
MCV: 98.1 fL (ref 80.0–100.0)
Monocytes Absolute: 0.7 10*3/uL (ref 0.1–1.0)
Monocytes Relative: 8 %
Neutro Abs: 6.9 10*3/uL (ref 1.7–7.7)
Neutrophils Relative %: 81 %
Platelets: 149 10*3/uL — ABNORMAL LOW (ref 150–400)
RBC: 3.6 MIL/uL — ABNORMAL LOW (ref 3.87–5.11)
RDW: 13.7 % (ref 11.5–15.5)
WBC: 8.5 10*3/uL (ref 4.0–10.5)
nRBC: 0 % (ref 0.0–0.2)

## 2019-10-06 LAB — LEGIONELLA PNEUMOPHILA SEROGP 1 UR AG: L. pneumophila Serogp 1 Ur Ag: NEGATIVE

## 2019-10-06 IMAGING — DX DG CHEST 2V
2 series · 2 of 2 positions shown · non-contrast
Comparison: Chest radiograph dated [DATE].

CLINICAL DATA: 86-year-old female with pneumonia.

EXAM:
CHEST - 2 VIEW

[chest lat]
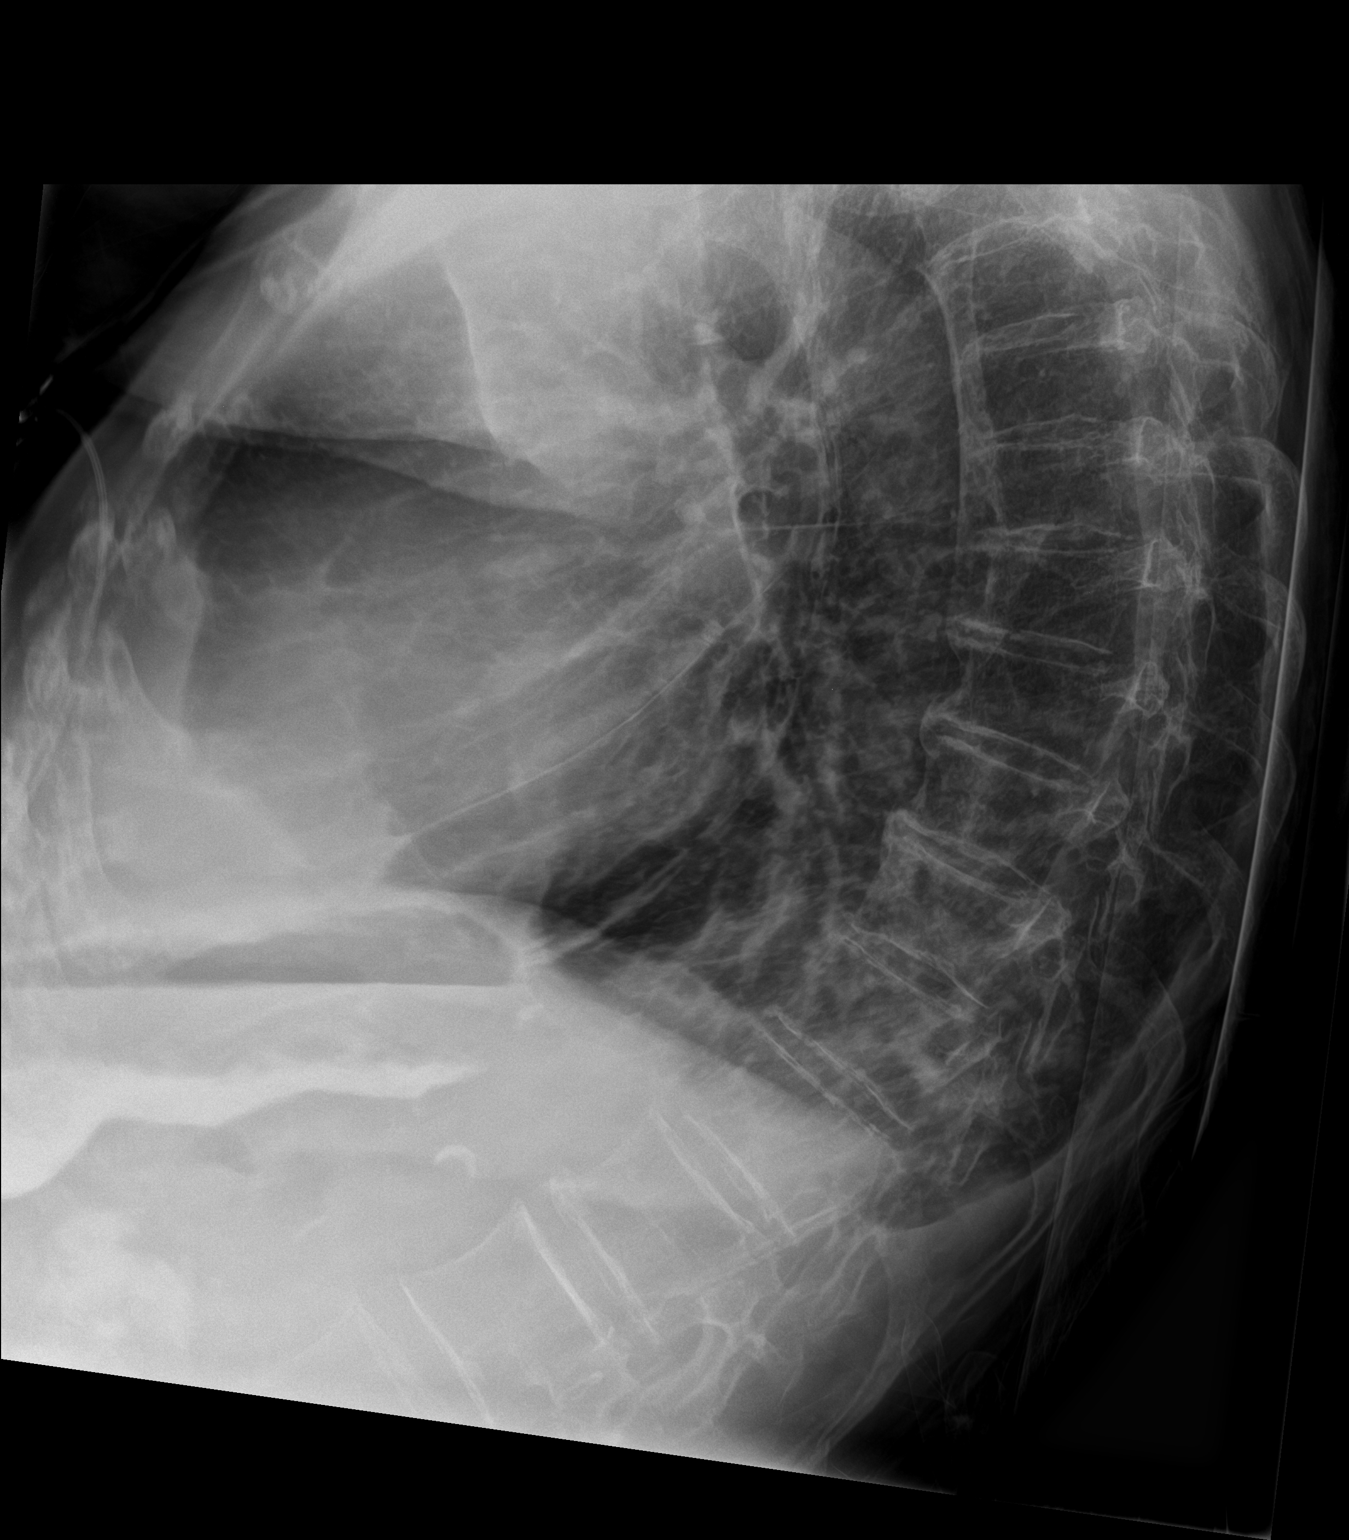

[chest ap]
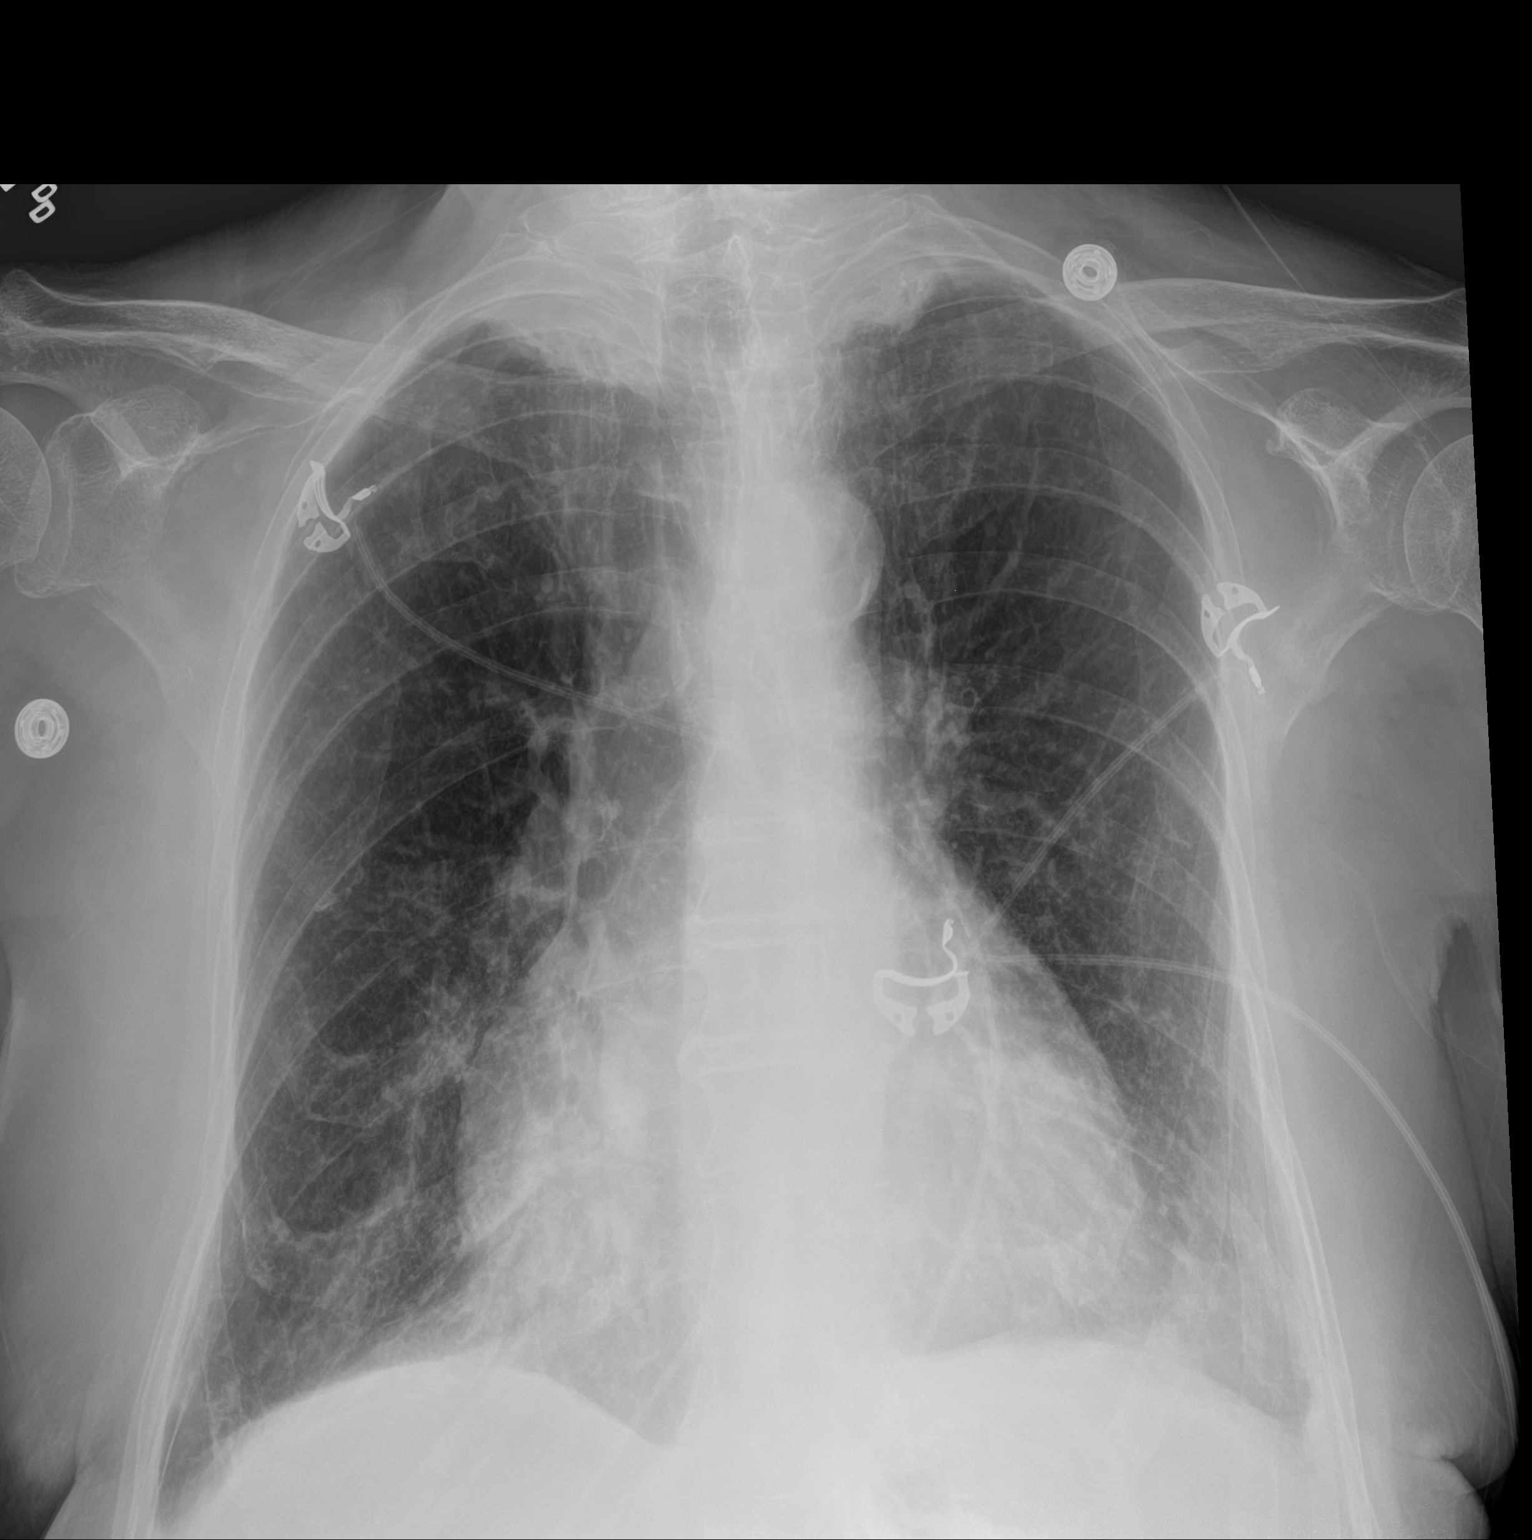

[2 of 2 positions shown; findings below may reference images not displayed]

FINDINGS: Small left and possibly right pleural effusions with associated
partial compressive atelectasis of lung bases. Pneumonia is not
excluded. Clinical correlation is recommended. There is background
of emphysema. Biapical subpleural scarring. No pneumothorax. There
is mild cardiomegaly. Atherosclerotic calcification of the aorta. No
acute osseous pathology. Osteopenia with degenerative changes of
spine.
IMPRESSION: Small bilateral pleural effusions and bibasilar atelectasis versus
infiltrate.

## 2019-10-06 MED ORDER — SACCHAROMYCES BOULARDII 250 MG PO CAPS
250.0000 mg | ORAL_CAPSULE | Freq: Two times a day (BID) | ORAL | Status: DC
  Administered 2019-10-06 – 2019-10-07 (×3): 250 mg via ORAL
  Filled 2019-10-06 (×3): qty 1

## 2019-10-06 MED ORDER — DIPHENHYDRAMINE HCL 12.5 MG/5ML PO ELIX: 12.5000 mg | ORAL_SOLUTION | Freq: Three times a day (TID) | ORAL | Status: DC | PRN

## 2019-10-06 NOTE — Progress Notes (Signed)
Spoke with pt's daughter Gean Birchwood. Inez Catalina expressed her concerns about her mother's condition regarding her throat and ability to swallow. She states she is the prime care giver for her mother even though there is no POA written up. She states that she does not wish for her mother to have any more swallow studies done or esophageal stretching. She states she believes her mother has trouble eating due to the chemotherapy she is/was receiving. She states that her mother's eating gotten worse after undergoing the esophageal stretching the first time. She expressed the need for wanting to speak with the day time doctor and speech therapist to get updates. This RN told her I would make a note for the physician to see and also write this note in the patients chart to bring awareness for her.

## 2019-10-06 NOTE — Progress Notes (Signed)
Modified Barium Swallow Progress Note  Patient Details  Name: Crystal Browning MRN: [id number] Date of Birth: 17-Oct-1933  Today's Date: 10/06/2019  Modified Barium Swallow completed.  Full report located under Chart Review in the Imaging Section.  Brief recommendations include the following:  Clinical Impression  Pt presents with moderate oropharyngeal dysphagia characterized by weak lingual manipulation and bolus cohesiveness resulting in premature spillage, piecemeal deglutition, and oral residue; pharyngeal phase is marked by premature spillage with swallow trigger at the level of the pyriforms for thin liquids (spilling out of valleculae with NTL/HTL), reduced hyolaryngeal excurstion, tongue base retraction, and epiglottic deflection resulting in penetration during and after the swallow with thins and aspiration after the swallow with thins (variably sensed and not completely cleared). Pt with moderate vallecular and mi/mod pyriform residuals with puree and semi solids (overall mild/mod with liquids). Head turns, chin tuck, and modified supraglottic were trialed. Only the chin tuck was found to be marginally effective (inconsistent implementation). Pt had difficulty coordinating respiration/breath hold with modified supraglottic and caused greater aspiration on one event. 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 Pt is at significant risk for aspiration with liquids (fairly consistent with thins) and reduced ability to meet nutritional needs. This study was discussed with the Pt and family (daughter, Bethena Roys on the phone). Recommend D2/chopped and HTL (honey-thick liquids) during meals with aspiration and reflux precautions (Pt will need to swallow several times to help clear pharynx); OK for [employer] protocol (thin water only or ice chips after oral care and only in between meals) with throat clear after each sip and  repeat/dry swallow). Although HTL are not ideal long-term, Pt appeared to tolerate these better with no aspiration observed and greater amount leaving pharynx and into esophagus. The decision to stay on HTL will need to be discussed frequently with Pt/family/MD if inline for Pt's goals of care (may increase risk for dehydration/UTI if not drinking enough). Family reports that Pt has already had difficulty taking in enough by mouth and has had UTIs. Pt may also wish to have a palliative care consult to discuss goals of care long term given the chronic nature of her dysphagia. Pt/family were encouraged to mitigate risks for aspiration PNA by including a good oral care regime at home. SLP will follow during acute stay. She may benefit from OP or HH SLP follow up for dysphagia.    Swallow Evaluation Recommendations       SLP Diet Recommendations: Dysphagia 2 (Fine chop) solids;Honey thick liquids;Free water protocol after oral care   Liquid Administration via: Cup   Medication Administration: Crushed with puree   Supervision: Patient able to self feed   Compensations: Slow rate;Small sips/bites;Multiple dry swallows after each bite/sip;Follow solids with liquid   Postural Changes: Remain semi-upright after after feeds/meals (Comment);Seated upright at 90 degrees   Oral Care Recommendations: Oral care BID   Other Recommendations: Clarify dietary restrictions   Thank you,  Genene Churn, [place]  Guthrie Lemme 10/06/2019,6:17 PM

## 2019-10-06 NOTE — Progress Notes (Signed)
PROGRESS NOTE    Crystal Browning  [id number] DOB: 09/12/1933 DOA: 10/04/2019 PCP: [name], MD     Brief Narrative:  As per H&P written by Dr. Linda Hedges on 10/04/2019 86 y.o. female with medical history significant of HTN, CVA, Laryngeal cancer, a. Fib, hypothyroidism. She just saw her doctor recently and was doing well. She started feeling sick mid-day on the day of admission, with, NV, chills, SOB, fever. She also c/o vulvar pruritis. She presents to [facility] for evaluation.  ED Course: T 103, BP normal to elevated, O2 sat with a low of 87% on RA, 98% on 2L oxygen. Lab reveals leukocytosis to 14.7 with 90/4/5. CXR with diffuse interstial opacities. She was given levasquin IV. She is referred to [facility] for admission for mgt of sepsis secondary to CAP.   Assessment & Plan: 1-sepsis secondary to community-acquired pneumonia: Patient met sepsis criteria on presentation with elevated temperature, elevated respiratory rate and organ dysfunction appreciated as hypoxia. -Continue oxygen supplementation as needed and wean off as tolerated -Continue the use of flutter valve -Continue current antibiotics -Per speech therapy assessment okay to use dysphagia 2 diet and thin liquids.  After discussing with family members decision has been made not to proceed any invasive evaluation for his swallowing.  Will attempt empirical approach modifying diet consistency. -Continue supportive care.  2-Essential hypertension -Stable overall. -Continue home medication regimen -Follow vital signs.  3-chronic atrial fibrillation (HCC) -Rate has remained controlled and is stable. -Continue metoprolol and eliquis -telemetry discontinue on 10/06/2019.  4-vulvar pruritus -patient reported not complaints today -will monitor and if needed empirically treat her for candidiasis.  5-hypothyroidism and -Continue Synthroid.  6-history of gastroesophageal reflux disease -Continue H2  blockers.  7-depression -Continue Lexapro -No suicidal ideation or hallucination currently -Stable mood.  8-DNR -Patient has expressed decision for CODE STATUS and this will be respected.   DVT prophylaxis: Chronically on apixaban. Code Status: DNR Family Communication: Daughter in law updated over the phone. Disposition Plan: Remains inpatient, continue IV antibiotics, follow speech therapy recommendations to mitigate/minimize aspiration.  Dysphagia 2 diet with crushed medications and thin liquids has been ordered.  Follow cultures results.  Consultants:   None  Procedures:   See below for x-ray reports.  Antimicrobials:  Anti-infectives (From admission, onward)   Start     Dose/Rate Route Frequency Ordered Stop   10/05/19 1000  azithromycin (ZITHROMAX) tablet 500 mg     500 mg Oral Daily 10/04/19 2232 10/10/19 0959   10/04/19 2245  cefTRIAXone (ROCEPHIN) 2 g in sodium chloride 0.9 % 100 mL IVPB     2 g 200 mL/hr over 30 Minutes Intravenous Daily at bedtime 10/04/19 2232 10/09/19 2159   10/04/19 1730  levofloxacin (LEVAQUIN) IVPB 500 mg     500 mg 100 mL/hr over 60 Minutes Intravenous  Once 10/04/19 1721 10/04/19 1901      Subjective: Patient spiked fever (low-grade temperature) overnight and is still is present feeling short of breath on exertion and Still having some on productive cough.  No nausea, no vomiting.  Expressed improvement in her swallowing ability with modified diet consistency.  Objective: Vitals:   10/06/19 0503 10/06/19 0920 10/06/19 0922 10/06/19 1005  BP: (!) 150/66   138/60  Pulse: 68   65  Resp: 16     Temp: 98.2 F (36.8 C)     TempSrc: Oral     SpO2: 96% 99% 98%   Weight:      Height:  Intake/Output Summary (Last 24 hours) at 10/06/2019 1350 Last data filed at 10/06/2019 1008 Gross per 24 hour  Intake 3 ml  Output --  Net 3 ml   Filed Weights   10/04/19 1709 10/04/19 2329  Weight: 59 kg 59.5 kg    Examination: General  exam: Alert, awake, oriented x 3; no nausea, no vomiting; reports no chest pain and expressed breathing is improving.  Patient still spiking low grade fever overnight.  Currently off oxygen supplementation. Respiratory system: Diffuse rhonchi (right more than left), no wheezing, no using accessory muscles.  Some tachypnea reported by nursing staff on exertion.   Cardiovascular system: Rate controlled. No rubs. No gallops. No JVD appreciated.  Gastrointestinal system: Abdomen is nondistended, soft and nontender. No organomegaly or masses felt. Normal bowel sounds heard. Central nervous system: Alert and oriented. No focal neurological deficits. Extremities: No cyanosis or clubbing. Skin: No rashes, no petechiae. Psychiatry: Judgement and insight appear normal. Mood & affect appropriate.    Data Reviewed: I have personally reviewed following labs and imaging studies  CBC: Recent Labs  Lab 09/29/19 1607 10/04/19 1719 10/05/19 0441 10/06/19 0428  WBC 4.3 14.7* 13.3* 8.5  NEUTROABS 2.4 13.2*  --  6.9  HGB 12.9 13.6 11.4* 11.5*  HCT 39.2 41.5 36.0 35.3*  MCV 96 98.8 99.2 98.1  PLT 206 192 154 226*   Basic Metabolic Panel: Recent Labs  Lab 09/29/19 1607 10/04/19 1719 10/06/19 0428  NA 142 137 139  K 3.9 3.7 3.5  CL 104 103 107  CO2 26 21* 26  GLUCOSE 84 136* 111*  BUN '9 10 12  ' CREATININE 0.85 0.74 0.68  CALCIUM 9.8 9.0 8.6*   GFR: Estimated Creatinine Clearance: 43.4 mL/min (by C-G formula based on SCr of 0.68 mg/dL)..  Liver Function Tests: Recent Labs  Lab 09/29/19 1607 10/04/19 1719  AST 33 62*  ALT 19 32  ALKPHOS 82 81  BILITOT 0.5 0.9  PROT 6.7 7.8  ALBUMIN 4.4 4.4   Coagulation Profile: Recent Labs  Lab 10/04/19 1719  INR 1.3*   Urine analysis:    Component Value Date/Time   COLORURINE YELLOW 10/05/2019 0415   APPEARANCEUR CLEAR 10/05/2019 0415   APPEARANCEUR Clear 05/14/2019 1341   LABSPEC 1.011 10/05/2019 0415   PHURINE 7.0 10/05/2019 0415    GLUCOSEU NEGATIVE 10/05/2019 0415   HGBUR NEGATIVE 10/05/2019 0415   BILIRUBINUR NEGATIVE 10/05/2019 0415   BILIRUBINUR Negative 05/14/2019 1341   KETONESUR 5 (A) 10/05/2019 0415   PROTEINUR 30 (A) 10/05/2019 0415   UROBILINOGEN negative 07/12/2015 1514   UROBILINOGEN 0.2 06/26/2014 1625   NITRITE NEGATIVE 10/05/2019 0415   LEUKOCYTESUR NEGATIVE 10/05/2019 0415    Recent Results (from the past 240 hour(s))  Blood Culture (routine x 2)     Status: None (Preliminary result)   Collection Time: 10/04/19  5:19 PM   Specimen: Right Antecubital; Blood  Result Value Ref Range Status   Specimen Description RIGHT ANTECUBITAL  Final   Special Requests   Final    BOTTLES DRAWN AEROBIC AND ANAEROBIC Blood Culture results may not be optimal due to an inadequate volume of blood received in culture bottles   Culture   Final    NO GROWTH 2 DAYS Performed at [facility], [address], [place], [place] [address]    Report Status PENDING  Incomplete  Blood Culture (routine x 2)     Status: None (Preliminary result)   Collection Time: 10/04/19  5:35 PM   Specimen: BLOOD  LEFT WRIST  Result Value Ref Range Status   Specimen Description BLOOD LEFT WRIST  Final   Special Requests   Final    BOTTLES DRAWN AEROBIC AND ANAEROBIC Blood Culture adequate volume   Culture   Final    NO GROWTH 2 DAYS Performed at [facility], [address] [place], [place] [address]    Report Status PENDING  Incomplete  Respiratory Panel by RT PCR (Flu A&B, Covid) - Nasopharyngeal Swab     Status: None   Collection Time: 10/04/19  5:52 PM   Specimen: Nasopharyngeal Swab  Result Value Ref Range Status   SARS Coronavirus 2 by RT PCR NEGATIVE NEGATIVE Final    Comment: (NOTE) SARS-CoV-2 target nucleic acids are NOT DETECTED. The SARS-CoV-2 RNA is generally detectable in upper respiratoy specimens during the acute phase of infection. The lowest concentration of SARS-CoV-2 viral copies this assay can detect is 131  copies/mL. A negative result does not preclude SARS-Cov-2 infection and should not be used as the sole basis for treatment or other patient management decisions. A negative result may occur with  improper specimen collection/handling, submission of specimen other than nasopharyngeal swab, presence of viral mutation(s) within the areas targeted by this assay, and inadequate number of viral copies (<131 copies/mL). A negative result must be combined with clinical observations, patient history, and epidemiological information. The expected result is Negative. Fact Sheet for Patients:  PinkCheek.be Fact Sheet for Healthcare Providers:  GravelBags.it This test is not yet ap proved or cleared by the [place] FDA and  has been authorized for detection and/or diagnosis of SARS-CoV-2 by FDA under an Emergency Use Authorization (EUA). This EUA will remain  in effect (meaning this test can be used) for the duration of the COVID-19 declaration under Section 564(b)(1) of the Act, 21 U.S.C. section 360bbb-3(b)(1), unless the authorization is terminated or revoked sooner.    Influenza A by PCR NEGATIVE NEGATIVE Final   Influenza B by PCR NEGATIVE NEGATIVE Final    Comment: (NOTE) The Xpert Xpress SARS-CoV-2/FLU/RSV assay is intended as an aid in  the diagnosis of influenza from Nasopharyngeal swab specimens and  should not be used as a sole basis for treatment. Nasal washings and  aspirates are unacceptable for Xpert Xpress SARS-CoV-2/FLU/RSV  testing. Fact Sheet for Patients: PinkCheek.be Fact Sheet for Healthcare Providers: GravelBags.it This test is not yet approved or cleared by the [place] FDA and  has been authorized for detection and/or diagnosis of SARS-CoV-2 by  FDA under an Emergency Use Authorization (EUA). This EUA will remain  in effect (meaning this test can  be used) for the duration of the  Covid-19 declaration under Section 564(b)(1) of the Act, 21  U.S.C. section 360bbb-3(b)(1), unless the authorization is  terminated or revoked. Performed at [facility], [address], [place], [place] [address]   Urine culture     Status: Abnormal   Collection Time: 10/05/19  4:15 AM   Specimen: In/Out Cath Urine  Result Value Ref Range Status   Specimen Description   Final    IN/OUT CATH URINE Performed at [facility], [address] [place], [place] [address]    Special Requests   Final    NONE Performed at [facility], [address], [place], [place] [address]    Culture MULTIPLE SPECIES PRESENT, SUGGEST RECOLLECTION (A)  Final   Report Status 10/06/2019 FINAL  Final     Radiology Studies: [facility] Chest Port 1 View  Result Date: 10/04/2019 CLINICAL  DATA:  Patient complains of nausea/chills. Code sepsis EXAM: PORTABLE CHEST 1 VIEW COMPARISON:  Chest radiograph 11/12/2016 FINDINGS: Limited exam due to patient chin projecting over the upper chest. Visualized cardiomediastinal contours appear stable. Low lung volumes. Diffuse bilateral coarse interstitial opacities. No definite pneumothorax or significant pleural effusion. No acute finding in the visualized skeleton. IMPRESSION: Diffuse bilateral coarse interstitial opacities could represent edema or atypical (viral) infection in the acute setting, versus progression of chronic bronchitic changes and scarring. Electronically Signed   By: Audie Pinto M.D.   On: 10/04/2019 17:39   Scheduled Meds: . amLODipine  2.5 mg Oral Daily  . apixaban  5 mg Oral BID  . azithromycin  500 mg Oral Daily  . escitalopram  10 mg Oral Daily  . famotidine  20 mg Oral Q12H  . levothyroxine  50 mcg Oral Q0600  . metoprolol succinate  100 mg Oral Daily  . polyethylene glycol  17 g Oral Daily  . sodium chloride flush  3 mL Intravenous Q12H   Continuous Infusions: . sodium chloride    . cefTRIAXone (ROCEPHIN)  IV 2  g (10/05/19 2027)     LOS: 2 days    Time spent: 30 minutes   Barton Dubois, MD [facility] Pager [phone]   10/06/2019, 1:50 PM

## 2019-10-07 MED ORDER — RESOURCE THICKENUP CLEAR PO POWD
ORAL | Status: DC | PRN
  Filled 2019-10-07: qty 125

## 2019-10-07 MED ORDER — POLYETHYLENE GLYCOL 3350 17 GM/SCOOP PO POWD: 17.0000 g | Freq: Two times a day (BID) | ORAL | 3 refills | Status: DC

## 2019-10-07 MED ORDER — CEFDINIR 300 MG PO CAPS: 300.0000 mg | ORAL_CAPSULE | Freq: Two times a day (BID) | ORAL | 0 refills | Status: AC

## 2019-10-07 MED ORDER — MUCINEX 600 MG PO TB12: 600.0000 mg | ORAL_TABLET | Freq: Two times a day (BID) | ORAL | 0 refills | Status: AC

## 2019-10-07 MED ORDER — ACETAMINOPHEN 325 MG PO TABS: 650.0000 mg | ORAL_TABLET | Freq: Four times a day (QID) | ORAL | 0 refills | Status: AC | PRN

## 2019-10-07 MED ORDER — DOXYCYCLINE HYCLATE 100 MG PO TABS: 100.0000 mg | ORAL_TABLET | Freq: Two times a day (BID) | ORAL | 0 refills | Status: AC

## 2019-10-07 NOTE — Progress Notes (Signed)
Discharge instructions reviewed with patient and patient daughter.  Both verbalized understanding of instructions.  Patient discharged home with family in stable condition.

## 2019-10-07 NOTE — Discharge Instructions (Signed)
1)Please take medications as prescribed 2)-please repeat CBC and BMP Blood Test with your Primary care provider around Monday 10/12/19 3)-You are taking apixaban/Eliquis which is a blood thinner so Avoid ibuprofen/Advil/Aleve/Motrin/Goody Powders/Naproxen/BC powders/Meloxicam/Diclofenac/Indomethacin and other Nonsteroidal anti-inflammatory medications as these will make you more likely to bleed and can cause stomach ulcers, can also cause Kidney problems.  4)Diet---- Dysphagia 2 (Fine chop) solids;-----Honey thick liquids;--Free water protocol after oral care  Liquid Administration via: Cup  Medication Administration: Crushed with puree  Supervision: Patient able to self feed  Compensations: Slow rate;Small sips/bites;Multiple dry swallows after each bite/sip;Follow solids with liquid  Postural Changes: Remain semi-upright after after feeds/meals (Comment);Seated upright at 90 degrees

## 2019-10-07 NOTE — Care Management Important Message (Signed)
Important Message  Patient Details  Name: Crystal Browning MRN: [id number] Date of Birth: [birth year]   Medicare Important Message Given:  Yes     Tommy Medal 10/07/2019, 1:47 PM

## 2019-10-07 NOTE — Discharge Summary (Signed)
Crystal Browning, is a 86 y.o. female  DOB [birth year]  MRN [id number].  Admission date:  10/04/2019  Admitting Physician  Neena Rhymes, MD  Discharge Date:  10/07/2019   Primary MD  [name], MD  Recommendations for primary care physician for things to follow:   -1)Please take medications as prescribed 2)-please repeat CBC and BMP Blood Test with your Primary care provider around Monday 10/12/19 3)-You are taking apixaban/Eliquis which is a blood thinner so Avoid ibuprofen/Advil/Aleve/Motrin/Goody Powders/Naproxen/BC powders/Meloxicam/Diclofenac/Indomethacin and other Nonsteroidal anti-inflammatory medications as these will make you more likely to bleed and can cause stomach ulcers, can also cause Kidney problems.  4)Diet---- Dysphagia 2 (Fine chop) solids;-----Honey thick liquids;--Free water protocol after oral care  Liquid Administration via: Cup  Medication Administration: Crushed with puree  Supervision: Patient able to self feed  Compensations: Slow rate;Small sips/bites;Multiple dry swallows after each bite/sip;Follow solids with liquid  Postural Changes: Remain semi-upright after after feeds/meals (Comment);Seated upright at 90 degrees  Admission Diagnosis  CAP (community acquired pneumonia) [J18.9] Community acquired pneumonia of left lower lobe of lung [J18.9]   Discharge Diagnosis  CAP (community acquired pneumonia) [J18.9] Community acquired pneumonia of left lower lobe of lung [J18.9]    Active Problems:   Essential hypertension   Atrial fibrillation ([place])   CAP (community acquired pneumonia)      Past Medical History:  Diagnosis Date  . Bilateral lower extremity edema 10/30/2016  . Cancer ([place]) 03/28/11   SCCA of the Supraglottic Larynx (T2NoMo)    ; S/P Chemoradiation therapy from 05/09/05 thru 06/29/05  . Depression    History of Depression- Lexapro therapy  .  Diverticulosis 02/2005   History of diverticulosis with admission from 8/18 - 03/04/2005  . Esophageal stricture 03/03/2018  . Generalized abdominal pain 07/17/2015  . History of chemotherapy    S/P chemotherapy with Dr. Sonny Dandy -2006  . Hypertension   . Hypothyroid   . Osteoporosis    History of Osteoporosis with one year of Actonel only  . Radiation    S/P radiation thrapy -06/2005  . TIA (transient ischemic attack)    History of TIA's with no residual effect    Past Surgical History:  Procedure Laterality Date  . CARDIOVERSION N/A 11/23/2016   Procedure: CARDIOVERSION;  Surgeon: Pixie Casino, MD;  Location: [facility] ENDOSCOPY;  Service: Cardiovascular;  Laterality: N/A;  . CATARACT EXTRACTION, BILATERAL    . DIRECT LARYNGOSCOPY  03/27/2005   S/P direct laryngoscopy, esophagoscopy and biopsy with Dr. Constance Holster  . TOTAL ABDOMINAL HYSTERECTOMY W/ BILATERAL SALPINGOOPHORECTOMY  1986  . TUBAL LIGATION         HPI  from the history and physical done on the day of admission:   Chief Complaint: N/V, fever, SOB  HPI: Crystal Browning is a 86 y.o. female with medical history significant of HTN, CVA, Laryngeal cancer, a. Fib, hypothyroidism. She just saw her doctor recently and was doing well. She started feeling sick mid-day on the day of admission, with, NV, chills, SOB, fever.  She also c/o vulvar pruritis. She [facility]  ED Course: T 103, BP normal to elevated, O2 sat with a low of 87% on RA, 98% on 2L oxygen. Lab reveals leukocytosis to 14.7 with 90/4/5. CXR with diffuse interstial opacities. She was given levasquin IV. She is referred to [facility] for admission for mgt of CAP.   Review of Systems: As per HPI otherwise 10 point review of systems negative.     Hospital Course:     Brief Narrative:  As per H&P written by Dr. Linda Hedges on 10/04/2019 86 y.o.femalewith medical history significant ofHTN, CVA, Laryngeal cancer, a. Fib, hypothyroidism. She just saw her doctor recently  and was doing well. She started feeling sick mid-day on the day of admission, with, NV, chills, SOB, fever. She also c/o vulvar pruritis. She presents to [facility] evaluation.  ED Course:T 103, BP normal to elevated, O2 sat with a low of 87% on RA, 98% on 2L oxygen. Lab reveals leukocytosis to 14.7 with 90/4/5. CXR with diffuse interstial opacities. She was given levasquin IV. She is referred to [facility] for admission for mgt of sepsis secondary to CAP.  Assessment & Plan:  1)Sepsis secondary to community-acquired pneumonia: Patient met sepsis criteria on presentation with elevated temperature, elevated respiratory rate and organ dysfunction appreciated as hypoxia. --She has been weaned off oxygen at this time hypoxia is resolved -Respiratory status improved significantly -Continue the use of flutter valve -Treated with azithromycin and Rocephin okay to discharge on Omnicef and doxycycline   2-Essential hypertension -Stable overall. -Continue amlodipine and metoprolol  3-chronic atrial fibrillation (HCC) -Rate has remained controlled and is stable. -Continue metoprolol and eliquis  4-vulvar pruritus -patient reported not complaints today  5-hypothyroidism and -Continue Synthroid.  6-history of gastroesophageal reflux disease -Continue H2 blockers.  7-depression -Continue Lexapro -No suicidal ideation or hallucination currently -Stable mood.  8-DNR -Patient has expressed decision for CODE STATUS and this will be respected.  9)Acute hypoxic respiratory failure--- resolved   10)Dysphagia-  Dysphagia 2 (Fine chop) solids;-----Honey thick liquids--- --Speech therapy evaluation appreciated, After discussing with family members decision has been made not to proceed any invasive evaluation for his swallowing.  Will attempt empirical approach modifying diet consistency. -Continue supportive care.    DVT prophylaxis: Chronically on apixaban. Code Status: DNR Family  Communication: Daughter in law updated over the phone. Disposition Plan: Remains inpatient, continue IV antibiotics, follow speech therapy recommendations to mitigate/minimize aspiration.  Dysphagia 2 diet with crushed medications and thin liquids has been ordered.  Follow cultures results.  Consultants:   None  Procedures:   See below for x-ray reports.   Discharge Condition: -Much improved/stable  Follow UP--- PCP as advised  Diet and Activity recommendation:  As advised  Discharge Instructions    Discharge Instructions    Call MD for:  difficulty breathing, headache or visual disturbances   Complete by: As directed    Call MD for:  persistant dizziness or light-headedness   Complete by: As directed    Call MD for:  persistant nausea and vomiting   Complete by: As directed    Call MD for:  severe uncontrolled pain   Complete by: As directed    Call MD for:  temperature >100.4   Complete by: As directed    Diet - low sodium heart healthy   Complete by: As directed    Discharge instructions   Complete by: As directed    1)Please take medications as prescribed 2)-please repeat CBC  and BMP Blood Test with your Primary care provider around Monday 10/12/19 3)-You are taking apixaban/Eliquis which is a blood thinner so Avoid ibuprofen/Advil/Aleve/Motrin/Goody Powders/Naproxen/BC powders/Meloxicam/Diclofenac/Indomethacin and other Nonsteroidal anti-inflammatory medications as these will make you more likely to bleed and can cause stomach ulcers, can also cause Kidney problems.  4)Diet---- Dysphagia 2 (Fine chop) solids;-----Honey thick liquids;--Free water protocol after oral care  Liquid Administration via: Cup  Medication Administration: Crushed with puree  Supervision: Patient able to self feed  Compensations: Slow rate;Small sips/bites;Multiple dry swallows after each bite/sip;Follow solids with liquid  Postural Changes: Remain semi-upright after after feeds/meals  (Comment);Seated upright at 90 degrees   For home use only DME Nebulizer machine   Complete by: As directed    Patient needs a nebulizer to treat with the following condition: COPD (chronic obstructive pulmonary disease) (HCC)   Length of Need: Lifetime   Increase activity slowly   Complete by: As directed         Discharge Medications     Allergies as of 10/07/2019      Reactions   Tuberculin Tests Swelling   Vit D-vit E-safflower Oil    Paroxetine Hcl Rash      Medication List    TAKE these medications   acetaminophen 325 MG tablet Commonly known as: TYLENOL Take 2 tablets (650 mg total) by mouth every 6 (six) hours as needed for mild pain, fever or headache.   amLODipine 2.5 MG tablet Commonly known as: NORVASC Take 1 tablet (2.5 mg total) by mouth daily.   apixaban 5 MG Tabs tablet Commonly known as: Eliquis Take 1 tablet (5 mg total) by mouth 2 (two) times daily.   B COMPLEX-VITAMIN B12 PO Take by mouth.   cefdinir 300 MG capsule Commonly known as: OMNICEF Take 1 capsule (300 mg total) by mouth 2 (two) times daily for 5 days.   clotrimazole-betamethasone cream Commonly known as: LOTRISONE APPLY TO AFFECTED AREA TWICE A DAY   doxycycline 100 MG tablet Commonly known as: VIBRA-TABS Take 1 tablet (100 mg total) by mouth 2 (two) times daily for 5 days.   escitalopram 10 MG tablet Commonly known as: LEXAPRO Take 1 tablet (10 mg total) by mouth daily.   famotidine 20 MG tablet Commonly known as: PEPCID Take 1 tablet (20 mg total) by mouth every 12 (twelve) hours.   levothyroxine 50 MCG tablet Commonly known as: SYNTHROID Take 1 tablet (50 mcg total) by mouth daily.   metoprolol succinate 100 MG 24 hr tablet Commonly known as: TOPROL-XL Take with or immediately following a meal.   Mucinex 600 MG 12 hr tablet Generic drug: guaiFENesin Take 1 tablet (600 mg total) by mouth 2 (two) times daily for 10 days.   polyethylene glycol powder 17 GM/SCOOP  powder Commonly known as: GLYCOLAX/MIRALAX Take 17 g by mouth 2 (two) times daily. For constipation   triamcinolone cream 0.1 % Commonly known as: KENALOG APPLY TO AFFECTED AREA TWICE A DAY   Vitamin D (Ergocalciferol) 1.25 MG (50000 UNIT) Caps capsule Commonly known as: DRISDOL Take 1 capsule (50,000 Units total) by mouth every 7 (seven) days.            Durable Medical Equipment  (From admission, onward)         Start     Ordered   10/07/19 0000  For home use only DME Nebulizer machine    Question Answer Comment  Patient needs a nebulizer to treat with the following condition COPD (chronic obstructive pulmonary disease) (  [place])   Length of Need Lifetime      10/07/19 1602         Major procedures and Radiology Reports - PLEASE review detailed and final reports for all details, in brief -   DG Chest 2 View  Result Date: 10/06/2019 CLINICAL DATA:  86 year old female with pneumonia. EXAM: CHEST - 2 VIEW COMPARISON:  Chest radiograph dated 10/04/2019. FINDINGS: Small left and possibly right pleural effusions with associated partial compressive atelectasis of lung bases. Pneumonia is not excluded. Clinical correlation is recommended. There is background of emphysema. Biapical subpleural scarring. No pneumothorax. There is mild cardiomegaly. Atherosclerotic calcification of the aorta. No acute osseous pathology. Osteopenia with degenerative changes of spine. IMPRESSION: Small bilateral pleural effusions and bibasilar atelectasis versus infiltrate. Electronically Signed   By: Anner Crete M.D.   On: 10/06/2019 15:21   DG Chest Port 1 View  Result Date: 10/04/2019 CLINICAL DATA:  Patient complains of nausea/chills. Code sepsis EXAM: PORTABLE CHEST 1 VIEW COMPARISON:  Chest radiograph 11/12/2016 FINDINGS: Limited exam due to patient chin projecting over the upper chest. Visualized cardiomediastinal contours appear stable. Low lung volumes. Diffuse bilateral coarse interstitial  opacities. No definite pneumothorax or significant pleural effusion. No acute finding in the visualized skeleton. IMPRESSION: Diffuse bilateral coarse interstitial opacities could represent edema or atypical (viral) infection in the acute setting, versus progression of chronic bronchitic changes and scarring. Electronically Signed   By: Audie Pinto M.D.   On: 10/04/2019 17:39   DG Swallowing Func-Speech Pathology  Result Date: 10/07/2019 Objective Swallowing Evaluation: Type of Study: MBS-Modified Barium Swallow Study  Patient Details Name: Crystal Browning MRN: [id number] Date of Birth: 02-08-1934 Today's Date: 10/07/2019 Time: SLP Start Time (ACUTE ONLY): 1430 -SLP Stop Time (ACUTE ONLY): 1510 SLP Time Calculation (min) (ACUTE ONLY): 40 min Past Medical History: Past Medical History: Diagnosis Date . Bilateral lower extremity edema 10/30/2016 . Cancer ([place]) 03/28/11  SCCA of the Supraglottic Larynx (T2NoMo)    ; S/P Chemoradiation therapy from 05/09/05 thru 06/29/05 . Depression   History of Depression- Lexapro therapy . Diverticulosis 02/2005  History of diverticulosis with admission from 8/18 - 03/04/2005 . Esophageal stricture 03/03/2018 . Generalized abdominal pain 07/17/2015 . History of chemotherapy   S/P chemotherapy with Dr. Sonny Dandy -2006 . Hypertension  . Hypothyroid  . Osteoporosis   History of Osteoporosis with one year of Actonel only . Radiation   S/P radiation thrapy -06/2005 . TIA (transient ischemic attack)   History of TIA's with no residual effect Past Surgical History: Past Surgical History: Procedure Laterality Date . CARDIOVERSION N/A 11/23/2016  Procedure: CARDIOVERSION;  Surgeon: Pixie Casino, MD;  Location: [facility] ENDOSCOPY;  Service: Cardiovascular;  Laterality: N/A; . CATARACT EXTRACTION, BILATERAL   . DIRECT LARYNGOSCOPY  03/27/2005  S/P direct laryngoscopy, esophagoscopy and biopsy with Dr. Constance Holster . TOTAL ABDOMINAL HYSTERECTOMY W/ BILATERAL SALPINGOOPHORECTOMY  1986 . TUBAL LIGATION   HPI: Crystal Browning is a 86 y.o. female with medical history significant of HTN, CVA, Laryngeal cancer, a. Fib, hypothyroidism. She just saw her doctor recently and was doing well. She started feeling sick mid-day on the day of admission, with, NV, chills, SOB, fever. She also c/o vulvar pruritis. She presents to [facility]. T 103, BP normal to elevated, O2 sat with a low of 87% on RA, 98% on 2L oxygen. Lab reveals leukocytosis to 14.7 with 90/4/5. CXR with diffuse interstial opacities. She was given levasquin IV. She is referred to [facility] for admission for  mgt of CAP. BSE requested.  Subjective: "She has trouble with bread and meat." -daughter in law Assessment / Plan / Recommendation CHL IP CLINICAL IMPRESSIONS 10/06/2019 Clinical Impression Pt presents with moderate oropharyngeal dysphagia characterized by weak lingual manipulation and bolus cohesiveness resulting in premature spillage, piecemeal deglutition, and oral residue; pharyngeal phase is marked by premature spillage with swallow trigger at the level of the pyriforms for thin liquids (spilling out of valleculae with NTL/HTL), reduced hyolaryngeal excurstion, tongue base retraction, and epiglottic deflection resulting in penetration during and after the swallow with thins and aspiration after the swallow with thins (variably sensed and not completely cleared). Pt with moderate vallecular and mi/mod pyriform residuals with puree and semi solids (overall mild/mod with liquids). Head turns, chin tuck, and modified supraglottic were trialed. Only the chin tuck was found to be marginally effective (inconsistent implementation). Pt had difficulty coordinating respiration/breath hold with modified supraglottic and caused greater aspiration on one event. 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 Pt is at significant risk for aspiration with liquids (fairly consistent with thins) and reduced  ability to meet nutritional needs. This study was discussed with the Pt and family (daughter, Bethena Roys on the phone). Recommend D2/chopped and HTL (honey-thick liquids) during meals with aspiration and reflux precautions (Pt will need to swallow several times to help clear pharynx); OK for [employer] protocol (thin water only or ice chips after oral care and only in between meals) with throat clear after each sip and repeat/dry swallow). Although HTL are not ideal long-term, Pt appeared to tolerate these better with no aspiration observed and greater amount leaving pharynx and into esophagus. The decision to stay on HTL will need to be discussed frequently with Pt/family/MD if inline for Pt's goals of care (may increase risk for dehydration/UTI if not drinking enough). Family reports that Pt has already had difficulty taking in enough by mouth and has had UTIs. Pt may also wish to have a palliative care consult to discuss goals of care long term given the chronic nature of her dysphagia. Pt/family were encouraged to mitigate risks for aspiration PNA by including a good oral care regime at home. SLP will follow during acute stay. She may benefit from OP or HH SLP follow up for dysphagia.  SLP Visit Diagnosis Dysphagia, oropharyngeal phase (R13.12) Attention and concentration deficit following -- Frontal lobe and executive function deficit following -- Impact on safety and function Moderate aspiration risk;Severe aspiration risk;Risk for inadequate nutrition/hydration   CHL IP TREATMENT RECOMMENDATION 10/06/2019 Treatment Recommendations Therapy as outlined in treatment plan below   Prognosis 10/06/2019 Prognosis for Safe Diet Advancement Fair Barriers to Reach Goals Severity of deficits;Time post onset Barriers/Prognosis Comment -- CHL IP DIET RECOMMENDATION 10/06/2019 SLP Diet Recommendations Dysphagia 2 (Fine chop) solids;Honey thick liquids;Free water protocol after oral care Liquid Administration via Cup Medication  Administration Crushed with puree Compensations Slow rate;Small sips/bites;Multiple dry swallows after each bite/sip;Follow solids with liquid Postural Changes Remain semi-upright after after feeds/meals (Comment);Seated upright at 90 degrees   CHL IP OTHER RECOMMENDATIONS 10/06/2019 Recommended Consults -- Oral Care Recommendations Oral care BID Other Recommendations Clarify dietary restrictions   CHL IP FOLLOW UP RECOMMENDATIONS 10/05/2019 Follow up Recommendations None   CHL IP FREQUENCY AND DURATION 10/06/2019 Speech Therapy Frequency (ACUTE ONLY) min 2x/week Treatment Duration 1 week      CHL IP ORAL PHASE 10/06/2019 Oral Phase Impaired Oral - Pudding Teaspoon -- Oral - Pudding Cup -- Oral - Honey Teaspoon --  Oral - Honey Cup -- Oral - Nectar Teaspoon -- Oral - Nectar Cup -- Oral - Nectar Straw -- Oral - Thin Teaspoon -- Oral - Thin Cup -- Oral - Thin Straw -- Oral - Puree -- Oral - Mech Soft -- Oral - Regular -- Oral - Multi-Consistency -- Oral - Pill -- Oral Phase - Comment --  CHL IP PHARYNGEAL PHASE 10/06/2019 Pharyngeal Phase Impaired Pharyngeal- Pudding Teaspoon -- Pharyngeal -- Pharyngeal- Pudding Cup -- Pharyngeal -- Pharyngeal- Honey Teaspoon -- Pharyngeal -- Pharyngeal- Honey Cup -- Pharyngeal -- Pharyngeal- Nectar Teaspoon -- Pharyngeal -- Pharyngeal- Nectar Cup Delayed swallow initiation-vallecula;Reduced epiglottic inversion;Reduced anterior laryngeal mobility;Reduced laryngeal elevation;Reduced tongue base retraction;Pharyngeal residue - valleculae;Pharyngeal residue - pyriform Pharyngeal -- Pharyngeal- Nectar Straw -- Pharyngeal -- Pharyngeal- Thin Teaspoon Delayed swallow initiation-pyriform sinuses;Reduced epiglottic inversion;Reduced anterior laryngeal mobility;Reduced laryngeal elevation;Reduced airway/laryngeal closure;Penetration/Aspiration during swallow;Penetration/Apiration after swallow;Trace aspiration;Pharyngeal residue - valleculae;Pharyngeal residue - pyriform Pharyngeal Material enters  airway, CONTACTS cords and then ejected out;Material enters airway, CONTACTS cords and not ejected out Pharyngeal- Thin Cup Delayed swallow initiation-pyriform sinuses;Reduced epiglottic inversion;Reduced anterior laryngeal mobility;Reduced laryngeal elevation;Reduced airway/laryngeal closure;Penetration/Aspiration during swallow;Penetration/Apiration after swallow;Trace aspiration;Moderate aspiration;Pharyngeal residue - valleculae;Pharyngeal residue - pyriform Pharyngeal Material enters airway, passes BELOW cords without attempt by patient to eject out (silent aspiration) Pharyngeal- Thin Straw -- Pharyngeal -- Pharyngeal- Puree Delayed swallow initiation-vallecula;Reduced pharyngeal peristalsis;Reduced epiglottic inversion;Reduced laryngeal elevation;Reduced tongue base retraction;Pharyngeal residue - valleculae;Pharyngeal residue - pyriform Pharyngeal -- Pharyngeal- Mechanical Soft Delayed swallow initiation-vallecula;Reduced pharyngeal peristalsis;Reduced epiglottic inversion;Reduced laryngeal elevation;Reduced tongue base retraction;Pharyngeal residue - valleculae;Pharyngeal residue - pyriform Pharyngeal -- Pharyngeal- Regular -- Pharyngeal -- Pharyngeal- Multi-consistency -- Pharyngeal -- Pharyngeal- Pill Delayed swallow initiation-vallecula Pharyngeal -- Pharyngeal Comment --  CHL IP CERVICAL ESOPHAGEAL PHASE 10/06/2019 Cervical Esophageal Phase WFL Pudding Teaspoon -- Pudding Cup -- Honey Teaspoon -- Honey Cup -- Nectar Teaspoon -- Nectar Cup -- Nectar Straw -- Thin Teaspoon -- Thin Cup -- Thin Straw -- Puree -- Mechanical Soft -- Regular -- Multi-consistency -- Pill -- Cervical Esophageal Comment -- Thank you, Genene Churn, [place] PORTER,DABNEY 10/07/2019, 12:05 PM              Micro Results   Recent Results (from the past 240 hour(s))  Blood Culture (routine x 2)     Status: None (Preliminary result)   Collection Time: 10/04/19  5:19 PM   Specimen: Right Antecubital; Blood  Result  Value Ref Range Status   Specimen Description RIGHT ANTECUBITAL  Final   Special Requests   Final    BOTTLES DRAWN AEROBIC AND ANAEROBIC Blood Culture results may not be optimal due to an inadequate volume of blood received in culture bottles   Culture   Final    NO GROWTH 3 DAYS Performed at [facility], [address] [place], [place] [address]    Report Status PENDING  Incomplete  Blood Culture (routine x 2)     Status: None (Preliminary result)   Collection Time: 10/04/19  5:35 PM   Specimen: BLOOD LEFT WRIST  Result Value Ref Range Status   Specimen Description BLOOD LEFT WRIST  Final   Special Requests   Final    BOTTLES DRAWN AEROBIC AND ANAEROBIC Blood Culture adequate volume   Culture   Final    NO GROWTH 3 DAYS Performed at [facility], [address] [place], [place] [address]    Report Status PENDING  Incomplete  Respiratory Panel by RT PCR (Flu A&B, Covid) - Nasopharyngeal Swab     Status:  None   Collection Time: 10/04/19  5:52 PM   Specimen: Nasopharyngeal Swab  Result Value Ref Range Status   SARS Coronavirus 2 by RT PCR NEGATIVE NEGATIVE Final    Comment: (NOTE) SARS-CoV-2 target nucleic acids are NOT DETECTED. The SARS-CoV-2 RNA is generally detectable in upper respiratoy specimens during the acute phase of infection. The lowest concentration of SARS-CoV-2 viral copies this assay can detect is 131 copies/mL. A negative result does not preclude SARS-Cov-2 infection and should not be used as the sole basis for treatment or other patient management decisions. A negative result may occur with  improper specimen collection/handling, submission of specimen other than nasopharyngeal swab, presence of viral mutation(s) within the areas targeted by this assay, and inadequate number of viral copies (<131 copies/mL). A negative result must be combined with clinical observations, patient history, and epidemiological information. The expected result is Negative. Fact  Sheet for Patients:  PinkCheek.be Fact Sheet for Healthcare Providers:  GravelBags.it This test is not yet ap proved or cleared by the [place] FDA and  has been authorized for detection and/or diagnosis of SARS-CoV-2 by FDA under an Emergency Use Authorization (EUA). This EUA will remain  in effect (meaning this test can be used) for the duration of the COVID-19 declaration under Section 564(b)(1) of the Act, 21 U.S.C. section 360bbb-3(b)(1), unless the authorization is terminated or revoked sooner.    Influenza A by PCR NEGATIVE NEGATIVE Final   Influenza B by PCR NEGATIVE NEGATIVE Final    Comment: (NOTE) The Xpert Xpress SARS-CoV-2/FLU/RSV assay is intended as an aid in  the diagnosis of influenza from Nasopharyngeal swab specimens and  should not be used as a sole basis for treatment. Nasal washings and  aspirates are unacceptable for Xpert Xpress SARS-CoV-2/FLU/RSV  testing. Fact Sheet for Patients: PinkCheek.be Fact Sheet for Healthcare Providers: GravelBags.it This test is not yet approved or cleared by the [place] FDA and  has been authorized for detection and/or diagnosis of SARS-CoV-2 by  FDA under an Emergency Use Authorization (EUA). This EUA will remain  in effect (meaning this test can be used) for the duration of the  Covid-19 declaration under Section 564(b)(1) of the Act, 21  U.S.C. section 360bbb-3(b)(1), unless the authorization is  terminated or revoked. Performed at [facility], [address], [place], [place] [address]   Urine culture     Status: Abnormal   Collection Time: 10/05/19  4:15 AM   Specimen: In/Out Cath Urine  Result Value Ref Range Status   Specimen Description   Final    IN/OUT CATH URINE Performed at [facility], [address], [place],  [address]    Special Requests   Final    NONE Performed at [facility], [address] [place],  [address]    Culture MULTIPLE SPECIES PRESENT, SUGGEST RECOLLECTION (A)  Final   Report Status 10/06/2019 FINAL  Final       Today   Subjective    Dolores Hoose today has no new complaints - No fever  Or chills   No Nausea, Vomiting or Diarrhea        Patient has been seen and examined prior to discharge   Objective   Blood pressure (!) 111/47, pulse 61, temperature 97.7 F (36.5 C), temperature source Oral, resp. rate 19, height '5\' 1"'  (1.549 m), weight 59.5 kg, SpO2 97 %.   Intake/Output Summary (Last 24 hours) at 10/07/2019 1606 Last data filed at 10/07/2019 0800 Gross per 24  hour  Intake 120 ml  Output --  Net 120 ml    Exam Gen:- Awake Alert, no acute distress  HEENT:- [place].AT, No sclera icterus  Neck-Supple Neck,No JVD,.  Lungs-improved air movement, no wheezing  CV- S1, S2 normal, regular Abd-  +ve B.Sounds, Abd Soft, No tenderness,    Extremity/Skin:- No  edema,   good pulses Psych-affect is appropriate, oriented x3 Neuro-no new focal deficits, no tremors    Data Review   CBC w Diff:  Lab Results  Component Value Date   WBC 8.5 10/06/2019   HGB 11.5 (L) 10/06/2019   HGB 12.9 09/29/2019   HCT 35.3 (L) 10/06/2019   HCT 39.2 09/29/2019   PLT 149 (L) 10/06/2019   PLT 206 09/29/2019   LYMPHOPCT 10 10/06/2019   MONOPCT 8 10/06/2019   EOSPCT 1 10/06/2019   BASOPCT 0 10/06/2019    CMP:  Lab Results  Component Value Date   NA 139 10/06/2019   NA 142 09/29/2019   K 3.5 10/06/2019   CL 107 10/06/2019   CO2 26 10/06/2019   BUN 12 10/06/2019   BUN 9 09/29/2019   CREATININE 0.68 10/06/2019   CREATININE 1.00 (H) 11/12/2016   PROT 7.8 10/04/2019   PROT 6.7 09/29/2019   ALBUMIN 4.4 10/04/2019   ALBUMIN 4.4 09/29/2019   BILITOT 0.9 10/04/2019   BILITOT 0.5 09/29/2019   ALKPHOS 81 10/04/2019   AST 62 (H) 10/04/2019   ALT 32 10/04/2019  .   Total Discharge time is about 33 minutes  Roxan Hockey M.D on  10/07/2019 at 4:06 PM  Go to [email or web address] -  for contact info  [facility] - Office  [phone]

## 2019-10-08 ENCOUNTER — Telehealth: Payer: Self-pay | Admitting: Internal Medicine

## 2019-10-08 ENCOUNTER — Telehealth: Payer: Self-pay | Admitting: *Deleted

## 2019-10-08 NOTE — Telephone Encounter (Signed)
TRANSITIONAL CARE MANAGEMENT TELEPHONE OUTREACH NOTE   Contact Date: 10/08/2019 Contacted By: Eston Mould, LPN   DISCHARGE INFORMATION Date of Discharge:10/07/19 Discharge Facility: [facility] Principal Discharge Diagnosis:Community Acquired Pneumonia  Outpatient Follow Up Recommendations (copied from discharge summary) 1)Please take medications as prescribed 2)-please repeat CBC and BMP Blood Test with your Primary care provider around Monday 10/12/19 3)-You are taking apixaban/Eliquis which is a blood thinner so Avoid ibuprofen/Advil/Aleve/Motrin/Goody Powders/Naproxen/BC powders/Meloxicam/Diclofenac/Indomethacin and other Nonsteroidal antiinflammatory medications as these will make you more likely to bleed and can cause stomach ulcers, can also cause Kidney problems. 4)Diet---- Dysphagia 2 (Fine chop) solids;-----Honey thick liquids;--Free water protocol after oral care Liquid Administration via: Cup Medication Administration: Crushed with puree Supervision: Patient able to self feed Compensations: Slow rate;Small sips/bites;Multiple dry swallows after each bite/sip;Follow solids with liquid Postural Changes: Remain semi-upright after after feeds/meals (Comment);Seated upright at 90 degrees  [name] is a female primary care patient of Stacks, Cletus Gash, MD. An outgoing telephone call was made today and I spoke with [place] her daughter.  Ms. Ranly condition(s) and treatment(s) were discussed. An opportunity to ask questions was provided and all were answered or forwarded as appropriate.    ACTIVITIES OF DAILY LIVING  Ronja B Seivert lives alone but she is staying with Almyra Free her daughter and she cannot perform ADLs independently. her primary caregiver is her daughters. she is able to depend on her primary caregiver(s) for consistent help. Transportation to appointments, to pick up medications, and to run errands is not a problem.  (Consider referral to [place] if  transportation or a consistent caregiver is a problem)   Fall Risk Fall Risk  09/29/2019 05/14/2019  Falls in the past year? 0 0  Number falls in past yr: 0 -  Injury with Fall? 0 -  Risk for fall due to : History of fall(s) -  Follow up Falls evaluation completed -    high [place] Modifications/Assistive Devices Wheelchair: No Cane: No Ramp: No Bedside Toilet: Yes Hospital Bed:  No Other: [place] she is not receiving home health services.     MEDICATION RECONCILIATION  Ms. Smail has been able to pick-up all prescribed discharge medications from the pharmacy.   A post discharge medication reconciliation was performed and the complete medication list was reviewed with the patient/caregiver and is current as of 10/08/2019. Changes highlighted below.  Discontinued Medications n/a  Current Medication List Allergies as of 10/08/2019      Reactions   Tuberculin Tests Swelling   Vit D-vit E-safflower Oil    Paroxetine Hcl Rash      Medication List       Accurate as of October 08, 2019 12:01 PM. If you have any questions, ask your nurse or doctor.        acetaminophen 325 MG tablet Commonly known as: TYLENOL Take 2 tablets (650 mg total) by mouth every 6 (six) hours as needed for mild pain, fever or headache.   amLODipine 2.5 MG tablet Commonly known as: NORVASC Take 1 tablet (2.5 mg total) by mouth daily.   apixaban 5 MG Tabs tablet Commonly known as: Eliquis Take 1 tablet (5 mg total) by mouth 2 (two) times daily.   B COMPLEX-VITAMIN B12 PO Take by mouth.   cefdinir 300 MG capsule Commonly known as: OMNICEF Take 1 capsule (300 mg total) by mouth 2 (two) times daily for 5 days.   clotrimazole-betamethasone cream Commonly known as:  LOTRISONE APPLY TO AFFECTED AREA TWICE A DAY   doxycycline 100 MG tablet Commonly known as: VIBRA-TABS Take 1 tablet (100 mg total) by mouth 2 (two) times daily for 5 days.   escitalopram 10 MG  tablet Commonly known as: LEXAPRO Take 1 tablet (10 mg total) by mouth daily.   famotidine 20 MG tablet Commonly known as: PEPCID Take 1 tablet (20 mg total) by mouth every 12 (twelve) hours.   levothyroxine 50 MCG tablet Commonly known as: SYNTHROID Take 1 tablet (50 mcg total) by mouth daily.   metoprolol succinate 100 MG 24 hr tablet Commonly known as: TOPROL-XL Take with or immediately following a meal.    Mucinex 600 MG 12 hr tablet Generic drug: guaiFENesin Take 1 tablet (600 mg total) by mouth 2 (two) times daily for 10 days.   polyethylene glycol powder 17 GM/SCOOP powder Commonly known as: GLYCOLAX/MIRALAX Take 17 g by mouth 2 (two) times daily. For constipation   triamcinolone cream 0.1 % Commonly known as: KENALOG APPLY TO AFFECTED AREA TWICE A DAY   Vitamin D (Ergocalciferol) 1.25 MG (50000 UNIT) Caps capsule Commonly known as: DRISDOL Take 1 capsule (50,000 Units total) by mouth every 7 (seven) days.        PATIENT EDUCATION & FOLLOW-UP PLAN  An appointment for Transitional Care Management is scheduled with [name], MD on 10/21/19 at 10:10.  Take all medications as prescribed  Contact our office by calling [phone] if you have any questions or concerns

## 2019-10-08 NOTE — Telephone Encounter (Signed)
Left message to call back  

## 2019-10-08 NOTE — Telephone Encounter (Signed)
New message:     Patient daughter calling stating that her mother saw a doctor in [place] he stateted that 50 percent of liquids thatt she drinks is going in her lungs. The daugter would for some one to call her concering this matter

## 2019-10-08 NOTE — Telephone Encounter (Signed)
    Transitional Care Management  Contact Attempt Attempt Date:10/08/2019 Attempted By: Eston Mould, LPN  1st unsuccessful TCM contact attempt.   I reached out to Crystal Browning on her preferred telephone number to discuss Transitional Care Management, medication reconciliation, and to schedule a TCM hospital follow-up with her PCP at [facility].  Discharge Date: 10/07/19 Location: [name] Discharge Dx: Community Acquired Pneumonia  Recommendations for Outpatient Follow-up:   (insert from discharge summary) 1)Please take medications as prescribed 2)-please repeat CBC and BMP Blood Test with your Primary care provider around Monday 10/12/19 3)-You are taking apixaban/Eliquis which is a blood thinner so Avoid ibuprofen/Advil/Aleve/Motrin/Goody Powders/Naproxen/BC powders/Meloxicam/Diclofenac/Indomethacin and other Nonsteroidal antiinflammatory medications as these will make you more likely to bleed and can cause stomach ulcers, can also cause Kidney problems. 4)Diet---- Dysphagia 2 (Fine chop) solids;-----Honey thick liquids;--Free water protocol after oral care Liquid Administration via: Cup Medication Administration: Crushed with puree Supervision: Patient able to self feed Compensations: Slow rate;Small sips/bites;Multiple dry swallows after each bite/sip;Follow solids with liquid Postural Changes: Remain semi-upright after after feeds/meals (Comment);Seated upright at 90 degrees   Plan I left a HIPPA compliant message for her to return my call.  Will attempt to contact again within the 2 business day post discharge window if she does not return my call.

## 2019-10-09 ENCOUNTER — Ambulatory Visit (INDEPENDENT_AMBULATORY_CARE_PROVIDER_SITE_OTHER): Payer: Medicare Other | Admitting: Family Medicine

## 2019-10-09 DIAGNOSIS — R197 Diarrhea, unspecified: Secondary | ICD-10-CM

## 2019-10-09 DIAGNOSIS — R300 Dysuria: Secondary | ICD-10-CM | POA: Diagnosis not present

## 2019-10-09 LAB — CULTURE, BLOOD (ROUTINE X 2)
Culture: NO GROWTH
Culture: NO GROWTH
Special Requests: ADEQUATE

## 2019-10-09 MED ORDER — CLOTRIMAZOLE 10 MG MT TROC: OROMUCOSAL | 2 refills | Status: DC

## 2019-10-09 NOTE — Telephone Encounter (Signed)
Returned call to the [facility] on [place].Marland Kitchen LMTCB  Crystal Browning not listed on the [facility] but is the one that called our office 10/08/19.

## 2019-10-09 NOTE — Progress Notes (Signed)
    Subjective:    Patient ID: Crystal Browning, female    DOB: [birth year], 86 y.o.   MRN: [id number]   HPI: Crystal Browning is a 86 y.o. female presenting for diarrhea earlier today. Last BM was mid morning.  Drinking plenty of water. Eating ice cream and has better energy. She is walking on a walker now.    Depression screen [facility] 2/9 09/29/2019 05/14/2019 10/17/2018 09/15/2018 06/11/2018  Decreased Interest 0 1 2 0 2  Down, Depressed, Hopeless 0 1 2 0 0  PHQ - 2 Score 0 2 4 0 2  Altered sleeping - 0 1 - 0  Tired, decreased energy - 1 2 - 0  Change in appetite - 1 1 - 1  Feeling bad or failure about yourself  - 2 0 - 0  Trouble concentrating - 0 1 - 3  Moving slowly or fidgety/restless - 0 1 - 0  Suicidal thoughts - 0 0 - 0  PHQ-9 Score - 6 10 - 6  Difficult doing work/chores - Somewhat difficult - - -     Relevant past medical, surgical, family and social history reviewed and updated as indicated.  Interim medical history since our last visit reviewed. Allergies and medications reviewed and updated.  ROS:  Review of Systems   Social History   Tobacco Use  Smoking Status Never Smoker  Smokeless Tobacco Never Used       Objective:     Wt Readings from Last 3 Encounters:  10/04/19 131 lb 2.8 oz (59.5 kg)  09/29/19 126 lb 3.2 oz (57.2 kg)  07/01/19 127 lb 9.6 oz (57.9 kg)     Exam deferred. Pt. Harboring due to COVID 19. Phone visit performed.   Assessment & Plan:   1. Diarrhea of presumed infectious origin     Meds ordered this encounter  Medications  . clotrimazole (MYCELEX) 10 MG troche    Sig: Allow one to dissolve in the mouth 5 times daily For yeast    Dispense:  35 tablet    Refill:  2    No orders of the defined types were placed in this encounter.     Diagnoses and all orders for this visit:  Diarrhea of presumed infectious origin  Other orders -     clotrimazole (MYCELEX) 10 MG troche; Allow one to dissolve in the mouth 5 times daily For  yeast    Virtual Visit via telephone Note  I discussed the limitations, risks, security and privacy concerns of performing an evaluation and management service by telephone and the availability of in person appointments. The patient was identified with two identifiers. Pt.expressed understanding and agreed to proceed. Pt. Is at home. Dr. Livia Snellen is in his office.  Follow Up Instructions:   I discussed the assessment and treatment plan with the patient. The patient was provided an opportunity to ask questions and all were answered. The patient agreed with the plan and demonstrated an understanding of the instructions.   The patient was advised to call back or seek an in-person evaluation if the symptoms worsen or if the condition fails to improve as anticipated.   Total minutes including chart review and phone contact time: 13   Follow up plan: No follow-ups on file.  Crystal Fraise, MD [place]

## 2019-10-11 ENCOUNTER — Encounter: Payer: Self-pay | Admitting: Family Medicine

## 2019-10-12 NOTE — Telephone Encounter (Signed)
[id number] NUMBER

## 2019-10-13 ENCOUNTER — Ambulatory Visit (INDEPENDENT_AMBULATORY_CARE_PROVIDER_SITE_OTHER): Payer: Medicare Other | Admitting: Family

## 2019-10-13 ENCOUNTER — Other Ambulatory Visit: Payer: Self-pay | Admitting: Nurse Practitioner

## 2019-10-13 ENCOUNTER — Encounter: Payer: Self-pay | Admitting: Family

## 2019-10-13 DIAGNOSIS — B3731 Acute candidiasis of vulva and vagina: Secondary | ICD-10-CM

## 2019-10-13 DIAGNOSIS — R300 Dysuria: Secondary | ICD-10-CM | POA: Diagnosis not present

## 2019-10-13 DIAGNOSIS — R197 Diarrhea, unspecified: Secondary | ICD-10-CM | POA: Diagnosis not present

## 2019-10-13 DIAGNOSIS — B373 Candidiasis of vulva and vagina: Secondary | ICD-10-CM | POA: Diagnosis not present

## 2019-10-13 DIAGNOSIS — R399 Unspecified symptoms and signs involving the genitourinary system: Secondary | ICD-10-CM | POA: Diagnosis not present

## 2019-10-13 LAB — URINALYSIS, COMPLETE
Bilirubin, UA: NEGATIVE
Glucose, UA: NEGATIVE
Ketones, UA: NEGATIVE
Nitrite, UA: NEGATIVE
Protein,UA: NEGATIVE
Specific Gravity, UA: 1.025 (ref 1.005–1.030)
Urobilinogen, Ur: 0.2 mg/dL (ref 0.2–1.0)
pH, UA: 6 (ref 5.0–7.5)

## 2019-10-13 LAB — MICROSCOPIC EXAMINATION
Bacteria, UA: NONE SEEN
Renal Epithel, UA: NONE SEEN /hpf

## 2019-10-13 LAB — WET PREP FOR TRICH, YEAST, CLUE
Clue Cell Exam: NEGATIVE
Trichomonas Exam: NEGATIVE
Yeast Exam: POSITIVE — AB

## 2019-10-13 MED ORDER — CLOTRIMAZOLE 1 % VA CREA: 1.0000 | TOPICAL_CREAM | Freq: Every day | VAGINAL | 0 refills | Status: DC

## 2019-10-13 MED ORDER — AMOXICILLIN 875 MG PO TABS: 875.0000 mg | ORAL_TABLET | Freq: Two times a day (BID) | ORAL | 0 refills | Status: DC

## 2019-10-13 MED ORDER — FLUCONAZOLE 150 MG PO TABS: 150.0000 mg | ORAL_TABLET | Freq: Once | ORAL | 0 refills | Status: DC

## 2019-10-13 MED ORDER — FLUCONAZOLE 150 MG PO TABS: 150.0000 mg | ORAL_TABLET | ORAL | 0 refills | Status: DC | PRN

## 2019-10-13 NOTE — Addendum Note (Signed)
Addended by: Rolena Infante on: 10/13/2019 11:10 AM   Modules accepted: Orders

## 2019-10-13 NOTE — Progress Notes (Signed)
   Virtual Visit via telephone Note Due to COVID-19 pandemic this visit was conducted virtually. This visit type was conducted due to national recommendations for restrictions regarding the COVID-19 Pandemic (e.g. social distancing, sheltering in place) in an effort to limit this patient's exposure and mitigate transmission in our community. All issues noted in this document were discussed and addressed.  A physical exam was not performed with this format.  I connected with Crystal Browning on 10/13/19 at 11:34 AM by telephone and verified that I am speaking with the correct person using two identifiers. Crystal Browning is currently located at home and daughter is currently with her during visit. The provider, Evelina Dun, FNP is located in their office at time of visit.  I discussed the limitations, risks, security and privacy concerns of performing an evaluation and management service by telephone and the availability of in person appointments. I also discussed with the patient that there may be a patient responsible charge related to this service. The patient expressed understanding and agreed to proceed.   History and Present Illness:  Dysuria  This is a new problem. The current episode started in the past 7 days (2-3 days). The problem occurs every urination. The problem has been gradually worsening. The quality of the pain is described as burning. Associated symptoms include frequency and nausea. Pertinent negatives include no flank pain. She has tried increased fluids for the symptoms.  Vaginal Itching The patient's primary symptoms include genital itching. Associated symptoms include dysuria, frequency and nausea. Pertinent negatives include no flank pain.      Review of Systems  Gastrointestinal: Positive for nausea.  Genitourinary: Positive for dysuria and frequency. Negative for flank pain.     Observations/Objective: NO SOB or distress noted, weakness noted   Assessment and  Plan: 1. UTI symptoms Force fluids RTO if symptoms worsen or do not improve Culture pending - amoxicillin (AMOXIL) 875 MG tablet; Take 1 tablet (875 mg total) by mouth 2 (two) times daily.  Dispense: 14 tablet; Refill: 0  2. Vagina, candidiasis Do not scratch Keep clean and dry - fluconazole (DIFLUCAN) 150 MG tablet; Take 1 tablet (150 mg total) by mouth every three (3) days as needed.  Dispense: 3 tablet; Refill: 0 - clotrimazole (GYNE-LOTRIMIN) 1 % vaginal cream; Place 1 Applicatorful vaginally at bedtime.  Dispense: 45 g; Refill: 0      I discussed the assessment and treatment plan with the patient. The patient was provided an opportunity to ask questions and all were answered. The patient agreed with the plan and demonstrated an understanding of the instructions.   The patient was advised to call back or seek an in-person evaluation if the symptoms worsen or if the condition fails to improve as anticipated.  The above assessment and management plan was discussed with the patient. The patient verbalized understanding of and has agreed to the management plan. Patient is aware to call the clinic if symptoms persist or worsen. Patient is aware when to return to the clinic for a follow-up visit. Patient educated on when it is appropriate to go to the emergency department.   Time call ended:  11:50 AM   I provided 16 minutes of non-face-to-face time during this encounter.    Evelina Dun, FNP

## 2019-10-14 LAB — URINE CULTURE

## 2019-10-14 NOTE — Telephone Encounter (Signed)
Attempted to contact patient again regarding the call from daughter- 4th attempt to contact, will remove from triage.

## 2019-10-19 ENCOUNTER — Ambulatory Visit (INDEPENDENT_AMBULATORY_CARE_PROVIDER_SITE_OTHER): Payer: Medicare Other | Admitting: Family Medicine

## 2019-10-19 ENCOUNTER — Encounter: Payer: Self-pay | Admitting: Family Medicine

## 2019-10-19 DIAGNOSIS — N760 Acute vaginitis: Secondary | ICD-10-CM

## 2019-10-19 MED ORDER — FLUCONAZOLE 100 MG PO TABS: ORAL_TABLET | ORAL | 0 refills | Status: DC

## 2019-10-19 NOTE — Progress Notes (Signed)
    Subjective:    Patient ID: ADARA HAUTH, female    DOB: Jan 05, 1934, 87 y.o.   MRN: [id number]   HPI: ALYSA CHRYSLER is a 86 y.o. female presenting for scratching herself until the skin comes off. At perineum. Ongoing for several weeks.  Some improvement with previous treatment, but it comes right back. Chart shows use of topicals and short courses ofr fluconazole.   Depression screen [facility] 2/9 09/29/2019 05/14/2019 10/17/2018 09/15/2018 06/11/2018  Decreased Interest 0 1 2 0 2  Down, Depressed, Hopeless 0 1 2 0 0  PHQ - 2 Score 0 2 4 0 2  Altered sleeping - 0 1 - 0  Tired, decreased energy - 1 2 - 0  Change in appetite - 1 1 - 1  Feeling bad or failure about yourself  - 2 0 - 0  Trouble concentrating - 0 1 - 3  Moving slowly or fidgety/restless - 0 1 - 0  Suicidal thoughts - 0 0 - 0  PHQ-9 Score - 6 10 - 6  Difficult doing work/chores - Somewhat difficult - - -     Relevant past medical, surgical, family and social history reviewed and updated as indicated.  Interim medical history since our last visit reviewed. Allergies and medications reviewed and updated.  ROS:  Review of Systems  Constitutional: Negative.   HENT: Negative.   Eyes: Negative for visual disturbance.  Respiratory: Negative for shortness of breath.   Cardiovascular: Negative for chest pain.  Gastrointestinal: Negative for abdominal pain.  Musculoskeletal: Negative for arthralgias.     Social History   Tobacco Use  Smoking Status Never Smoker  Smokeless Tobacco Never Used       Objective:     Wt Readings from Last 3 Encounters:  10/04/19 131 lb 2.8 oz (59.5 kg)  09/29/19 126 lb 3.2 oz (57.2 kg)  07/01/19 127 lb 9.6 oz (57.9 kg)     Exam deferred. Pt. Harboring due to COVID 19. Phone visit performed.   Assessment & Plan:  No diagnosis found.  No orders of the defined types were placed in this encounter.   No orders of the defined types were placed in this encounter.     There are no  diagnoses linked to this encounter.  Virtual Visit via telephone Note  I discussed the limitations, risks, security and privacy concerns of performing an evaluation and management service by telephone and the availability of in person appointments. The patient was identified with two identifiers. Pt.expressed understanding and agreed to proceed. Pt. Is at home. Dr. Livia Snellen is in his office.  Follow Up Instructions:   I discussed the assessment and treatment plan with the patient. The patient was provided an opportunity to ask questions and all were answered. The patient agreed with the plan and demonstrated an understanding of the instructions.   The patient was advised to call back or seek an in-person evaluation if the symptoms worsen or if the condition fails to improve as anticipated.   Total minutes including chart review and phone contact time: 16   Follow up plan: No follow-ups on file.  [name], MD [place]

## 2019-10-21 ENCOUNTER — Ambulatory Visit: Payer: Medicare Other | Admitting: Family Medicine

## 2019-10-28 ENCOUNTER — Telehealth: Payer: Self-pay | Admitting: Family Medicine

## 2019-10-28 NOTE — Chronic Care Management (AMB) (Signed)
  Chronic Care Management   Note  10/28/2019 Name: Crystal Browning MRN: [id number] DOB: [birth year]  [name] is a 86 y.o. year old female who is a primary care patient of Stacks, Cletus Gash, MD. I reached out to [name] by phone today in response to a referral sent by [name]'s health plan.     [name] was given information about Chronic Care Management services today including:  1. CCM service includes personalized support from designated clinical staff supervised by her physician, including individualized plan of care and coordination with other care providers 2. 24/7 contact phone numbers for assistance for urgent and routine care needs. 3. Service will only be billed when office clinical staff spend 20 minutes or more in a month to coordinate care. 4. Only one practitioner may furnish and bill the service in a calendar month. 5. The patient may stop CCM services at any time (effective at the end of the month) by phone call to the office staff. 6. The patient will be responsible for cost sharing (co-pay) of up to 20% of the service fee (after annual deductible is met).  Patient did not agree to enrollment in care management services and does not wish to consider at this time.  Follow up plan: The patient has been provided with contact information for the care management team and has been advised to call with any health related questions or concerns.   Noreene Larsson, [place], [place], [place] [address] Direct Dial: [phone] [email or web address] Website: [email or web address]

## 2019-11-09 ENCOUNTER — Encounter (HOSPITAL_COMMUNITY): Payer: Medicare Other | Admitting: Dentistry

## 2019-11-23 ENCOUNTER — Encounter (HOSPITAL_COMMUNITY): Payer: Self-pay | Admitting: Dentistry

## 2019-11-23 ENCOUNTER — Other Ambulatory Visit: Payer: Self-pay

## 2019-11-23 ENCOUNTER — Ambulatory Visit (HOSPITAL_COMMUNITY): Payer: Medicare Other | Admitting: Dentistry

## 2019-11-23 VITALS — BP 154/63 | HR 58 | Temp 99.1°F

## 2019-11-23 DIAGNOSIS — Z463 Encounter for fitting and adjustment of dental prosthetic device: Secondary | ICD-10-CM

## 2019-11-23 DIAGNOSIS — C321 Malignant neoplasm of supraglottis: Secondary | ICD-10-CM

## 2019-11-23 DIAGNOSIS — Z972 Presence of dental prosthetic device (complete) (partial): Secondary | ICD-10-CM

## 2019-11-23 DIAGNOSIS — M264 Malocclusion, unspecified: Secondary | ICD-10-CM

## 2019-11-23 DIAGNOSIS — K117 Disturbances of salivary secretion: Secondary | ICD-10-CM

## 2019-11-23 DIAGNOSIS — K0820 Unspecified atrophy of edentulous alveolar ridge: Secondary | ICD-10-CM

## 2019-11-23 DIAGNOSIS — K08109 Complete loss of teeth, unspecified cause, unspecified class: Secondary | ICD-10-CM

## 2019-11-23 DIAGNOSIS — Z923 Personal history of irradiation: Secondary | ICD-10-CM

## 2019-11-23 NOTE — Patient Instructions (Signed)
PLAN: 1.  I discussed the risks, benefits, complications of proceeding with fabrication of a new upper and lower complete denture by a general dentist in [place], [place] or a Public librarian in [place], [place].  Patient is a difficult case to fabricate dentures for and ideally would follow-up with a prosthodontist for fabrication of new upper and lower dentures.  We also discussed implant therapy with the prosthodontist as well. Patient and daughter currently do not wish to proceed with referral to a prosthodontist or general dentist in [place], [place] for fabrication of new dentures.   2. I then discussed my imminent retirement in August 2021.  Daughter indicates that they will follow-up with a local dentist as needed for evaluation of dentures or sore spots.  The patient, however(by daughter's report), does not wear dentures very much at all.   3. Patient or daughter to call new primary dentist if sore spots arise.  Patient use salt water rinses as needed.  To call is sore spots arise. Patient to use salt water rinses as needed to aid healing.    Ruby Cola

## 2019-11-23 NOTE — Progress Notes (Signed)
11/23/19  Patient:            Crystal Browning Date of Birth:  Feb 07, 1934 MRN:                [id number]  COVID 19 SCREENING: The patient does not symptoms concerning for COVID-19 infection (Including fever, chills, cough, or new SHORTNESS OF BREATH).   BP (!) 154/63 (BP Location: Right Arm)   Pulse (!) 58   Temp 99.1 F (37.3 C)    Crystal Browning presents for periodic oral exam and evaluation of upper and lower complete dentures. Patient with history of atrial fibrillation and cardioversion by Dr. Debara Pickett.Patient last saw Dr. Debara Pickett on 07/01/2019 with plan to maintain patient on Eliquis therapy with no current plan for future cardioversion attempts. Patient also saw Dr. 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  Patient Active Problem List   Diagnosis Date Noted  . CAP (community acquired pneumonia) 10/04/2019  . Pharyngoesophageal dysphagia 01/14/2018  . Long term (current) use of anticoagulants 12/14/2016  . Atrial fibrillation ([place]) 10/30/2016  . Other fatigue 10/30/2016  . Venous insufficiency of both lower extremities 10/30/2016  . History of external beam radiation therapy 08/20/2016  . History of laryngeal cancer 08/20/2016  . Xerostomia 08/20/2016  . Postmenopausal 07/17/2015  . Thyroid activity decreased 07/17/2015  . Essential hypertension 07/12/2015  . Angina pectoris ([place]) 07/12/2015  . Vitamin D deficiency 07/12/2015  . Other and combined forms of senile cataract 09/03/2013    Medical History.  Past Medical History:  Diagnosis Date  . Bilateral lower extremity edema 10/30/2016  . Cancer ([place]) 03/28/11   SCCA of the Supraglottic Larynx ([place])    ; S/P Chemoradiation therapy from 05/09/05 thru 06/29/05  . Depression    History of Depression- Lexapro therapy  . Diverticulosis 02/2005   History of diverticulosis with admission from 8/18 - 03/04/2005  . Esophageal stricture 03/03/2018  . Generalized  abdominal pain 07/17/2015  . History of chemotherapy    S/P chemotherapy with Dr. Sonny Dandy -2006  . Hypertension   . Hypothyroid   . Osteoporosis    History of Osteoporosis with one year of Actonel only  . Radiation    S/P radiation thrapy -06/2005  . TIA (transient ischemic attack)    History of TIA's with no residual effect    Allergies  Allergen Reactions  . Tuberculin Tests Swelling  . Vit D-Vit E-Safflower Oil   . Paroxetine Hcl Rash   Current Outpatient Medications  Medication Sig Dispense Refill  . acetaminophen (TYLENOL) 325 MG tablet Take 2 tablets (650 mg total) by mouth every 6 (six) hours as needed for mild pain, fever or headache. 12 tablet 0  . apixaban (ELIQUIS) 5 MG TABS tablet Take 1 tablet (5 mg total) by mouth 2 (two) times daily. 180 tablet 1  . B Complex-Folic Acid (B COMPLEX-VITAMIN B12 PO) Take by mouth.    . escitalopram (LEXAPRO) 10 MG tablet Take 1 tablet (10 mg total) by mouth daily. 90 tablet 1  . famotidine (PEPCID) 20 MG tablet Take 1 tablet (20 mg total) by mouth every 12 (twelve) hours. 180 tablet 1  . levothyroxine (SYNTHROID) 50 MCG tablet Take 1 tablet (50 mcg total) by mouth daily. 90 tablet 1  . metoprolol succinate (TOPROL-XL) 100 MG 24 hr tablet Take with or immediately following a meal. 90 tablet 1  . polyethylene glycol powder (GLYCOLAX/MIRALAX) 17 GM/SCOOP powder Take 17 g by  mouth 2 (two) times daily. For constipation 850 g 3  . amLODipine (NORVASC) 2.5 MG tablet Take 1 tablet (2.5 mg total) by mouth daily. (Patient not taking: Reported on 11/23/2019) 90 tablet 1  . clotrimazole (GYNE-LOTRIMIN) 1 % vaginal cream Place 1 Applicatorful vaginally at bedtime. (Patient not taking: Reported on 11/23/2019) 45 g 0  . clotrimazole-betamethasone (LOTRISONE) cream APPLY TO AFFECTED AREA TWICE A DAY (Patient not taking: Reported on 11/23/2019) 45 g 2  . triamcinolone cream (KENALOG) 0.1 % APPLY TO AFFECTED AREA TWICE A DAY (Patient not taking: Reported on  11/23/2019) 80 g 1  . Vitamin D, Ergocalciferol, (DRISDOL) 1.25 MG (50000 UNIT) CAPS capsule Take 1 capsule (50,000 Units total) by mouth every 7 (seven) days. (Patient not taking: Reported on 11/23/2019) 13 capsule 3   Current Facility-Administered Medications  Medication Dose Route Frequency Provider Last Rate Last Admin  . 0.9 %  sodium chloride infusion  500 mL Intravenous Once Irene Shipper, MD       CC: Patient without complaints from dentures.   Exam: General: Patient is a well-developed, slightly built female in no acute distress. Head and neck: No lymphadenopathy. No acute TMJ symptoms. Intraoral exam: Edentulous . Moderate.  No denture irritation or erythema noted. 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 Prosthodontics: Upper and lower dentures are stable with less than ideal retention consistent with atrophy of alveolar ridges. Patient had upper and lower complete denture relines inserted on 10/21/2012. Initial dentures were fabricated and inserted in 2007. Procedure: Pressure indicting paste applied to dentures and adjustments made as needed. [place] Occlusion adjusted as needed. [place] Right posterior crossbite as before. Patient with tendency to protrude lower jaw with evaluation of occlusion. Patient was again instructed on finding maximum intercuspation position. Less than ideal occlusion, but patient accepts results and is able to chew food without problems.  PLAN: 1.  I discussed the risks, benefits, complications of proceeding with fabrication of a new upper and lower complete denture by a general dentist in [place], [place] or a Public librarian in [place], [place].  Patient is a difficult case to fabricate dentures for and ideally would follow-up with a prosthodontist for fabrication of new upper and lower dentures.  We also discussed implant therapy with the prosthodontist as well. Patient and daughter currently do not wish to proceed with referral to  a prosthodontist or general dentist in [place], [place] for fabrication of new dentures.   2. I then discussed my imminent retirement in August 2021.  Daughter indicates that they will follow-up with a local dentist as needed for evaluation of dentures or sore spots.  The patient, however(by daughter's report), does not wear dentures very much at all.   3. Patient or daughter to call new primary dentist if sore spots arise.  Patient use salt water rinses as needed.  To call is sore spots arise. Patient to use salt water rinses as needed to aid healing.    Ruby Cola

## 2019-12-11 ENCOUNTER — Ambulatory Visit: Payer: Medicare Other | Admitting: Nurse Practitioner

## 2019-12-21 ENCOUNTER — Encounter: Payer: Self-pay | Admitting: *Deleted

## 2019-12-29 ENCOUNTER — Other Ambulatory Visit: Payer: Self-pay | Admitting: Family Medicine

## 2019-12-29 DIAGNOSIS — E039 Hypothyroidism, unspecified: Secondary | ICD-10-CM

## 2019-12-30 ENCOUNTER — Other Ambulatory Visit: Payer: Self-pay | Admitting: Family Medicine

## 2019-12-30 DIAGNOSIS — E039 Hypothyroidism, unspecified: Secondary | ICD-10-CM

## 2020-01-01 ENCOUNTER — Other Ambulatory Visit: Payer: Self-pay | Admitting: Family Medicine

## 2020-01-01 DIAGNOSIS — E039 Hypothyroidism, unspecified: Secondary | ICD-10-CM

## 2020-03-02 DIAGNOSIS — R300 Dysuria: Secondary | ICD-10-CM | POA: Diagnosis not present

## 2020-03-02 DIAGNOSIS — N393 Stress incontinence (female) (male): Secondary | ICD-10-CM | POA: Diagnosis not present

## 2020-03-02 DIAGNOSIS — R410 Disorientation, unspecified: Secondary | ICD-10-CM | POA: Diagnosis not present

## 2020-03-02 DIAGNOSIS — R8290 Unspecified abnormal findings in urine: Secondary | ICD-10-CM | POA: Diagnosis not present

## 2020-03-02 DIAGNOSIS — R350 Frequency of micturition: Secondary | ICD-10-CM | POA: Diagnosis not present

## 2020-03-02 DIAGNOSIS — N3001 Acute cystitis with hematuria: Secondary | ICD-10-CM | POA: Diagnosis not present

## 2020-03-23 ENCOUNTER — Other Ambulatory Visit: Payer: Self-pay | Admitting: Family Medicine

## 2020-03-23 DIAGNOSIS — F419 Anxiety disorder, unspecified: Secondary | ICD-10-CM

## 2020-03-23 DIAGNOSIS — I4891 Unspecified atrial fibrillation: Secondary | ICD-10-CM

## 2020-03-26 ENCOUNTER — Other Ambulatory Visit: Payer: Self-pay | Admitting: Family Medicine

## 2020-03-26 DIAGNOSIS — E039 Hypothyroidism, unspecified: Secondary | ICD-10-CM

## 2020-03-30 ENCOUNTER — Other Ambulatory Visit: Payer: Self-pay

## 2020-03-30 ENCOUNTER — Ambulatory Visit (INDEPENDENT_AMBULATORY_CARE_PROVIDER_SITE_OTHER): Payer: Medicare Other | Admitting: Family Medicine

## 2020-03-30 ENCOUNTER — Encounter: Payer: Self-pay | Admitting: Family Medicine

## 2020-03-30 VITALS — BP 175/70 | HR 78 | Temp 98.3°F | Resp 20 | Ht 61.0 in | Wt 127.0 lb

## 2020-03-30 DIAGNOSIS — R1314 Dysphagia, pharyngoesophageal phase: Secondary | ICD-10-CM

## 2020-03-30 DIAGNOSIS — E039 Hypothyroidism, unspecified: Secondary | ICD-10-CM | POA: Diagnosis not present

## 2020-03-30 DIAGNOSIS — E782 Mixed hyperlipidemia: Secondary | ICD-10-CM | POA: Diagnosis not present

## 2020-03-30 DIAGNOSIS — R350 Frequency of micturition: Secondary | ICD-10-CM | POA: Diagnosis not present

## 2020-03-30 DIAGNOSIS — I10 Essential (primary) hypertension: Secondary | ICD-10-CM | POA: Diagnosis not present

## 2020-03-30 DIAGNOSIS — F419 Anxiety disorder, unspecified: Secondary | ICD-10-CM | POA: Diagnosis not present

## 2020-03-30 DIAGNOSIS — I4891 Unspecified atrial fibrillation: Secondary | ICD-10-CM

## 2020-03-30 DIAGNOSIS — E860 Dehydration: Secondary | ICD-10-CM | POA: Diagnosis not present

## 2020-03-30 DIAGNOSIS — R8281 Pyuria: Secondary | ICD-10-CM | POA: Diagnosis not present

## 2020-03-30 DIAGNOSIS — R443 Hallucinations, unspecified: Secondary | ICD-10-CM | POA: Diagnosis not present

## 2020-03-30 DIAGNOSIS — E559 Vitamin D deficiency, unspecified: Secondary | ICD-10-CM

## 2020-03-30 DIAGNOSIS — R5381 Other malaise: Secondary | ICD-10-CM | POA: Diagnosis not present

## 2020-03-30 LAB — MICROSCOPIC EXAMINATION
Bacteria, UA: NONE SEEN
Epithelial Cells (non renal): NONE SEEN /hpf (ref 0–10)

## 2020-03-30 LAB — URINALYSIS, COMPLETE
Bilirubin, UA: NEGATIVE
Glucose, UA: NEGATIVE
Ketones, UA: NEGATIVE
Nitrite, UA: NEGATIVE
Protein,UA: NEGATIVE
Specific Gravity, UA: 1.02 (ref 1.005–1.030)
Urobilinogen, Ur: 0.2 mg/dL (ref 0.2–1.0)
pH, UA: 5.5 (ref 5.0–7.5)

## 2020-03-30 MED ORDER — AMLODIPINE BESYLATE 2.5 MG PO TABS: 2.5000 mg | ORAL_TABLET | Freq: Every day | ORAL | 1 refills | Status: DC

## 2020-03-30 MED ORDER — AMOXICILLIN 500 MG PO CAPS: 500.0000 mg | ORAL_CAPSULE | Freq: Three times a day (TID) | ORAL | 0 refills | Status: DC

## 2020-03-30 MED ORDER — ESCITALOPRAM OXALATE 10 MG PO TABS: 10.0000 mg | ORAL_TABLET | Freq: Every day | ORAL | 1 refills | Status: DC

## 2020-03-30 MED ORDER — FAMOTIDINE 20 MG PO TABS: 20.0000 mg | ORAL_TABLET | Freq: Two times a day (BID) | ORAL | 1 refills | Status: DC

## 2020-03-30 MED ORDER — APIXABAN 5 MG PO TABS: 5.0000 mg | ORAL_TABLET | Freq: Two times a day (BID) | ORAL | 0 refills | Status: DC

## 2020-03-30 MED ORDER — METOPROLOL SUCCINATE ER 100 MG PO TB24: ORAL_TABLET | ORAL | 1 refills | Status: DC

## 2020-03-30 NOTE — Progress Notes (Signed)
Subjective:  Patient ID: Crystal [place], female    DOB: 08/21/1933  Age: 86 y.o. MRN: [id number]  CC: Medical Management of Chronic Issues   HPI Crystal Browning presents for  follow-up of hypertension. Patient has no history of headache chest pain or shortness of breath or recent cough. Patient also denies symptoms of TIA such as focal numbness or weakness. Patient denies side effects from medication. States taking it regularly.   follow-up on  thyroid. The patient has a history of hypothyroidism for many years. It has been stable recently. Pt. denies any change in  voice, loss of hair, heat or cold intolerance. Energy level has been adequate to good. Patient denies constipation and diarrhea. No myxedema. Medication is as noted below. Verified that pt is taking it daily on an empty stomach. Well tolerated.I attempted to increase her dose back in the spring, but her daughter said that made her mental status worse.  Atrial fibrillation follow up. Pt. is treated with rate control and anticoagulation. Pt.  denies palpitations, rapid rate, chest pain, dyspnea and edema. There has been no bleeding from nose or gums. Pt. has not noticed blood with urine or stool.  Although there is routine bruising easily, it is not excessive.  History Crystal [place]ning has a past medical history of Bilateral lower extremity edema (10/30/2016), Cancer ([place]) (03/28/11), Depression, Diverticulosis (02/2005), Esophageal stricture (03/03/2018), Generalized abdominal pain (07/17/2015), History of chemotherapy, Hypertension, Hypothyroid, Osteoporosis, Radiation, and TIA (transient ischemic attack).   She has a past surgical history that includes Direct laryngoscopy (03/27/2005); Tubal ligation; Total abdominal hysterectomy w/ bilateral salpingoophorectomy (1986); Cataract extraction, bilateral; and Cardioversion (N/A, 11/23/2016).   Her family history includes Alcohol abuse in her brother; COPD in her brother; Cancer (age of onset: 64) in her  father; Heart disease (age of onset: 52) in her mother; Stroke in her mother.She reports that she has never smoked. She has never used smokeless tobacco. She reports that she does not drink alcohol and does not use drugs.  Current Outpatient Medications on File Prior to Visit  Medication Sig Dispense Refill   acetaminophen (TYLENOL) 325 MG tablet Take 2 tablets (650 mg total) by mouth every 6 (six) hours as needed for mild pain, fever or headache. 12 tablet 0   B Complex-Folic Acid (B COMPLEX-VITAMIN B12 PO) Take by mouth.     polyethylene glycol powder (GLYCOLAX/MIRALAX) 17 GM/SCOOP powder Take 17 g by mouth 2 (two) times daily. For constipation 850 g 3   Vitamin D, Ergocalciferol, (DRISDOL) 1.25 MG (50000 UNIT) CAPS capsule Take 1 capsule (50,000 Units total) by mouth every 7 (seven) days. (Patient not taking: Reported on 11/23/2019) 13 capsule 3   Current Facility-Administered Medications on File Prior to Visit  Medication Dose Route Frequency Provider Last Rate Last Admin   0.9 %  sodium chloride infusion  500 mL Intravenous Once Irene Shipper, MD        ROS Review of Systems  Constitutional: Negative.   HENT: Negative.   Eyes: Negative for visual disturbance.  Respiratory: Negative for shortness of breath.   Cardiovascular: Negative for chest pain.  Gastrointestinal: Negative for abdominal pain.  Musculoskeletal: Negative for arthralgias.    Objective:  BP (!) 175/70    Pulse 78    Temp 98.3 F (36.8 C) (Temporal)    Resp 20    Ht _0  (1.549 m)    Wt 127 lb (57.6 kg)    SpO2 98%    BMI 24.00 kg/m  BP Readings from Last 3 Encounters:  03/30/20 (!) 175/70  11/23/19 (!) 154/63  10/07/19 (!) 111/47    Wt Readings from Last 3 Encounters:  03/30/20 127 lb (57.6 kg)  10/04/19 131 lb 2.8 oz (59.5 kg)  09/29/19 126 lb 3.2 oz (57.2 kg)     Physical Exam Constitutional:      General: She is not in acute distress.    Appearance: She is well-developed.  HENT:     Head:  Normocephalic and atraumatic.  Eyes:     Conjunctiva/sclera: Conjunctivae normal.     Pupils: Pupils are equal, round, and reactive to light.  Neck:     Thyroid: No thyromegaly.  Cardiovascular:     Rate and Rhythm: Normal rate and regular rhythm.     Heart sounds: Normal heart sounds. No murmur heard.   Pulmonary:     Effort: Pulmonary effort is normal. No respiratory distress.     Breath sounds: Normal breath sounds. No wheezing or rales.  Abdominal:     General: Bowel sounds are normal. There is no distension.     Palpations: Abdomen is soft.     Tenderness: There is no abdominal tenderness.  Musculoskeletal:        General: Normal range of motion.     Cervical back: Normal range of motion and neck supple.  Lymphadenopathy:     Cervical: No cervical adenopathy.  Skin:    General: Skin is warm and dry.  Neurological:     Mental Status: She is alert and oriented to person, place, and time.  Psychiatric:        Attention and Perception: She is inattentive.        Mood and Affect: Affect is blunt.        Speech: Speech is delayed.        Behavior: Behavior normal.       Assessment & Plan:   Crystal [place]ning was seen today for medical management of chronic issues.  Diagnoses and all orders for this visit:  Atrial fibrillation, unspecified type ([place]) -     apixaban (ELIQUIS) 5 MG TABS tablet; Take 1 tablet (5 mg total) by mouth 2 (two) times daily. -     metoprolol succinate (TOPROL-XL) 100 MG 24 hr tablet; Take with or immediately following a meal. -     CBC with Differential/Platelet -     CMP14+EGFR  Urinary frequency -     Urinalysis, Complete -     Urine Culture -     Microscopic Examination -     CBC with Differential/Platelet -     CMP14+EGFR  Hypothyroidism, unspecified type -     TSH + free T4 -     CBC with Differential/Platelet -     CMP14+EGFR -     levothyroxine (SYNTHROID) 25 MCG tablet; Take 1 tablet (25 mcg total) by mouth daily.  Hallucination -      TSH + free T4 -     CBC with Differential/Platelet -     CMP14+EGFR  Pyuria -     Urinalysis, Complete -     Urine Culture -     Microscopic Examination -     CBC with Differential/Platelet -     CMP14+EGFR  Essential hypertension -     metoprolol succinate (TOPROL-XL) 100 MG 24 hr tablet; Take with or immediately following a meal. -     amLODipine (NORVASC) 2.5 MG tablet; Take 1 tablet (2.5 mg total) by mouth daily. -  CBC with Differential/Platelet -     CMP14+EGFR  Anxiety -     escitalopram (LEXAPRO) 10 MG tablet; Take 1 tablet (10 mg total) by mouth daily. -     CBC with Differential/Platelet -     CMP14+EGFR  Pharyngoesophageal dysphagia -     famotidine (PEPCID) 20 MG tablet; Take 1 tablet (20 mg total) by mouth every 12 (twelve) hours. -     CBC with Differential/Platelet -     CMP14+EGFR  Mixed hyperlipidemia -     Lipid panel  Vitamin D deficiency -     VITAMIN D 25 Hydroxy (Vit-D Deficiency, Fractures)  Other orders -     amoxicillin (AMOXIL) 500 MG capsule; Take 1 capsule (500 mg total) by mouth 3 (three) times daily.   Allergies as of 03/30/2020      Reactions   Tuberculin Tests Swelling   Vit D-vit E-safflower Oil    Paroxetine Hcl Rash      Medication List       Accurate as of March 30, 2020 11:59 PM. If you have any questions, ask your nurse or doctor.        STOP taking these medications   clotrimazole 1 % vaginal cream Commonly known as: GYNE-LOTRIMIN Stopped by: [name], MD   clotrimazole-betamethasone cream Commonly known as: LOTRISONE Stopped by: [name], MD   triamcinolone cream 0.1 % Commonly known as: KENALOG Stopped by: [name], MD     TAKE these medications   acetaminophen 325 MG tablet Commonly known as: TYLENOL Take 2 tablets (650 mg total) by mouth every 6 (six) hours as needed for mild pain, fever or headache.   amLODipine 2.5 MG tablet Commonly known as: NORVASC Take 1 tablet (2.5 mg total)  by mouth daily.   amoxicillin 500 MG capsule Commonly known as: AMOXIL Take 1 capsule (500 mg total) by mouth 3 (three) times daily. Started by: [name], MD   apixaban 5 MG Tabs tablet Commonly known as: Eliquis Take 1 tablet (5 mg total) by mouth 2 (two) times daily. What changed: how much to take Changed by: [name], MD   B COMPLEX-VITAMIN B12 PO Take by mouth.   escitalopram 10 MG tablet Commonly known as: LEXAPRO Take 1 tablet (10 mg total) by mouth daily.   famotidine 20 MG tablet Commonly known as: PEPCID Take 1 tablet (20 mg total) by mouth every 12 (twelve) hours.   levothyroxine 25 MCG tablet Commonly known as: SYNTHROID Take 1 tablet (25 mcg total) by mouth daily. What changed: Another medication with the same name was removed. Continue taking this medication, and follow the directions you see here. Changed by: [name], MD   metoprolol succinate 100 MG 24 hr tablet Commonly known as: TOPROL-XL Take with or immediately following a meal.   polyethylene glycol powder 17 GM/SCOOP powder Commonly known as: GLYCOLAX/MIRALAX Take 17 g by mouth 2 (two) times daily. For constipation   Vitamin D (Ergocalciferol) 1.25 MG (50000 UNIT) Caps capsule Commonly known as: DRISDOL Take 1 capsule (50,000 Units total) by mouth every 7 (seven) days.       Meds ordered this encounter  Medications   apixaban (ELIQUIS) 5 MG TABS tablet    Sig: Take 1 tablet (5 mg total) by mouth 2 (two) times daily.    Dispense:  60 tablet    Refill:  0   amoxicillin (AMOXIL) 500 MG capsule    Sig: Take 1 capsule (500 mg total) by mouth  3 (three) times daily.    Dispense:  21 capsule    Refill:  0   metoprolol succinate (TOPROL-XL) 100 MG 24 hr tablet    Sig: Take with or immediately following a meal.    Dispense:  90 tablet    Refill:  1   amLODipine (NORVASC) 2.5 MG tablet    Sig: Take 1 tablet (2.5 mg total) by mouth daily.    Dispense:  90 tablet    Refill:  1     escitalopram (LEXAPRO) 10 MG tablet    Sig: Take 1 tablet (10 mg total) by mouth daily.    Dispense:  90 tablet    Refill:  1   famotidine (PEPCID) 20 MG tablet    Sig: Take 1 tablet (20 mg total) by mouth every 12 (twelve) hours.    Dispense:  180 tablet    Refill:  1   levothyroxine (SYNTHROID) 25 MCG tablet    Sig: Take 1 tablet (25 mcg total) by mouth daily.    Dispense:  90 tablet    Refill:  1      Follow-up: No follow-ups on file.  [name], M.D.

## 2020-03-31 ENCOUNTER — Ambulatory Visit: Payer: Medicare Other | Admitting: Family Medicine

## 2020-03-31 LAB — CBC WITH DIFFERENTIAL/PLATELET
Basophils Absolute: 0 10*3/uL (ref 0.0–0.2)
Basos: 0 %
EOS (ABSOLUTE): 0.2 10*3/uL (ref 0.0–0.4)
Eos: 5 %
Hematocrit: 38 % (ref 34.0–46.6)
Hemoglobin: 12.3 g/dL (ref 11.1–15.9)
Immature Grans (Abs): 0 10*3/uL (ref 0.0–0.1)
Immature Granulocytes: 0 %
Lymphocytes Absolute: 1.3 10*3/uL (ref 0.7–3.1)
Lymphs: 28 %
MCH: 30.6 pg (ref 26.6–33.0)
MCHC: 32.4 g/dL (ref 31.5–35.7)
MCV: 95 fL (ref 79–97)
Monocytes Absolute: 0.6 10*3/uL (ref 0.1–0.9)
Monocytes: 12 %
Neutrophils Absolute: 2.6 10*3/uL (ref 1.4–7.0)
Neutrophils: 55 %
Platelets: 203 10*3/uL (ref 150–450)
RBC: 4.02 x10E6/uL (ref 3.77–5.28)
RDW: 13.3 % (ref 11.7–15.4)
WBC: 4.7 10*3/uL (ref 3.4–10.8)

## 2020-03-31 LAB — LIPID PANEL
Chol/HDL Ratio: 3.1 ratio (ref 0.0–4.4)
Cholesterol, Total: 166 mg/dL (ref 100–199)
HDL: 54 mg/dL (ref >39)
LDL Chol Calc (NIH): 89 mg/dL (ref 0–99)
Triglycerides: 131 mg/dL (ref 0–149)
VLDL Cholesterol Cal: 23 mg/dL (ref 5–40)

## 2020-03-31 LAB — TSH+FREE T4
Free T4: 1.17 ng/dL (ref 0.82–1.77)
TSH: 5.87 u[IU]/mL — ABNORMAL HIGH (ref 0.450–4.500)

## 2020-03-31 LAB — CMP14+EGFR
ALT: 17 IU/L (ref 0–32)
AST: 34 IU/L (ref 0–40)
Albumin/Globulin Ratio: 1.4 (ref 1.2–2.2)
Albumin: 4.2 g/dL (ref 3.6–4.6)
Alkaline Phosphatase: 82 IU/L (ref 44–121)
BUN/Creatinine Ratio: 14 (ref 12–28)
BUN: 11 mg/dL (ref 8–27)
Bilirubin Total: 0.3 mg/dL (ref 0.0–1.2)
CO2: 25 mmol/L (ref 20–29)
Calcium: 9.4 mg/dL (ref 8.7–10.3)
Chloride: 102 mmol/L (ref 96–106)
Creatinine, Ser: 0.8 mg/dL (ref 0.57–1.00)
GFR calc Af Amer: 78 mL/min/{1.73_m2} (ref >59)
GFR calc non Af Amer: 68 mL/min/{1.73_m2} (ref >59)
Globulin, Total: 3 g/dL (ref 1.5–4.5)
Glucose: 130 mg/dL — ABNORMAL HIGH (ref 65–99)
Potassium: 4.1 mmol/L (ref 3.5–5.2)
Sodium: 143 mmol/L (ref 134–144)
Total Protein: 7.2 g/dL (ref 6.0–8.5)

## 2020-03-31 LAB — VITAMIN D 25 HYDROXY (VIT D DEFICIENCY, FRACTURES): Vit D, 25-Hydroxy: 20.1 ng/mL — ABNORMAL LOW (ref 30.0–100.0)

## 2020-04-01 LAB — URINE CULTURE

## 2020-04-02 ENCOUNTER — Encounter: Payer: Self-pay | Admitting: Family Medicine

## 2020-04-02 MED ORDER — LEVOTHYROXINE SODIUM 25 MCG PO TABS: 25.0000 ug | ORAL_TABLET | Freq: Every day | ORAL | 1 refills | Status: DC

## 2020-04-22 ENCOUNTER — Other Ambulatory Visit: Payer: Self-pay | Admitting: Family Medicine

## 2020-04-22 DIAGNOSIS — E039 Hypothyroidism, unspecified: Secondary | ICD-10-CM

## 2020-04-26 DIAGNOSIS — R300 Dysuria: Secondary | ICD-10-CM | POA: Diagnosis not present

## 2020-04-26 DIAGNOSIS — N3001 Acute cystitis with hematuria: Secondary | ICD-10-CM | POA: Diagnosis not present

## 2020-04-27 ENCOUNTER — Telehealth: Payer: Self-pay

## 2020-04-27 ENCOUNTER — Other Ambulatory Visit: Payer: Self-pay | Admitting: Family Medicine

## 2020-04-27 DIAGNOSIS — N390 Urinary tract infection, site not specified: Secondary | ICD-10-CM

## 2020-04-27 NOTE — Telephone Encounter (Signed)
REFERRAL REQUEST Telephone Note   Have you been seen at our office for this problem? YES (Advise that they may need an appointment with their PCP before a referral can be done)  Reason for Referral: PT HAS 3 UTI RECENTLY Referral discussed with patient: YES Best contact number of patient for referral team: [phone]   Has patient been seen by a specialist for this issue before: NO Patient provider preference for referral: NA Patient location preference for referral: NA   Patient notified that referrals can take up to a week or longer to process. If they haven't heard anything within a week they should call back and speak with the referral department.

## 2020-04-27 NOTE — Telephone Encounter (Signed)
Urology referral entered. 

## 2020-04-29 DIAGNOSIS — M79671 Pain in right foot: Secondary | ICD-10-CM | POA: Diagnosis not present

## 2020-04-29 DIAGNOSIS — M19071 Primary osteoarthritis, right ankle and foot: Secondary | ICD-10-CM | POA: Diagnosis not present

## 2020-05-13 ENCOUNTER — Other Ambulatory Visit: Payer: Self-pay | Admitting: Family Medicine

## 2020-05-13 DIAGNOSIS — I4891 Unspecified atrial fibrillation: Secondary | ICD-10-CM

## 2020-05-19 DIAGNOSIS — M79676 Pain in unspecified toe(s): Secondary | ICD-10-CM | POA: Diagnosis not present

## 2020-05-19 DIAGNOSIS — L84 Corns and callosities: Secondary | ICD-10-CM | POA: Diagnosis not present

## 2020-05-19 DIAGNOSIS — B351 Tinea unguium: Secondary | ICD-10-CM | POA: Diagnosis not present

## 2020-05-19 DIAGNOSIS — I70203 Unspecified atherosclerosis of native arteries of extremities, bilateral legs: Secondary | ICD-10-CM | POA: Diagnosis not present

## 2020-05-28 ENCOUNTER — Other Ambulatory Visit: Payer: Self-pay

## 2020-05-28 ENCOUNTER — Ambulatory Visit
Admission: EM | Admit: 2020-05-28 | Discharge: 2020-05-28 | Disposition: A | Discharge status: Home / Self Care | Payer: Medicare Other | Attending: Emergency Medicine | Admitting: Emergency Medicine

## 2020-05-28 DIAGNOSIS — R443 Hallucinations, unspecified: Secondary | ICD-10-CM | POA: Diagnosis not present

## 2020-05-28 DIAGNOSIS — N3001 Acute cystitis with hematuria: Secondary | ICD-10-CM

## 2020-05-28 LAB — POCT URINALYSIS DIP (MANUAL ENTRY)
Bilirubin, UA: NEGATIVE
Glucose, UA: NEGATIVE mg/dL
Ketones, POC UA: NEGATIVE mg/dL
Nitrite, UA: NEGATIVE
Protein Ur, POC: NEGATIVE mg/dL
Spec Grav, UA: 1.015 (ref 1.010–1.025)
Urobilinogen, UA: 0.2 E.U./dL
pH, UA: 7 (ref 5.0–8.0)

## 2020-05-28 MED ORDER — CEPHALEXIN 500 MG PO CAPS: 500.0000 mg | ORAL_CAPSULE | Freq: Two times a day (BID) | ORAL | 0 refills | Status: AC

## 2020-05-28 NOTE — ED Provider Notes (Signed)
[facility]   CC: Burning with urination  SUBJECTIVE: HPI obtained from caregiver Crystal Browning is a 86 y.o. female who has concern for UTI.  Patient has been having hallucinations x 3 days and CG states this occurs when the patient has UTIs.  Patient denies a precipitating event.  Denies abdominal or flank pain.  Denies alleviating or aggravating factors.  Admits to similar symptoms in the past with UTI.  Denies fever, chills, nausea, vomiting, abdominal pain, flank pain, dysuria, urinary urgency, urinary frequency.    LMP: No LMP recorded. Patient has had a hysterectomy.  ROS: As in HPI.  All other pertinent ROS negative.     Past Medical History:  Diagnosis Date  . Bilateral lower extremity edema 10/30/2016  . Cancer ([place]) 03/28/11   SCCA of the Supraglottic Larynx (T2NoMo)    ; S/P Chemoradiation therapy from 05/09/05 thru 06/29/05  . Depression    History of Depression- Lexapro therapy  . Diverticulosis 02/2005   History of diverticulosis with admission from 8/18 - 03/04/2005  . Esophageal stricture 03/03/2018  . Generalized abdominal pain 07/17/2015  . History of chemotherapy    S/P chemotherapy with Dr. Sonny Dandy -2006  . Hypertension   . Hypothyroid   . Osteoporosis    History of Osteoporosis with one year of Actonel only  . Radiation    S/P radiation thrapy -06/2005  . TIA (transient ischemic attack)    History of TIA's with no residual effect   Past Surgical History:  Procedure Laterality Date  . CARDIOVERSION N/A 11/23/2016   Procedure: CARDIOVERSION;  Surgeon: Pixie Casino, MD;  Location: [facility] ENDOSCOPY;  Service: Cardiovascular;  Laterality: N/A;  . CATARACT EXTRACTION, BILATERAL    . DIRECT LARYNGOSCOPY  03/27/2005   S/P direct laryngoscopy, esophagoscopy and biopsy with Dr. Constance Holster  . TOTAL ABDOMINAL HYSTERECTOMY W/ BILATERAL SALPINGOOPHORECTOMY  1986  . TUBAL LIGATION     Allergies  Allergen Reactions  . Tuberculin Tests Swelling  . Vit D-Vit E-Safflower  Oil   . Paroxetine Hcl Rash   Current Facility-Administered Medications on File Prior to Encounter  Medication Dose Route Frequency Provider Last Rate Last Admin  . 0.9 %  sodium chloride infusion  500 mL Intravenous Once Irene Shipper, MD       Current Outpatient Medications on File Prior to Encounter  Medication Sig Dispense Refill  . acetaminophen (TYLENOL) 325 MG tablet Take 2 tablets (650 mg total) by mouth every 6 (six) hours as needed for mild pain, fever or headache. 12 tablet 0  . amLODipine (NORVASC) 2.5 MG tablet Take 1 tablet (2.5 mg total) by mouth daily. 90 tablet 1  . B Complex-Folic Acid (B COMPLEX-VITAMIN B12 PO) Take by mouth.    Arne Cleveland 5 MG TABS tablet TAKE 1 TABLET BY MOUTH TWICE A DAY 60 tablet 0  . escitalopram (LEXAPRO) 10 MG tablet Take 1 tablet (10 mg total) by mouth daily. 90 tablet 1  . famotidine (PEPCID) 20 MG tablet Take 1 tablet (20 mg total) by mouth every 12 (twelve) hours. 180 tablet 1  . levothyroxine (SYNTHROID) 25 MCG tablet Take 1 tablet (25 mcg total) by mouth daily. 90 tablet 1  . metoprolol succinate (TOPROL-XL) 100 MG 24 hr tablet Take with or immediately following a meal. 90 tablet 1  . polyethylene glycol powder (GLYCOLAX/MIRALAX) 17 GM/SCOOP powder Take 17 g by mouth 2 (two) times daily. For constipation 850 g 3  . Vitamin D, Ergocalciferol, (DRISDOL) 1.25 MG (  50000 UNIT) CAPS capsule Take 1 capsule (50,000 Units total) by mouth every 7 (seven) days. (Patient not taking: Reported on 11/23/2019) 13 capsule 3   Social History   Socioeconomic History  . Marital status: Widowed    Spouse name: Not on file  . Number of children: 6  . Years of education: Not on file  . Highest education level: Not on file  Occupational History  . Not on file  Tobacco Use  . Smoking status: Never Smoker  . Smokeless tobacco: Never Used  Vaping Use  . Vaping Use: Never used  Substance and Sexual Activity  . Alcohol use: No  . Drug use: No  . Sexual  activity: Not on file  Other Topics Concern  . Not on file  Social History Narrative  . Not on file   Social Determinants of Health   Financial Resource Strain:   . Difficulty of Paying Living Expenses: Not on file  Food Insecurity:   . Worried About Charity fundraiser in the Last Year: Not on file  . Ran Out of Food in the Last Year: Not on file  Transportation Needs:   . Lack of Transportation (Medical): Not on file  . Lack of Transportation (Non-Medical): Not on file  Physical Activity:   . Days of Exercise per Week: Not on file  . Minutes of Exercise per Session: Not on file  Stress:   . Feeling of Stress : Not on file  Social Connections:   . Frequency of Communication with Friends and Family: Not on file  . Frequency of Social Gatherings with Friends and Family: Not on file  . Attends Religious Services: Not on file  . Active Member of Clubs or Organizations: Not on file  . Attends Archivist Meetings: Not on file  . Marital Status: Not on file  Intimate Partner Violence:   . Fear of Current or Ex-Partner: Not on file  . Emotionally Abused: Not on file  . Physically Abused: Not on file  . Sexually Abused: Not on file   Family History  Problem Relation Age of Onset  . Stroke Mother   . Heart disease Mother 50  . Cancer Father 30       Prostate  . Alcohol abuse Brother   . COPD Brother     OBJECTIVE:  Vitals:   05/28/20 1017  BP: (!) 159/69  Pulse: 75  Resp: 18  Temp: 97.6 F (36.4 C)  TempSrc: Tympanic  SpO2: 93%   General appearance: Alert in no acute distress HEENT: NCAT.  Oropharynx clear.  Lungs: clear to auscultation bilaterally without adventitious breath sounds Heart: regular rate and rhythm.   Abdomen: soft; non-distended; no tenderness; bowel sounds present; no guarding Back: no CVA tenderness Extremities: no edema; symmetrical with no gross deformities Skin: warm and dry Neurologic: Ambulates from chair to exam table without  difficulty Psychological: alert and cooperative; normal mood and affect  Labs Reviewed  POCT URINALYSIS DIP (MANUAL ENTRY) - Abnormal; Notable for the following components:      Result Value   Blood, UA trace-intact (*)    Leukocytes, UA Trace (*)    All other components within normal limits  URINE CULTURE    ASSESSMENT & PLAN:  1. Hallucinations   2. Acute cystitis with hematuria     Meds ordered this encounter  Medications  . cephALEXin (KEFLEX) 500 MG capsule    Sig: Take 1 capsule (500 mg total) by mouth 2 (two)  times daily for 10 days.    Dispense:  20 capsule    Refill:  0    Order Specific Question:   Supervising Provider    Answer:   Raylene Everts [[id number]]   CG= caregiver  Unable to rule out psychological or neurological causes of hallucinations in urgent care setting.  Offered patient/ CG further evaluation and management in the ED.  Patient/ CG declines at this time and would like to try outpatient therapy first.  Aware of the risk associated with this decision including missed diagnosis, organ damage, organ failure, and/or death.  Patient/ CG aware and in agreement.     Urine concerning for UTI with trace Netzel blood cells Urine culture sent.  We will call you with abnormal results.   Push fluids and get plenty of rest.   Take antibiotic as directed and to completion Follow up with PCP next week for recheck to ensure symptoms are improving Return here or go to ER if you have any new or worsening symptoms such as fever, worsening abdominal pain, nausea/vomiting, flank pain, etc...  Outlined signs and symptoms indicating need for more acute intervention. Patient verbalized understanding. After Visit Summary given.     Lestine Box, PA-C 05/28/20 1058

## 2020-05-28 NOTE — Discharge Instructions (Signed)
CG= caregiver  Unable to rule out psychological or neurological causes of hallucinations in urgent care setting.  Offered patient/ CG further evaluation and management in the ED.  Patient/ CG declines at this time and would like to try outpatient therapy first.  Aware of the risk associated with this decision including missed diagnosis, organ damage, organ failure, and/or death.  Patient/ CG aware and in agreement.     Urine concerning for UTI with trace Leggitt blood cells Urine culture sent.  We will call you with abnormal results.   Push fluids and get plenty of rest.   Take antibiotic as directed and to completion Follow up with PCP next week for recheck to ensure symptoms are improving Return here or go to ER if you have any new or worsening symptoms such as fever, worsening abdominal pain, nausea/vomiting, flank pain, etc..Marland Kitchen

## 2020-05-28 NOTE — ED Triage Notes (Signed)
Pt brought in for possible UTI, per CG pt has had hallucinations for past 3 days and states that this happens with UTIs

## 2020-05-29 LAB — URINE CULTURE

## 2020-05-30 ENCOUNTER — Other Ambulatory Visit: Payer: Self-pay

## 2020-05-30 ENCOUNTER — Emergency Department (HOSPITAL_COMMUNITY): Payer: Medicare Other

## 2020-05-30 ENCOUNTER — Emergency Department (HOSPITAL_COMMUNITY)
Admission: EM | Admit: 2020-05-30 | Discharge: 2020-05-31 | Disposition: A | Discharge status: Home / Self Care | Payer: Medicare Other | Attending: Emergency Medicine | Admitting: Emergency Medicine

## 2020-05-30 ENCOUNTER — Encounter (HOSPITAL_COMMUNITY): Payer: Self-pay

## 2020-05-30 ENCOUNTER — Other Ambulatory Visit: Payer: Self-pay | Admitting: Family Medicine

## 2020-05-30 ENCOUNTER — Telehealth: Payer: Self-pay

## 2020-05-30 DIAGNOSIS — E039 Hypothyroidism, unspecified: Secondary | ICD-10-CM | POA: Diagnosis not present

## 2020-05-30 DIAGNOSIS — R441 Visual hallucinations: Secondary | ICD-10-CM | POA: Diagnosis not present

## 2020-05-30 DIAGNOSIS — R440 Auditory hallucinations: Secondary | ICD-10-CM | POA: Insufficient documentation

## 2020-05-30 DIAGNOSIS — Z7901 Long term (current) use of anticoagulants: Secondary | ICD-10-CM | POA: Diagnosis not present

## 2020-05-30 DIAGNOSIS — Z79899 Other long term (current) drug therapy: Secondary | ICD-10-CM | POA: Insufficient documentation

## 2020-05-30 DIAGNOSIS — R410 Disorientation, unspecified: Secondary | ICD-10-CM

## 2020-05-30 DIAGNOSIS — R443 Hallucinations, unspecified: Secondary | ICD-10-CM

## 2020-05-30 DIAGNOSIS — Z8521 Personal history of malignant neoplasm of larynx: Secondary | ICD-10-CM | POA: Insufficient documentation

## 2020-05-30 DIAGNOSIS — R059 Cough, unspecified: Secondary | ICD-10-CM | POA: Diagnosis not present

## 2020-05-30 DIAGNOSIS — I10 Essential (primary) hypertension: Secondary | ICD-10-CM | POA: Diagnosis not present

## 2020-05-30 DIAGNOSIS — I160 Hypertensive urgency: Secondary | ICD-10-CM

## 2020-05-30 DIAGNOSIS — R4182 Altered mental status, unspecified: Secondary | ICD-10-CM | POA: Diagnosis not present

## 2020-05-30 LAB — CBC WITH DIFFERENTIAL/PLATELET
Abs Immature Granulocytes: 0.02 10*3/uL (ref 0.00–0.07)
Basophils Absolute: 0 10*3/uL (ref 0.0–0.1)
Basophils Relative: 0 %
Eosinophils Absolute: 0.3 10*3/uL (ref 0.0–0.5)
Eosinophils Relative: 5 %
HCT: 41.3 % (ref 36.0–46.0)
Hemoglobin: 13 g/dL (ref 12.0–15.0)
Immature Granulocytes: 0 %
Lymphocytes Relative: 19 %
Lymphs Abs: 1.2 10*3/uL (ref 0.7–4.0)
MCH: 31.5 pg (ref 26.0–34.0)
MCHC: 31.5 g/dL (ref 30.0–36.0)
MCV: 100 fL (ref 80.0–100.0)
Monocytes Absolute: 0.5 10*3/uL (ref 0.1–1.0)
Monocytes Relative: 8 %
Neutro Abs: 4.4 10*3/uL (ref 1.7–7.7)
Neutrophils Relative %: 68 %
Platelets: 206 10*3/uL (ref 150–400)
RBC: 4.13 MIL/uL (ref 3.87–5.11)
RDW: 14.1 % (ref 11.5–15.5)
WBC: 6.5 10*3/uL (ref 4.0–10.5)
nRBC: 0 % (ref 0.0–0.2)

## 2020-05-30 LAB — URINALYSIS, ROUTINE W REFLEX MICROSCOPIC
Bilirubin Urine: NEGATIVE
Glucose, UA: NEGATIVE mg/dL
Hgb urine dipstick: NEGATIVE
Ketones, ur: NEGATIVE mg/dL
Leukocytes,Ua: NEGATIVE
Nitrite: NEGATIVE
Protein, ur: NEGATIVE mg/dL
Specific Gravity, Urine: 1.013 (ref 1.005–1.030)
pH: 7 (ref 5.0–8.0)

## 2020-05-30 LAB — BASIC METABOLIC PANEL
Anion gap: 7 (ref 5–15)
BUN: 14 mg/dL (ref 8–23)
CO2: 27 mmol/L (ref 22–32)
Calcium: 8.8 mg/dL — ABNORMAL LOW (ref 8.9–10.3)
Chloride: 102 mmol/L (ref 98–111)
Creatinine, Ser: 0.94 mg/dL (ref 0.44–1.00)
GFR, Estimated: 60 mL/min — ABNORMAL LOW (ref >60)
Glucose, Bld: 108 mg/dL — ABNORMAL HIGH (ref 70–99)
Potassium: 3.9 mmol/L (ref 3.5–5.1)
Sodium: 136 mmol/L (ref 135–145)

## 2020-05-30 IMAGING — CT CT HEAD W/O CM
3 of 6 series · 13 of 47 positions shown, 15 images · non-contrast
Comparison: [DATE]

CLINICAL DATA: Mental status change

EXAM:
CT HEAD WITHOUT CONTRAST
TECHNIQUE: Contiguous axial images were obtained from the base of the skull
through the vertex without intravenous contrast.

[Series 2: head w o · axial · 0.44mm/px · z∈[+260,+380]mm · 8 of 30 slices shown, 10 images]
[im 3/30  brain]
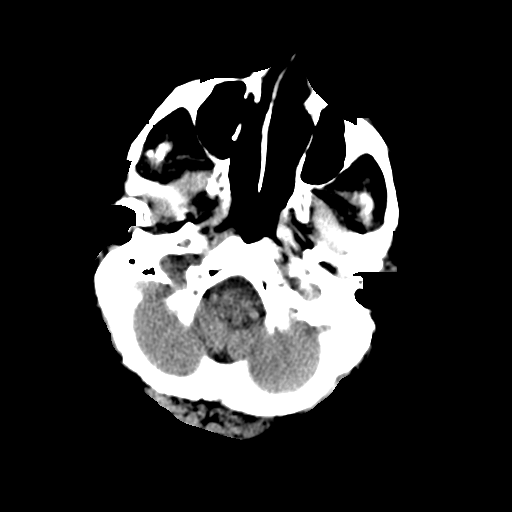
[im 3/30  bone]
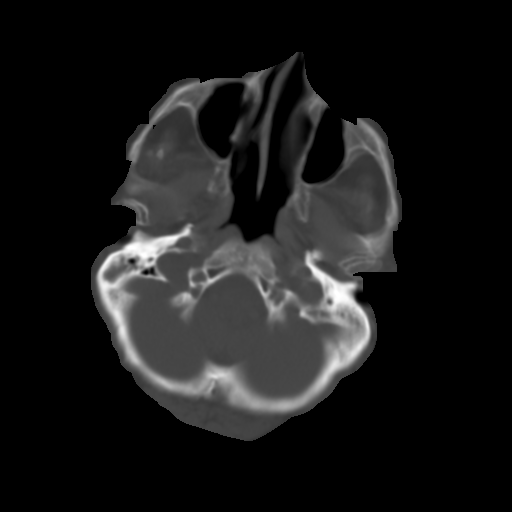
[im 6/30  brain]
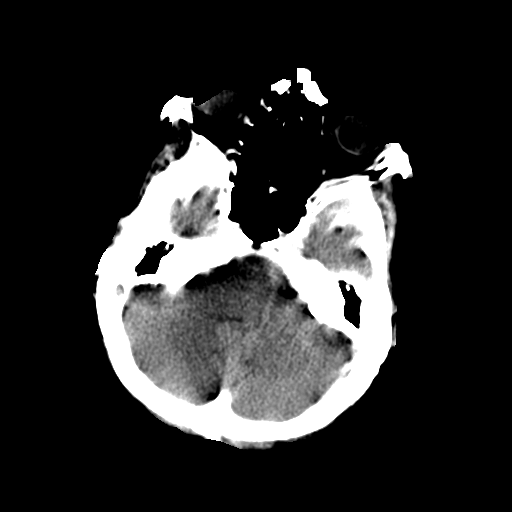
[im 10/30  brain]
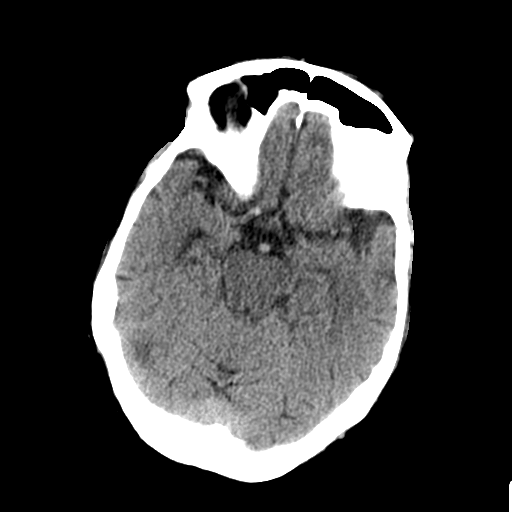
[im 13/30  brain]
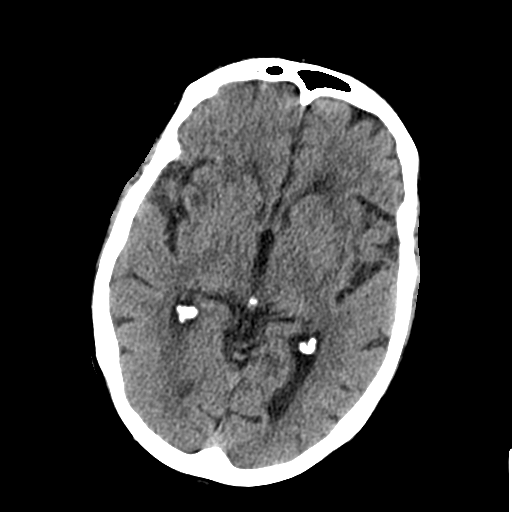
[im 17/30  brain]
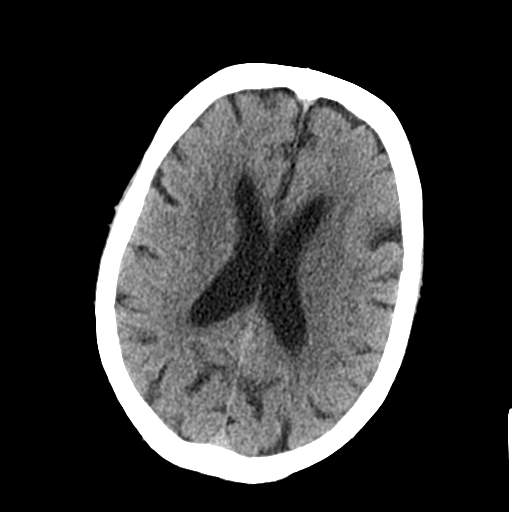
[im 17/30  bone]
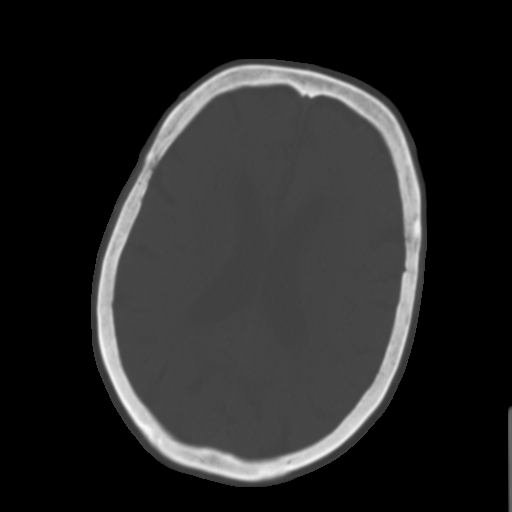
[im 20/30  brain]
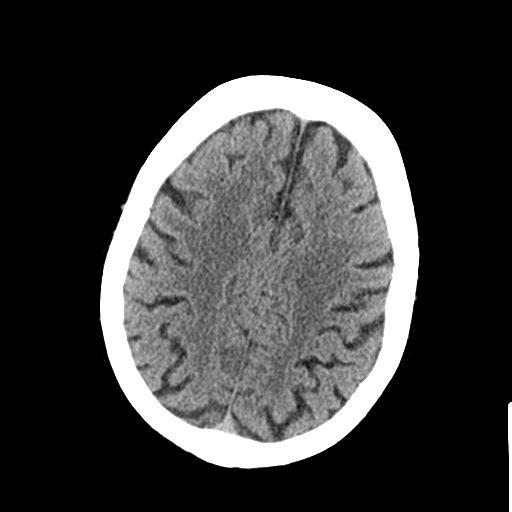
[im 24/30  brain]
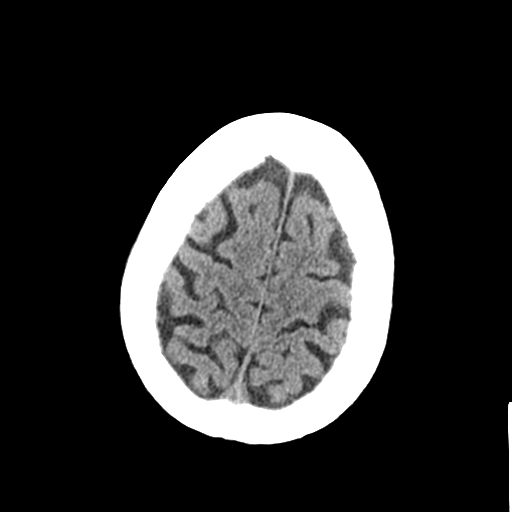
[im 27/30  brain]
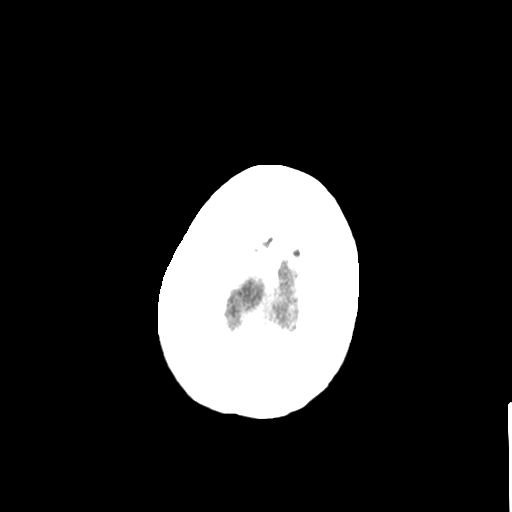

[Series 4: coronal soft · coronal · 0.31mm/px · 3 of 68 slices shown]
[im 17/68  brain]
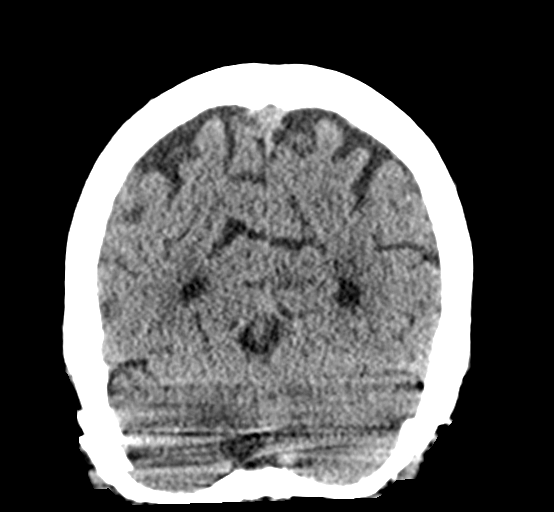
[im 34/68  brain]
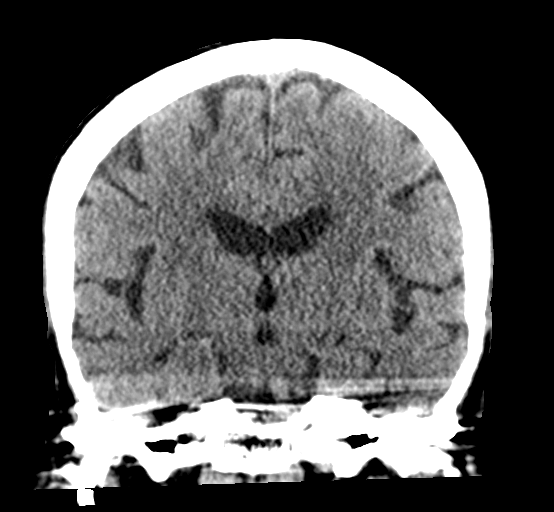
[im 51/68  brain]
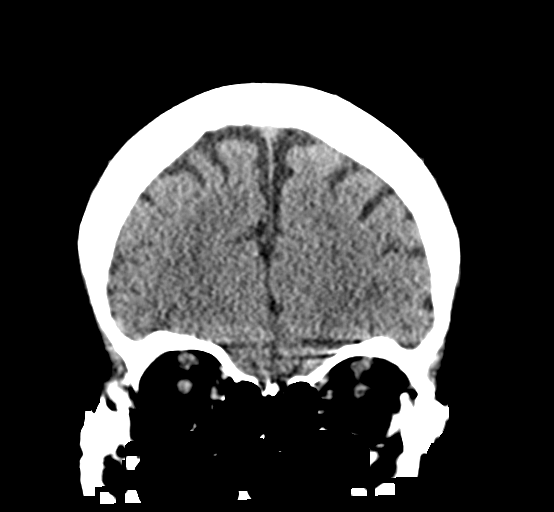

[Series 9: sagittal soft · sagittal · 0.13mm/px · 2 of 52 slices shown]
[im 18/52  brain]
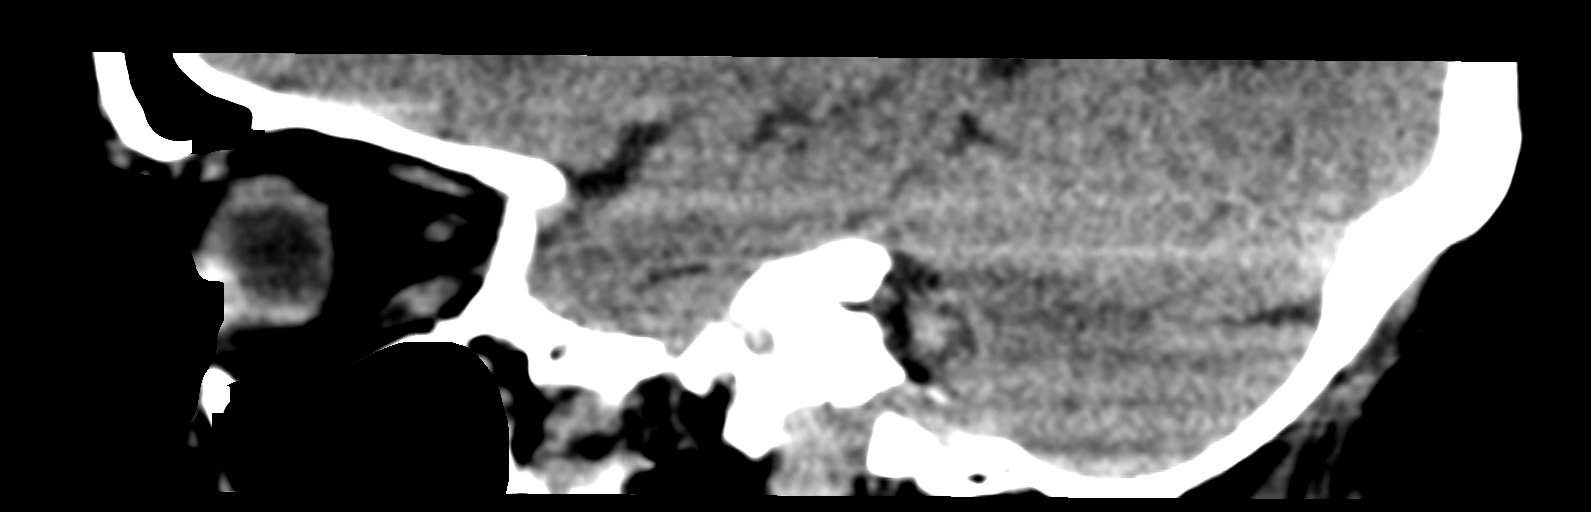
[im 35/52  brain]
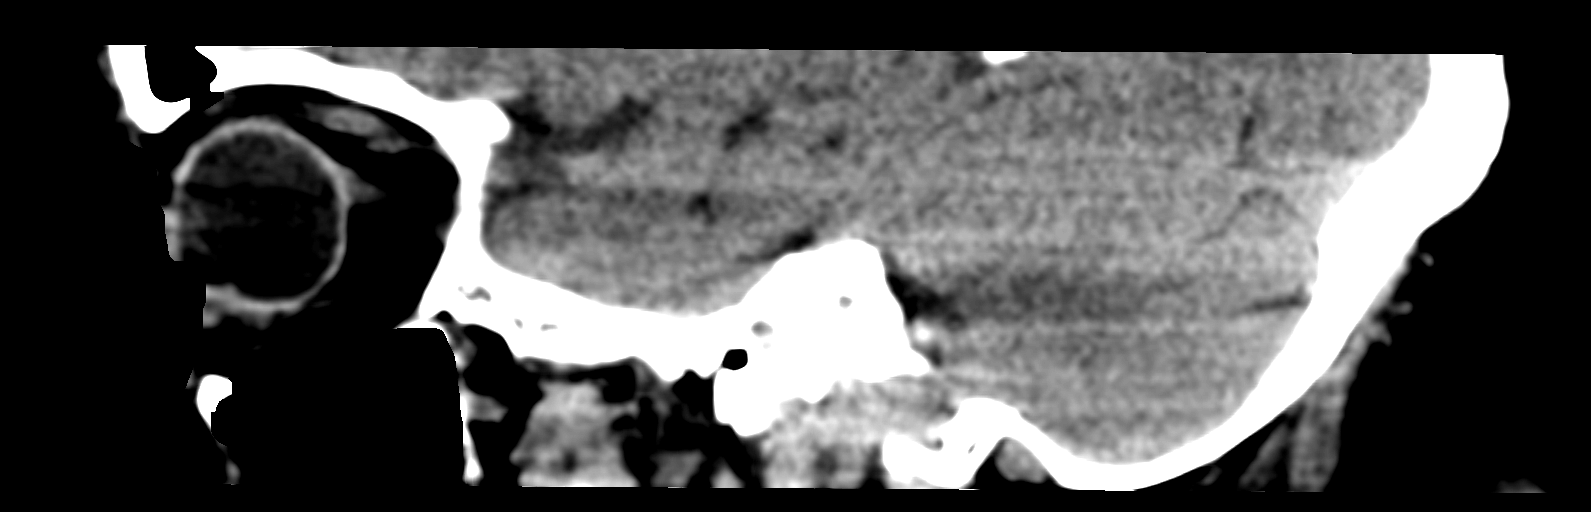

[13 of 47 positions shown; findings below may reference images not displayed]

FINDINGS: Brain: No evidence of acute territorial infarction, hemorrhage,
hydrocephalus,extra-axial collection or mass lesion/mass effect.
There is dilatation the ventricles and sulci consistent with
age-related atrophy. Low-attenuation changes in the deep white
matter consistent with small vessel ischemia.

Vascular: No hyperdense vessel or unexpected calcification.

Skull: The skull is intact. No fracture or focal lesion identified.

Sinuses/Orbits: The visualized paranasal sinuses and mastoid air
cells are clear. The orbits and globes intact.

Other: None
IMPRESSION: No acute intracranial abnormality.

Findings consistent with age related atrophy and chronic small
vessel ischemia

## 2020-05-30 IMAGING — DX DG CHEST 1V PORT
1 series · 1 of 1 positions shown · non-contrast
Comparison: [DATE]

CLINICAL DATA: Cough

EXAM:
PORTABLE CHEST 1 VIEW

[chest ap]
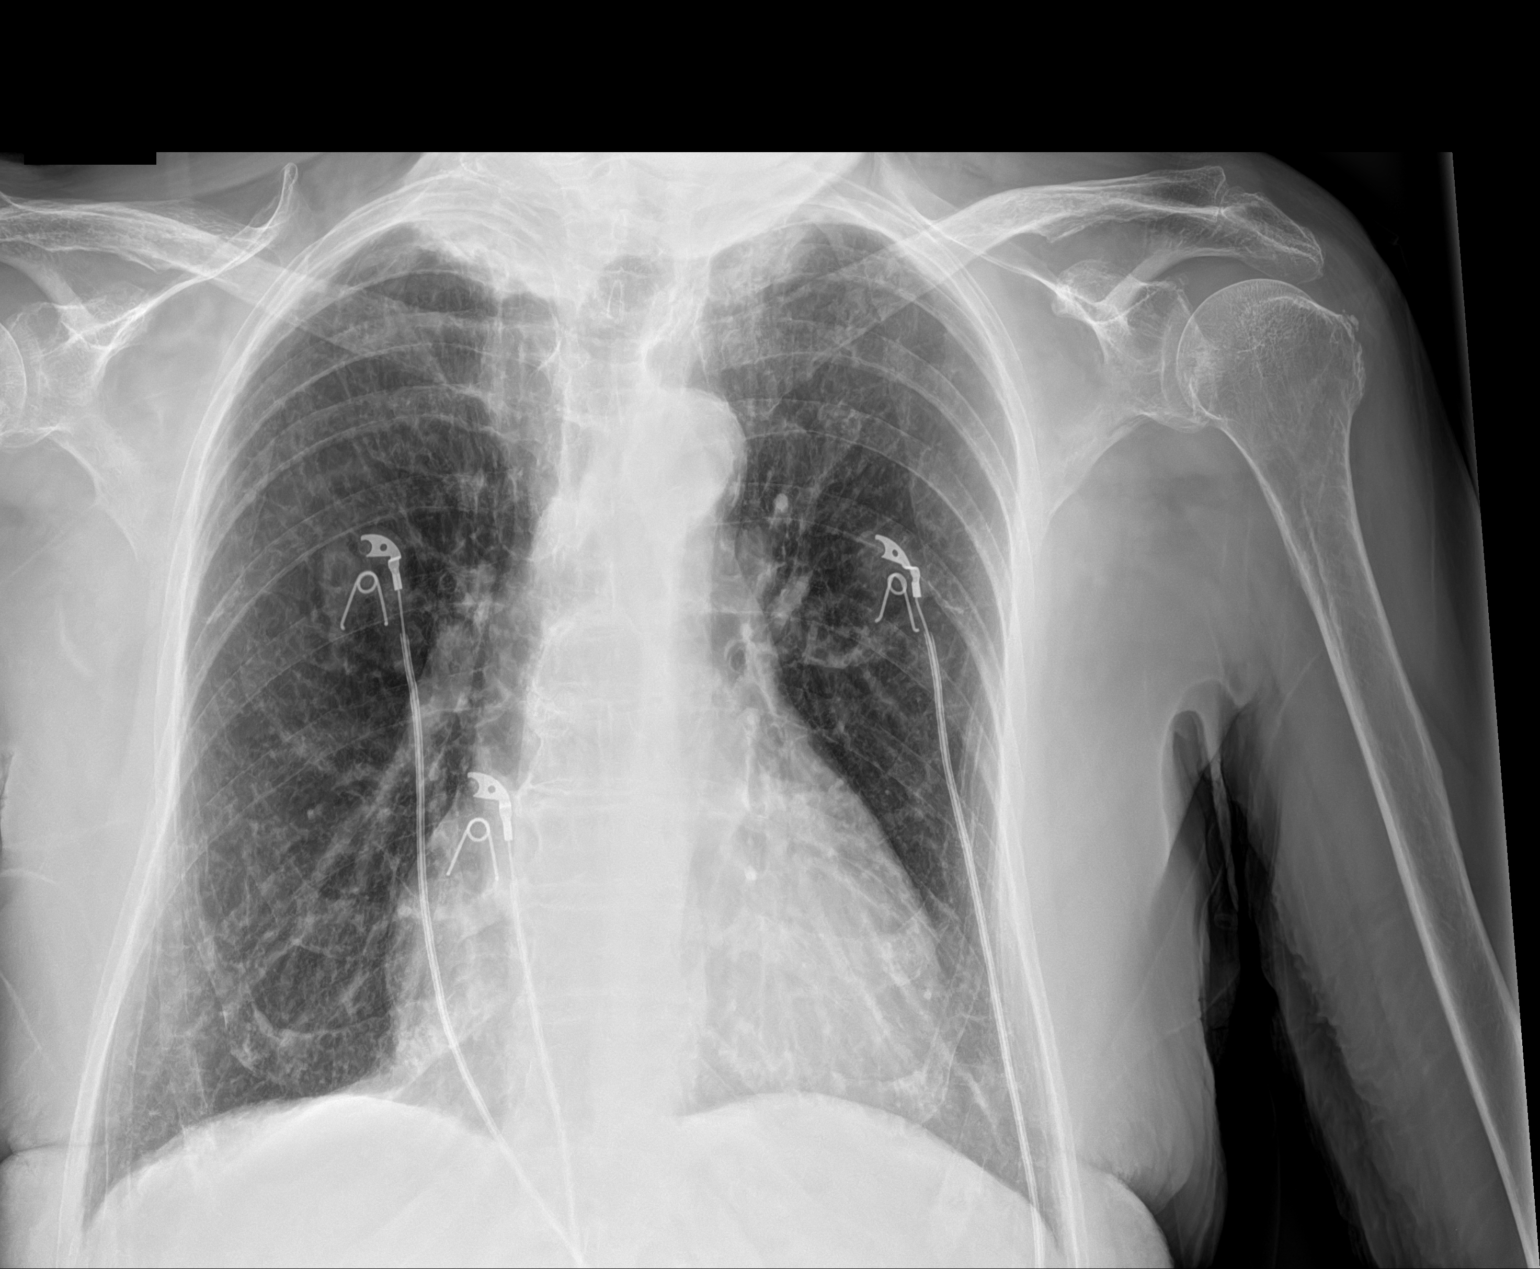

[1 of 1 positions shown; findings below may reference images not displayed]

FINDINGS: There is hyperinflation of the lungs compatible with COPD. Heart is
borderline in size. Biapical scarring, stable. No acute confluent
opacities or effusions. No acute bony abnormality.
IMPRESSION: COPD/chronic changes.  No active disease.

## 2020-05-30 MED ORDER — LABETALOL HCL 5 MG/ML IV SOLN: 5.0000 mg | Freq: Once | INTRAVENOUS | Status: DC

## 2020-05-30 MED ORDER — HYDRALAZINE HCL 10 MG PO TABS
10.0000 mg | ORAL_TABLET | Freq: Once | ORAL | Status: AC
  Administered 2020-05-30: 10 mg via ORAL
  Filled 2020-05-30 (×2): qty 1

## 2020-05-30 NOTE — Telephone Encounter (Signed)
Pts daughter in law called stating that she took pt to the Urgent Care on 05/28/20 because she was acting delusional. Says pts urine was tested and pt did have blood in her urine but was told that it wasn't a full blown UTI. Recommended that pt speak with her PCP about getting an order to have a CT Scan done on her brain and also have blood work done at the hospital so that pt wouldn't have to wait as long to be seen.  Please call pts daughter in law Mylei Brackeen) at [phone].

## 2020-05-30 NOTE — ED Triage Notes (Addendum)
Pt has been treated for UTI by Urgent Care. Pt daughter in law states she has been saying that has been hearing people in the house, kids laughing x 1 week. Pt oriented x 4 in triage.

## 2020-05-30 NOTE — Telephone Encounter (Signed)
Returned call- appointment scheduled with PCP 11/18.  Daughter in law states that urgent care told her to call patients PCP to get het scheduled for a CT head  & lab work since she was still confused ASAP.  Requesting that this get scheduled today or tomorrow.  Aware that this would have to be approved thought PCP and they might have to wait until her appointment.  Please review and advise

## 2020-05-30 NOTE — ED Provider Notes (Signed)
[facility] EMERGENCY DEPARTMENT Provider Note   CSN: [id number] Arrival date & time: 05/30/20  1806     History Chief Complaint  Patient presents with  . Hallucinations    Crystal Browning is a 86 y.o. female.  HPI      Crystal Browning is a 86 y.o. female with past history of atrial fibrillation anticoagulated with Eliquis, HTN, hypothyroid who presents to the Emergency Department for concerns of possible UTI.  patient is accompanied by her daughter in law who states that patient has been experiencing auditory and visual hallucinations for nearly one week.  States that she hears people talking in the next room, but they are not speaking to her and she has seen "something Bov[place]" but when she reached out to touch it, it wasn't there.  She states she has had similar episodes of this in the past with UTI's. She was seen at the local urgent care on 05/28/20 and treated with cephalexin.  Daughter in law is concerned because her symptoms are not improving.  Daughter-in-law also states that she was contacted by the urgent care today and told that her urine did not show an obvious infection and advised to bring her to the emergency department for further evaluation and possible CT of her head.  Patient complains of malaise, but otherwise denies symptoms.  States her appetite has been good, denies fever, abdominal pain, back pain, dysuria, hematuria nausea or vomiting. Patient does live alone.      Past Medical History:  Diagnosis Date  . Bilateral lower extremity edema 10/30/2016  . Cancer ([place]) 03/28/11   SCCA of the Supraglottic Larynx (T2NoMo)    ; S/P Chemoradiation therapy from 05/09/05 thru 06/29/05  . Depression    History of Depression- Lexapro therapy  . Diverticulosis 02/2005   History of diverticulosis with admission from 8/18 - 03/04/2005  . Esophageal stricture 03/03/2018  . Generalized abdominal pain 07/17/2015  . History of chemotherapy    S/P chemotherapy with Dr. Sonny Dandy -2006  .  Hypertension   . Hypothyroid   . Osteoporosis    History of Osteoporosis with one year of Actonel only  . Radiation    S/P radiation thrapy -06/2005  . TIA (transient ischemic attack)    History of TIA's with no residual effect    Patient Active Problem List   Diagnosis Date Noted  . CAP (community acquired pneumonia) 10/04/2019  . Pharyngoesophageal dysphagia 01/14/2018  . Long term (current) use of anticoagulants 12/14/2016  . Atrial fibrillation ([place]) 10/30/2016  . Other fatigue 10/30/2016  . Venous insufficiency of both lower extremities 10/30/2016  . History of external beam radiation therapy 08/20/2016  . History of laryngeal cancer 08/20/2016  . Xerostomia 08/20/2016  . Postmenopausal 07/17/2015  . Thyroid activity decreased 07/17/2015  . Essential hypertension 07/12/2015  . Angina pectoris ([place]) 07/12/2015  . Vitamin D deficiency 07/12/2015  . 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 CARDIOVERSION N/A 11/23/2016   Procedure: CARDIOVERSION;  Surgeon: Pixie Casino, MD;  Location: [facility] ENDOSCOPY;  Service: Cardiovascular;  Laterality: N/A;  . CATARACT EXTRACTION, BILATERAL    . DIRECT LARYNGOSCOPY  03/27/2005   S/P direct laryngoscopy, esophagoscopy and biopsy with Dr. Constance Holster  . TOTAL ABDOMINAL HYSTERECTOMY W/ BILATERAL SALPINGOOPHORECTOMY  1986  . TUBAL LIGATION       OB History   No obstetric history on file.     Family History  Problem Relation Age of Onset  . Stroke Mother   . Heart disease Mother 45  . Cancer Father 47       Prostate  . Alcohol abuse Brother   . COPD Brother     Social History   Tobacco Use  . Smoking status: Never Smoker  . Smokeless tobacco: Never Used  Vaping Use  . Vaping Use: Never used  Substance Use Topics  . Alcohol use: No  . Drug use: No    Home Medications Prior to Admission medications   Medication Sig Start Date End Date Taking? Authorizing  Provider  acetaminophen (TYLENOL) 325 MG tablet Take 2 tablets (650 mg total) by mouth every 6 (six) hours as needed for mild pain, fever or headache. 10/07/19   Denton Brick, Courage, MD  amLODipine (NORVASC) 2.5 MG tablet Take 1 tablet (2.5 mg total) by mouth daily. 03/30/20   [name], MD  B Complex-Folic Acid (B COMPLEX-VITAMIN B12 PO) Take by mouth.    [provider]  cephALEXin (KEFLEX) 500 MG capsule Take 1 capsule (500 mg total) by mouth 2 (two) times daily for 10 days. 05/28/20 06/07/20  Wurst, Marye Round, PA-C  ELIQUIS 5 MG TABS tablet TAKE 1 TABLET BY MOUTH TWICE A DAY 05/13/20   [name], MD  escitalopram (LEXAPRO) 10 MG tablet Take 1 tablet (10 mg total) by mouth daily. 03/30/20   [name], MD  famotidine (PEPCID) 20 MG tablet Take 1 tablet (20 mg total) by mouth every 12 (twelve) hours. 03/30/20   [name], MD  levothyroxine (SYNTHROID) 25 MCG tablet Take 1 tablet (25 mcg total) by mouth daily. 04/02/20   [name], MD  metoprolol succinate (TOPROL-XL) 100 MG 24 hr tablet Take with or immediately following a meal. 03/30/20   [name], MD  polyethylene glycol powder (GLYCOLAX/MIRALAX) 17 GM/SCOOP powder Take 17 g by mouth 2 (two) times daily. For constipation 10/07/19   Roxan Hockey, MD  Vitamin D, Ergocalciferol, (DRISDOL) 1.25 MG (50000 UNIT) CAPS capsule Take 1 capsule (50,000 Units total) by mouth every 7 (seven) days. Patient not taking: Reported on 11/23/2019 09/30/19 09/28/20  [name], MD    Allergies    Tuberculin tests, Vit d-vit e-safflower oil, and Paroxetine hcl  Review of Systems   Review of Systems  Constitutional: Negative for activity change, appetite change, chills, fatigue and fever.  HENT: Negative for trouble swallowing.   Respiratory: Negative for cough, shortness of breath and wheezing.   Cardiovascular: Negative for chest pain and palpitations.  Gastrointestinal: Negative for abdominal pain, diarrhea, nausea and  vomiting.  Genitourinary: Negative for decreased urine volume, difficulty urinating, dysuria, flank pain and hematuria.  Musculoskeletal: Negative for arthralgias, myalgias, neck pain and neck stiffness.  Skin: Negative for rash.  Neurological: Negative for dizziness, syncope, weakness and numbness.  Hematological: Does not bruise/bleed easily.    Physical Exam Updated Vital Signs BP (!) 178/73   Pulse 68   Temp 97.7 F (36.5 C) (Oral)   Resp 16   Ht 5\' 1"  (1.549 m)   Wt 56.2 kg   SpO2 97%   BMI 23.43 kg/m   Physical Exam Vitals and nursing note reviewed.  Constitutional:      Appearance: Normal appearance. She is not ill-appearing or toxic-appearing.     Comments: Elderly, frail appearing.    HENT:     Head: Normocephalic.  Eyes:     Pupils: Pupils are equal, round, and reactive to light.  Neck:  Thyroid: No thyromegaly.     Meningeal: Kernig's sign absent.  Cardiovascular:     Rate and Rhythm: Normal rate and regular rhythm.     Pulses: Normal pulses.  Pulmonary:     Effort: Pulmonary effort is normal.     Breath sounds: Normal breath sounds. No wheezing.  Abdominal:     Palpations: Abdomen is soft.     Tenderness: There is no abdominal tenderness. There is no guarding or rebound.  Musculoskeletal:        General: Normal range of motion.     Cervical back: Normal range of motion and neck supple.     Right lower leg: No edema.     Left lower leg: No edema.  Skin:    General: Skin is warm.     Capillary Refill: Capillary refill takes less than 2 seconds.     Findings: No rash.  Neurological:     General: No focal deficit present.     Mental Status: She is alert and oriented to person, place, and time.     Sensory: No sensory deficit.     Motor: No weakness.     ED Results / Procedures / Treatments   Labs (all labs ordered are listed, but only abnormal results are displayed) Labs Reviewed  BASIC METABOLIC PANEL - Abnormal; Notable for the following  components:      Result Value   Glucose, Bld 108 (*)    Calcium 8.8 (*)    GFR, Estimated 60 (*)    All other components within normal limits  URINE CULTURE  CBC WITH DIFFERENTIAL/PLATELET  URINALYSIS, ROUTINE W REFLEX MICROSCOPIC    EKG None  Radiology CT Head Wo Contrast  Result Date: 05/30/2020 CLINICAL DATA:  Mental status change EXAM: CT HEAD WITHOUT CONTRAST TECHNIQUE: Contiguous axial images were obtained from the base of the skull through the vertex without intravenous contrast. COMPARISON:  June 26, 2014 FINDINGS: Brain: No evidence of acute territorial infarction, hemorrhage, hydrocephalus,extra-axial collection or mass lesion/mass effect. There is dilatation the ventricles and sulci consistent with age-related atrophy. Low-attenuation changes in the deep [place] matter consistent with small vessel ischemia. Vascular: No hyperdense vessel or unexpected calcification. Skull: The skull is intact. No fracture or focal lesion identified. Sinuses/Orbits: The visualized paranasal sinuses and mastoid air cells are clear. The orbits and globes intact. Other: None IMPRESSION: No acute intracranial abnormality. Findings consistent with age related atrophy and chronic small vessel ischemia Electronically Signed   By: Prudencio Pair M.D.   On: 05/30/2020 21:57   DG Chest Portable 1 View  Result Date: 05/30/2020 CLINICAL DATA:  Cough EXAM: PORTABLE CHEST 1 VIEW COMPARISON:  10/06/2019 FINDINGS: There is hyperinflation of the lungs compatible with COPD. Heart is borderline in size. Biapical scarring, stable. No acute confluent opacities or effusions. No acute bony abnormality. IMPRESSION: COPD/chronic changes.  No active disease. Electronically Signed   By: Rolm Baptise M.D.   On: 05/30/2020 19:51    Procedures Procedures (including critical care time)  Medications Ordered in ED Medications - No data to display  ED Course  I have reviewed the triage vital signs and the nursing  notes.  Pertinent labs & imaging results that were available during my care of the patient were reviewed by me and considered in my medical decision making (see chart for details).    MDM Rules/Calculators/A&P  Pt here with recurrent confusion and hallucinations.  Started on cephalexin two days ago for possible UTI.  Continues to have auditory hallucinations.  Previous urine culture showed multiple species. Advised to come to ER for further evaluation and CT head.  Pt denies symptoms.    Pt noted to be hypertensive.  She took metoprolol earlier today and suppose to take amlodipine, but a family member did not want her taking it due to possible side effects. Will give hydralazine here and observe.    CT head, CXR reassuring.  Labs w/o significant findings.  Urine here w/o obvious infection, culture pending.     2345 rechecked BP again, pt remains hypertensive but improved, now 177/83.  I have offered admission, but pt prefers to go home and keep her PCP appt for this Thursday.  I have discussed importance of taking her BP medications daily and she agrees to restart her amlodipine tomorrow.  Strict return precautions discussed.     Final Clinical Impression(s) / ED Diagnoses Final diagnoses:  Hypertensive urgency  Hallucinations    Rx / DC Orders ED Discharge Orders    None       Kem Parkinson, PA-C 05/31/20 0016    Carmin Muskrat, MD 05/31/20 2159

## 2020-05-30 NOTE — Telephone Encounter (Signed)
I ordered an MRI of the brain, much better at detecting the sources of cnfusion, etc.

## 2020-05-31 ENCOUNTER — Telehealth: Payer: Self-pay | Admitting: Family Medicine

## 2020-05-31 NOTE — Telephone Encounter (Signed)
Pt aware of provider feedback and aware to keep follow up appt with Dr Livia Snellen 06/02/20 at 2:10.

## 2020-05-31 NOTE — Discharge Instructions (Addendum)
Your blood pressure this evening was very high.  Some of your confusion may be related to your blood pressure being too high.  It is important that you take both of your prescribed blood pressure medications daily.  Keep your appt with your doctor this week to have your blood pressure rechecked.  Return here for any worsening symptoms

## 2020-06-01 ENCOUNTER — Other Ambulatory Visit: Payer: Self-pay

## 2020-06-01 ENCOUNTER — Ambulatory Visit (HOSPITAL_COMMUNITY)
Admission: RE | Admit: 2020-06-01 | Discharge: 2020-06-01 | Disposition: A | Discharge status: Home / Self Care | Payer: Medicare Other | Source: Ambulatory Visit | Attending: Family Medicine | Admitting: Family Medicine

## 2020-06-01 DIAGNOSIS — R410 Disorientation, unspecified: Secondary | ICD-10-CM | POA: Diagnosis not present

## 2020-06-01 DIAGNOSIS — I6782 Cerebral ischemia: Secondary | ICD-10-CM | POA: Diagnosis not present

## 2020-06-01 DIAGNOSIS — R609 Edema, unspecified: Secondary | ICD-10-CM | POA: Diagnosis not present

## 2020-06-01 DIAGNOSIS — R5381 Other malaise: Secondary | ICD-10-CM | POA: Diagnosis not present

## 2020-06-01 DIAGNOSIS — G319 Degenerative disease of nervous system, unspecified: Secondary | ICD-10-CM | POA: Diagnosis not present

## 2020-06-01 DIAGNOSIS — R443 Hallucinations, unspecified: Secondary | ICD-10-CM | POA: Diagnosis not present

## 2020-06-01 LAB — URINE CULTURE: Culture: NO GROWTH

## 2020-06-01 IMAGING — MR MR HEAD WO/W CM
13 of 15 series · 36 of 48 positions shown · IV contrast (gadavist)
Comparison: CT head [DATE].

CLINICAL DATA: Hallucinations.

EXAM:
MRI HEAD WITHOUT AND WITH CONTRAST
TECHNIQUE: Multiplanar, multiecho pulse sequences of the brain and surrounding
structures were obtained without and with intravenous contrast.
CONTRAST:  6mL GADAVIST GADOBUTROL 1 MMOL/ML IV SOLN

[Series 5: DWI · axial · 4.0mm · 0.88mm/px · z∈[-124,+15]mm · 3 of 36 slices shown (1 of 6)]
[im 1/36]
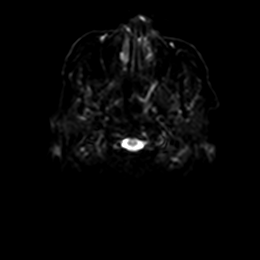
[im 18/36]
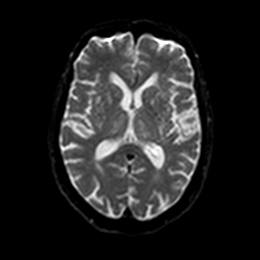
[im 36/36]
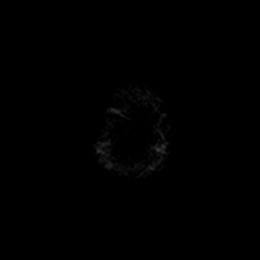

[Series 5: DWI · axial · 4.0mm · 0.88mm/px · z∈[-124,+15]mm · 3 of 36 slices shown (2 of 6)]
[im 1/36]
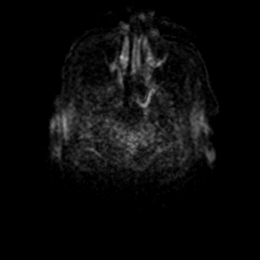
[im 18/36]
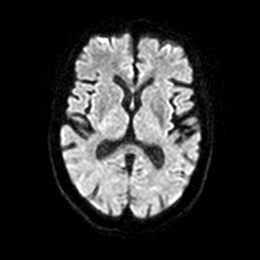
[im 36/36]
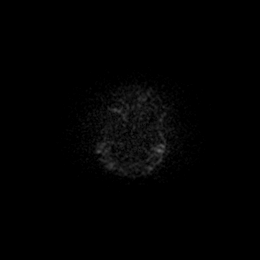

[Series 6: DWI · axial · 4.0mm · 0.88mm/px · z∈[-124,+15]mm · 3 of 36 slices shown (3 of 6)]
[im 1/36]
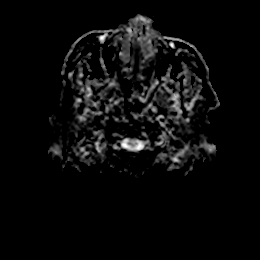
[im 18/36]
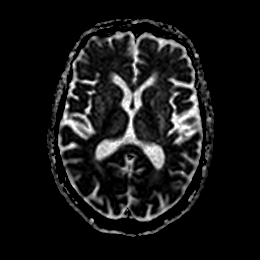
[im 36/36]
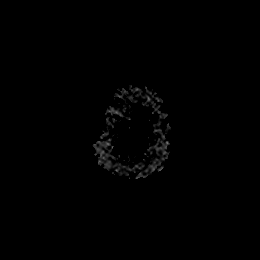

[Series 7: DWI · coronal · 5.0mm · 0.88mm/px · 3 of 28 slices shown (4 of 6)]
[im 1/28]
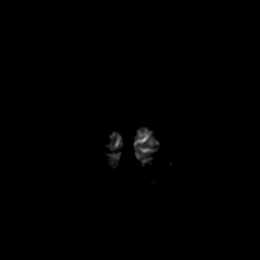
[im 14/28]
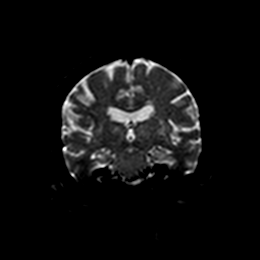
[im 28/28]
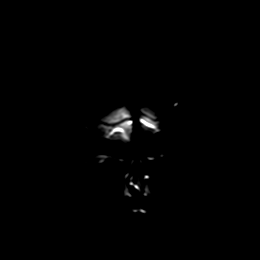

[Series 7: DWI · coronal · 5.0mm · 0.88mm/px · 3 of 28 slices shown (5 of 6)]
[im 1/28]
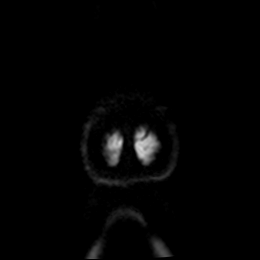
[im 14/28]
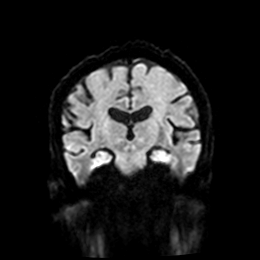
[im 28/28]
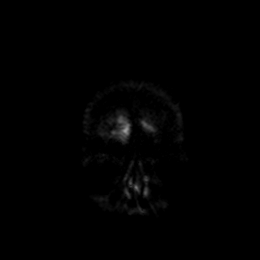

[Series 8: DWI · coronal · 5.0mm · 0.88mm/px · 3 of 27 slices shown (6 of 6)]
[im 1/27]
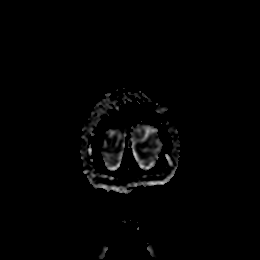
[im 14/27]
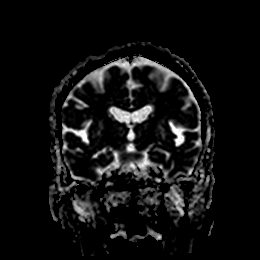
[im 27/27]
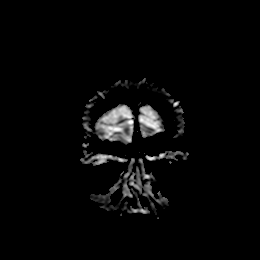

[Series 9: T1 · sagittal · 5.0mm · 0.94mm/px · 2 of 19 slices shown (1 of 2)]
[im 1/19]
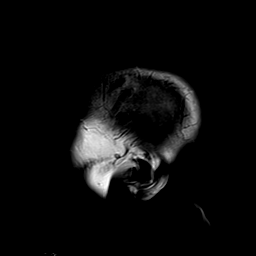
[im 19/19]
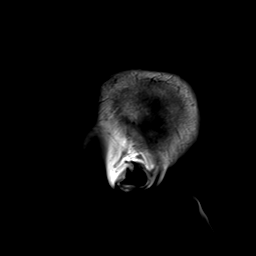

[Series 10: T2 · axial · 5.0mm · 0.72mm/px · z∈[-120,+11]mm · 2 of 20 slices shown (1 of 2)]
[im 1/20]
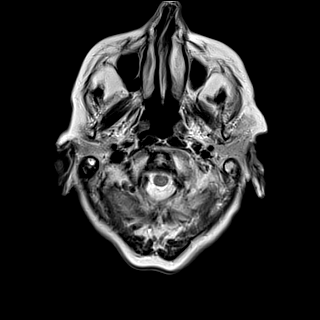
[im 20/20]
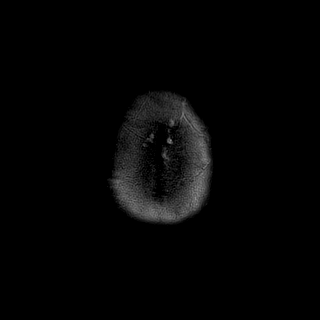

[Series 11: ax hemo · axial · 5.0mm · 0.86mm/px · z∈[-126,+16]mm · 3 of 25 slices shown]
[im 1/25]
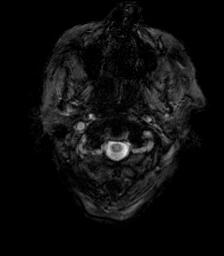
[im 13/25]
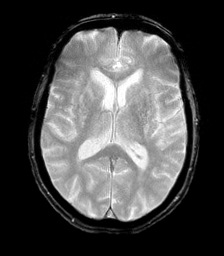
[im 25/25]
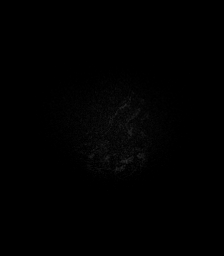

[Series 12: FLAIR · axial · 4.0mm · 0.43mm/px · z∈[-117,+6]mm · 3 of 32 slices shown]
[im 1/32]
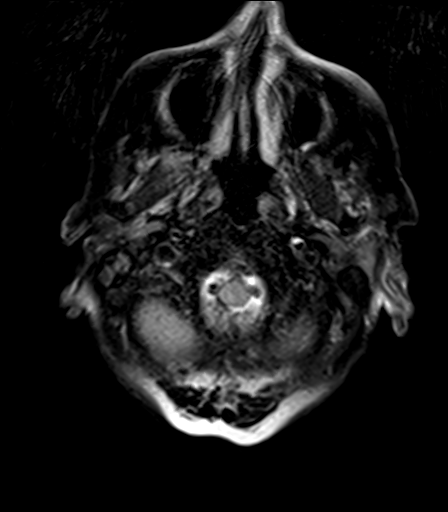
[im 16/32]
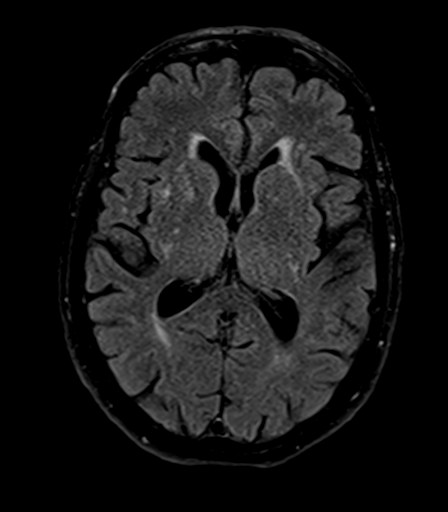
[im 32/32]
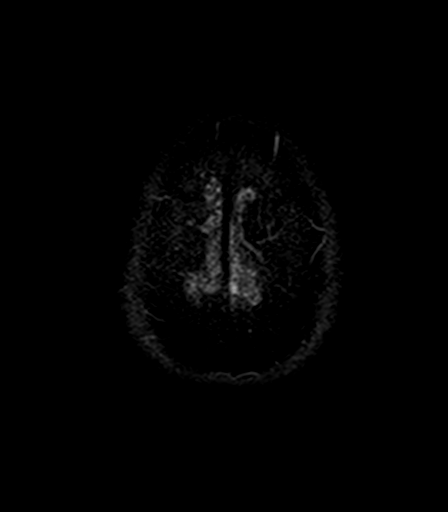

[Series 18: T2 · coronal · 5.0mm · 0.72mm/px · 3 of 26 slices shown (2 of 2)]
[im 1/26]
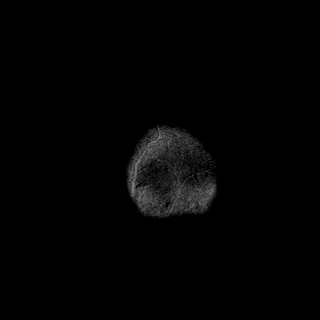
[im 13/26]
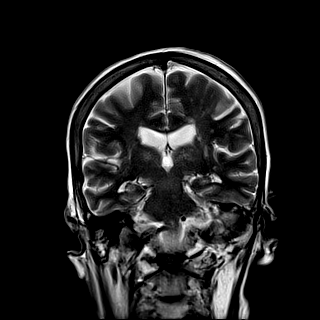
[im 26/26]
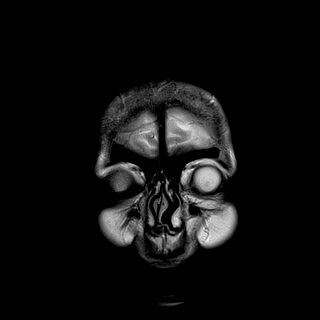

[Series 24: T1 post-contrast · coronal · 5.0mm · 0.34mm/px · 3 of 27 slices shown]
[im 1/27]
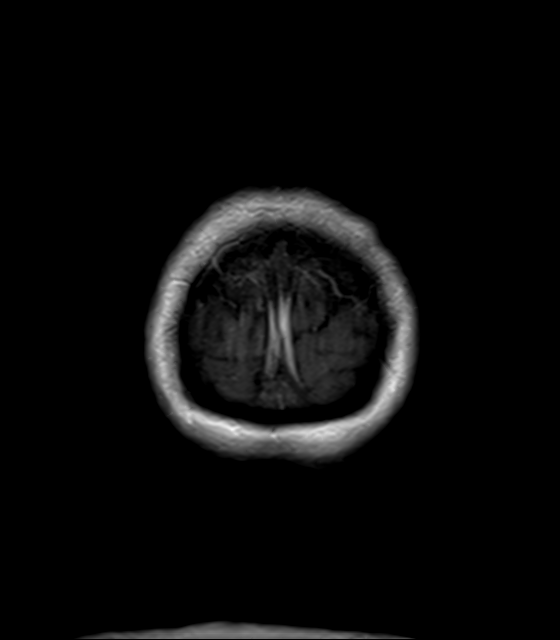
[im 14/27]
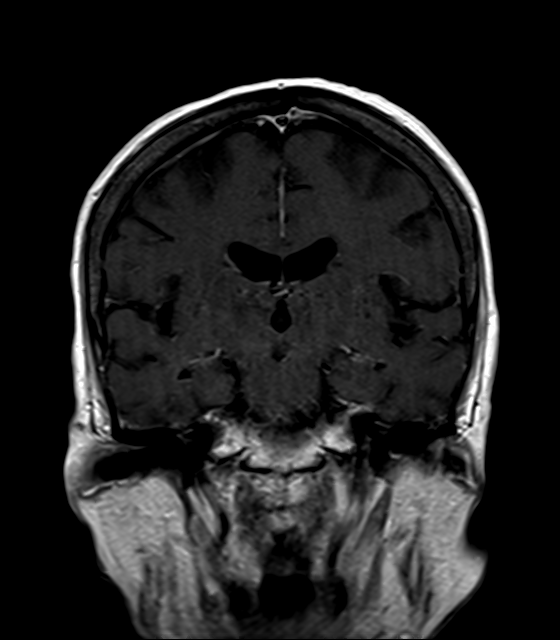
[im 27/27]
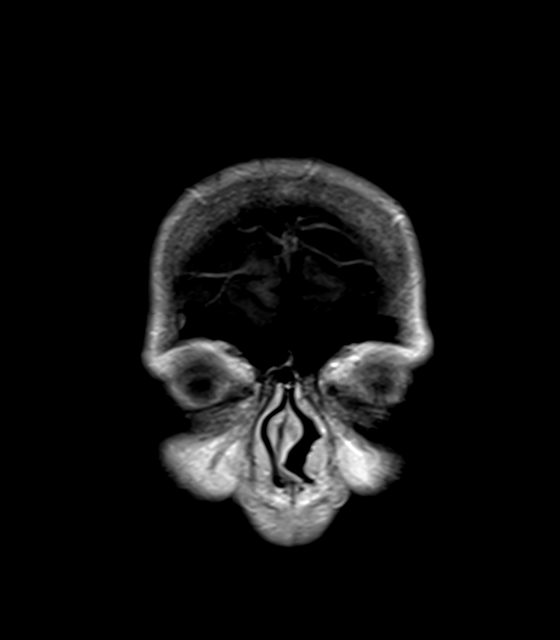

[Series 25: T1 · sagittal · 5.0mm · 0.94mm/px · 2 of 19 slices shown (2 of 2)]
[im 1/19]
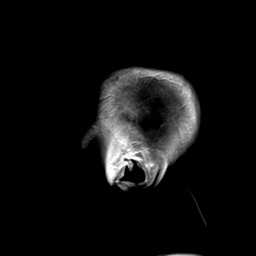
[im 19/19]
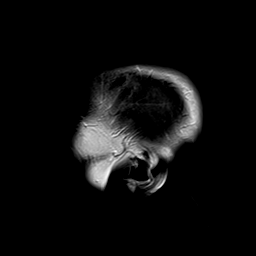

[36 of 48 positions shown; findings below may reference images not displayed]

FINDINGS: Brain: No acute infarction, hemorrhage, hydrocephalus, extra-axial
collection or mass lesion. Patchy T2/FLAIR hyperintensities within
the white matter, compatible with chronic microvascular ischemic
disease. No abnormal enhancement. Generalized cerebral atrophy.

Vascular: Major arterial flow voids are maintained at the skull
base.

Skull and upper cervical spine: Normal marrow signal.

Sinuses/Orbits: Negative.

Other: Motion limited.
IMPRESSION: 1. No evidence of acute intracranial abnormality.
2. Chronic microvascular ischemic disease and generalized cerebral
atrophy.

## 2020-06-01 MED ORDER — GADOBUTROL 1 MMOL/ML IV SOLN
6.0000 mL | Freq: Once | INTRAVENOUS | Status: AC | PRN
  Administered 2020-06-01: 6 mL via INTRAVENOUS

## 2020-06-02 ENCOUNTER — Encounter: Payer: Self-pay | Admitting: Family Medicine

## 2020-06-02 ENCOUNTER — Ambulatory Visit (INDEPENDENT_AMBULATORY_CARE_PROVIDER_SITE_OTHER): Payer: Medicare Other | Admitting: Family Medicine

## 2020-06-02 VITALS — BP 139/75 | HR 71 | Temp 97.4°F | Ht 61.0 in | Wt 127.6 lb

## 2020-06-02 DIAGNOSIS — Z23 Encounter for immunization: Secondary | ICD-10-CM

## 2020-06-02 DIAGNOSIS — N390 Urinary tract infection, site not specified: Secondary | ICD-10-CM

## 2020-06-02 DIAGNOSIS — I10 Essential (primary) hypertension: Secondary | ICD-10-CM

## 2020-06-02 NOTE — Progress Notes (Signed)
Subjective:  Patient ID: Crystal Browning, female    DOB: March 11, 1934  Age: 86 y.o. MRN: [id number]  CC: Follow-up (Urgent Care- UTI)   HPI ADRIEL DESROSIER presents for follow-up of hypertension. Patient has no history of headache chest pain or shortness of breath or recent cough. Patient also denies symptoms of TIA such as numbness weakness lateralizing.  However she did have some confusion and went to the urgent care a few days ago and had a CT scan of her head which was negative.  She also had an MRI for similar reasons and this showed that she had some age-related atrophy.  She did have a positive urinalysis.  The UTI was the reason for her confusion.  Patient checks  blood pressure at home and has not had any elevated readings recently. Patient denies side effects from his medication. States taking it regularly.   Depression screen [facility] 2/9 06/02/2020 03/30/2020 09/29/2019  Decreased Interest 0 0 0  Down, Depressed, Hopeless 0 0 0  PHQ - 2 Score 0 0 0  Altered sleeping - - -  Tired, decreased energy - - -  Change in appetite - - -  Feeling bad or failure about yourself  - - -  Trouble concentrating - - -  Moving slowly or fidgety/restless - - -  Suicidal thoughts - - -  PHQ-9 Score - - -  Difficult doing work/chores - - -    History Glenis has a past medical history of Bilateral lower extremity edema (10/30/2016), Cancer ([place]) (03/28/11), Depression, Diverticulosis (02/2005), Esophageal stricture (03/03/2018), Generalized abdominal pain (07/17/2015), History of chemotherapy, Hypertension, Hypothyroid, Osteoporosis, Radiation, and TIA (transient ischemic attack).   She has a past surgical history that includes Direct laryngoscopy (03/27/2005); Tubal ligation; Total abdominal hysterectomy w/ bilateral salpingoophorectomy (1986); Cataract extraction, bilateral; and Cardioversion (N/A, 11/23/2016).   Her family history includes Alcohol abuse in her brother; COPD in her brother; Cancer (age of onset:  56) in her father; Heart disease (age of onset: 48) in her mother; Stroke in her mother.She reports that she has never smoked. She has never used smokeless tobacco. She reports that she does not drink alcohol and does not use drugs.    ROS Review of Systems  Constitutional: Negative for fever.  HENT: Negative for congestion, rhinorrhea and sore throat.   Respiratory: Negative for cough and shortness of breath.   Cardiovascular: Negative for chest pain and palpitations.  Gastrointestinal: Negative for abdominal pain.  Musculoskeletal: Negative for arthralgias and myalgias.    Objective:  BP 139/75   Pulse 71   Temp (!) 97.4 F (36.3 C) (Temporal)   Ht 5\' 1"  (1.549 m)   Wt 127 lb 9.6 oz (57.9 kg)   BMI 24.11 kg/m   BP Readings from Last 3 Encounters:  06/02/20 139/75  05/31/20 (!) 164/86  05/28/20 (!) 159/69    Wt Readings from Last 3 Encounters:  06/02/20 127 lb 9.6 oz (57.9 kg)  05/30/20 124 lb (56.2 kg)  03/30/20 127 lb (57.6 kg)     Physical Exam Constitutional:      General: She is not in acute distress.    Appearance: Normal appearance. She is well-developed. She is ill-appearing.  HENT:     Head: Normocephalic and atraumatic.  Eyes:     Conjunctiva/sclera: Conjunctivae normal.     Pupils: Pupils are equal, round, and reactive to light.  Neck:     Thyroid: No thyromegaly.  Cardiovascular:     Rate and  Rhythm: Normal rate and regular rhythm.     Heart sounds: Normal heart sounds. No murmur heard.   Pulmonary:     Effort: Pulmonary effort is normal. No respiratory distress.     Breath sounds: Normal breath sounds. No wheezing or rales.  Abdominal:     General: Bowel sounds are normal. There is no distension.     Palpations: Abdomen is soft.     Tenderness: There is no abdominal tenderness.  Musculoskeletal:        General: Normal range of motion.     Cervical back: Normal range of motion and neck supple.  Lymphadenopathy:     Cervical: No cervical  adenopathy.  Skin:    General: Skin is warm and dry.  Neurological:     Mental Status: She is alert and oriented to person, place, and time.  Psychiatric:        Mood and Affect: Mood normal.        Behavior: Behavior normal.       Assessment & Plan:   Eleonor was seen today for follow-up.  Diagnoses and all orders for this visit:  Essential hypertension  Frequent UTI -     Urinalysis, Complete -     Urine Culture; Future  Need for immunization against influenza -     Flu Vaccine QUAD High Dose(Fluad)       I am having Madisun B. Madding maintain her B Complex-Folic Acid (B COMPLEX-VITAMIN B12 PO), Vitamin D (Ergocalciferol), acetaminophen, polyethylene glycol powder, metoprolol succinate, amLODipine, escitalopram, famotidine, levothyroxine, Eliquis, cephALEXin, phenazopyridine, fluconazole, and diphenhydrAMINE. We will continue to administer sodium chloride.  Allergies as of 06/02/2020      Reactions   Tuberculin Tests Swelling   Vit D-vit E-safflower Oil    Paroxetine Hcl Rash      Medication List       Accurate as of June 02, 2020 11:59 PM. If you have any questions, ask your nurse or doctor.        acetaminophen 325 MG tablet Commonly known as: TYLENOL Take 2 tablets (650 mg total) by mouth every 6 (six) hours as needed for mild pain, fever or headache.   amLODipine 2.5 MG tablet Commonly known as: NORVASC Take 1 tablet (2.5 mg total) by mouth daily.   B COMPLEX-VITAMIN B12 PO Take by mouth.   cephALEXin 500 MG capsule Commonly known as: KEFLEX Take 1 capsule (500 mg total) by mouth 2 (two) times daily for 10 days.   diphenhydrAMINE 12.5 MG chewable tablet Commonly known as: BENADRYL Chew 25 mg by mouth at bedtime as needed for allergies.   Eliquis 5 MG Tabs tablet Generic drug: apixaban TAKE 1 TABLET BY MOUTH TWICE A DAY   escitalopram 10 MG tablet Commonly known as: LEXAPRO Take 1 tablet (10 mg total) by mouth daily.   famotidine 20 MG  tablet Commonly known as: PEPCID Take 1 tablet (20 mg total) by mouth every 12 (twelve) hours.   fluconazole 150 MG tablet Commonly known as: DIFLUCAN Take 150 mg by mouth once.   levothyroxine 25 MCG tablet Commonly known as: SYNTHROID Take 1 tablet (25 mcg total) by mouth daily.   metoprolol succinate 100 MG 24 hr tablet Commonly known as: TOPROL-XL Take with or immediately following a meal.   phenazopyridine 200 MG tablet Commonly known as: PYRIDIUM Take 200 mg by mouth 3 (three) times daily.   polyethylene glycol powder 17 GM/SCOOP powder Commonly known as: GLYCOLAX/MIRALAX Take 17 g by mouth 2 (  two) times daily. For constipation   Vitamin D (Ergocalciferol) 1.25 MG (50000 UNIT) Caps capsule Commonly known as: DRISDOL Take 1 capsule (50,000 Units total) by mouth every 7 (seven) days.        Follow-up: Return in about 3 months (around 09/02/2020), or if symptoms worsen or fail to improve.  [name], M.D.

## 2020-06-06 ENCOUNTER — Encounter: Payer: Self-pay | Admitting: Family Medicine

## 2020-06-10 ENCOUNTER — Other Ambulatory Visit: Payer: Self-pay | Admitting: Family Medicine

## 2020-06-10 DIAGNOSIS — I4891 Unspecified atrial fibrillation: Secondary | ICD-10-CM

## 2020-06-14 ENCOUNTER — Other Ambulatory Visit: Payer: Self-pay

## 2020-06-14 ENCOUNTER — Encounter: Payer: Self-pay | Admitting: Urology

## 2020-06-14 ENCOUNTER — Ambulatory Visit (INDEPENDENT_AMBULATORY_CARE_PROVIDER_SITE_OTHER): Payer: Medicare Other | Admitting: Urology

## 2020-06-14 VITALS — BP 155/70 | HR 62 | Temp 98.0°F | Ht 61.0 in | Wt 124.0 lb

## 2020-06-14 DIAGNOSIS — N390 Urinary tract infection, site not specified: Secondary | ICD-10-CM | POA: Diagnosis not present

## 2020-06-14 LAB — URINALYSIS, ROUTINE W REFLEX MICROSCOPIC
Bilirubin, UA: NEGATIVE
Glucose, UA: NEGATIVE
Ketones, UA: NEGATIVE
Nitrite, UA: NEGATIVE
Protein,UA: NEGATIVE
Specific Gravity, UA: <1.005 — ABNORMAL LOW (ref 1.005–1.030)
Urobilinogen, Ur: 0.2 mg/dL (ref 0.2–1.0)
pH, UA: 5 (ref 5.0–7.5)

## 2020-06-14 LAB — MICROSCOPIC EXAMINATION: Renal Epithel, UA: NONE SEEN /hpf

## 2020-06-14 NOTE — Progress Notes (Signed)
H&P  Chief Complaint: Recurrent UTIs  History of Present Illness: Crystal Browning is a 86 y.o. year old female new patient here for referral of her frequent UTIs.  11.30.2021: Pt advocate believes that pt has been experiencing frequent UTIs (she notes 4 recurrent UTIs) accompanied by auditory/visual hallucinations (last episode 06/12/2020). Her advocate reports that her symptoms seem to improve with abx. She has been experiencing dysuria and pruritis but denies any gross hematuria. Pt reports a good FOS and she feels that she is able to empty her bladder completely. She experiences daily incontinence (in the am).  The patient does apparently have some cognitive dysfunction.  Past Medical History:  Diagnosis Date  . Bilateral lower extremity edema 10/30/2016  . Cancer ([place]) 03/28/11   SCCA of the Supraglottic Larynx (T2NoMo)    ; S/P Chemoradiation therapy from 05/09/05 thru 06/29/05  . Depression    History of Depression- Lexapro therapy  . Diverticulosis 02/2005   History of diverticulosis with admission from 8/18 - 03/04/2005  . Esophageal stricture 03/03/2018  . Generalized abdominal pain 07/17/2015  . History of chemotherapy    S/P chemotherapy with Dr. Sonny Dandy -2006  . Hypertension   . Hypothyroid   . Osteoporosis    History of Osteoporosis with one year of Actonel only  . Radiation    S/P radiation thrapy -06/2005  . TIA (transient ischemic attack)    History of TIA's with no residual effect    Past Surgical History:  Procedure Laterality Date  . CARDIOVERSION N/A 11/23/2016   Procedure: CARDIOVERSION;  Surgeon: Pixie Casino, MD;  Location: [facility] ENDOSCOPY;  Service: Cardiovascular;  Laterality: N/A;  . CATARACT EXTRACTION, BILATERAL    . DIRECT LARYNGOSCOPY  03/27/2005   S/P direct laryngoscopy, esophagoscopy and biopsy with Dr. Constance Holster  . TOTAL ABDOMINAL HYSTERECTOMY W/ BILATERAL SALPINGOOPHORECTOMY  1986  . TUBAL LIGATION      Home Medications:  (Not in a hospital  admission)   Allergies:  Allergies  Allergen Reactions  . Tuberculin Tests Swelling  . Vit D-Vit E-Safflower Oil   . Paroxetine Hcl Rash    Family History  Problem Relation Age of Onset  . Stroke Mother   . Heart disease Mother 75  . Cancer Father 85       Prostate  . Alcohol abuse Brother   . COPD Brother     Social History:  reports that she has never smoked. She has never used smokeless tobacco. She reports that she does not drink alcohol and does not use drugs.  ROS: A complete review of systems was performed.  All systems are negative except for pertinent findings as noted.  Physical Exam:  Vital signs in last 24 hours:   General:  Alert and oriented, No acute distress HEENT: Normocephalic, atraumatic Neck: No JVD  Extremities: No edema Neurologic: Grossly intact  I have reviewed prior pt notes  I have reviewed notes from referring/previous physicians  I have reviewed urinalysis results--overall, urinalyses do not show worry for infection.  I have reviewed prior urine cultures--there are no significant urologic pathogens in any of the past 3-4 cultures. Impression/Assessment:  No UTIs - Pt records reviewed and cultures/UAs have been negative. Her psychological symptoms may well be due to a non-urologic process.    Plan:  1. Pt advised regarding replens/luvena  application for pruritis/vaginal irritation.  2. Pt reassured regarding her urologic status.  I do not think her symptoms are caused by infections which have not been present  3. F/U PRN  Budd Palmer 06/14/2020, 12:27 PM  Lillette Boxer. Mayley Lish MD

## 2020-06-14 NOTE — Progress Notes (Signed)
Urological Symptom Review  Patient is experiencing the following symptoms: Leakage of urine   Review of Systems  Gastrointestinal (upper)  : Negative for upper GI symptoms  Gastrointestinal (lower) : Negative for lower GI symptoms  Constitutional : Negative for symptoms  Skin: Skin rash/lesion Itching  Eyes: Negative for eye symptoms  Ear/Nose/Throat : Negative for Ear/Nose/Throat symptoms  Hematologic/Lymphatic: Negative for Hematologic/Lymphatic symptoms  Cardiovascular : Negative for cardiovascular symptoms  Respiratory : Negative for respiratory symptoms  Endocrine: Negative for endocrine symptoms  Musculoskeletal: Negative for musculoskeletal symptoms  Neurological: Negative for neurological symptoms  Psychologic: Negative for psychiatric symptoms

## 2020-06-20 ENCOUNTER — Encounter: Payer: Self-pay | Admitting: Internal Medicine

## 2020-06-20 ENCOUNTER — Ambulatory Visit (INDEPENDENT_AMBULATORY_CARE_PROVIDER_SITE_OTHER): Payer: Medicare Other | Admitting: Internal Medicine

## 2020-06-20 ENCOUNTER — Other Ambulatory Visit: Payer: Self-pay

## 2020-06-20 VITALS — BP 174/68 | HR 78 | Ht 61.0 in | Wt 126.8 lb

## 2020-06-20 DIAGNOSIS — I4891 Unspecified atrial fibrillation: Secondary | ICD-10-CM | POA: Diagnosis not present

## 2020-06-20 DIAGNOSIS — I10 Essential (primary) hypertension: Secondary | ICD-10-CM | POA: Diagnosis not present

## 2020-06-20 DIAGNOSIS — R410 Disorientation, unspecified: Secondary | ICD-10-CM

## 2020-06-20 NOTE — Progress Notes (Signed)
OFFICE CONSULT NOTE  Chief Complaint:  No complaints  Primary Care Physician: [name], MD  HPI:  Crystal Browning is a 86 y.o. female who is being seen today for the evaluation of atrial fibrillation at the request of [name], MD. Crystal Browning was recently seen for symptoms of palpitations and shortness of breath which was mostly noted by her daughter. She said that they routinely go shopping at [facility] and that while walking through the building she became more short of breath easier. It does not seem that Crystal Browning typically complaints about any worsening shortness of breath or symptoms but it was clear to her daughter that she was struggling somewhat to get around. She noted that her pulse was irregular and when she was seen in her primary care doctor's office she was found to be in atrial fibrillation. There is no history of A. fib in the family or personal history although she did have possibly a mild MI after the death of her husband, which sounds like it could've been a stress-induced cardiomyopathic event. She was a ready on metoprolol succinate 50 mg daily and that was increased up to 100 mg daily for rate control and started on Xarelto. Unfortunate she was given the Xarelto DBT dose pack which is 50 mg twice a day and then switching to 20 mg daily. The appropriate dose for her would've been 20 mg daily from the beginning. So far she has not had any bleeding problems. She is also on low-dose aspirin. There is apparently history of diverticulum.  12/14/2016  Crystal Browning returns today for follow-up of her cardioversion. This is performed by myself on 11/23/2016. She was cardioverted once at 150 J successfully to sinus rhythm. Per her daughter's report she had a partial choking event which was related to x-ray therapy in the past 2 days after her cardioversion. EMS was called and she was noted to be in sinus rhythm at the time. She says she's felt very good since then. She continues to feel  well today. Unfortunately, her EKG today shows she is back in atrial fibrillation although rate controlled in the 70s. Based on this finding I think that she is actually somewhat asymptomatic with the A. fib and therefore I would not recommend pursuing a rhythm control strategy in her. We also discussed anticoagulation. Her daughter's concerned about possibly higher bleeding risk on Xarelto and is interested in switching her to Eliquis.  06/14/2017  Crystal Browning returns today for follow-up.  She is done well without any recurrent A. fib.  She occasionally gets some dizziness but I do not feel that this is the A. fib.  EKG shows sinus rhythm today.  She denies any bleeding problems on Eliquis.  Recently labs were done in August by her primary care provider which showed normal renal function and stable hemoglobin and hematocrit.  Blood pressure is at goal today.  06/24/2018  Crystal Browning is seen today in follow-up.  She seems to be back in A. fib today.  Apparently recently she was seen in her PCPs office and was in sinus rhythm.  This suggests her A. fib is paroxysmal.  She is tolerating Eliquis without any bleeding problems.  Her blood pressure is noted to be high today at 180/84 however recheck came down to 563 systolic.  Her PCP has noted labile blood pressures in the past and apparently she was placed on some additional blood pressure medicine which "bottomed her out" according to her daughter.  Labs from June 11, 2018 showed total cholesterol 166, HDL 41, LDL 97 and triglycerides 141.  07/01/2019  Crystal Browning returns today in follow-up.  She is accompanied by her daughter.  Blood pressure is again elevated in the office today however at home she is certain it runs around 782 systolic.  She denies any chest pain or worsening shortness of breath.  She is back in A. fib today with controlled ventricular response at 74, but is asymptomatic with this.  She is anticoagulated on Eliquis without bleeding  issues.  06/20/2020  Crystal Browning is seen today in follow-up. Her daughter is with her again today. Her PCP apparently started her back on some low-dose amlodipine. Her daughter was concerned because she had side effects with this including hives and has had recent issues with memory that seem to be worse. There is a suggestion of labile hypertension and at times her blood pressure was low at home. Today the blood pressure is high in the office at 174/68. She is in A. fib today but rate controlled. No difficulties with anticoagulation has been reported. She denies any falls. She had labs in September showing total cholesterol 166, HDL 54, LDL 89 and triglycerides 131.  PMHx:  Past Medical History:  Diagnosis Date  . Arthritis   . Bilateral lower extremity edema 10/30/2016  . Cancer ([place]) 03/28/11   SCCA of the Supraglottic Larynx (T2NoMo)    ; S/P Chemoradiation therapy from 05/09/05 thru 06/29/05  . Depression    History of Depression- Lexapro therapy  . Diverticulosis 02/2005   History of diverticulosis with admission from 8/18 - 03/04/2005  . Esophageal stricture 03/03/2018  . Generalized abdominal pain 07/17/2015  . History of chemotherapy    S/P chemotherapy with Dr. Sonny Dandy -2006  . Hypertension   . Hypothyroid   . Osteoporosis    History of Osteoporosis with one year of Actonel only  . Radiation    S/P radiation thrapy -06/2005  . TIA (transient ischemic attack)    History of TIA's with no residual effect    Past Surgical History:  Procedure Laterality Date  . CARDIOVERSION N/A 11/23/2016   Procedure: CARDIOVERSION;  Surgeon: Pixie Casino, MD;  Location: [facility] ENDOSCOPY;  Service: Cardiovascular;  Laterality: N/A;  . CATARACT EXTRACTION, BILATERAL    . DIRECT LARYNGOSCOPY  03/27/2005   S/P direct laryngoscopy, esophagoscopy and biopsy with Dr. Constance Holster  . TOTAL ABDOMINAL HYSTERECTOMY W/ BILATERAL SALPINGOOPHORECTOMY  1986  . TUBAL LIGATION      FAMHx:  Family History  Problem  Relation Age of Onset  . Stroke Mother   . Heart disease Mother 59  . Cancer Father 32       Prostate  . Alcohol abuse Brother   . COPD Brother     SOCHx:   reports that she has never smoked. She has never used smokeless tobacco. She reports that she does not drink alcohol and does not use drugs.  ALLERGIES:  Allergies  Allergen Reactions  . Amlodipine Besylate Hives    Hives and confusion.  . Tuberculin Tests Swelling  . Vit D-Vit E-Safflower Oil   . Paroxetine Hcl Rash    ROS: Pertinent items noted in HPI and remainder of comprehensive ROS otherwise negative.  HOME MEDS: Current Outpatient Medications on File Prior to Visit  Medication Sig Dispense Refill  . acetaminophen (TYLENOL) 325 MG tablet Take 2 tablets (650 mg total) by mouth every 6 (six) hours as needed for mild pain, fever or  headache. 12 tablet 0  . amLODipine (NORVASC) 2.5 MG tablet Take 1 tablet (2.5 mg total) by mouth daily. 90 tablet 1  . B Complex-Folic Acid (B COMPLEX-VITAMIN B12 PO) Take by mouth.     . diphenhydrAMINE (BENADRYL) 12.5 MG chewable tablet Chew 25 mg by mouth at bedtime as needed for allergies.    Marland Kitchen ELIQUIS 5 MG TABS tablet TAKE 1 TABLET BY MOUTH TWICE A DAY 60 tablet 0  . escitalopram (LEXAPRO) 10 MG tablet Take 1 tablet (10 mg total) by mouth daily. 90 tablet 1  . famotidine (PEPCID) 20 MG tablet Take 1 tablet (20 mg total) by mouth every 12 (twelve) hours. 180 tablet 1  . levothyroxine (SYNTHROID) 25 MCG tablet Take 1 tablet (25 mcg total) by mouth daily. 90 tablet 1  . metoprolol succinate (TOPROL-XL) 100 MG 24 hr tablet Take with or immediately following a meal. 90 tablet 1  . polyethylene glycol powder (GLYCOLAX/MIRALAX) 17 GM/SCOOP powder Take 17 g by mouth 2 (two) times daily. For constipation 850 g 3   Current Facility-Administered Medications on File Prior to Visit  Medication Dose Route Frequency Provider Last Rate Last Admin  . 0.9 %  sodium chloride infusion  500 mL Intravenous  Once Irene Shipper, MD        LABS/IMAGING: No results found for this or any previous visit (from the past 48 hour(s)). No results found.  WEIGHTS: Wt Readings from Last 3 Encounters:  06/20/20 126 lb 12.8 oz (57.5 kg)  06/14/20 124 lb (56.2 kg)  06/02/20 127 lb 9.6 oz (57.9 kg)    VITALS: BP (!) 174/68   Pulse 78   Ht 5\' 1"  (1.549 m)   Wt 126 lb 12.8 oz (57.5 kg)   BMI 23.96 kg/m   EXAM: General appearance: alert and no distress Neck: no carotid bruit and no JVD Lungs: clear to auscultation bilaterally Heart: regular rate and rhythm, S1, S2 normal, no murmur, click, rub or gallop Abdomen: soft, non-tender; bowel sounds normal; no masses,  no organomegaly Extremities: extremities normal, atraumatic, no cyanosis or edema and varicose veins noted Pulses: 2+ and symmetric Skin: Skin color, texture, turgor normal. No rashes or lesions Neurologic: Grossly normal Psych: Pleasant  EKG: A. fib at 78, bifascicular block, moderate voltage criteria for LVH-personally reviewed  ASSESSMENT: 1. Persistent atrial fibrillation-successful DCCV, however there was recurrence 2. CHADSVASC score of 4-on Eliquis 3. Hypertension 4. Venous insufficiency  PLAN: 1.   Mrs. Segar seems to have some labile hypertension with reports of low blood pressures at home. Apparently since restarting low-dose amlodipine she has had issues with memory loss as well as confusion. Blood pressure is more often low at home according to her daughter. She would like to discontinue it. She is also had some recurrent hives. I recommended she stop the amlodipine. She should have follow-up with her PCP. Would monitor blood pressure closely and if necessary maintain a little higher than normal blood pressure to avoid hypotension. Continue current medications for rate control and Eliquis for anticoagulation related to A. fib.  Follow-up annually or sooner as necessary.  Pixie Casino, MD, [facility], [place] Director of the [facility] Attending Cardiologist  Direct Dial: [phone]  Fax: [phone]  Website:  [email or web address] 06/20/2020, 3:18 PM

## 2020-06-20 NOTE — Patient Instructions (Signed)
Medication Instructions:  STOP amlodipine CONTINUE all other current medications  *If you need a refill on your cardiac medications before your next appointment, please call your pharmacy*   Follow-Up: At [facility], you and your health needs are our priority.  As part of our continuing mission to provide you with exceptional heart care, we have created designated Provider Care Teams.  These Care Teams include your primary Cardiologist (physician) and Advanced Practice Providers (APPs -  Physician Assistants and Nurse Practitioners) who all work together to provide you with the care you need, when you need it.  We recommend signing up for the patient portal called "MyChart".  Sign up information is provided on this After Visit Summary.  MyChart is used to connect with patients for Virtual Visits (Telemedicine).  Patients are able to view lab/test results, encounter notes, upcoming appointments, etc.  Non-urgent messages can be sent to your provider as well.   To learn more about what you can do with MyChart, go to [email or web address].    Your next appointment:   12 month(s)  The format for your next appointment:   In Person  Provider:   You may see Dr. Debara Pickett or one of the following Advanced Practice Providers on your designated Care Team:    Almyra Deforest, PA-C  Fabian Sharp, [place] or   Roby Lofts, [place]    Other Instructions

## 2020-06-28 ENCOUNTER — Ambulatory Visit: Payer: Medicare Other | Admitting: Family Medicine

## 2020-06-29 ENCOUNTER — Ambulatory Visit (INDEPENDENT_AMBULATORY_CARE_PROVIDER_SITE_OTHER): Payer: Medicare Other | Admitting: Family Medicine

## 2020-06-29 VITALS — BP 117/71 | HR 76 | Temp 97.5°F

## 2020-06-29 DIAGNOSIS — R7989 Other specified abnormal findings of blood chemistry: Secondary | ICD-10-CM | POA: Diagnosis not present

## 2020-06-29 DIAGNOSIS — R413 Other amnesia: Secondary | ICD-10-CM | POA: Diagnosis not present

## 2020-06-29 DIAGNOSIS — R8271 Bacteriuria: Secondary | ICD-10-CM

## 2020-06-29 DIAGNOSIS — R443 Hallucinations, unspecified: Secondary | ICD-10-CM

## 2020-06-29 DIAGNOSIS — E559 Vitamin D deficiency, unspecified: Secondary | ICD-10-CM

## 2020-06-29 LAB — URINALYSIS, COMPLETE
Bilirubin, UA: NEGATIVE
Glucose, UA: NEGATIVE
Ketones, UA: NEGATIVE
Nitrite, UA: NEGATIVE
Protein,UA: NEGATIVE
RBC, UA: NEGATIVE
Specific Gravity, UA: 1.015 (ref 1.005–1.030)
Urobilinogen, Ur: 0.2 mg/dL (ref 0.2–1.0)
pH, UA: 6.5 (ref 5.0–7.5)

## 2020-06-29 LAB — MICROSCOPIC EXAMINATION

## 2020-06-29 NOTE — Progress Notes (Signed)
Telephone visit  Subjective: CC: hallucinations, hematuria PCP: Crystal Fraise, MD CBU:LAGTX B [name] calls for telephone consult today. Patient provides verbal consent for consult held via phone.  Due to COVID-19 pandemic this visit was conducted virtually. This visit type was conducted due to national recommendations for restrictions regarding the COVID-19 Pandemic (e.g. social distancing, sheltering in place) in an effort to limit this patient's exposure and mitigate transmission in our community. All issues noted in this document were discussed and addressed.  A physical exam was not performed with this format.   Location of patient: home Location of provider: [facility] Others present for call: granddaughter  1. ?UTI She reports that patient had visual hallucinations this morning and called the police.  She reports that her BP was 109/58.  Repeat was 117/71. Normal in the 120-130s.  HR normally in 50-60s.  She has not appreciated hematuria but patient was told she had hematuria but no true recurrent UTI.  She's had intermittent hallucinations throughout the year.  Highest level of education is 7th grade.  ROS: Per HPI  Allergies  Allergen Reactions   Amlodipine Besylate Hives    Hives and confusion.   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 Sonny Dandy -2006   Hypertension    Hypothyroid    Osteoporosis    History of  Osteoporosis with one year of Actonel only   Radiation    S/P radiation thrapy -06/2005   TIA (transient ischemic attack)    History of TIA's with no residual effect    Current Outpatient Medications:    acetaminophen (TYLENOL) 325 MG tablet, Take 2 tablets (650 mg total) by mouth every 6 (six) hours as needed for mild pain, fever or headache., Disp: 12 tablet, Rfl: 0   B Complex-Folic Acid (B COMPLEX-VITAMIN B12 PO), Take by mouth. , Disp: , Rfl:    diphenhydrAMINE (BENADRYL) 12.5 MG chewable tablet, Chew 25 mg by mouth at bedtime as needed for allergies., Disp: , Rfl:    ELIQUIS 5 MG TABS tablet, TAKE 1 TABLET BY MOUTH TWICE A DAY, Disp: 60 tablet, Rfl: 0   escitalopram (LEXAPRO) 10 MG tablet, Take 1 tablet (10 mg total) by mouth daily., Disp: 90 tablet, Rfl: 1   famotidine (PEPCID) 20 MG tablet, Take 1 tablet (20 mg total) by mouth every 12 (twelve) hours., Disp: 180 tablet, Rfl: 1   levothyroxine (SYNTHROID) 25 MCG tablet, Take 1 tablet (25 mcg total) by mouth daily., Disp: 90 tablet, Rfl: 1   metoprolol succinate (TOPROL-XL) 100 MG 24 hr tablet, Take with or immediately following a meal., Disp: 90 tablet, Rfl: 1   polyethylene glycol powder (GLYCOLAX/MIRALAX) 17 GM/SCOOP powder, Take 17 g by mouth 2 (two) times daily. For constipation, Disp: 850 g, Rfl: 3  MMSE - Mini Mental State Exam 06/29/2020  Orientation to time 4  Orientation to Place 4  Registration 3  Attention/  Calculation 2  Recall 3  Language- name 2 objects 2  Language- repeat 0  Language- follow 3 step command 3  Language- read & follow direction 1  Write a sentence 0  Copy design 0  Total score 22   Assessment/ Plan: 86 y.o. female   Hallucination - Plan: Urinalysis, Complete, CMP14+EGFR, Vitamin B12, CBC with Differential/Platelet, CBC with Differential/Platelet, Vitamin B12, CMP14+EGFR, Urinalysis, Complete  Memory change - Plan: Vitamin B12, Vitamin B12  Abnormal TSH - Plan: Thyroid Panel With  TSH, Thyroid Panel With TSH  Vitamin D deficiency - Plan: VITAMIN D 25 Hydroxy (Vit-D Deficiency, Fractures), VITAMIN D 25 Hydroxy (Vit-D Deficiency, Fractures)  Bacteriuria - Plan: Urine Culture   I have reviewed her urine results with her granddaughter and daughter.  I am going to send this for urine culture just to be complete but I doubt that she actually has a true urinary tract infection at this time.  Her labs obviously are pending and we will contact them with those results once they are available  Given her limited education I think she would benefit more from a MoCA test and am going to see if her PCPs nurse can reach out to schedule an appointment closely with Dr. Livia Browning for this test and reevaluation of her mental status off of the Benadryl.  We discussed the risks of Benadryl in the elderly and it can present as a delirium/dementia type reaction.  We discussed using an alternative like Allegra.  Her daughter wants to try Claritin again though she did report that she had had a rash previously without medicine.  I recommended using that with extreme caution.  Additionally I reviewed the MRI results from November and at that time she only showed atrophy which was appropriate for her age.  Start time: 8:55am; called back with results and recommendations at 1:28pm End time: 9:06am; 1:38pm  Total time spent on patient care (including telephone call/ virtual visit): 21 minutes  [place], [place] [phone]

## 2020-06-30 LAB — CMP14+EGFR
ALT: 18 IU/L (ref 0–32)
AST: 34 IU/L (ref 0–40)
Albumin/Globulin Ratio: 1.8 (ref 1.2–2.2)
Albumin: 4.6 g/dL (ref 3.6–4.6)
Alkaline Phosphatase: 91 IU/L (ref 44–121)
BUN/Creatinine Ratio: 10 — ABNORMAL LOW (ref 12–28)
BUN: 9 mg/dL (ref 8–27)
Bilirubin Total: 0.4 mg/dL (ref 0.0–1.2)
CO2: 25 mmol/L (ref 20–29)
Calcium: 9.7 mg/dL (ref 8.7–10.3)
Chloride: 103 mmol/L (ref 96–106)
Creatinine, Ser: 0.89 mg/dL (ref 0.57–1.00)
GFR calc Af Amer: 69 mL/min/{1.73_m2} (ref >59)
GFR calc non Af Amer: 60 mL/min/{1.73_m2} (ref >59)
Globulin, Total: 2.5 g/dL (ref 1.5–4.5)
Glucose: 84 mg/dL (ref 65–99)
Potassium: 4.2 mmol/L (ref 3.5–5.2)
Sodium: 142 mmol/L (ref 134–144)
Total Protein: 7.1 g/dL (ref 6.0–8.5)

## 2020-06-30 LAB — THYROID PANEL WITH TSH
Free Thyroxine Index: 1.9 (ref 1.2–4.9)
T3 Uptake Ratio: 27 % (ref 24–39)
T4, Total: 7.2 ug/dL (ref 4.5–12.0)
TSH: 5.38 u[IU]/mL — ABNORMAL HIGH (ref 0.450–4.500)

## 2020-06-30 LAB — CBC WITH DIFFERENTIAL/PLATELET
Basophils Absolute: 0 10*3/uL (ref 0.0–0.2)
Basos: 0 %
EOS (ABSOLUTE): 0.2 10*3/uL (ref 0.0–0.4)
Eos: 4 %
Hematocrit: 40.5 % (ref 34.0–46.6)
Hemoglobin: 13.5 g/dL (ref 11.1–15.9)
Immature Grans (Abs): 0 10*3/uL (ref 0.0–0.1)
Immature Granulocytes: 0 %
Lymphocytes Absolute: 1.6 10*3/uL (ref 0.7–3.1)
Lymphs: 27 %
MCH: 31.5 pg (ref 26.6–33.0)
MCHC: 33.3 g/dL (ref 31.5–35.7)
MCV: 94 fL (ref 79–97)
Monocytes Absolute: 0.7 10*3/uL (ref 0.1–0.9)
Monocytes: 12 %
Neutrophils Absolute: 3.4 10*3/uL (ref 1.4–7.0)
Neutrophils: 57 %
Platelets: 261 10*3/uL (ref 150–450)
RBC: 4.29 x10E6/uL (ref 3.77–5.28)
RDW: 13.1 % (ref 11.7–15.4)
WBC: 5.9 10*3/uL (ref 3.4–10.8)

## 2020-06-30 LAB — VITAMIN D 25 HYDROXY (VIT D DEFICIENCY, FRACTURES): Vit D, 25-Hydroxy: 19.2 ng/mL — ABNORMAL LOW (ref 30.0–100.0)

## 2020-06-30 LAB — VITAMIN B12: Vitamin B-12: 269 pg/mL (ref 232–1245)

## 2020-06-30 NOTE — Progress Notes (Signed)
LM for daughter to return call - needs [facility] appt in 2-3 weeks - JHB 12/16

## 2020-06-30 NOTE — Progress Notes (Signed)
Appt made for pt -= daughter aware

## 2020-07-01 ENCOUNTER — Other Ambulatory Visit: Payer: Self-pay | Admitting: Family Medicine

## 2020-07-01 ENCOUNTER — Other Ambulatory Visit: Payer: Self-pay | Admitting: Family

## 2020-07-01 DIAGNOSIS — E559 Vitamin D deficiency, unspecified: Secondary | ICD-10-CM

## 2020-07-01 LAB — URINE CULTURE

## 2020-07-01 MED ORDER — AMOXICILLIN-POT CLAVULANATE 875-125 MG PO TABS: 1.0000 | ORAL_TABLET | Freq: Two times a day (BID) | ORAL | 0 refills | Status: DC

## 2020-07-01 MED ORDER — CEPHALEXIN 500 MG PO CAPS: 500.0000 mg | ORAL_CAPSULE | Freq: Two times a day (BID) | ORAL | 0 refills | Status: DC

## 2020-07-01 MED ORDER — CHOLECALCIFEROL 1.25 MG (50000 UT) PO CAPS: 50000.0000 [IU] | ORAL_CAPSULE | ORAL | 0 refills | Status: AC

## 2020-07-01 NOTE — Progress Notes (Signed)
Daughter calls and states patient cannot take Keflex. I have sent it in Augmentin for her.

## 2020-07-06 ENCOUNTER — Ambulatory Visit: Payer: Medicare Other | Admitting: Internal Medicine

## 2020-07-07 ENCOUNTER — Other Ambulatory Visit: Payer: Self-pay | Admitting: Family Medicine

## 2020-07-07 DIAGNOSIS — I4891 Unspecified atrial fibrillation: Secondary | ICD-10-CM

## 2020-07-11 ENCOUNTER — Ambulatory Visit (INDEPENDENT_AMBULATORY_CARE_PROVIDER_SITE_OTHER): Payer: Medicare Other | Admitting: Family Medicine

## 2020-07-11 ENCOUNTER — Other Ambulatory Visit: Payer: Self-pay

## 2020-07-11 ENCOUNTER — Encounter: Payer: Self-pay | Admitting: Family Medicine

## 2020-07-11 VITALS — BP 165/65 | HR 80 | Temp 97.7°F | Resp 20 | Ht 61.0 in | Wt 124.4 lb

## 2020-07-11 DIAGNOSIS — R441 Visual hallucinations: Secondary | ICD-10-CM | POA: Diagnosis not present

## 2020-07-11 DIAGNOSIS — E039 Hypothyroidism, unspecified: Secondary | ICD-10-CM

## 2020-07-11 DIAGNOSIS — R310 Gross hematuria: Secondary | ICD-10-CM

## 2020-07-11 DIAGNOSIS — F419 Anxiety disorder, unspecified: Secondary | ICD-10-CM

## 2020-07-11 DIAGNOSIS — I4891 Unspecified atrial fibrillation: Secondary | ICD-10-CM | POA: Diagnosis not present

## 2020-07-11 MED ORDER — ESCITALOPRAM OXALATE 5 MG PO TABS: 5.0000 mg | ORAL_TABLET | Freq: Every day | ORAL | 2 refills | Status: DC

## 2020-07-11 MED ORDER — LEVOTHYROXINE SODIUM 50 MCG PO TABS: 50.0000 ug | ORAL_TABLET | Freq: Every day | ORAL | 1 refills | Status: DC

## 2020-07-11 MED ORDER — APIXABAN 2.5 MG PO TABS: 2.5000 mg | ORAL_TABLET | Freq: Two times a day (BID) | ORAL | 1 refills | Status: DC

## 2020-07-11 NOTE — Progress Notes (Signed)
Subjective:  Patient ID: Crystal Browning, female    DOB: March 02, 1934  Age: 86 y.o. MRN: [id number]  CC: No chief complaint on file.   HPI Crystal Browning presents for Follow-up on her recent urinary infection and visual hallucinations.  She finished the Augmentin and continues to have hallucinations of people coming in the house to kill her.  She also sees boys sitting out on the front porch.  There are other people who just appear and then go away.  She says she is very frightened.  She does not want to stay in her home.  Her granddaughter is with her and gives a good bit of the history.  She tells me that Crystal Browning has had people coming to stay with her and she slept soundly all night.  On one occasion a family member came to stay overnight with her and she was awake once with hallucinations and called the police.  The symptoms seem to be getting worse.  Of note is that the many mental status exam done recently was rejected by the family.  The reason is that they did not think that even at her best she would be able to tell what states she was in or what country she was in for reproduce the drawing on the MMSE.  They feel that she needs further evaluation.  Several family members have suggested that her medication may be too strong for her.  Her cardiologist recently took her off of the blood pressure medicine I had recommended.  Since that time family notes her blood pressures been around [id number] systolic at home.  He hallucinations have not gotten better.  They tell me the cardiologist felt the Eliquis could be responsible for some of the hallucinations as well.  They are interested in evaluation medications to see if any other contributing.  They realize there may be dementia, it is possibly just from age.  She does take thyroid medicine and they note that when she was on a higher dose of 50 mcg daily that she did not have any hallucinations.  She currently takes 25 mcg daily.  Depression screen [facility] 2/9  06/02/2020 03/30/2020 09/29/2019  Decreased Interest 0 0 0  Down, Depressed, Hopeless 0 0 0  PHQ - 2 Score 0 0 0  Altered sleeping - - -  Tired, decreased energy - - -  Change in appetite - - -  Feeling bad or failure about yourself  - - -  Trouble concentrating - - -  Moving slowly or fidgety/restless - - -  Suicidal thoughts - - -  PHQ-9 Score - - -  Difficult doing work/chores - - -    History Crystal Browning has a past medical history of Arthritis, Bilateral lower extremity edema (10/30/2016), Cancer ([place]) (03/28/11), Depression, Diverticulosis (02/2005), Esophageal stricture (03/03/2018), Generalized abdominal pain (07/17/2015), History of chemotherapy, Hypertension, Hypothyroid, Osteoporosis, Radiation, and TIA (transient ischemic attack).   She has a past surgical history that includes Direct laryngoscopy (03/27/2005); Tubal ligation; Total abdominal hysterectomy w/ bilateral salpingoophorectomy (1986); Cataract extraction, bilateral; and Cardioversion (N/A, 11/23/2016).   Her family history includes Alcohol abuse in her brother; COPD in her brother; Cancer (age of onset: 69) in her father; Heart disease (age of onset: 29) in her mother; Stroke in her mother.She reports that she has never smoked. She has never used smokeless tobacco. She reports that she does not drink alcohol and does not use drugs.    ROS Review of Systems  Constitutional:  Negative.  Negative for fever.  HENT: Negative.   Eyes: Negative for visual disturbance.  Respiratory: Negative for shortness of breath.   Cardiovascular: Negative for chest pain.  Gastrointestinal: Negative for abdominal pain.  Musculoskeletal: Negative for arthralgias.  Psychiatric/Behavioral: Positive for confusion, hallucinations (See HPI) and sleep disturbance. Negative for dysphoric mood. The patient is nervous/anxious.     Objective:  BP (!) 165/65    Pulse 80    Temp 97.7 F (36.5 C) (Temporal)    Resp 20    Ht 5\' 1"  (1.549 m)    Wt 124 lb 6 oz  (56.4 kg)    SpO2 100%    BMI 23.50 kg/m   BP Readings from Last 3 Encounters:  07/11/20 (!) 165/65  06/29/20 117/71  06/20/20 (!) 174/68    Wt Readings from Last 3 Encounters:  07/11/20 124 lb 6 oz (56.4 kg)  06/20/20 126 lb 12.8 oz (57.5 kg)  06/14/20 124 lb (56.2 kg)     Physical Exam Constitutional:      General: She is not in acute distress.    Appearance: Normal appearance. She is well-developed and well-nourished.  Cardiovascular:     Rate and Rhythm: Normal rate and regular rhythm.  Pulmonary:     Breath sounds: Normal breath sounds.  Skin:    General: Skin is warm and dry.  Neurological:     General: No focal deficit present.     Mental Status: She is alert. Mental status is at baseline.  Psychiatric:        Mood and Affect: Mood and affect normal.      MMSE - Mini Mental State Exam 06/29/2020  Orientation to time 4  Orientation to Place 4  Registration 3  Attention/ Calculation 2  Recall 3  Language- name 2 objects 2  Language- repeat 0  Language- follow 3 step command 3  Language- read & follow direction 1  Write a sentence 0  Copy design 0  Total score 22    Assessment & Plan:   Diagnoses and all orders for this visit:  Hallucination, visual    We have adjusted multiple medications in order to maximize her care.  She was placed back on Claritin in spite of previous reaction by the cardiologist's recommendation.  She will discontinue Benadryl completely.  I have also recommended that she repeat a urinalysis today.  We are increasing her Synthroid and decreasing the Eliquis and the Escitalopram.  I have discontinued Tiena B. Kurdziel's diphenhydrAMINE and amoxicillin-clavulanate. I am also having her maintain her B Complex-Folic Acid (B COMPLEX-VITAMIN B12 PO), acetaminophen, polyethylene glycol powder, metoprolol succinate, escitalopram, famotidine, levothyroxine, Cholecalciferol, Eliquis, and loratadine.  Allergies as of 07/11/2020       Reactions   Amlodipine Besylate Hives   Hives and confusion.   Tuberculin Tests Swelling   Vit D-vit E-safflower Oil    Claritin [loratadine] Rash   Pt's daughter states she is allergic to claritin   Paroxetine Hcl Rash      Medication List       Accurate as of July 11, 2020  4:24 PM. If you have any questions, ask your nurse or doctor.        STOP taking these medications   amoxicillin-clavulanate 875-125 MG tablet Commonly known as: AUGMENTIN Stopped by: [name], MD   diphenhydrAMINE 12.5 MG chewable tablet Commonly known as: BENADRYL Stopped by: [name], MD     TAKE these medications   acetaminophen 325 MG tablet Commonly  known as: TYLENOL Take 2 tablets (650 mg total) by mouth every 6 (six) hours as needed for mild pain, fever or headache.   B COMPLEX-VITAMIN B12 PO Take by mouth.   Cholecalciferol 1.25 MG (50000 UT) capsule Take 1 capsule (50,000 Units total) by mouth every 7 (seven) days for 5 doses. Then start OTC vit D 800 IU daily.   Eliquis 5 MG Tabs tablet Generic drug: apixaban TAKE 1 TABLET BY MOUTH TWICE A DAY   escitalopram 10 MG tablet Commonly known as: LEXAPRO Take 1 tablet (10 mg total) by mouth daily.   famotidine 20 MG tablet Commonly known as: PEPCID Take 1 tablet (20 mg total) by mouth every 12 (twelve) hours.   levothyroxine 25 MCG tablet Commonly known as: SYNTHROID Take 1 tablet (25 mcg total) by mouth daily.   loratadine 5 MG/5ML syrup Commonly known as: CLARITIN Take by mouth daily.   metoprolol succinate 100 MG 24 hr tablet Commonly known as: TOPROL-XL Take with or immediately following a meal.   polyethylene glycol powder 17 GM/SCOOP powder Commonly known as: GLYCOLAX/MIRALAX Take 17 g by mouth 2 (two) times daily. For constipation        Follow-up: No follow-ups on file.  Mechele Claude, M.D.

## 2020-07-12 ENCOUNTER — Telehealth: Payer: Self-pay

## 2020-07-12 NOTE — Telephone Encounter (Signed)
Aware of provider feedback and voiced understanding. 

## 2020-07-12 NOTE — Telephone Encounter (Signed)
The decrease is very important due to her age.

## 2020-07-13 ENCOUNTER — Other Ambulatory Visit: Payer: Self-pay | Admitting: Family Medicine

## 2020-07-13 ENCOUNTER — Other Ambulatory Visit: Payer: Self-pay

## 2020-07-13 ENCOUNTER — Telehealth: Payer: Self-pay | Admitting: Internal Medicine

## 2020-07-13 ENCOUNTER — Other Ambulatory Visit: Payer: Medicare Other

## 2020-07-13 DIAGNOSIS — R399 Unspecified symptoms and signs involving the genitourinary system: Secondary | ICD-10-CM

## 2020-07-13 DIAGNOSIS — R443 Hallucinations, unspecified: Secondary | ICD-10-CM

## 2020-07-13 LAB — MICROSCOPIC EXAMINATION

## 2020-07-13 LAB — URINALYSIS, COMPLETE
Bilirubin, UA: NEGATIVE
Glucose, UA: NEGATIVE
Ketones, UA: NEGATIVE
Nitrite, UA: NEGATIVE
Specific Gravity, UA: 1.02 (ref 1.005–1.030)
Urobilinogen, Ur: 0.2 mg/dL (ref 0.2–1.0)
pH, UA: 7 (ref 5.0–7.5)

## 2020-07-13 NOTE — Telephone Encounter (Signed)
Pt c/o medication issue:  1. Name of Medication: apixaban (ELIQUIS) 2.5 MG TABS tablet  2. How are you currently taking this medication (dosage and times per day)? 5 mg twice daily   3. Are you having a reaction (difficulty breathing--STAT)? no  4. What is your medication issue? Daughter of the patient said the patient's PCP changed the dosage of the Eliquis. The Daughter said Dr. Rennis Golden wanted her to stay on her 5 mg dosage at her last appt 06/20/20  The daughter wanted to know what Dr. Rennis Golden wanted the patient to take.

## 2020-07-13 NOTE — Telephone Encounter (Signed)
Spoke with Kathie Rhodes (ok per DPR) pt's daughter, relayed Dr. Blanchie Dessert recommendations to start taking Eliquis 2.5mg  BID. Prescription already sent per PCP. Pt's daughter verbalizes understanding and is thankful for the call.

## 2020-07-13 NOTE — Telephone Encounter (Signed)
Spoke with pt's daughter Crystal Browning (ok per DPR), per daughter she is unsure why pt's PCP changed the dosage of the Eliquis from 5mg  BID to 2.5mg  BID.  Per Dr. note there is mention that eliquis might be the cause of hallucinations however daughter states that she has been on this medication for 5+ years with no issues of hallucinations.  Daughter states that her sister went to the appointment so she wasn't able to relay that Dr. Darlyn Read would prefer for the pt on be on 5mg  BID of eliquis.  Pt's daughter would like for Dr. Rennis Golden to weigh in.

## 2020-07-13 NOTE — Telephone Encounter (Signed)
I agree with Belenda Cruise, our pharmacist - given her age and weight, she should be on 2.5 mg twice daily of Eliquis. Please advise them to reduce the dose and put through a new Rx.  Thanks.  Dr Rexene Edison

## 2020-07-13 NOTE — Telephone Encounter (Signed)
Looks like she should be on the 2.5 mg dose based on guidelines (> 86 yrs old, < 60 kg).  Has been having this filled by PCP, looks like we last refilled in 2019.  Dr. Rennis Golden only noted that she was doing well with Eliquis, not necessarily that her dose was correct.  I would tell her to do the 2.5 mg

## 2020-07-13 NOTE — Telephone Encounter (Signed)
Spoke to patient's daughter Kathie Rhodes Kristin's advice given.Stated she wanted to wait and make sure Dr.Hilty agrees with 2.5 mg.She will continue to give her 5 mg twice a day until she hears from Dr.Hilty.

## 2020-07-14 LAB — URINE CULTURE

## 2020-07-17 NOTE — Progress Notes (Signed)
Hello Crystal Browning,  Your lab result is normal and/or stable.Some minor variations that are not significant are commonly marked abnormal, but do not represent any medical problem for you.  Best regards, Stephen Baruch, M.D.

## 2020-07-18 ENCOUNTER — Encounter (HOSPITAL_COMMUNITY): Payer: Self-pay | Admitting: Emergency Medicine

## 2020-07-18 ENCOUNTER — Emergency Department (HOSPITAL_COMMUNITY): Payer: Medicare Other

## 2020-07-18 ENCOUNTER — Other Ambulatory Visit: Payer: Self-pay

## 2020-07-18 ENCOUNTER — Emergency Department (HOSPITAL_COMMUNITY)
Admission: EM | Admit: 2020-07-18 | Discharge: 2020-07-18 | Disposition: A | Discharge status: Home / Self Care | Payer: Medicare Other | Attending: Emergency Medicine | Admitting: Emergency Medicine

## 2020-07-18 ENCOUNTER — Telehealth: Payer: Self-pay

## 2020-07-18 DIAGNOSIS — Z79899 Other long term (current) drug therapy: Secondary | ICD-10-CM | POA: Insufficient documentation

## 2020-07-18 DIAGNOSIS — I4891 Unspecified atrial fibrillation: Secondary | ICD-10-CM | POA: Diagnosis not present

## 2020-07-18 DIAGNOSIS — R0602 Shortness of breath: Secondary | ICD-10-CM | POA: Insufficient documentation

## 2020-07-18 DIAGNOSIS — R062 Wheezing: Secondary | ICD-10-CM | POA: Diagnosis not present

## 2020-07-18 DIAGNOSIS — E039 Hypothyroidism, unspecified: Secondary | ICD-10-CM | POA: Insufficient documentation

## 2020-07-18 DIAGNOSIS — I10 Essential (primary) hypertension: Secondary | ICD-10-CM | POA: Diagnosis not present

## 2020-07-18 DIAGNOSIS — Z7901 Long term (current) use of anticoagulants: Secondary | ICD-10-CM | POA: Insufficient documentation

## 2020-07-18 DIAGNOSIS — R059 Cough, unspecified: Secondary | ICD-10-CM | POA: Insufficient documentation

## 2020-07-18 DIAGNOSIS — J069 Acute upper respiratory infection, unspecified: Secondary | ICD-10-CM

## 2020-07-18 DIAGNOSIS — Z20822 Contact with and (suspected) exposure to covid-19: Secondary | ICD-10-CM | POA: Diagnosis not present

## 2020-07-18 DIAGNOSIS — Z8521 Personal history of malignant neoplasm of larynx: Secondary | ICD-10-CM | POA: Diagnosis not present

## 2020-07-18 LAB — CBC WITH DIFFERENTIAL/PLATELET
Abs Immature Granulocytes: 0.02 10*3/uL (ref 0.00–0.07)
Basophils Absolute: 0 10*3/uL (ref 0.0–0.1)
Basophils Relative: 0 %
Eosinophils Absolute: 0.1 10*3/uL (ref 0.0–0.5)
Eosinophils Relative: 1 %
HCT: 40.1 % (ref 36.0–46.0)
Hemoglobin: 12.7 g/dL (ref 12.0–15.0)
Immature Granulocytes: 0 %
Lymphocytes Relative: 14 %
Lymphs Abs: 0.9 10*3/uL (ref 0.7–4.0)
MCH: 31.9 pg (ref 26.0–34.0)
MCHC: 31.7 g/dL (ref 30.0–36.0)
MCV: 100.8 fL — ABNORMAL HIGH (ref 80.0–100.0)
Monocytes Absolute: 0.6 10*3/uL (ref 0.1–1.0)
Monocytes Relative: 9 %
Neutro Abs: 4.8 10*3/uL (ref 1.7–7.7)
Neutrophils Relative %: 76 %
Platelets: 178 10*3/uL (ref 150–400)
RBC: 3.98 MIL/uL (ref 3.87–5.11)
RDW: 13.8 % (ref 11.5–15.5)
WBC: 6.4 10*3/uL (ref 4.0–10.5)
nRBC: 0 % (ref 0.0–0.2)

## 2020-07-18 LAB — COMPREHENSIVE METABOLIC PANEL
ALT: 25 U/L (ref 0–44)
AST: 36 U/L (ref 15–41)
Albumin: 3.6 g/dL (ref 3.5–5.0)
Alkaline Phosphatase: 67 U/L (ref 38–126)
Anion gap: 9 (ref 5–15)
BUN: 16 mg/dL (ref 8–23)
CO2: 25 mmol/L (ref 22–32)
Calcium: 8.9 mg/dL (ref 8.9–10.3)
Chloride: 102 mmol/L (ref 98–111)
Creatinine, Ser: 0.88 mg/dL (ref 0.44–1.00)
GFR, Estimated: >60 mL/min (ref >60)
Glucose, Bld: 125 mg/dL — ABNORMAL HIGH (ref 70–99)
Potassium: 3.9 mmol/L (ref 3.5–5.1)
Sodium: 136 mmol/L (ref 135–145)
Total Bilirubin: 0.5 mg/dL (ref 0.3–1.2)
Total Protein: 6.9 g/dL (ref 6.5–8.1)

## 2020-07-18 LAB — RESP PANEL BY RT-PCR (FLU A&B, COVID) ARPGX2
Influenza A by PCR: NEGATIVE
Influenza B by PCR: NEGATIVE
SARS Coronavirus 2 by RT PCR: NEGATIVE

## 2020-07-18 LAB — POC SARS CORONAVIRUS 2 AG -  ED: SARS Coronavirus 2 Ag: NEGATIVE

## 2020-07-18 IMAGING — DX DG CHEST 1V PORT
1 series · 1 of 1 positions shown · non-contrast
Comparison: [DATE]

CLINICAL DATA: Cough wheezing x2 days

EXAM:
PORTABLE CHEST 1 VIEW

[chest ap]
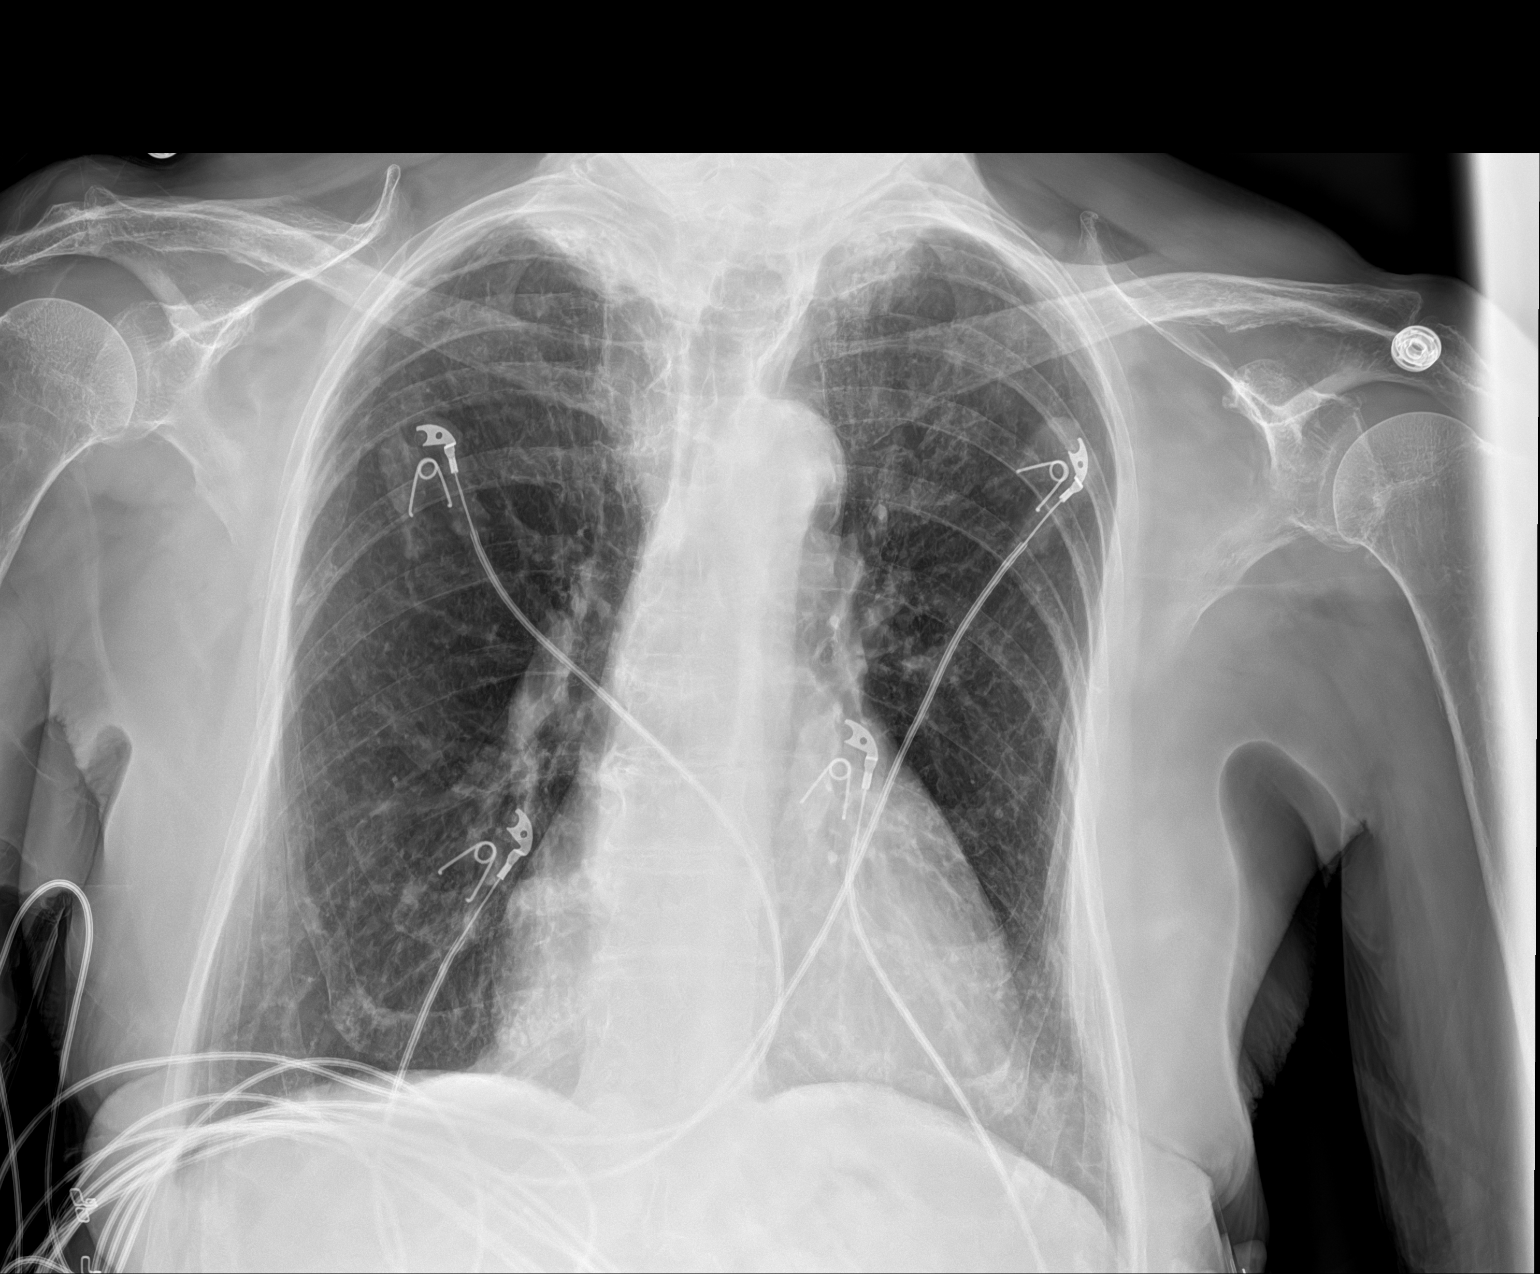

[1 of 1 positions shown; findings below may reference images not displayed]

FINDINGS: The heart size is stable but enlarged. Aortic calcifications are
noted. There is persistent pleuroparenchymal scarring at the lung
apices, right worse than left. There is no focal infiltrate. No
large pleural effusion. No pneumothorax.
IMPRESSION: No active disease.

## 2020-07-18 MED ORDER — ALBUTEROL SULFATE HFA 108 (90 BASE) MCG/ACT IN AERS
2.0000 | INHALATION_SPRAY | Freq: Once | RESPIRATORY_TRACT | Status: AC
  Administered 2020-07-18: 2 via RESPIRATORY_TRACT
  Filled 2020-07-18: qty 6.7

## 2020-07-18 NOTE — ED Provider Notes (Signed)
[facility] EMERGENCY DEPARTMENT Provider Note   CSN: [id number] Arrival date & time: 07/18/20  1514     History Chief Complaint  Patient presents with  . Cough    Crystal Browning is a 87 y.o. female.  HPI 87 year old female with a history of cancer, depression, generalized abdominal pain, hypertension, hypothyroidism, TIA, hallucinations presents to the ER with complaints of 2 days of cough, wheezing and shortness of breath.  Denies any fevers or chills.  No prior history of smoking or COPD.  Has felt tired.  She is not vaccinated for Covid.  States she has had pneumonia before and wants to make sure that she does not have it again.  Denies any known fevers.  No chest pain.  No dizziness, syncope.  No dysuria or hematuria.  No abdominal pain. No nasal congestion.     Past Medical History:  Diagnosis Date  . Arthritis   . Bilateral lower extremity edema 10/30/2016  . Cancer (HCC) 03/28/11   SCCA of the Supraglottic Larynx (T2NoMo)    ; S/P Chemoradiation therapy from 05/09/05 thru 06/29/05  . Depression    History of Depression- Lexapro therapy  . Diverticulosis 02/2005   History of diverticulosis with admission from 8/18 - 03/04/2005  . Esophageal stricture 03/03/2018  . Generalized abdominal pain 07/17/2015  . History of chemotherapy    S/P chemotherapy with Dr. Cleone Slim -2006  . Hypertension   . Hypothyroid   . Osteoporosis    History of Osteoporosis with one year of Actonel only  . Radiation    S/P radiation thrapy -06/2005  . TIA (transient ischemic attack)    History of TIA's with no residual effect    Patient Active Problem List   Diagnosis Date Noted  . CAP (community acquired pneumonia) 10/04/2019  . Pharyngoesophageal dysphagia 01/14/2018  . Long term (current) use of anticoagulants 12/14/2016  . Atrial fibrillation (HCC) 10/30/2016  . Other fatigue 10/30/2016  . Venous insufficiency of both lower extremities 10/30/2016  . History of external beam radiation therapy  08/20/2016  . History of laryngeal cancer 08/20/2016  . Xerostomia 08/20/2016  . Postmenopausal 07/17/2015  . Thyroid activity decreased 07/17/2015  . Essential hypertension 07/12/2015  . Angina pectoris (HCC) 07/12/2015  . Vitamin D deficiency 07/12/2015  . 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 CARDIOVERSION N/A 11/23/2016   Procedure: CARDIOVERSION;  Surgeon: Chrystie Nose, MD;  Location: [facility] ENDOSCOPY;  Service: Cardiovascular;  Laterality: N/A;  . CATARACT EXTRACTION, BILATERAL    . DIRECT LARYNGOSCOPY  03/27/2005   S/P direct laryngoscopy, esophagoscopy and biopsy with Dr. Pollyann Kennedy  . TOTAL ABDOMINAL HYSTERECTOMY W/ BILATERAL SALPINGOOPHORECTOMY  1986  . TUBAL LIGATION       OB History   No obstetric history on file.     Family History  Problem Relation Age of Onset  . Stroke Mother   . Heart disease Mother 10  . Cancer Father 72       Prostate  . Alcohol abuse Brother   . COPD Brother     Social History   Tobacco Use  . Smoking status: Never Smoker  . Smokeless tobacco: Never Used  Vaping Use  . Vaping Use: Never used  Substance Use Topics  . Alcohol use: No  . Drug use: No    Home Medications Prior to Admission medications   Medication Sig Start Date End Date Taking? Authorizing Provider  acetaminophen (  TYLENOL) 325 MG tablet Take 2 tablets (650 mg total) by mouth every 6 (six) hours as needed for mild pain, fever or headache. 10/07/19   Roxan Hockey, MD  apixaban (ELIQUIS) 2.5 MG TABS tablet Take 1 tablet (2.5 mg total) by mouth 2 (two) times daily. 07/11/20   [name], MD  B Complex-Folic Acid (B COMPLEX-VITAMIN B12 PO) Take by mouth.  Patient not taking: Reported on 07/11/2020    [provider]  Cholecalciferol 1.25 MG (50000 UT) capsule Take 1 capsule (50,000 Units total) by mouth every 7 (seven) days for 5 doses. Then start OTC vit D 800 IU daily. Patient not  taking: Reported on 07/11/2020 07/01/20 07/30/20  Ronnie Doss M, DO  escitalopram (LEXAPRO) 5 MG tablet Take 1 tablet (5 mg total) by mouth daily. 07/11/20   [name], MD  famotidine (PEPCID) 20 MG tablet Take 1 tablet (20 mg total) by mouth every 12 (twelve) hours. 03/30/20   [name], MD  levothyroxine (SYNTHROID) 50 MCG tablet Take 1 tablet (50 mcg total) by mouth daily. 07/11/20   [name], MD  loratadine (CLARITIN) 5 MG/5ML syrup Take by mouth daily.    [provider]  metoprolol succinate (TOPROL-XL) 100 MG 24 hr tablet Take with or immediately following a meal. 03/30/20   [name], MD  polyethylene glycol powder (GLYCOLAX/MIRALAX) 17 GM/SCOOP powder Take 17 g by mouth 2 (two) times daily. For constipation 10/07/19   Roxan Hockey, MD    Allergies    Amlodipine besylate, Tuberculin tests, Vit d-vit e-safflower oil, Claritin [loratadine], and Paroxetine hcl  Review of Systems   Review of Systems  Constitutional: Negative for chills and fever.  HENT: Negative for ear pain and sore throat.   Eyes: Negative for pain and visual disturbance.  Respiratory: Positive for cough and shortness of breath.   Cardiovascular: Negative for chest pain and palpitations.  Gastrointestinal: Negative for abdominal pain and vomiting.  Genitourinary: Negative for dysuria and hematuria.  Musculoskeletal: Negative for arthralgias and back pain.  Skin: Negative for color change and rash.  Neurological: Negative for seizures and syncope.  All other systems reviewed and are negative.   Physical Exam Updated Vital Signs BP (!) 166/63 (BP Location: Right Arm)   Pulse 65   Temp 98.1 F (36.7 C) (Oral)   Resp 16   Ht 5\' 1"  (1.549 m)   Wt 56.2 kg   SpO2 97%   BMI 23.43 kg/m   Physical Exam Vitals and nursing note reviewed.  Constitutional:      General: She is not in acute distress.    Appearance: She is well-developed and well-nourished.  HENT:     Head:  Normocephalic and atraumatic.  Eyes:     Conjunctiva/sclera: Conjunctivae normal.  Cardiovascular:     Rate and Rhythm: Normal rate and regular rhythm.     Heart sounds: No murmur heard.   Pulmonary:     Effort: Pulmonary effort is normal. No respiratory distress.     Breath sounds: Normal breath sounds.     Comments: Mildly decreased breath sounds throughout no audible wheezes Abdominal:     Palpations: Abdomen is soft.     Tenderness: There is no abdominal tenderness.  Musculoskeletal:        General: No edema.     Cervical back: Neck supple.  Skin:    General: Skin is warm and dry.  Neurological:     Mental Status: She is alert.  Psychiatric:  Mood and Affect: Mood and affect normal.     ED Results / Procedures / Treatments   Labs (all labs ordered are listed, but only abnormal results are displayed) Labs Reviewed  CBC WITH DIFFERENTIAL/PLATELET - Abnormal; Notable for the following components:      Result Value   MCV 100.8 (*)    All other components within normal limits  COMPREHENSIVE METABOLIC PANEL - Abnormal; Notable for the following components:   Glucose, Bld 125 (*)    All other components within normal limits  RESP PANEL BY RT-PCR (FLU A&B, COVID) ARPGX2  POC SARS CORONAVIRUS 2 AG -  ED    EKG EKG Interpretation  Date/Time:  Monday July 18 2020 15:47:13 EST Ventricular Rate:  75 PR Interval:    QRS Duration: 108 QT Interval:  390 QTC Calculation: 436 R Axis:   -56 Text Interpretation: Atrial fibrillation Incomplete RBBB and LAFB Confirmed by Sherwood Gambler [phone]) on 07/18/2020 4:37:53 PM   Radiology DG Chest Portable 1 View  Result Date: 07/18/2020 CLINICAL DATA:  Cough wheezing x2 days EXAM: PORTABLE CHEST 1 VIEW COMPARISON:  May 30, 2020 FINDINGS: The heart size is stable but enlarged. Aortic calcifications are noted. There is persistent pleuroparenchymal scarring at the lung apices, right worse than left. There is no focal  infiltrate. No large pleural effusion. No pneumothorax. IMPRESSION: No active disease. Electronically Signed   By: Constance Holster M.D.   On: 07/18/2020 16:13    Procedures Procedures (including critical care time)  Medications Ordered in ED Medications  albuterol (VENTOLIN HFA) 108 (90 Base) MCG/ACT inhaler 2 puff (2 puffs Inhalation Given 07/18/20 1701)    ED Course  I have reviewed the triage vital signs and the nursing notes.  Pertinent labs & imaging results that were available during my care of the patient were reviewed by me and considered in my medical decision making (see chart for details).    MDM Rules/Calculators/A&P                          87 year old female with 2 days of cough.  On arrival, patient is alert, oriented, nontoxic-appearing, not tachypneic, speaking full sentences without increased work of breathing or audible wheezes.  Vitals on arrival overall with some hypertension with a blood pressure 166/63, however afebrile, not hypoxic, tachypneic tachycardic.  CBC and CMP largely unremarkable.  Her rapid Covid test is negative.  Chest x-ray without evidence of pneumonia.  Will give several puffs of albuterol here, however no audible wheezes on my exam.  Patient's EKG with A. fib, no evidence of RVR.  Denying any chest pain at this time.  Patient ambulated in the ED without evidence of hypoxia.  Suspect viral cause of her cough, reassuring work-up discussed with the patient and the patient's daughter-in-law.  Covid test is still pending, however low suspicion for PE, ACS, dissection.  Instructed the patient's daughter-in-law to follow-up on the results of the Covid test via MyChart, and to quarantine until the results come back.  Supportive care discussed.  Encourage PCP follow-up.  The patient and the patient's daughter-in-law voiced understanding and are agreeable.  This was a shared visit with my supervising physician Dr. Regenia Skeeter who independently saw and evaluated the  patient & provided guidance in evaluation/management/disposition ,in agreement with care   Final Clinical Impression(s) / ED Diagnoses Final diagnoses:  Cough    Rx / DC Orders ED Discharge Orders    None  Garald Balding, PA-C 07/18/20 1702    Sherwood Gambler, MD 07/18/20 [phone]

## 2020-07-18 NOTE — Discharge Instructions (Addendum)
Your COVID test is negative. Your symptoms are likley due to a viral upper respiratory infection. We do not treat this with antibiotics.  Please make sure to drink plenty fluids, take cold and flu medicines.  Your symptoms are likely due to a viral illness.  Please make sure to follow-up with your primary care doctor.  Return to the ER for any new or worsening symptoms.

## 2020-07-18 NOTE — ED Notes (Signed)
Pt and granddaughter educated on follow up care.  Both verbalized understanding of care.  Pt ambulated to front lobby.

## 2020-07-18 NOTE — Telephone Encounter (Signed)
She was hallucinating before the decrease. That is why the med was decreased. IF it continues an entierly different medication will be needed. However, she needs an adequate trial of the new dose first

## 2020-07-18 NOTE — Telephone Encounter (Signed)
Pts daughter returned missed call. Reviewed Dr Darlyn Read note with her. Daughter says she is worried that pt may have a heart attack because she has been having hallucinations for about 6 months all together. Says she had to give pt 2 (5 mg) tablets of Lexapro to calm her down because she was shaking so bad. Believes she may need an increase in her Lexapro though because she still hallucinates even with taking the 10 mg.  NOTE: Thyroid and blood thinner meds are working good.

## 2020-07-18 NOTE — Telephone Encounter (Signed)
Left message to call back  

## 2020-07-18 NOTE — ED Triage Notes (Addendum)
Pt daughter in law states pt has been coughing and wheezing and sob that started 2 days ago and hallucinations that started 6 months ago.

## 2020-07-20 ENCOUNTER — Encounter: Payer: Self-pay | Admitting: Emergency Medicine

## 2020-07-20 ENCOUNTER — Ambulatory Visit
Admission: EM | Admit: 2020-07-20 | Discharge: 2020-07-20 | Disposition: A | Discharge status: Home / Self Care | Payer: Medicare Other | Attending: Emergency Medicine | Admitting: Emergency Medicine

## 2020-07-20 ENCOUNTER — Other Ambulatory Visit: Payer: Self-pay | Admitting: Family Medicine

## 2020-07-20 ENCOUNTER — Ambulatory Visit: Payer: Medicare Other | Admitting: Family Medicine

## 2020-07-20 ENCOUNTER — Other Ambulatory Visit: Payer: Self-pay

## 2020-07-20 ENCOUNTER — Telehealth: Payer: Self-pay

## 2020-07-20 DIAGNOSIS — J208 Acute bronchitis due to other specified organisms: Secondary | ICD-10-CM

## 2020-07-20 DIAGNOSIS — F419 Anxiety disorder, unspecified: Secondary | ICD-10-CM

## 2020-07-20 MED ORDER — AZITHROMYCIN 250 MG PO TABS: 250.0000 mg | ORAL_TABLET | Freq: Every day | ORAL | 0 refills | Status: DC

## 2020-07-20 MED ORDER — PREDNISONE 10 MG PO TABS: 20.0000 mg | ORAL_TABLET | Freq: Every day | ORAL | 0 refills | Status: AC

## 2020-07-20 MED ORDER — ESCITALOPRAM OXALATE 10 MG PO TABS: 10.0000 mg | ORAL_TABLET | Freq: Every day | ORAL | 1 refills | Status: DC

## 2020-07-20 NOTE — Discharge Instructions (Addendum)
Get plenty of rest and push fluids Continue Tessalon Perles prescribed for cough Prednisone was prescribed  Azithromycin was prescribed/take as directed use medications daily for symptom relief Use OTC medications like ibuprofen or tylenol as needed fever or pain Call or go to the ED if you have any new or worsening symptoms such as fever, worsening cough, shortness of breath, chest tightness, chest pain, turning blue, changes in mental status, etc..Marland Kitchen

## 2020-07-20 NOTE — Telephone Encounter (Signed)
Her Covid test was negative.  I sent in the Lexapro 10 mg prescription.  She will need to follow-up here regarding the swallowing problems.

## 2020-07-20 NOTE — Telephone Encounter (Signed)
Aware and verbalizes understanding.  

## 2020-07-20 NOTE — ED Triage Notes (Signed)
Productive cough since Sunday.  Was seen in ER on Monday and was Covid negative.  Chest x-ray done at that time.

## 2020-07-20 NOTE — ED Provider Notes (Signed)
[facility] CARE CENTER   [id number] 07/20/20 Arrival Time: 1149   Chief Complaint  Patient presents with  . Cough     SUBJECTIVE: History from: patient.  Crystal Browning is a 87 y.o. female who presented to the urgent care for complaint of cough with green sputum for the past 3 to 4 days.  Has tested negative for COVID-19 in the ER.  Denies sick exposure to COVID, flu or strep.  Denies recent travel.  Has tried [employer] with mild relief.  Denies alleviating or aggravating factors.  Denies previous symptoms in the past.   Denies fever, chills, fatigue, sinus pain, rhinorrhea, sore throat, SOB, wheezing, chest pain, nausea, changes in bowel or bladder habits.     ROS: As per HPI.  All other pertinent ROS negative.      Past Medical History:  Diagnosis Date  . Arthritis   . Bilateral lower extremity edema 10/30/2016  . Cancer (HCC) 03/28/11   SCCA of the Supraglottic Larynx (T2NoMo)    ; S/P Chemoradiation therapy from 05/09/05 thru 06/29/05  . Depression    History of Depression- Lexapro therapy  . Diverticulosis 02/2005   History of diverticulosis with admission from 8/18 - 03/04/2005  . Esophageal stricture 03/03/2018  . Generalized abdominal pain 07/17/2015  . History of chemotherapy    S/P chemotherapy with Dr. Cleone Slim -2006  . Hypertension   . Hypothyroid   . Osteoporosis    History of Osteoporosis with one year of Actonel only  . Radiation    S/P radiation thrapy -06/2005  . TIA (transient ischemic attack)    History of TIA's with no residual effect   Past Surgical History:  Procedure Laterality Date  . CARDIOVERSION N/A 11/23/2016   Procedure: CARDIOVERSION;  Surgeon: Chrystie Nose, MD;  Location: [facility] ENDOSCOPY;  Service: Cardiovascular;  Laterality: N/A;  . CATARACT EXTRACTION, BILATERAL    . DIRECT LARYNGOSCOPY  03/27/2005   S/P direct laryngoscopy, esophagoscopy and biopsy with Dr. Pollyann Kennedy  . TOTAL ABDOMINAL HYSTERECTOMY W/ BILATERAL SALPINGOOPHORECTOMY  1986  .  TUBAL LIGATION     Allergies  Allergen Reactions  . Amlodipine Besylate Hives    Hives and confusion.  . Tuberculin Tests Swelling  . Vit D-Vit E-Safflower Oil   . Claritin [Loratadine] Rash    Pt's daughter states she is allergic to claritin  . Paroxetine Hcl Rash   No current facility-administered medications on file prior to encounter.   Current Outpatient Medications on File Prior to Encounter  Medication Sig Dispense Refill  . acetaminophen (TYLENOL) 325 MG tablet Take 2 tablets (650 mg total) by mouth every 6 (six) hours as needed for mild pain, fever or headache. 12 tablet 0  . apixaban (ELIQUIS) 2.5 MG TABS tablet Take 1 tablet (2.5 mg total) by mouth 2 (two) times daily. 180 tablet 1  . B Complex-Folic Acid (B COMPLEX-VITAMIN B12 PO) Take by mouth.  (Patient not taking: Reported on 07/11/2020)    . Cholecalciferol 1.25 MG (50000 UT) capsule Take 1 capsule (50,000 Units total) by mouth every 7 (seven) days for 5 doses. Then start OTC vit D 800 IU daily. (Patient not taking: Reported on 07/11/2020) 5 capsule 0  . escitalopram (LEXAPRO) 10 MG tablet Take 1 tablet (10 mg total) by mouth daily. 30 tablet 1  . famotidine (PEPCID) 20 MG tablet Take 1 tablet (20 mg total) by mouth every 12 (twelve) hours. 180 tablet 1  . levothyroxine (SYNTHROID) 50 MCG tablet Take 1 tablet (50  mcg total) by mouth daily. 90 tablet 1  . loratadine (CLARITIN) 5 MG/5ML syrup Take by mouth daily.    . metoprolol succinate (TOPROL-XL) 100 MG 24 hr tablet Take with or immediately following a meal. 90 tablet 1  . polyethylene glycol powder (GLYCOLAX/MIRALAX) 17 GM/SCOOP powder Take 17 g by mouth 2 (two) times daily. For constipation 850 g 3   Social History   Socioeconomic History  . Marital status: Widowed    Spouse name: Not on file  . Number of children: 6  . Years of education: Not on file  . Highest education level: Not on file  Occupational History  . Not on file  Tobacco Use  . Smoking status:  Never Smoker  . Smokeless tobacco: Never Used  Vaping Use  . Vaping Use: Never used  Substance and Sexual Activity  . Alcohol use: No  . Drug use: No  . Sexual activity: Not on file  Other Topics Concern  . Not on file  Social History Narrative  . Not on file   Social Determinants of Health   Financial Resource Strain: Not on file  Food Insecurity: Not on file  Transportation Needs: Not on file  Physical Activity: Not on file  Stress: Not on file  Social Connections: Not on file  Intimate Partner Violence: Not on file   Family History  Problem Relation Age of Onset  . Stroke Mother   . Heart disease Mother 53  . Cancer Father 31       Prostate  . Alcohol abuse Brother   . COPD Brother     OBJECTIVE:  Vitals:   07/20/20 1226  BP: (!) 164/64  Resp: 18  Temp: 99.9 F (37.7 C)  TempSrc: Oral  SpO2: 94%  Weight: 124 lb (56.2 kg)  Height: 5\' 1"  (1.549 m)     General appearance: alert; appears fatigued, but nontoxic; speaking in full sentences and tolerating own secretions HEENT: NCAT; Ears: EACs clear, TMs pearly gray; Eyes: PERRL.  EOM grossly intact. Sinuses: nontender; Nose: nares patent without rhinorrhea, Throat: oropharynx clear, tonsils non erythematous or enlarged, uvula midline  Neck: supple without LAD Lungs: unlabored respirations, symmetrical air entry; cough: moderate; no respiratory distress; CTAB Heart: regular rate and rhythm.  Radial pulses 2+ symmetrical bilaterally Skin: warm and dry Psychological: alert and cooperative; normal mood and affect  LABS:  No results found for this or any previous visit (from the past 24 hour(s)).   RADIOLOGY:  DG Chest Portable 1 View  Result Date: 07/18/2020 CLINICAL DATA:  Cough wheezing x2 days EXAM: PORTABLE CHEST 1 VIEW COMPARISON:  May 30, 2020 FINDINGS: The heart size is stable but enlarged. Aortic calcifications are noted. There is persistent pleuroparenchymal scarring at the lung apices, right worse  than left. There is no focal infiltrate. No large pleural effusion. No pneumothorax. IMPRESSION: No active disease. Electronically Signed   By: Katherine Mantle M.D.   On: 07/18/2020 16:13   Chest x-ray is negative for cardiopulmonary disease.  I have reviewed the x-ray myself and the radiologist interpretation.  I am in agreement with the radiologist interpretation.   ASSESSMENT & PLAN:  1. Acute bronchitis due to other specified organisms     Meds ordered this encounter  Medications  . azithromycin (ZITHROMAX) 250 MG tablet    Sig: Take 1 tablet (250 mg total) by mouth daily. Take first 2 tablets together, then 1 every day until finished.    Dispense:  6 tablet  Refill:  0  . predniSONE (DELTASONE) 10 MG tablet    Sig: Take 2 tablets (20 mg total) by mouth daily for 5 days.    Dispense:  10 tablet    Refill:  0   Discharge instructions  Get plenty of rest and push fluids Continue Tessalon Perles prescribed for cough Prednisone was prescribed  Azithromycin was prescribed/take as directed use medications daily for symptom relief Use OTC medications like ibuprofen or tylenol as needed fever or pain Call or go to the ED if you have any new or worsening symptoms such as fever, worsening cough, shortness of breath, chest tightness, chest pain, turning blue, changes in mental status, etc...   Reviewed expectations re: course of current medical issues. Questions answered. Outlined signs and symptoms indicating need for more acute intervention. Patient verbalized understanding. After Visit Summary given.         Emerson Monte, [place] 07/20/20 1312

## 2020-07-21 ENCOUNTER — Telehealth: Payer: Self-pay | Admitting: Internal Medicine

## 2020-07-21 ENCOUNTER — Telehealth: Payer: Self-pay | Admitting: Family Medicine

## 2020-07-21 NOTE — Telephone Encounter (Signed)
She is on the correct dose for her age and weight.  What changes is she feeling?

## 2020-07-21 NOTE — Telephone Encounter (Signed)
Patient has been taking 2.5 mg of Eliquis and they are wondering if it is okay for her to increase back to 5 mg.  They have noticed she seems to be having more fatigue and some shortness of breath.  I advised them to contact her cardiologist but she said they had spoken with them earlier today and they told them to contact you and see what you recommended.

## 2020-07-21 NOTE — Telephone Encounter (Signed)
Pt's daughter is calling in to find out if it is okay for the pt to take blood thinners, said that she still has the medication but wanted to check with Dr Darlyn Read before the pt took them

## 2020-07-21 NOTE — Telephone Encounter (Signed)
Pt c/o medication issue:  1. Name of Medication: apixaban (ELIQUIS) 2.5 MG TABS tablet  2. How are you currently taking this medication (dosage and times per day)? 1 tablet by mouth two times daily   3. Are you having a reaction (difficulty breathing--STAT)? Yes   4. What is your medication issue? Crystal Browning is calling wanting to discuss increasing her Eliquis back to 5 MG's. She states she has noticed some changes and feels she was better off before. Please advise.

## 2020-07-22 ENCOUNTER — Encounter: Payer: Self-pay | Admitting: Family Medicine

## 2020-07-22 ENCOUNTER — Ambulatory Visit (INDEPENDENT_AMBULATORY_CARE_PROVIDER_SITE_OTHER): Payer: Medicare Other | Admitting: Family Medicine

## 2020-07-22 DIAGNOSIS — I4891 Unspecified atrial fibrillation: Secondary | ICD-10-CM | POA: Diagnosis not present

## 2020-07-22 DIAGNOSIS — R441 Visual hallucinations: Secondary | ICD-10-CM | POA: Diagnosis not present

## 2020-07-22 MED ORDER — QUETIAPINE FUMARATE 25 MG PO TABS: 25.0000 mg | ORAL_TABLET | Freq: Every day | ORAL | 1 refills | Status: DC

## 2020-07-22 NOTE — Telephone Encounter (Signed)
Pt's daughter Inez Catalina reports that she has noticed increased fatigue and shortness of breath in the pt since her medications were adjusted 2 weeks ago by PCP Dr. Livia Snellen. Daughter states pt was experiencing some cognitive issues that prompted adjustments of several of her medications. Daughter states the pt's lexapro and thyroid medications were also titrated along with the eliquis being reduced to 2.5 mg per day. She states the pt "did better with symptoms of her afib" on the 5 mg bid dose.

## 2020-07-22 NOTE — Progress Notes (Signed)
    Subjective:    Patient ID: Crystal Browning, female    DOB: 05-Feb-1934, 87 y.o.   MRN: [id number]   HPI: Crystal Browning is a 87 y.o. female presenting for increasing hallucinations. Worse with decrease of lexapro now than last week. Family went back to 10 mg. Pt. Is calmer, but stilll having hallucinations.She is scared due to hallucinations. Staggering. [name], daughter says she has been breathing worse as well.    Depression screen [facility] 2/9 06/02/2020 03/30/2020 09/29/2019 05/14/2019 10/17/2018  Decreased Interest 0 0 0 1 2  Down, Depressed, Hopeless 0 0 0 1 2  PHQ - 2 Score 0 0 0 2 4  Altered sleeping - - - 0 1  Tired, decreased energy - - - 1 2  Change in appetite - - - 1 1  Feeling bad or failure about yourself  - - - 2 0  Trouble concentrating - - - 0 1  Moving slowly or fidgety/restless - - - 0 1  Suicidal thoughts - - - 0 0  PHQ-9 Score - - - 6 10  Difficult doing work/chores - - - Somewhat difficult -     Relevant past medical, surgical, family and social history reviewed and updated as indicated.  Interim medical history since our last visit reviewed. Allergies and medications reviewed and updated.  ROS:  Review of Systems   Social History   Tobacco Use  Smoking Status Never Smoker  Smokeless Tobacco Never Used       Objective:     Wt Readings from Last 3 Encounters:  07/20/20 124 lb (56.2 kg)  07/18/20 124 lb (56.2 kg)  07/11/20 124 lb 6 oz (56.4 kg)     Exam deferred. Pt. Harboring due to COVID 19. Phone visit performed.   Assessment & Plan:   1. Hallucination, visual   2. Atrial fibrillation, unspecified type ([place])     Meds ordered this encounter  Medications  . QUEtiapine (SEROQUEL) 25 MG tablet    Sig: Take 1 tablet (25 mg total) by mouth at bedtime.    Dispense:  90 tablet    Refill:  1    No orders of the defined types were placed in this encounter.     Diagnoses and all orders for this visit:  Hallucination, visual  Atrial  fibrillation, unspecified type ([place])  Other orders -     QUEtiapine (SEROQUEL) 25 MG tablet; Take 1 tablet (25 mg total) by mouth at bedtime.    Virtual Visit via telephone Note  I discussed the limitations, risks, security and privacy concerns of performing an evaluation and management service by telephone and the availability of in person appointments. The patient was identified with two identifiers. Pt.expressed understanding and agreed to proceed. Pt. Is at home. Dr. Livia Snellen is in his office.  Follow Up Instructions:   I discussed the assessment and treatment plan with the patient. The patient was provided an opportunity to ask questions and all were answered. The patient agreed with the plan and demonstrated an understanding of the instructions.   The patient was advised to call back or seek an in-person evaluation if the symptoms worsen or if the condition fails to improve as anticipated.   Total minutes including chart review and phone contact time: 22   Follow up plan: Return in about 2 weeks (around 08/05/2020).  Crystal Fraise, MD [place]

## 2020-07-22 NOTE — Telephone Encounter (Signed)
Relayed pharmacist's response to pt's daughter. She verbalizes understanding and notes that pt is scheduled for a telehealth visit with PCP today.

## 2020-07-22 NOTE — Telephone Encounter (Signed)
Eliquis does not affect afib symptoms. It only makes it harder for the blood to clot, therefore protecting from a stroke. It does not actually affect heart rhythm or rate. Decreasing the dose would not affect her afib symptoms.  She should discuss these concerns with her PCP.  Sounds like she also has a viral infection that could be causing these symptoms. Patient was in afib on last EKG but she was rate controlled. Would ask Dr. Debara Pickett if she needs cardiology eval, but the dose of her Eliquis should stay at 2.5mg  BID. This is not causing her symptoms

## 2020-07-22 NOTE — Telephone Encounter (Signed)
Attempted to call patient's daughter - phone rang w/no answer

## 2020-07-22 NOTE — Telephone Encounter (Signed)
I switched it back during theis morning's phone visit

## 2020-07-27 DIAGNOSIS — R062 Wheezing: Secondary | ICD-10-CM | POA: Diagnosis not present

## 2020-07-27 DIAGNOSIS — R059 Cough, unspecified: Secondary | ICD-10-CM | POA: Diagnosis not present

## 2020-07-27 DIAGNOSIS — R069 Unspecified abnormalities of breathing: Secondary | ICD-10-CM | POA: Diagnosis not present

## 2020-07-27 DIAGNOSIS — I4891 Unspecified atrial fibrillation: Secondary | ICD-10-CM | POA: Diagnosis not present

## 2020-08-07 ENCOUNTER — Other Ambulatory Visit: Payer: Self-pay

## 2020-08-07 ENCOUNTER — Emergency Department (HOSPITAL_COMMUNITY): Payer: Medicare Other

## 2020-08-07 ENCOUNTER — Emergency Department (HOSPITAL_COMMUNITY)
Admission: EM | Admit: 2020-08-07 | Discharge: 2020-08-07 | Disposition: A | Discharge status: Home / Self Care | Payer: Medicare Other | Attending: Emergency Medicine | Admitting: Emergency Medicine

## 2020-08-07 ENCOUNTER — Encounter (HOSPITAL_COMMUNITY): Payer: Self-pay

## 2020-08-07 DIAGNOSIS — M79603 Pain in arm, unspecified: Secondary | ICD-10-CM | POA: Diagnosis not present

## 2020-08-07 DIAGNOSIS — W19XXXA Unspecified fall, initial encounter: Secondary | ICD-10-CM

## 2020-08-07 DIAGNOSIS — S199XXA Unspecified injury of neck, initial encounter: Secondary | ICD-10-CM | POA: Diagnosis not present

## 2020-08-07 DIAGNOSIS — Z79899 Other long term (current) drug therapy: Secondary | ICD-10-CM | POA: Diagnosis not present

## 2020-08-07 DIAGNOSIS — S42002A Fracture of unspecified part of left clavicle, initial encounter for closed fracture: Secondary | ICD-10-CM | POA: Diagnosis not present

## 2020-08-07 DIAGNOSIS — Z7901 Long term (current) use of anticoagulants: Secondary | ICD-10-CM | POA: Diagnosis not present

## 2020-08-07 DIAGNOSIS — I10 Essential (primary) hypertension: Secondary | ICD-10-CM | POA: Insufficient documentation

## 2020-08-07 DIAGNOSIS — R0781 Pleurodynia: Secondary | ICD-10-CM | POA: Diagnosis not present

## 2020-08-07 DIAGNOSIS — S42022A Displaced fracture of shaft of left clavicle, initial encounter for closed fracture: Secondary | ICD-10-CM

## 2020-08-07 DIAGNOSIS — S0990XA Unspecified injury of head, initial encounter: Secondary | ICD-10-CM | POA: Diagnosis not present

## 2020-08-07 DIAGNOSIS — Z8673 Personal history of transient ischemic attack (TIA), and cerebral infarction without residual deficits: Secondary | ICD-10-CM | POA: Diagnosis not present

## 2020-08-07 DIAGNOSIS — W0110XA Fall on same level from slipping, tripping and stumbling with subsequent striking against unspecified object, initial encounter: Secondary | ICD-10-CM | POA: Insufficient documentation

## 2020-08-07 DIAGNOSIS — E039 Hypothyroidism, unspecified: Secondary | ICD-10-CM | POA: Insufficient documentation

## 2020-08-07 DIAGNOSIS — M25552 Pain in left hip: Secondary | ICD-10-CM | POA: Diagnosis not present

## 2020-08-07 DIAGNOSIS — R0902 Hypoxemia: Secondary | ICD-10-CM | POA: Diagnosis not present

## 2020-08-07 DIAGNOSIS — R52 Pain, unspecified: Secondary | ICD-10-CM | POA: Diagnosis not present

## 2020-08-07 DIAGNOSIS — S4992XA Unspecified injury of left shoulder and upper arm, initial encounter: Secondary | ICD-10-CM | POA: Diagnosis present

## 2020-08-07 DIAGNOSIS — I4891 Unspecified atrial fibrillation: Secondary | ICD-10-CM | POA: Diagnosis not present

## 2020-08-07 IMAGING — DX DG CLAVICLE*L*
2 series · 2 of 2 positions shown · non-contrast
Comparison: None.

CLINICAL DATA: Status post fall with left shoulder pain.

EXAM:
LEFT CLAVICLE - 2+ VIEWS

[clavicle ap]
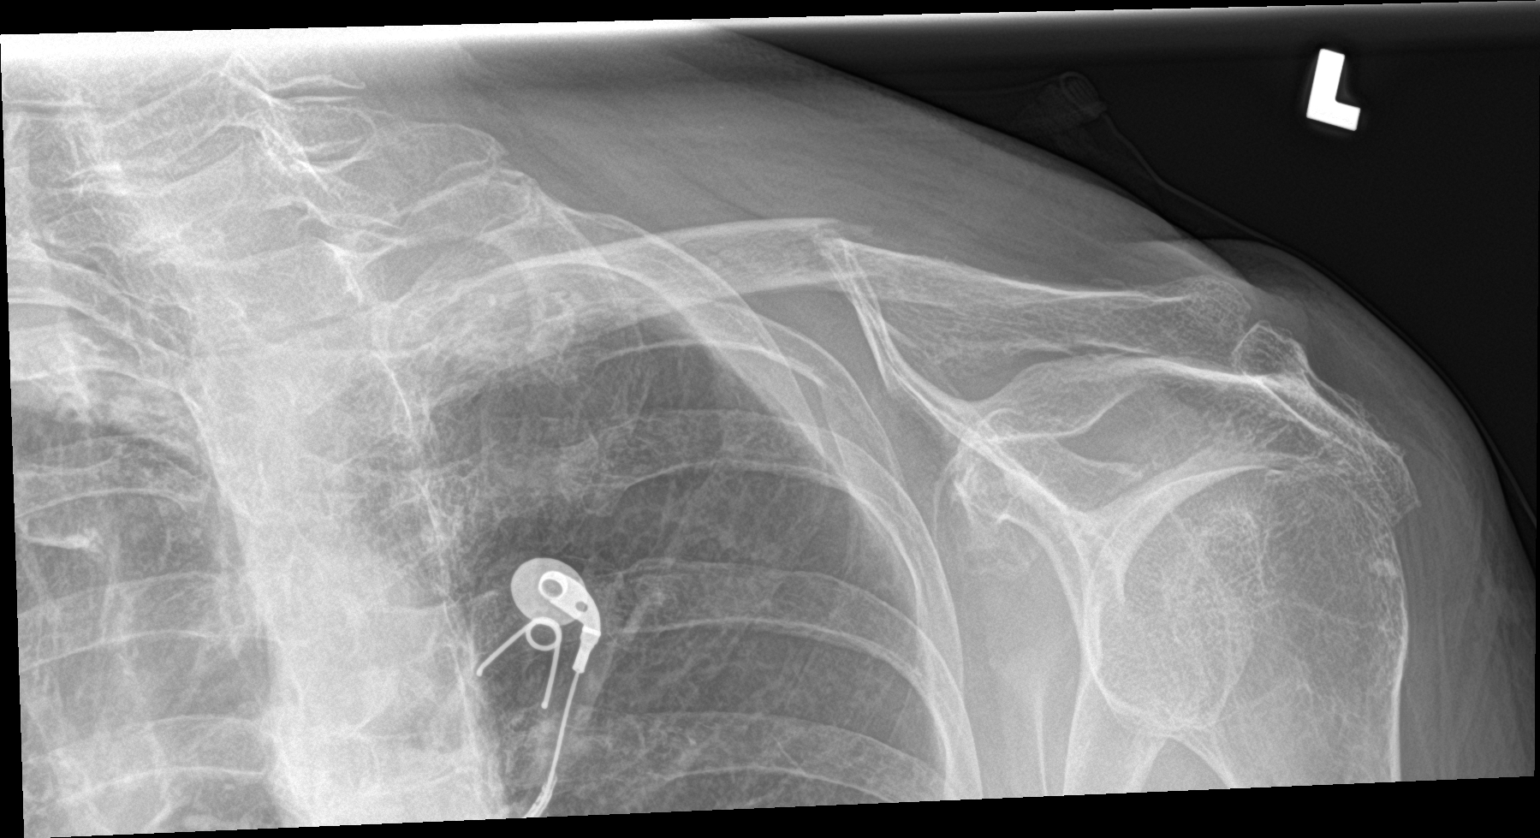

[clavicle axial]
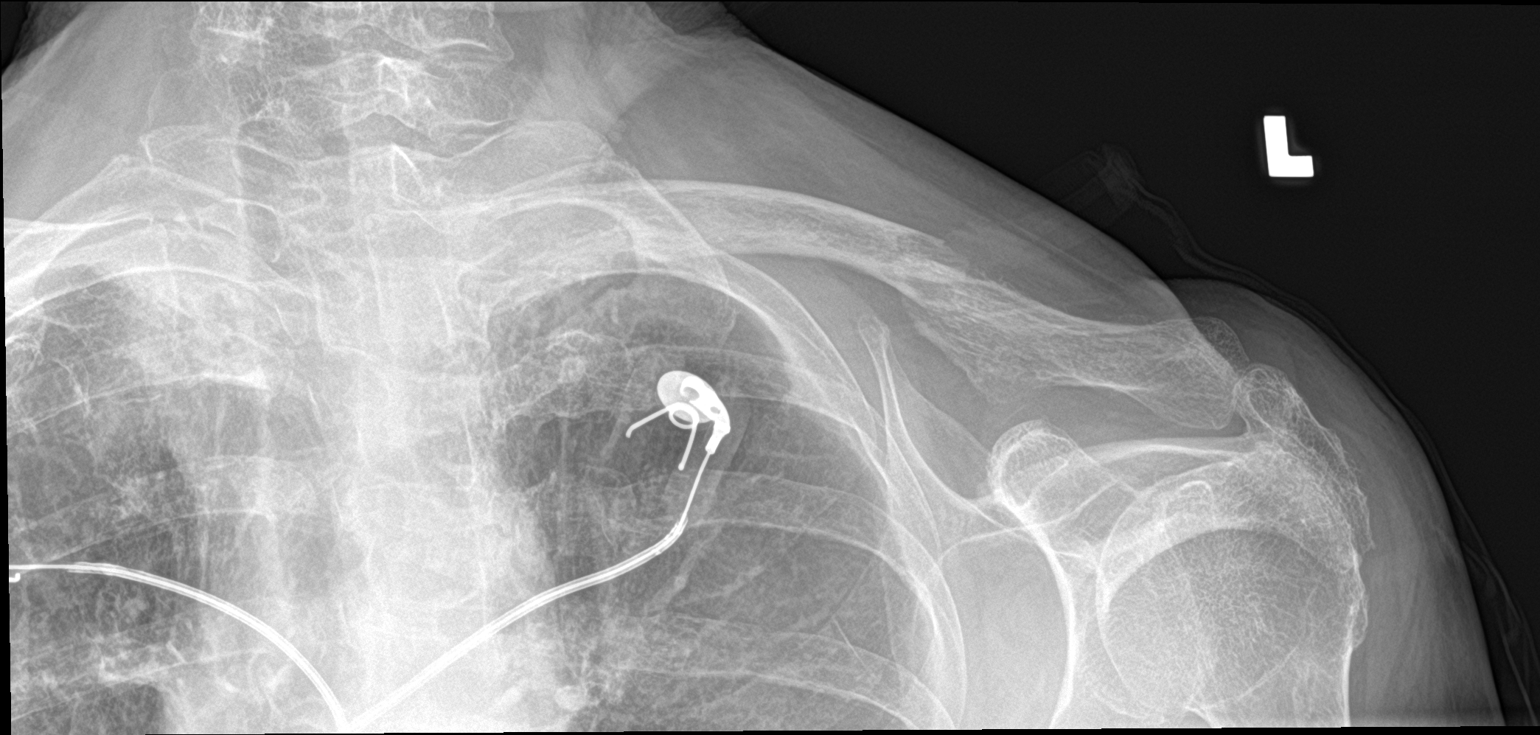

[2 of 2 positions shown; findings below may reference images not displayed]

FINDINGS: Displaced fracture of the mid left clavicle is identified. There is
no dislocation.
IMPRESSION: Displaced fracture of mid left clavicle.

## 2020-08-07 IMAGING — DX DG SHOULDER 2+V*L*
2 series · 2 of 2 positions shown · non-contrast
Comparison: None.

CLINICAL DATA: Status post fall with left shoulder pain.

EXAM:
LEFT SHOULDER - 2+ VIEW

[shoulder grashey]
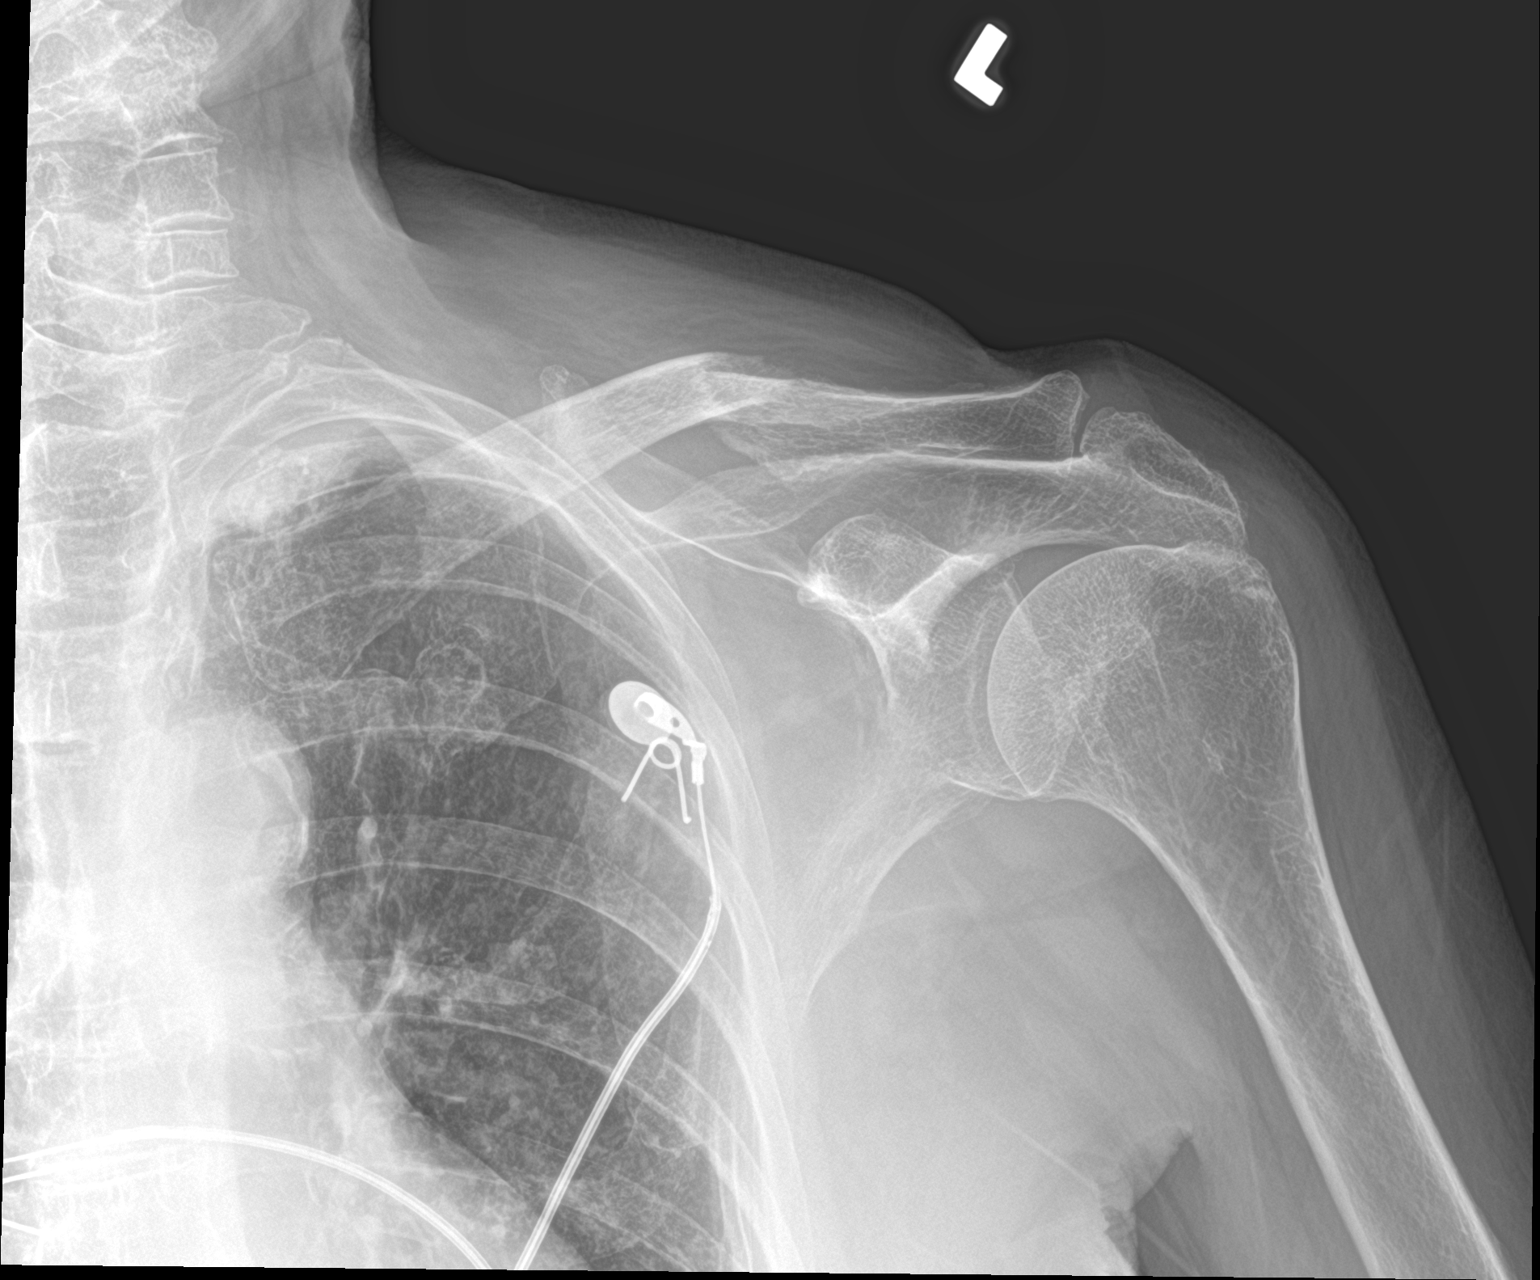

[shoulder y view]
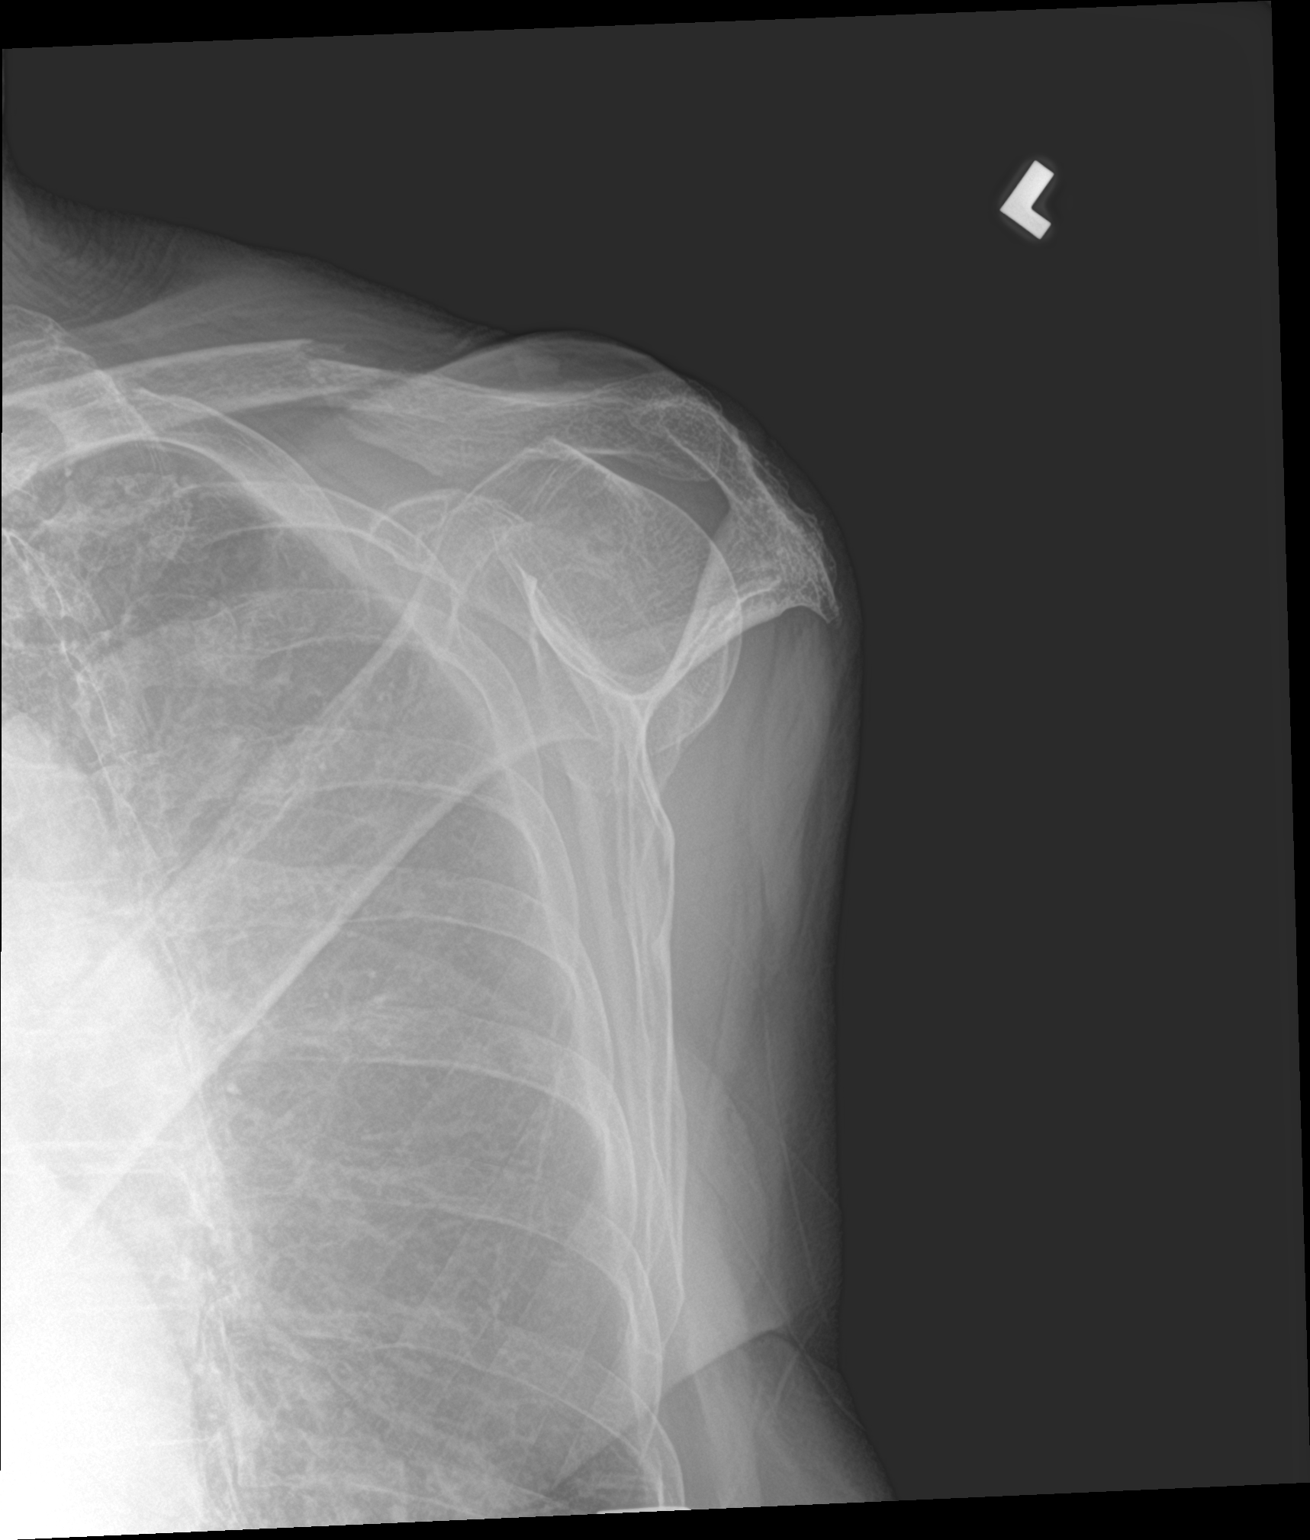

[2 of 2 positions shown; findings below may reference images not displayed]

FINDINGS: There is displaced fracture of the mid left clavicle. There is no
dislocation. The visualized ribs are normal.
IMPRESSION: Displaced fracture of mid left clavicle.

## 2020-08-07 IMAGING — DX DG HIP (WITH OR WITHOUT PELVIS) 2-3V*L*
3 series · 3 of 3 positions shown · non-contrast
Comparison: None.

CLINICAL DATA: Status post fall with left hip pain.

EXAM:
DG HIP (WITH OR WITHOUT PELVIS) 2-3V LEFT

[pelvis ap]
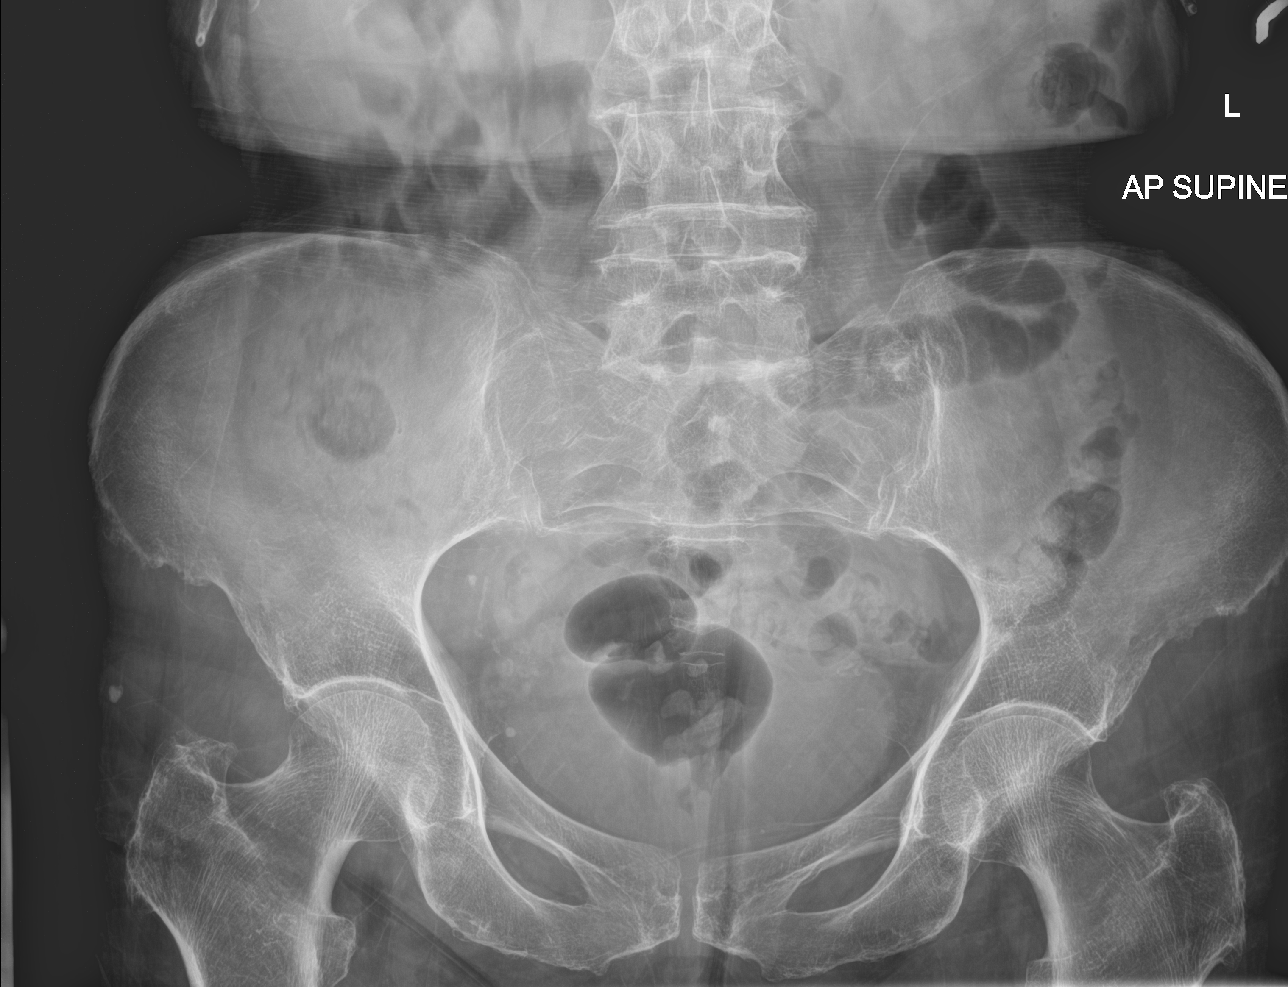

[hip ap]
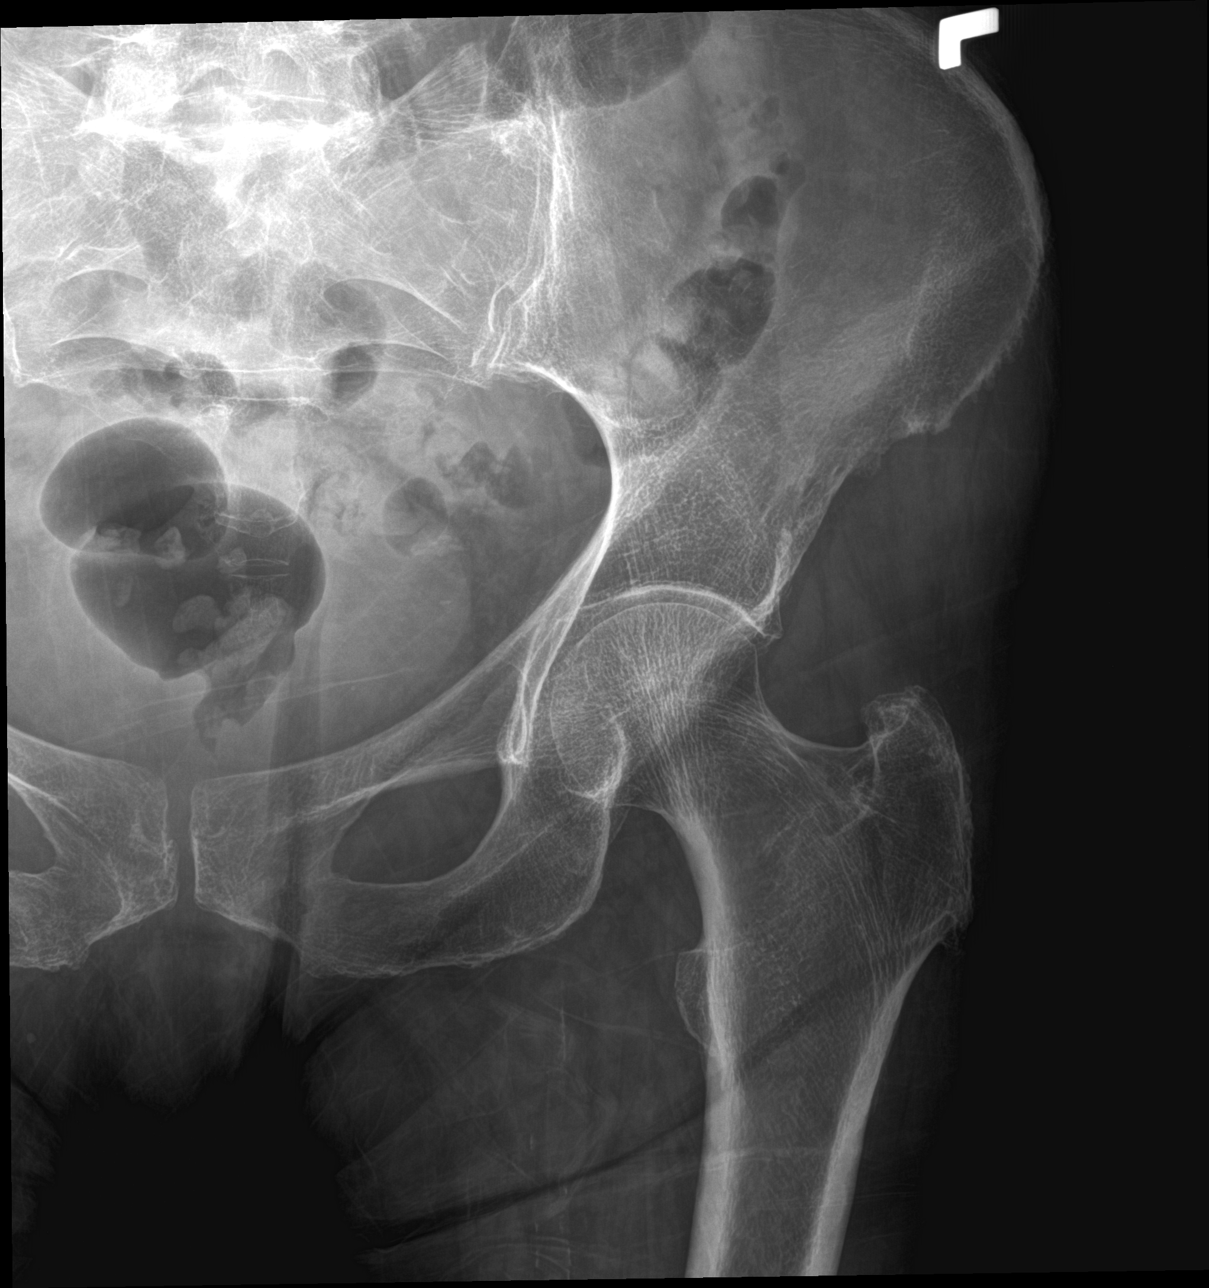

[hip lat]
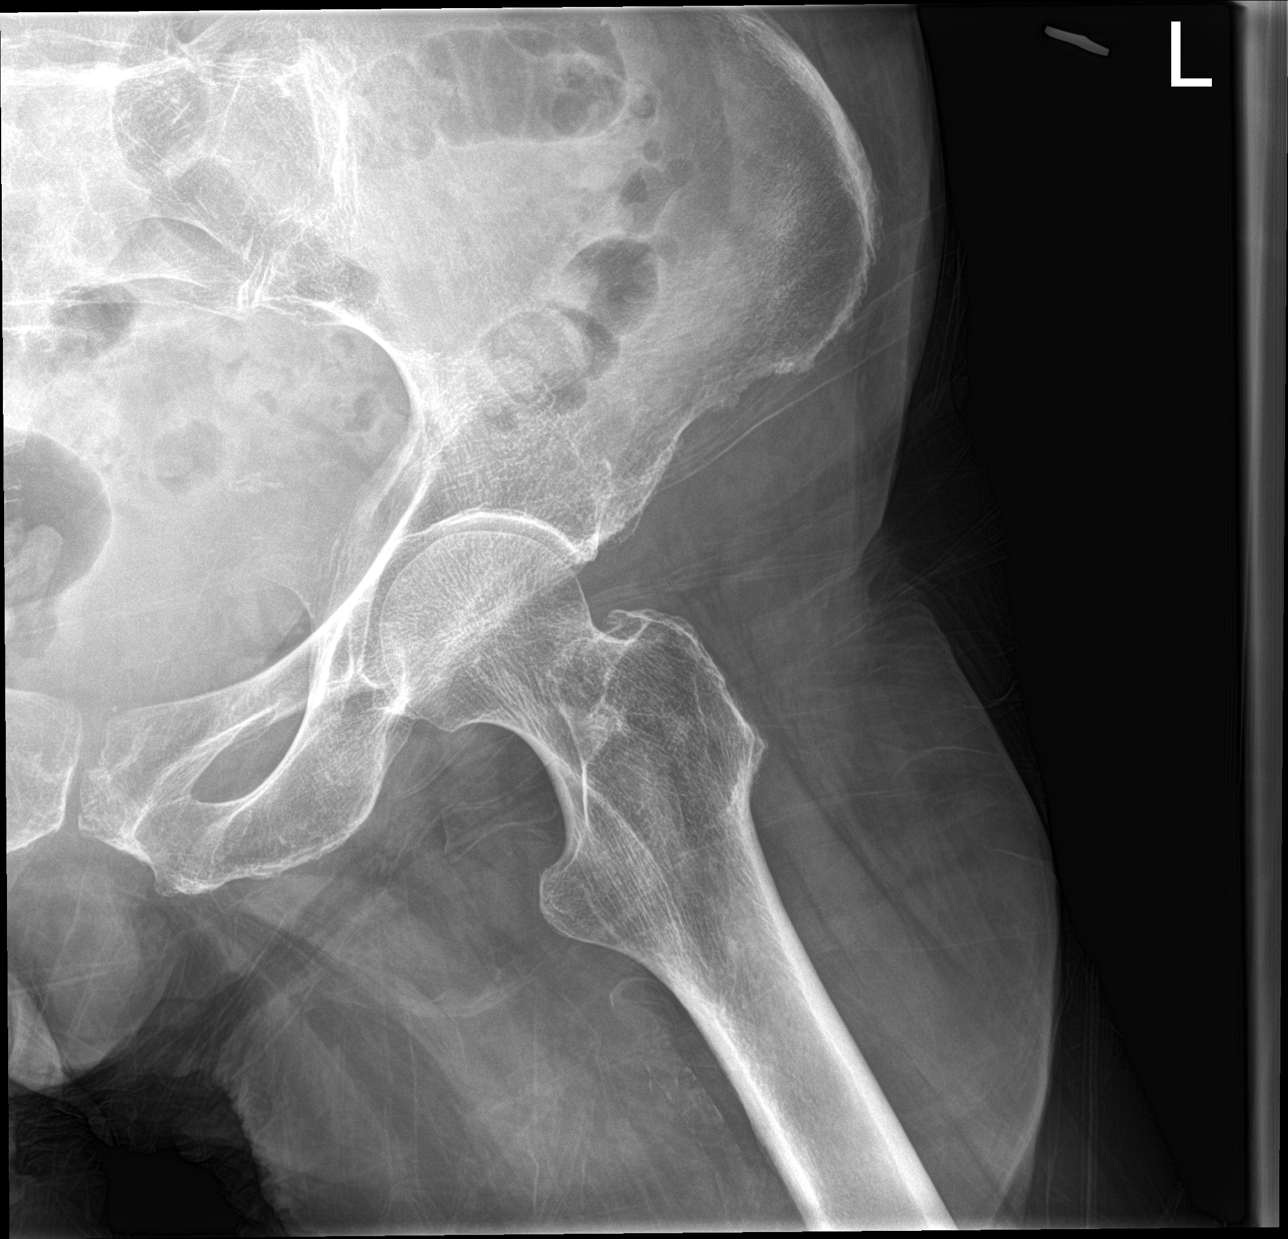

[3 of 3 positions shown; findings below may reference images not displayed]

FINDINGS: There is no evidence of hip fracture or dislocation. Mild narrow
bilateral hip joint spaces are identified.
IMPRESSION: No acute fracture or dislocation.

## 2020-08-07 IMAGING — CT CT HEAD W/O CM
4 series · 14 of 47 positions shown, 16 images · non-contrast
Comparison: CT head dated [DATE]

CLINICAL DATA: Fall, left head injury

EXAM:
CT HEAD WITHOUT CONTRAST
CT CERVICAL SPINE WITHOUT CONTRAST
TECHNIQUE: Multidetector CT imaging of the head and cervical spine was
performed following the standard protocol without intravenous
contrast. Multiplanar CT image reconstructions of the cervical spine
were also generated.

[Series 3: head ax w o · axial · 0.32mm/px · z∈[+43,+141]mm · 6 of 30 slices shown, 8 images]
[im 5/30  brain]
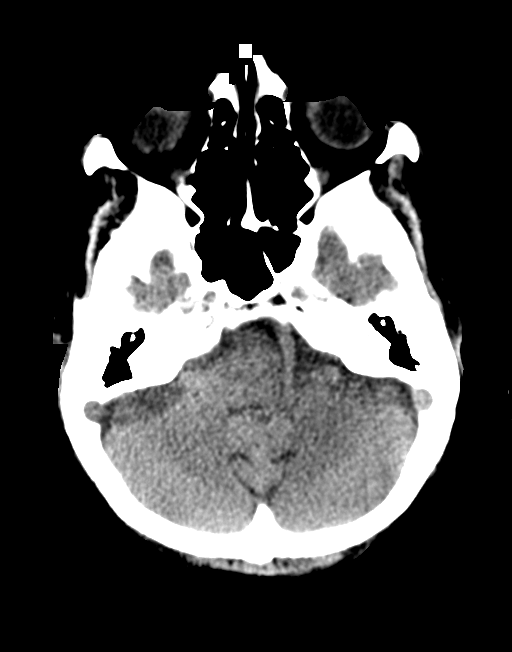
[im 5/30  bone]
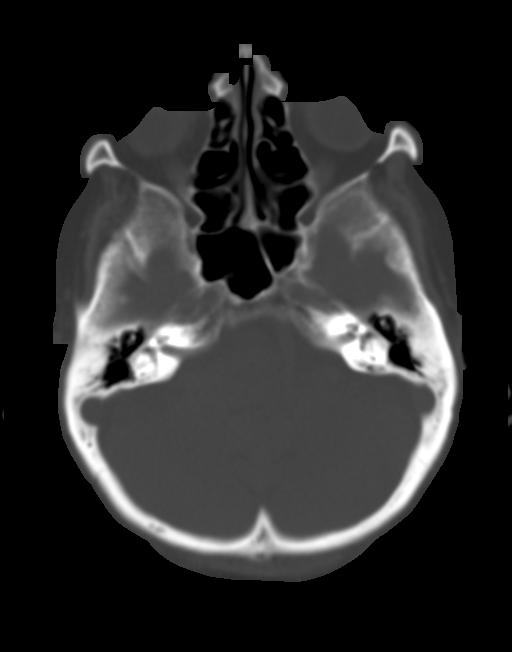
[im 9/30  brain]
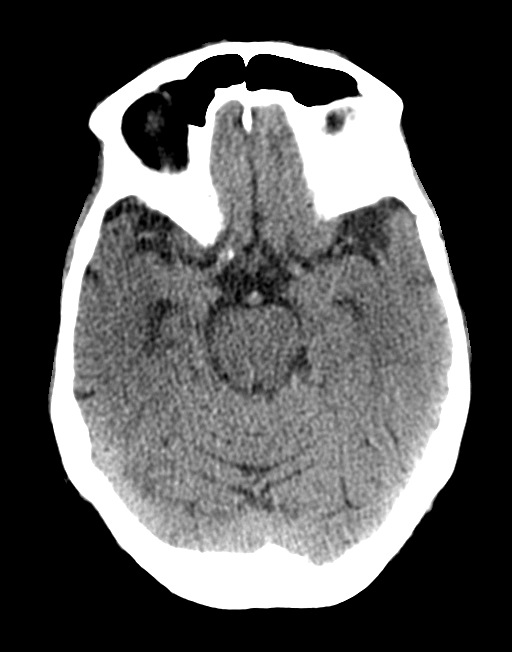
[im 13/30  brain]
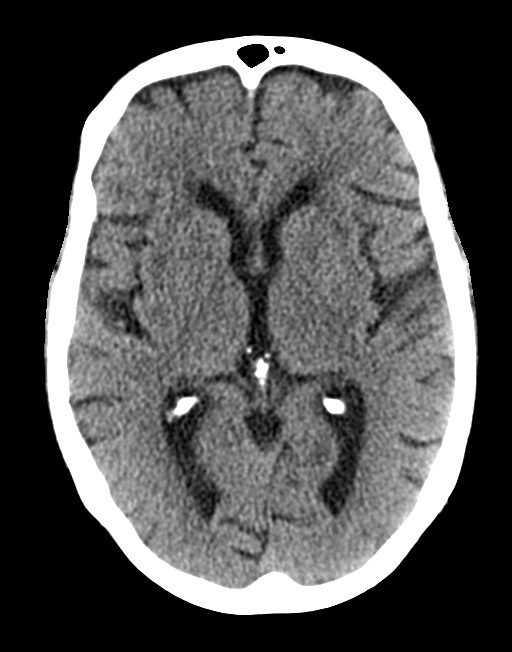
[im 17/30  brain]
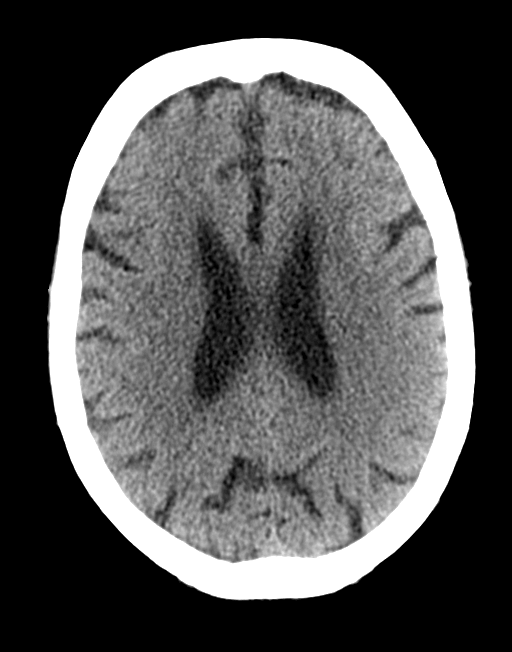
[im 21/30  brain]
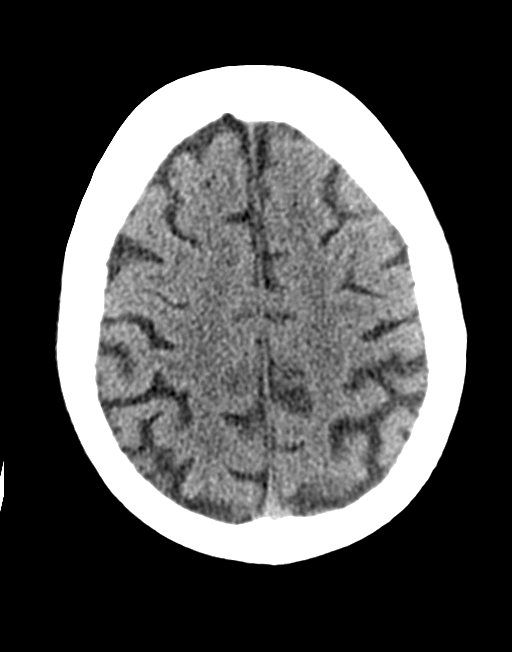
[im 21/30  bone]
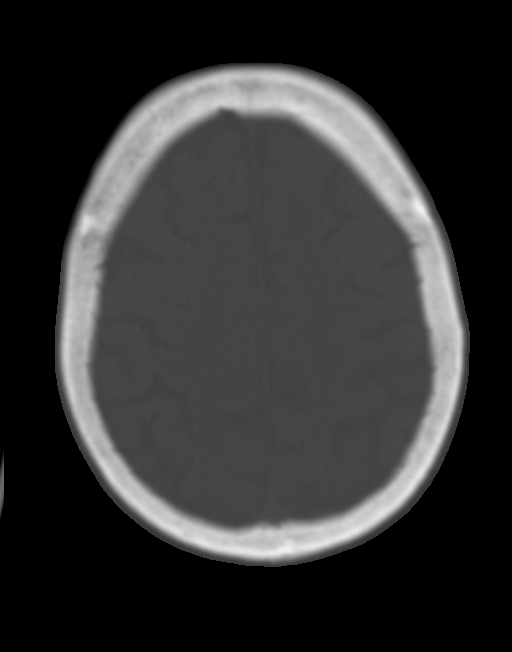
[im 25/30  brain]
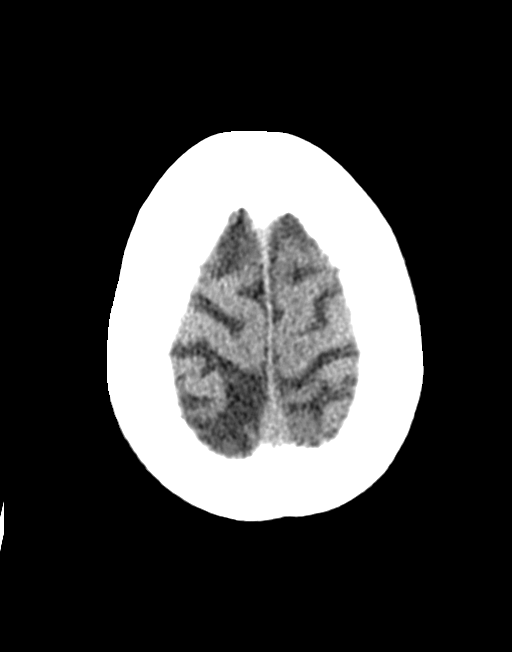

[Series 4: head ax bone · axial · 0.32mm/px · z∈[+22,+38]mm · 2 of 81 slices shown]
[im 8/81  bone]
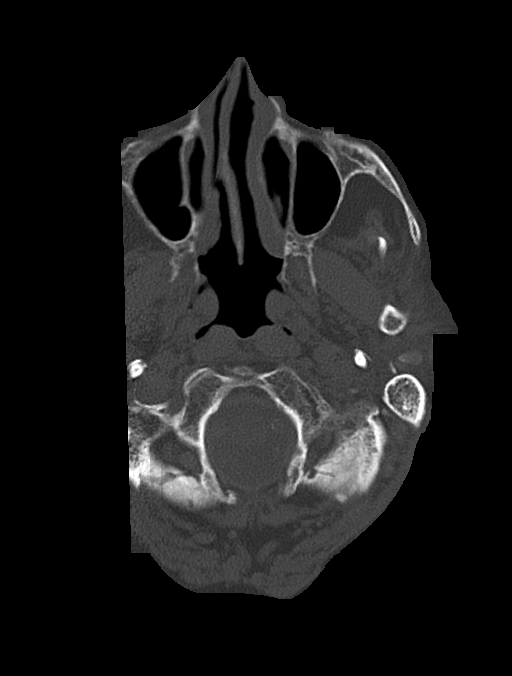
[im 16/81  bone]
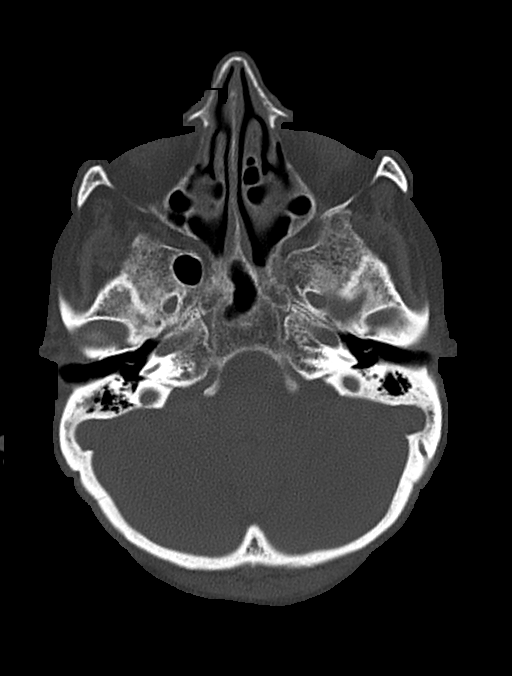

[Series 5: coronal soft · coronal · 0.31mm/px · 3 of 69 slices shown]
[im 23/69  brain]
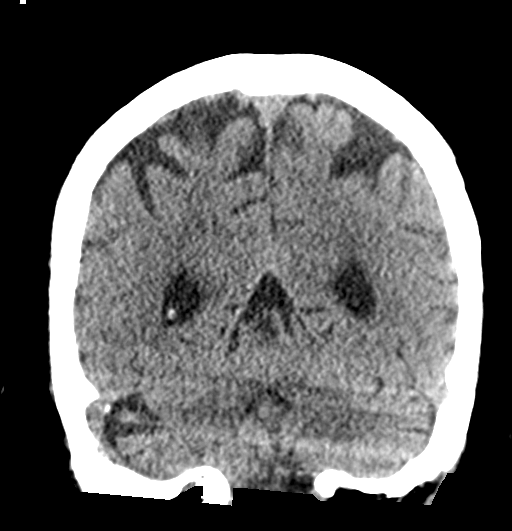
[im 31/69  brain]
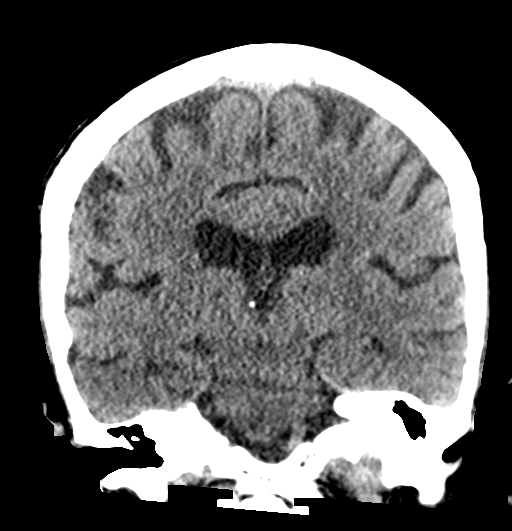
[im 38/69  brain]
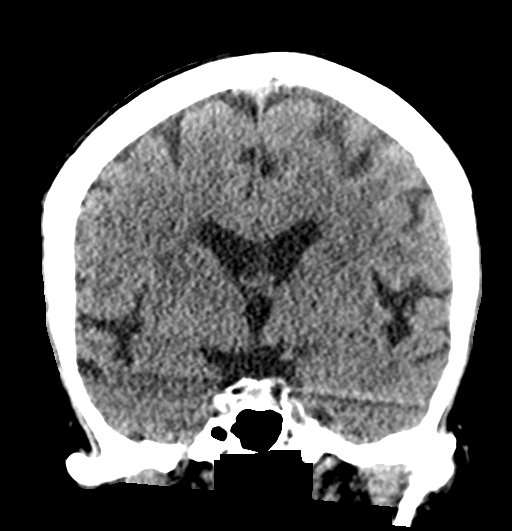

[Series 6: sagittal soft · sagittal · 0.33mm/px · 3 of 57 slices shown]
[im 21/57  brain]
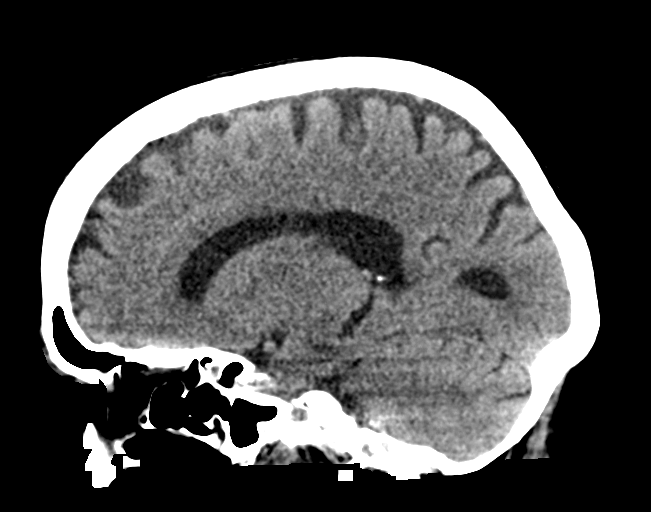
[im 29/57  brain]
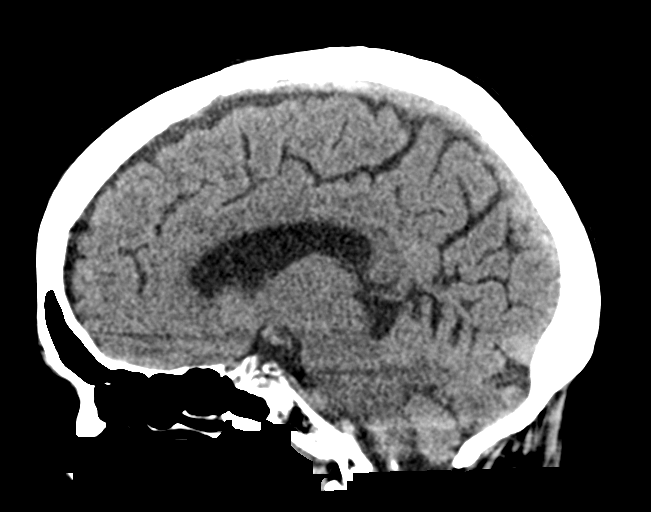
[im 36/57  brain]
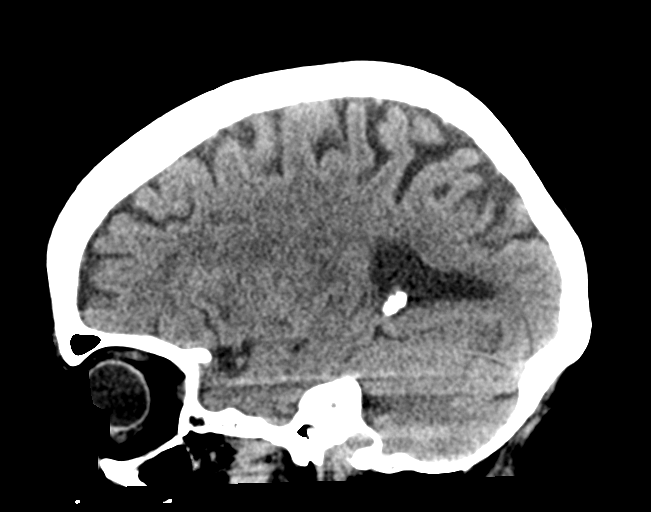

[14 of 47 positions shown; findings below may reference images not displayed]

FINDINGS: CT HEAD FINDINGS

Brain: No evidence of acute infarction, hemorrhage, hydrocephalus,
extra-axial collection or mass lesion/mass effect.

Mild subcortical white matter and periventricular small vessel
ischemic changes.

Vascular: Intracranial atherosclerosis.

Skull: Normal. Negative for fracture or focal lesion.

Sinuses/Orbits: The visualized paranasal sinuses are essentially
clear. The mastoid air cells are unopacified.

Other: None.

CT CERVICAL SPINE FINDINGS

Alignment: Exaggerated cervical lordosis.

Skull base and vertebrae: No acute fracture. No primary bone lesion
or focal pathologic process.

Soft tissues and spinal canal: No prevertebral fluid or swelling. No
visible canal hematoma.

Disc levels: Very mild degenerative changes of the mid cervical
spine. Spinal canal is patent.

Upper chest: Scarring/fibrosis at the bilateral lung apices.

Other: None.
IMPRESSION: No evidence of acute intracranial abnormality. Mild small vessel
ischemic changes.

No evidence of traumatic injury to the cervical spine.

## 2020-08-07 IMAGING — CT CT CERVICAL SPINE W/O CM
3 of 4 series · 12 of 35 positions shown, 14 images · non-contrast
Comparison: CT head dated [DATE]

CLINICAL DATA: Fall, left head injury

EXAM:
CT HEAD WITHOUT CONTRAST
CT CERVICAL SPINE WITHOUT CONTRAST
TECHNIQUE: Multidetector CT imaging of the head and cervical spine was
performed following the standard protocol without intravenous
contrast. Multiplanar CT image reconstructions of the cervical spine
were also generated.

[Series 5: sagittal bone · sagittal · 0.23mm/px · 5 of 61 slices shown, 6 images]
[im 21/61  bone]
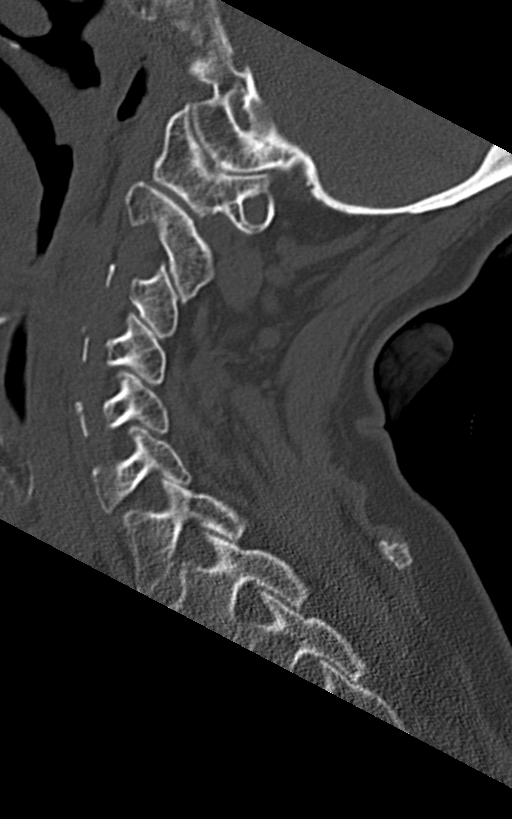
[im 26/61  bone]
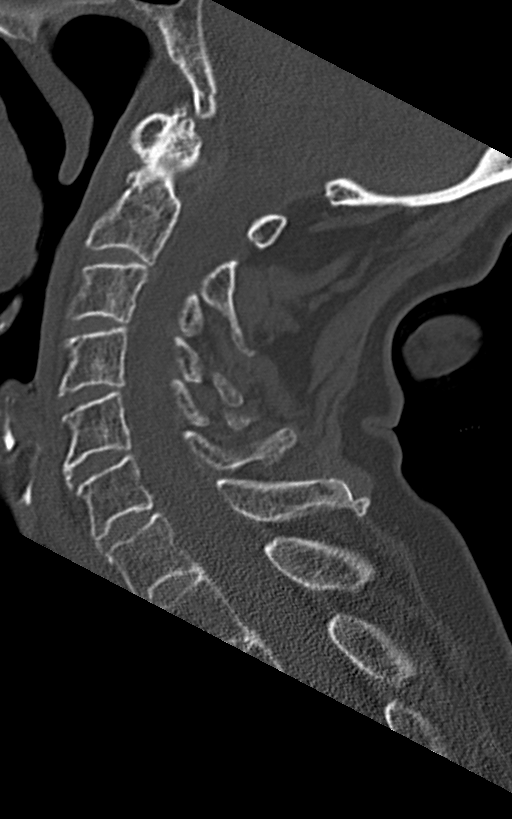
[im 31/61  soft-tissue]
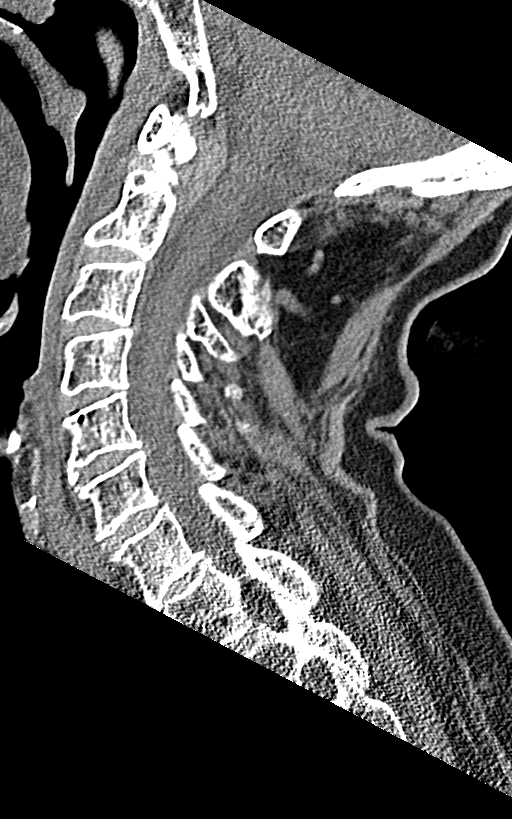
[im 31/61  bone]
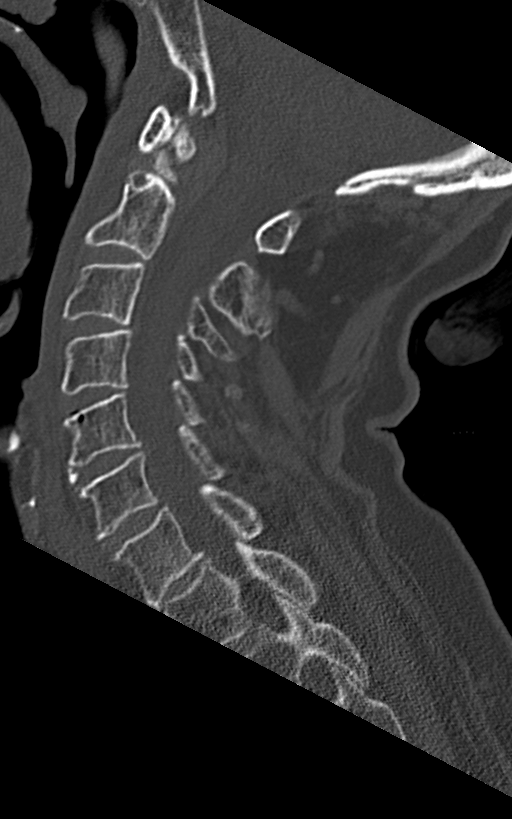
[im 36/61  bone]
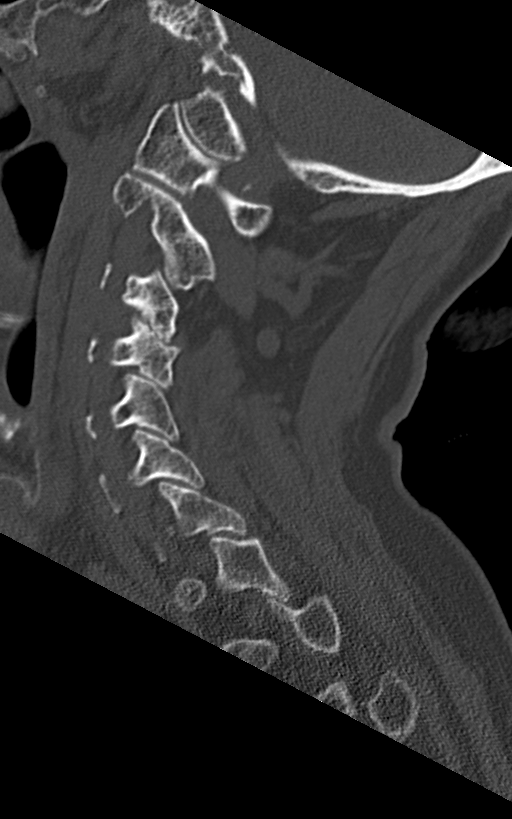
[im 41/61  bone]
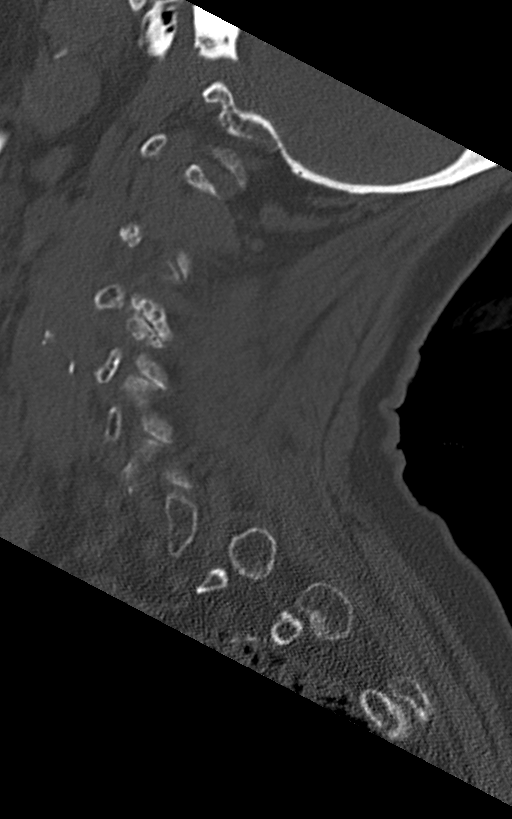

[Series 6: coronal bone · coronal · 0.23mm/px · 3 of 61 slices shown]
[im 13/61  bone]
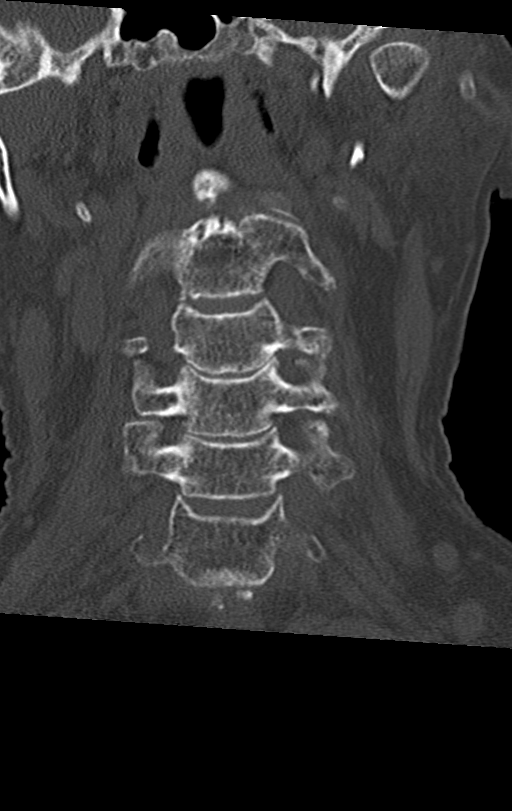
[im 25/61  bone]
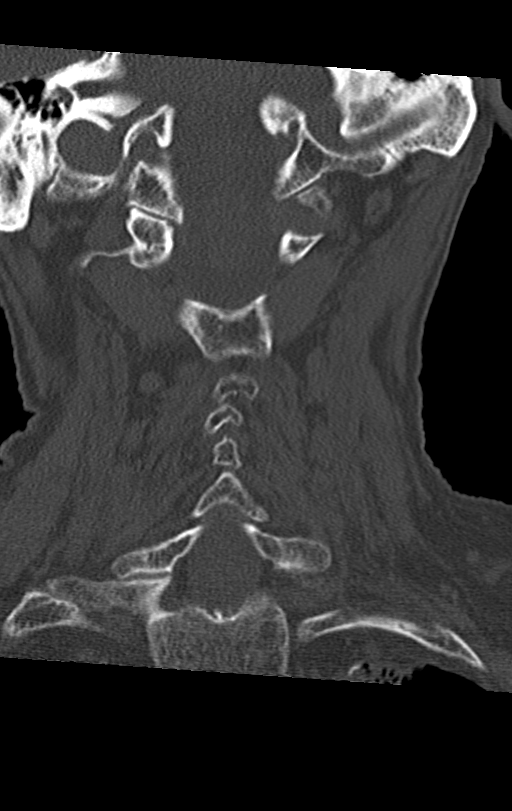
[im 37/61  bone]
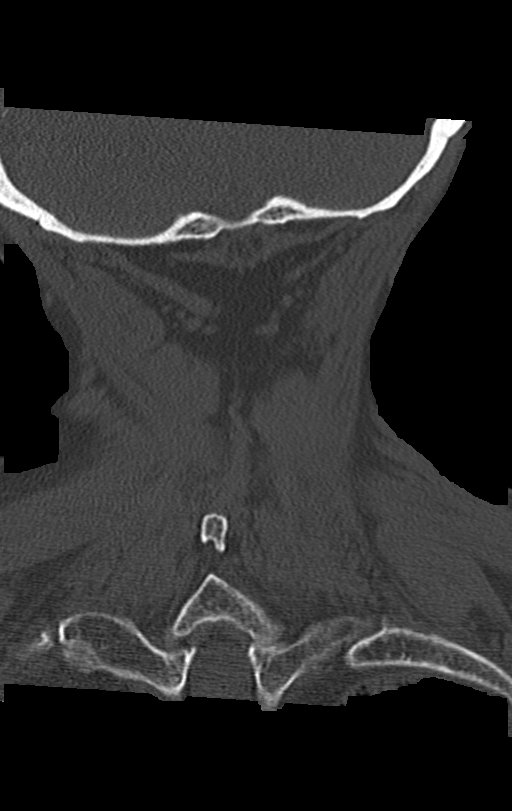

[Series 7: orthogonal axials · axial · 0.21mm/px · z∈[-101,+0]mm · 4 of 95 slices shown, 5 images]
[im 14/95  soft-tissue]
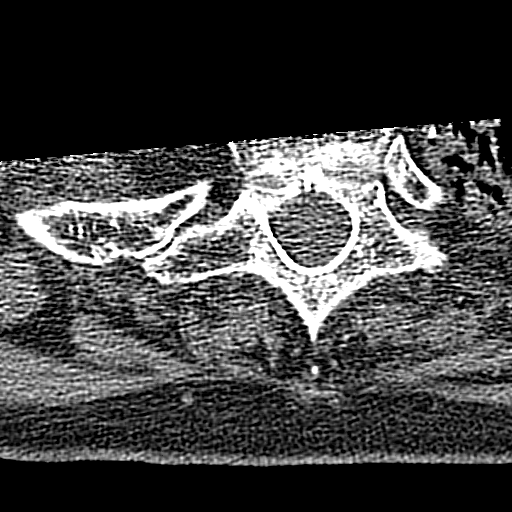
[im 14/95  bone]
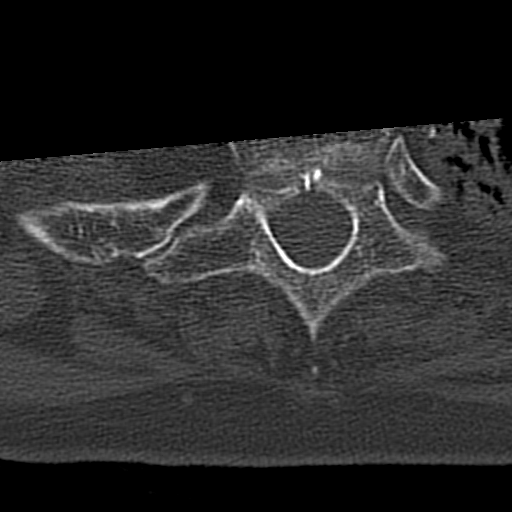
[im 41/95  bone]
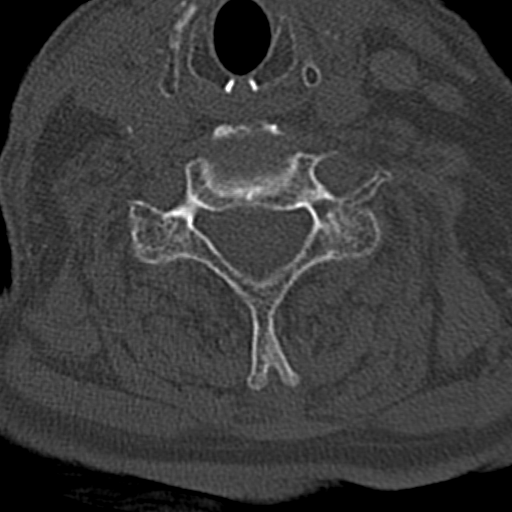
[im 54/95  bone]
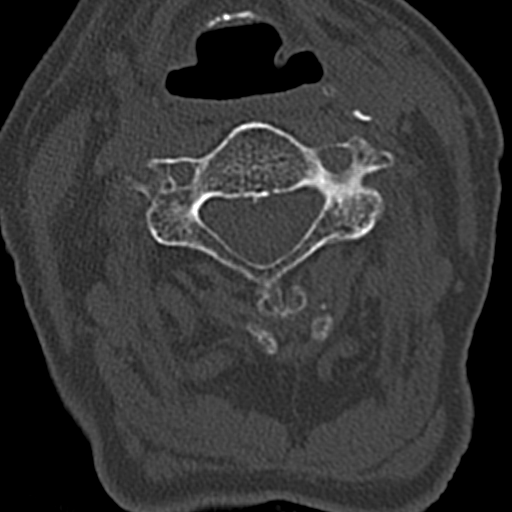
[im 81/95  bone]
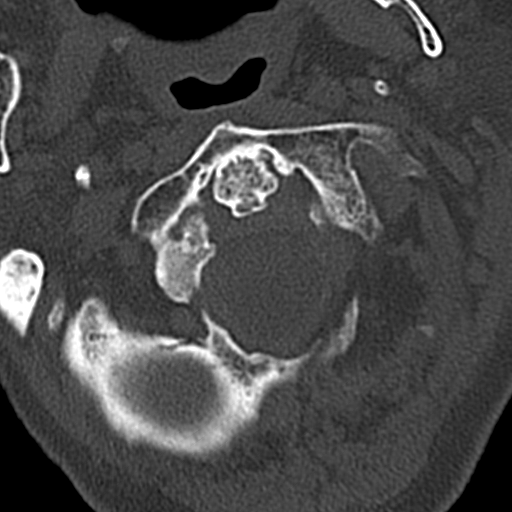

[12 of 35 positions shown; findings below may reference images not displayed]

FINDINGS: CT HEAD FINDINGS

Brain: No evidence of acute infarction, hemorrhage, hydrocephalus,
extra-axial collection or mass lesion/mass effect.

Mild subcortical white matter and periventricular small vessel
ischemic changes.

Vascular: Intracranial atherosclerosis.

Skull: Normal. Negative for fracture or focal lesion.

Sinuses/Orbits: The visualized paranasal sinuses are essentially
clear. The mastoid air cells are unopacified.

Other: None.

CT CERVICAL SPINE FINDINGS

Alignment: Exaggerated cervical lordosis.

Skull base and vertebrae: No acute fracture. No primary bone lesion
or focal pathologic process.

Soft tissues and spinal canal: No prevertebral fluid or swelling. No
visible canal hematoma.

Disc levels: Very mild degenerative changes of the mid cervical
spine. Spinal canal is patent.

Upper chest: Scarring/fibrosis at the bilateral lung apices.

Other: None.
IMPRESSION: No evidence of acute intracranial abnormality. Mild small vessel
ischemic changes.

No evidence of traumatic injury to the cervical spine.

## 2020-08-07 IMAGING — DX DG CHEST 1V
1 series · 1 of 1 positions shown · non-contrast
Comparison: [DATE]

CLINICAL DATA: Status post fall with pain on the left side.

EXAM:
CHEST  1 VIEW

[chest ap]
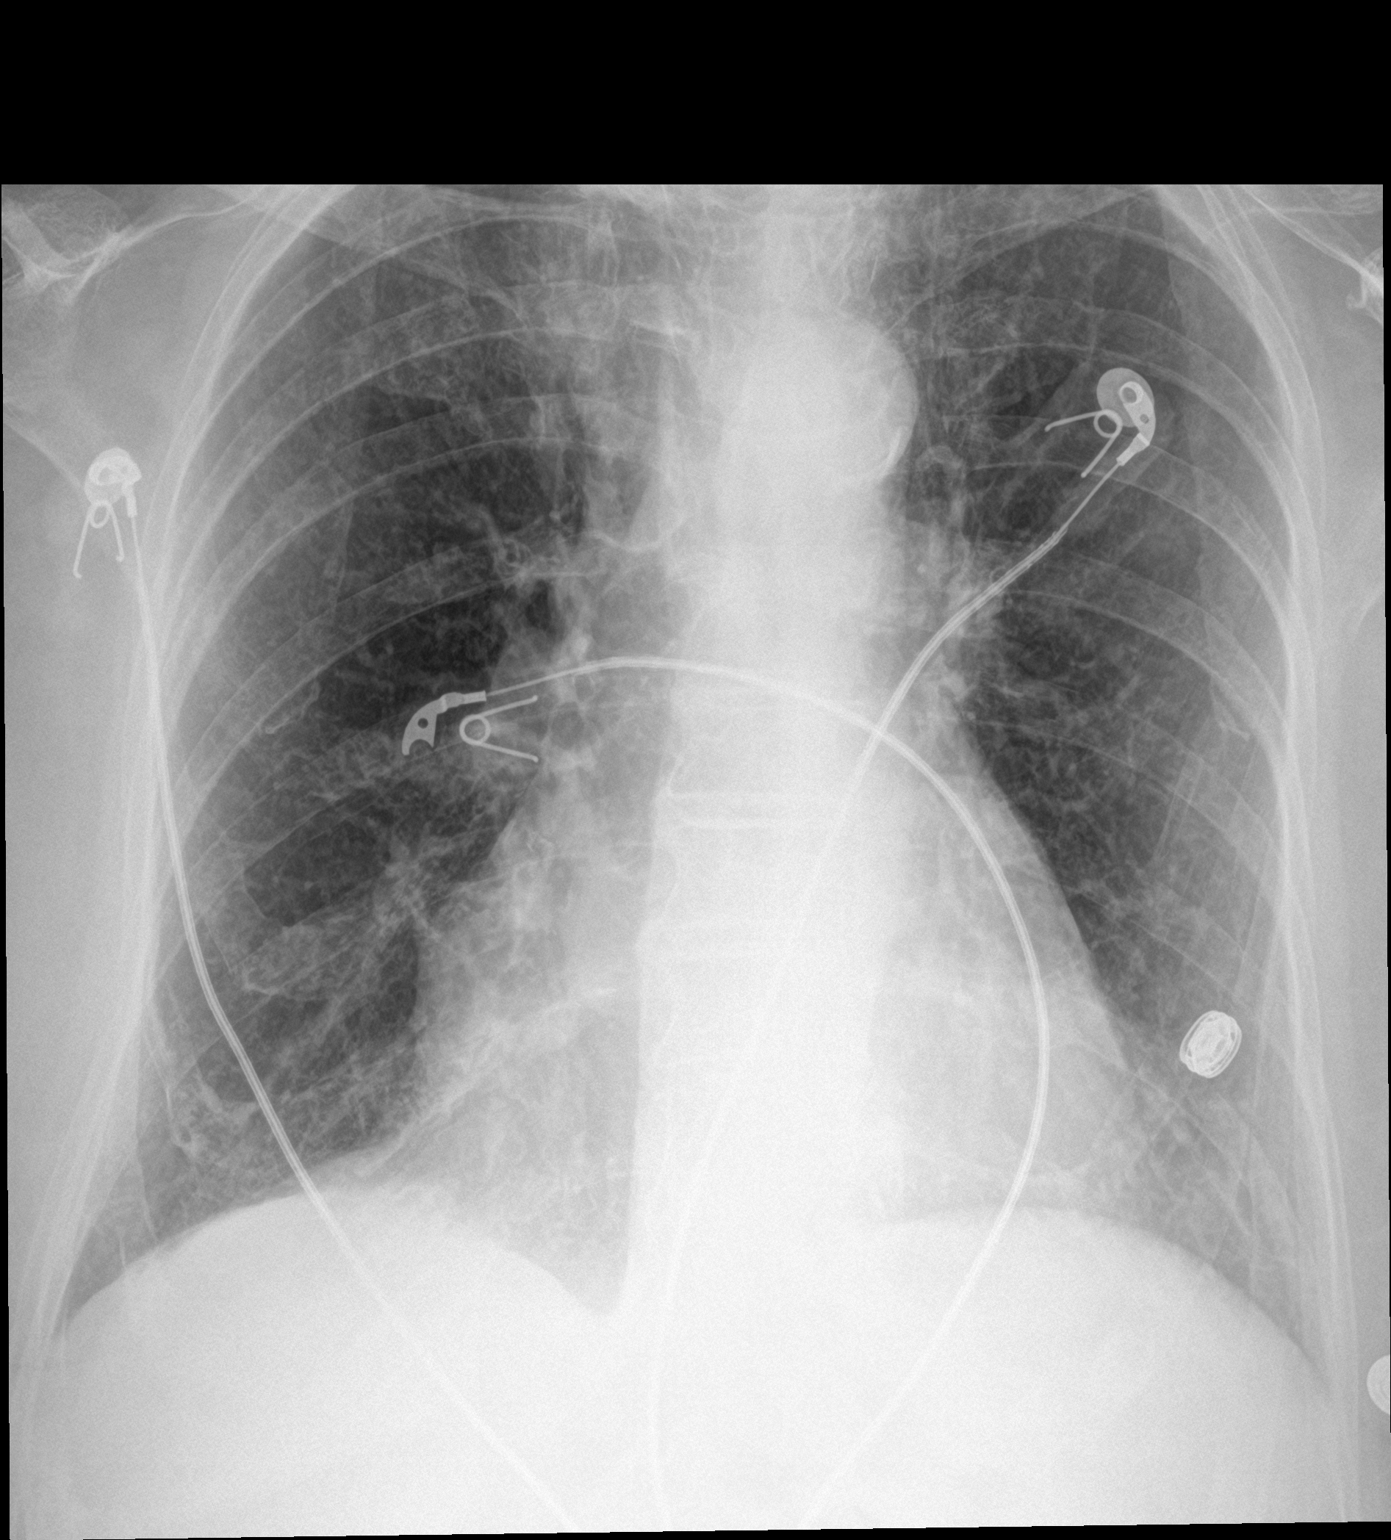

[1 of 1 positions shown; findings below may reference images not displayed]

FINDINGS: The heart size and mediastinal contours are within normal limits.
Both lungs are clear. There is fracture of the left clavicle.
IMPRESSION: No active cardiopulmonary disease.  Fracture of left clavicle.

## 2020-08-07 MED ORDER — ACETAMINOPHEN 325 MG PO TABS
650.0000 mg | ORAL_TABLET | Freq: Once | ORAL | Status: AC
  Administered 2020-08-07: 650 mg via ORAL
  Filled 2020-08-07: qty 2

## 2020-08-07 NOTE — ED Notes (Signed)
Pt tripped at home and fell, hit head on floor, knot noted to left temporal.  Pt c/o left shoulder pain as well.

## 2020-08-07 NOTE — ED Triage Notes (Signed)
Pt tripped over bottles of water this am and landed of her left side. Pt report left shoulder pain and has a hematoma to left temporal area. No loss of consciousness

## 2020-08-07 NOTE — ED Provider Notes (Signed)
[facility] EMERGENCY DEPARTMENT Provider Note   CSN: [id number] Arrival date & time: 08/07/20  1119     History Chief Complaint  Patient presents with  . Fall  . Shoulder Pain    Crystal Browning is a 87 y.o. female.  HPI 87 year old female presents after a fall. She tripped over a water bottle. Injured her left shoulder and hit her left head. Endorses some neck pain as well. She is on Eliquis. Pain is moderate. No lower extremity injury. Some pleuritic pain.   Past Medical History:  Diagnosis Date  . Arthritis   . Bilateral lower extremity edema 10/30/2016  . 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 Depression    History of Depression- Lexapro therapy  . Diverticulosis 02/2005   History of diverticulosis with admission from 8/18 - 03/04/2005  . Esophageal stricture 03/03/2018  . Generalized abdominal pain 07/17/2015  . History of chemotherapy    S/P chemotherapy with Dr. Sonny Dandy -2006  . Hypertension   . Hypothyroid   . Osteoporosis    History of Osteoporosis with one year of Actonel only  . Radiation    S/P radiation thrapy -06/2005  . TIA (transient ischemic attack)    History of TIA's with no residual effect    Patient Active Problem List   Diagnosis Date Noted  . CAP (community acquired pneumonia) 10/04/2019  . Pharyngoesophageal dysphagia 01/14/2018  . Long term (current) use of anticoagulants 12/14/2016  . Atrial fibrillation ([place]) 10/30/2016  . Other fatigue 10/30/2016  . Venous insufficiency of both lower extremities 10/30/2016  . History of external beam radiation therapy 08/20/2016  . History of laryngeal cancer 08/20/2016  . Xerostomia 08/20/2016  . Postmenopausal 07/17/2015  . Thyroid activity decreased 07/17/2015  . Essential hypertension 07/12/2015  . Angina pectoris ([employer]) 07/12/2015  . Vitamin D deficiency 07/12/2015  . Other and combined forms of senile cataract  09/03/2013    Past Surgical History:  Procedure Laterality Date  . CARDIOVERSION N/A 11/23/2016   Procedure: CARDIOVERSION;  Surgeon: Pixie Casino, MD;  Location: [facility] ENDOSCOPY;  Service: Cardiovascular;  Laterality: N/A;  . CATARACT EXTRACTION, BILATERAL    . DIRECT LARYNGOSCOPY  03/27/2005   S/P direct laryngoscopy, esophagoscopy and biopsy with Dr. Constance Holster  . TOTAL ABDOMINAL HYSTERECTOMY W/ BILATERAL SALPINGOOPHORECTOMY  1986  . TUBAL LIGATION       OB History   No obstetric history on file.     Family History  Problem Relation Age of Onset  . Stroke Mother   . Heart disease Mother 35  . Cancer Father 91       Prostate  . Alcohol abuse Brother   . COPD Brother     Social History   Tobacco Use  . Smoking status: Never Smoker  . Smokeless tobacco: Never Used  Vaping Use  . Vaping Use: Never used  Substance Use Topics  . Alcohol use: No  . Drug use: No    Home Medications Prior to Admission medications   Medication Sig Start Date End Date Taking? Authorizing Provider  acetaminophen (TYLENOL) 325 MG tablet Take 2 tablets (650 mg total) by mouth every 6 (six) hours as needed for mild pain, fever or headache. 10/07/19   Roxan Hockey, MD  apixaban (ELIQUIS) 2.5 MG TABS tablet Take 1 tablet (2.5 mg total) by mouth 2 (two) times daily. 07/11/20   [name], MD  azithromycin (  ZITHROMAX) 250 MG tablet Take 1 tablet (250 mg total) by mouth daily. Take first 2 tablets together, then 1 every day until finished. 07/20/20   Avegno, Darrelyn Hillock, FNP  B Complex-Folic Acid (B COMPLEX-VITAMIN B12 PO) Take by mouth.  Patient not taking: Reported on 07/11/2020    [provider]  escitalopram (LEXAPRO) 10 MG tablet Take 1 tablet (10 mg total) by mouth daily. 07/20/20   [name], MD  famotidine (PEPCID) 20 MG tablet Take 1 tablet (20 mg total) by mouth every 12 (twelve) hours. 03/30/20   [name], MD  levothyroxine (SYNTHROID) 50 MCG tablet Take 1 tablet (50 mcg  total) by mouth daily. 07/11/20   [name], MD  loratadine (CLARITIN) 5 MG/5ML syrup Take by mouth daily.    [provider]  metoprolol succinate (TOPROL-XL) 100 MG 24 hr tablet Take with or immediately following a meal. 03/30/20   [name], MD  polyethylene glycol powder (GLYCOLAX/MIRALAX) 17 GM/SCOOP powder Take 17 g by mouth 2 (two) times daily. For constipation 10/07/19   Roxan Hockey, MD  QUEtiapine (SEROQUEL) 25 MG tablet Take 1 tablet (25 mg total) by mouth at bedtime. 07/22/20   [name], MD    Allergies    Amlodipine besylate, Tuberculin tests, Vit d-vit e-safflower oil, Claritin [loratadine], and Paroxetine hcl  Review of Systems   Review of Systems  Cardiovascular: Positive for chest pain.  Musculoskeletal: Positive for arthralgias and neck pain.  Neurological: Positive for headaches.    Physical Exam Updated Vital Signs BP 135/72   Pulse 72   Temp 97.7 F (36.5 C) (Oral)   Resp (!) 23   Ht 5\' 1"  (1.549 m)   Wt 56 kg   SpO2 98%   BMI 23.33 kg/m   Physical Exam Vitals and nursing note reviewed.  Constitutional:      Appearance: She is well-developed and well-nourished.  HENT:     Head: Normocephalic. Contusion present.      Right Ear: External ear normal.     Left Ear: External ear normal.     Nose: Nose normal.  Eyes:     General:        Right eye: No discharge.        Left eye: No discharge.  Cardiovascular:     Rate and Rhythm: Normal rate and regular rhythm.     Pulses:          Radial pulses are 2+ on the left side.     Heart sounds: Normal heart sounds.  Pulmonary:     Effort: Pulmonary effort is normal.     Breath sounds: Normal breath sounds.     Comments: Tenderness and swelling without tenting over left clavicle Chest:     Chest wall: Tenderness present.  Abdominal:     Palpations: Abdomen is soft.     Tenderness: There is no abdominal tenderness.  Musculoskeletal:     Left shoulder: Tenderness present. No  deformity.     Left upper arm: No swelling or tenderness.     Left elbow: Normal range of motion. No tenderness.     Cervical back: Tenderness present.     Left hip: Tenderness (mild) present. No deformity. Normal range of motion.     Left knee: Normal range of motion. No tenderness.     Comments: Normal strength/sensation in left hand  Skin:    General: Skin is warm and dry.  Neurological:     Mental Status: She is alert.  Psychiatric:        Mood and Affect: Mood is not anxious.     ED Results / Procedures / Treatments   Labs (all labs ordered are listed, but only abnormal results are displayed) Labs Reviewed - No data to display  EKG None  Radiology DG Chest 1 View  Result Date: 08/07/2020 CLINICAL DATA:  Status post fall with pain on the left side. EXAM: CHEST  1 VIEW COMPARISON:  July 18, 2020 FINDINGS: The heart size and mediastinal contours are within normal limits. Both lungs are clear. There is fracture of the left clavicle. IMPRESSION: No active cardiopulmonary disease.  Fracture of left clavicle. Electronically Signed   By: Abelardo Diesel M.D.   On: 08/07/2020 13:00   DG Clavicle Left  Result Date: 08/07/2020 CLINICAL DATA:  Status post fall with left shoulder pain. EXAM: LEFT CLAVICLE - 2+ VIEWS COMPARISON:  None. FINDINGS: Displaced fracture of the mid left clavicle is identified. There is no dislocation. IMPRESSION: Displaced fracture of mid left clavicle. Electronically Signed   By: Abelardo Diesel M.D.   On: 08/07/2020 12:13   CT Head Wo Contrast  Result Date: 08/07/2020 CLINICAL DATA:  Fall, left head injury EXAM: CT HEAD WITHOUT CONTRAST CT CERVICAL SPINE WITHOUT CONTRAST TECHNIQUE: Multidetector CT imaging of the head and cervical spine was performed following the standard protocol without intravenous contrast. Multiplanar CT image reconstructions of the cervical spine were also generated. COMPARISON:  CT head dated 05/30/2020 FINDINGS: CT HEAD FINDINGS Brain: No  evidence of acute infarction, hemorrhage, hydrocephalus, extra-axial collection or mass lesion/mass effect. Mild subcortical [place] matter and periventricular small vessel ischemic changes. Vascular: Intracranial atherosclerosis. Skull: Normal. Negative for fracture or focal lesion. Sinuses/Orbits: The visualized paranasal sinuses are essentially clear. The mastoid air cells are unopacified. Other: None. CT CERVICAL SPINE FINDINGS Alignment: Exaggerated cervical lordosis. Skull base and vertebrae: No acute fracture. No primary bone lesion or focal pathologic process. Soft tissues and spinal canal: No prevertebral fluid or swelling. No visible canal hematoma. Disc levels: Very mild degenerative changes of the mid cervical spine. Spinal canal is patent. Upper chest: Scarring/fibrosis at the bilateral lung apices. Other: None. IMPRESSION: No evidence of acute intracranial abnormality. Mild small vessel ischemic changes. No evidence of traumatic injury to the cervical spine. Electronically Signed   By: Julian Hy M.D.   On: 08/07/2020 12:41   CT Cervical Spine Wo Contrast  Result Date: 08/07/2020 CLINICAL DATA:  Fall, left head injury EXAM: CT HEAD WITHOUT CONTRAST CT CERVICAL SPINE WITHOUT CONTRAST TECHNIQUE: Multidetector CT imaging of the head and cervical spine was performed following the standard protocol without intravenous contrast. Multiplanar CT image reconstructions of the cervical spine were also generated. COMPARISON:  CT head dated 05/30/2020 FINDINGS: CT HEAD FINDINGS Brain: No evidence of acute infarction, hemorrhage, hydrocephalus, extra-axial collection or mass lesion/mass effect. Mild subcortical [place] matter and periventricular small vessel ischemic changes. Vascular: Intracranial atherosclerosis. Skull: Normal. Negative for fracture or focal lesion. Sinuses/Orbits: The visualized paranasal sinuses are essentially clear. The mastoid air cells are unopacified. Other: None. CT CERVICAL SPINE  FINDINGS Alignment: Exaggerated cervical lordosis. Skull base and vertebrae: No acute fracture. No primary bone lesion or focal pathologic process. Soft tissues and spinal canal: No prevertebral fluid or swelling. No visible canal hematoma. Disc levels: Very mild degenerative changes of the mid cervical spine. Spinal canal is patent. Upper chest: Scarring/fibrosis at the bilateral lung apices. Other: None. IMPRESSION: No evidence of acute intracranial abnormality. Mild small vessel ischemic changes.  No evidence of traumatic injury to the cervical spine. Electronically Signed   By: Julian Hy M.D.   On: 08/07/2020 12:41   DG Shoulder Left  Result Date: 08/07/2020 CLINICAL DATA:  Status post fall with left shoulder pain. EXAM: LEFT SHOULDER - 2+ VIEW COMPARISON:  None. FINDINGS: There is displaced fracture of the mid left clavicle. There is no dislocation. The visualized ribs are normal. IMPRESSION: Displaced fracture of mid left clavicle. Electronically Signed   By: Abelardo Diesel M.D.   On: 08/07/2020 12:13   DG Hip Unilat W or Wo Pelvis 2-3 Views Left  Result Date: 08/07/2020 CLINICAL DATA:  Status post fall with left hip pain. EXAM: DG HIP (WITH OR WITHOUT PELVIS) 2-3V LEFT COMPARISON:  None. FINDINGS: There is no evidence of hip fracture or dislocation. Mild narrow bilateral hip joint spaces are identified. IMPRESSION: No acute fracture or dislocation. Electronically Signed   By: Abelardo Diesel M.D.   On: 08/07/2020 14:29    Procedures Procedures (including critical care time)  Medications Ordered in ED Medications  acetaminophen (TYLENOL) tablet 650 mg (650 mg Oral Given 08/07/20 1247)    ED Course  I have reviewed the triage vital signs and the nursing notes.  Pertinent labs & imaging results that were available during my care of the patient were reviewed by me and considered in my medical decision making (see chart for details).    MDM Rules/Calculators/A&P                           Patient presents with a trip and fall.  Initial x-rays show closed clavicle fracture.  Neurovascular intact.  CT head and cervical spine are also benign.  After initial evaluation, patient's daughter indicates she also had some left hip pain.  This appears to be mild so an x-ray was obtained and is negative.  She is able to ambulate without difficulty so I highly doubt occult fracture.  She did declined anything stronger than Tylenol for pain.  We will put in sling and have her follow-up with orthopedics. Final Clinical Impression(s) / ED Diagnoses Final diagnoses:  Displaced fracture of shaft of left clavicle, initial encounter for closed fracture  Minor head injury, initial encounter    Rx / DC Orders ED Discharge Orders    None       Sherwood Gambler, MD 08/07/20 1453

## 2020-08-09 ENCOUNTER — Other Ambulatory Visit: Payer: Self-pay

## 2020-08-09 ENCOUNTER — Encounter: Payer: Self-pay | Admitting: Orthopedic Surgery

## 2020-08-09 ENCOUNTER — Ambulatory Visit (INDEPENDENT_AMBULATORY_CARE_PROVIDER_SITE_OTHER): Payer: Medicare Other | Admitting: Orthopedic Surgery

## 2020-08-09 VITALS — BP 183/86 | HR 82 | Ht 61.0 in | Wt 124.0 lb

## 2020-08-09 DIAGNOSIS — S42022A Displaced fracture of shaft of left clavicle, initial encounter for closed fracture: Secondary | ICD-10-CM | POA: Diagnosis not present

## 2020-08-09 NOTE — Progress Notes (Signed)
New Patient Visit  Assessment: Crystal Browning is a 87 y.o. female with the following: Left clavicle fracture; treat nonop in a sling  Plan: Wear sling as needed.  Ok to remove if relaxing at home.  Can shower, but recommend keeping left arm at side.  Always wear sling out of the house. No lifting anything heavier than a coffee cup.  Ok to use shoulder for ADLs, she will limit herself based on her pain. Tylenol as needed for pain As pain subsides, ok to move left arm more frequently.  Limit lifting Follow up in 3-4 weeks for repeat XR  Follow-up: Return for 3-4 weeks.  Subjective:  Chief Complaint  Patient presents with  . Shoulder Injury    Patient reports she had a fall on 08/07/2020. Pt reports 10/10 pain.     History of Present Illness: Crystal Browning is a 87 y.o. female who presents for evaluation of her left shoulder.  She fell 2 days ago and hit her shoulder and her head.  XR demonstrates a midshaft clavicle fracture.  She has been taking tylenol for pain.  No numbness or tingling.  She has been using a sling at all times and sleeping in a recliner.  She lives by herself, but has been staying with family.    Review of Systems: No fevers or chills No numbness or tingling No chest pain No shortness of breath No bowel or bladder dysfunction No GI distress   Medical History:  Past Medical History:  Diagnosis Date  . Arthritis   . Bilateral lower extremity edema 10/30/2016  . Cancer ([place]) 03/28/11   SCCA of the Supraglottic Larynx (T2NoMo)    ; S/P Chemoradiation therapy from 05/09/05 thru 06/29/05  . Depression    History of Depression- Lexapro therapy  . Diverticulosis 02/2005   History of diverticulosis with admission from 8/18 - 03/04/2005  . Esophageal stricture 03/03/2018  . Generalized abdominal pain 07/17/2015  . History of chemotherapy    S/P chemotherapy with Dr. Sonny Dandy -2006  . Hypertension   . Hypothyroid   . Osteoporosis    History of Osteoporosis with one  year of Actonel only  . Radiation    S/P radiation thrapy -06/2005  . TIA (transient ischemic attack)    History of TIA's with no residual effect    Past Surgical History:  Procedure Laterality Date  . CARDIOVERSION N/A 11/23/2016   Procedure: CARDIOVERSION;  Surgeon: Pixie Casino, MD;  Location: [facility] ENDOSCOPY;  Service: Cardiovascular;  Laterality: N/A;  . CATARACT EXTRACTION, BILATERAL    . DIRECT LARYNGOSCOPY  03/27/2005   S/P direct laryngoscopy, esophagoscopy and biopsy with Dr. Constance Holster  . TOTAL ABDOMINAL HYSTERECTOMY W/ BILATERAL SALPINGOOPHORECTOMY  1986  . TUBAL LIGATION      Family History  Problem Relation Age of Onset  . Stroke Mother   . Heart disease Mother 35  . Cancer Father 61       Prostate  . Alcohol abuse Brother   . COPD Brother    Social History   Tobacco Use  . Smoking status: Never Smoker  . Smokeless tobacco: Never Used  Vaping Use  . Vaping Use: Never used  Substance Use Topics  . Alcohol use: No  . Drug use: No    Allergies  Allergen Reactions  . Amlodipine Besylate Hives    Hives and confusion.  . Tuberculin Tests Swelling  . Vit D-Vit E-Safflower Oil   . Claritin [Loratadine] Rash    Pt's  daughter states she is allergic to claritin  . Paroxetine Hcl Rash    Current Meds  Medication Sig  . acetaminophen (TYLENOL) 325 MG tablet Take 2 tablets (650 mg total) by mouth every 6 (six) hours as needed for mild pain, fever or headache.  Marland Kitchen apixaban (ELIQUIS) 2.5 MG TABS tablet Take 1 tablet (2.5 mg total) by mouth 2 (two) times daily.  . B Complex-Folic Acid (B COMPLEX-VITAMIN B12 PO) Take by mouth.  . escitalopram (LEXAPRO) 10 MG tablet Take 1 tablet (10 mg total) by mouth daily.  . famotidine (PEPCID) 20 MG tablet Take 1 tablet (20 mg total) by mouth every 12 (twelve) hours.  Marland Kitchen levothyroxine (SYNTHROID) 50 MCG tablet Take 1 tablet (50 mcg total) by mouth daily.  Marland Kitchen loratadine (CLARITIN) 5 MG/5ML syrup Take by mouth daily.  . metoprolol  succinate (TOPROL-XL) 100 MG 24 hr tablet Take with or immediately following a meal.  . polyethylene glycol powder (GLYCOLAX/MIRALAX) 17 GM/SCOOP powder Take 17 g by mouth 2 (two) times daily. For constipation  . QUEtiapine (SEROQUEL) 25 MG tablet Take 1 tablet (25 mg total) by mouth at bedtime.    Objective: BP (!) 183/86   Pulse 82   Ht 5\' 1"  (1.549 m)   Wt 124 lb (56.2 kg)   BMI 23.43 kg/m   Physical Exam:  General:  Alert and oriented, no acute distress Gait: Normal   Left shoulder in a sling.  Tenderness over the clavicle shaft.  Ecchymosis.  Limited ROM due to pain.  Active motion intact in the AIN/PIN/U nerve distribution.  Sensation intact over axillary patch.    IMAGING: I personally reviewed images previously obtained from the ED   Left clavicle, midshaft fracture.  Minimally displaced.    New Medications:  No orders of the defined types were placed in this encounter.     Mordecai Rasmussen, MD  08/09/2020 9:12 AM

## 2020-08-10 ENCOUNTER — Ambulatory Visit (INDEPENDENT_AMBULATORY_CARE_PROVIDER_SITE_OTHER): Payer: Medicare Other | Admitting: Family Medicine

## 2020-08-10 ENCOUNTER — Ambulatory Visit: Payer: Medicare Other | Admitting: Orthopedic Surgery

## 2020-08-10 ENCOUNTER — Encounter: Payer: Self-pay | Admitting: Family Medicine

## 2020-08-10 VITALS — BP 123/76 | HR 63 | Temp 97.7°F | Ht 61.0 in | Wt 121.8 lb

## 2020-08-10 DIAGNOSIS — R399 Unspecified symptoms and signs involving the genitourinary system: Secondary | ICD-10-CM | POA: Diagnosis not present

## 2020-08-10 DIAGNOSIS — F419 Anxiety disorder, unspecified: Secondary | ICD-10-CM | POA: Diagnosis not present

## 2020-08-10 DIAGNOSIS — S40012A Contusion of left shoulder, initial encounter: Secondary | ICD-10-CM | POA: Diagnosis not present

## 2020-08-10 DIAGNOSIS — Z7901 Long term (current) use of anticoagulants: Secondary | ICD-10-CM | POA: Diagnosis not present

## 2020-08-10 DIAGNOSIS — I4811 Longstanding persistent atrial fibrillation: Secondary | ICD-10-CM

## 2020-08-10 DIAGNOSIS — I10 Essential (primary) hypertension: Secondary | ICD-10-CM

## 2020-08-10 LAB — URINALYSIS, COMPLETE
Bilirubin, UA: NEGATIVE
Glucose, UA: NEGATIVE
Ketones, UA: NEGATIVE
Leukocytes,UA: NEGATIVE
Nitrite, UA: NEGATIVE
Protein,UA: NEGATIVE
RBC, UA: NEGATIVE
Specific Gravity, UA: 1.01 (ref 1.005–1.030)
Urobilinogen, Ur: 0.2 mg/dL (ref 0.2–1.0)
pH, UA: 6 (ref 5.0–7.5)

## 2020-08-10 LAB — MICROSCOPIC EXAMINATION
Bacteria, UA: NONE SEEN
Epithelial Cells (non renal): NONE SEEN /hpf (ref 0–10)
RBC, Urine: NONE SEEN /hpf (ref 0–2)
WBC, UA: NONE SEEN /hpf (ref 0–5)

## 2020-08-12 ENCOUNTER — Telehealth: Payer: Self-pay | Admitting: Family Medicine

## 2020-08-13 LAB — URINE CULTURE

## 2020-08-14 ENCOUNTER — Encounter: Payer: Self-pay | Admitting: Family Medicine

## 2020-08-14 MED ORDER — ESCITALOPRAM OXALATE 10 MG PO TABS: 10.0000 mg | ORAL_TABLET | Freq: Every day | ORAL | 1 refills | Status: DC

## 2020-08-14 NOTE — Progress Notes (Signed)
Subjective:  Patient ID: Crystal Browning, female    DOB: 07-08-1934  Age: 87 y.o. MRN: [id number]  CC: Medical Management of Chronic Issues   HPI DURA MCCORMACK presents for recheck of her hallucinations. Family member with her today says that she is back on the Lexapro. They did not try the Seroquel. The Lexapro seemed to be adequate when she went back on it. The family member who is with her today tells me that the family member who came last time tends to want to take medications away from the patient. This person does not look at the implications of making those changes. The hallucinations now are felt by family members to have come from withdrawing the Lexapro prior to her previous visit. We have had various phone conversations in which there was concern about hallucinations and anxiety. This seems to have resolved by going back with the Lexapro. As result they did not go with the Seroquel. They say it is because of it potential side effects.   Depression screen [facility] 2/9 06/02/2020 03/30/2020 09/29/2019  Decreased Interest 0 0 0  Down, Depressed, Hopeless 0 0 0  PHQ - 2 Score 0 0 0  Altered sleeping - - -  Tired, decreased energy - - -  Change in appetite - - -  Feeling bad or failure about yourself  - - -  Trouble concentrating - - -  Moving slowly or fidgety/restless - - -  Suicidal thoughts - - -  PHQ-9 Score - - -  Difficult doing work/chores - - -    History Mignon has a past medical history of Arthritis, Atrial fibrillation ([place]) (10/30/2016), Bilateral lower extremity edema (10/30/2016), Cancer ([place]) (03/28/11), CAP (community acquired pneumonia) (10/04/2019), Depression, Diverticulosis (02/2005), Esophageal stricture (03/03/2018), Generalized abdominal pain (07/17/2015), History of chemotherapy, History of external beam radiation therapy (08/20/2016), Hypertension, Hypothyroid, Osteoporosis, 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   She has a past surgical history that includes Direct laryngoscopy (03/27/2005); Tubal ligation; Total abdominal hysterectomy w/ bilateral salpingoophorectomy (1986); Cataract extraction, bilateral; and Cardioversion (N/A, 11/23/2016).   Her family history includes Alcohol abuse in her brother; COPD in her brother; Cancer (age of onset: 69) in her father; Heart disease (age of onset: 32) in her mother; Stroke in her mother.She reports that she has never smoked. She has never used smokeless tobacco. She reports that she does not drink alcohol and does not use drugs.    ROS Review of Systems  Constitutional: Negative for fever.  HENT: Negative for congestion, rhinorrhea and sore throat.   Respiratory: Negative for cough and shortness of breath.   Cardiovascular: Negative for chest pain and palpitations.  Gastrointestinal: Negative for abdominal pain.  Genitourinary: Positive for dysuria.  Musculoskeletal: Positive for arthralgias (fell 2 days ago.  Hit her head so they went to the emergency room.  She was cleared there.  She does have some pain in the neck and shoulder currently.). Negative for myalgias.    Objective:  BP 123/76   Pulse 63   Temp 97.7 F (36.5 C) (Temporal)   Ht 5\' 1"  (1.549 m)   Wt 121 lb 12.8 oz (55.2 kg)   BMI 23.01 kg/m   BP Readings from Last 3 Encounters:  08/10/20 123/76  08/09/20 (!) 183/86  08/07/20 135/72    Wt Readings from Last 3 Encounters:  08/10/20 121 lb 12.8 oz (55.2 kg)  08/09/20 124 lb (56.2 kg)  08/07/20  123 lb 7.3 oz (56 kg)     Physical Exam Constitutional:      General: She is not in acute distress.    Appearance: She is well-developed.  HENT:     Head: Normocephalic and atraumatic.  Eyes:     Conjunctiva/sclera: Conjunctivae normal.     Pupils: Pupils are equal, round, and reactive to light.  Neck:     Thyroid: No thyromegaly.  Cardiovascular:     Rate and Rhythm: Normal rate and regular rhythm.     Heart sounds:  Normal heart sounds. No murmur heard.   Pulmonary:     Effort: Pulmonary effort is normal. No respiratory distress.     Breath sounds: Normal breath sounds. No wheezing or rales.  Abdominal:     General: Bowel sounds are normal. There is no distension.     Palpations: Abdomen is soft.     Tenderness: There is no abdominal tenderness.  Musculoskeletal:        General: Normal range of motion.     Cervical back: Normal range of motion and neck supple.  Lymphadenopathy:     Cervical: No cervical adenopathy.  Skin:    General: Skin is warm and dry.  Neurological:     Mental Status: She is alert and oriented to person, place, and time.  Psychiatric:        Mood and Affect: Mood is depressed. Affect is blunt.        Speech: Speech is delayed.        Behavior: Behavior is not slowed.       Assessment & Plan:   Tomeko was seen today for medical management of chronic issues.  Diagnoses and all orders for this visit:  UTI symptoms -     Urine Culture; Future -     Urinalysis, Complete -     Urine Culture  Anxiety -     escitalopram (LEXAPRO) 10 MG tablet; Take 1 tablet (10 mg total) by mouth daily.  Essential hypertension  Longstanding persistent atrial fibrillation (HCC)  Contusion of left shoulder, initial encounter  Long term (current) use of anticoagulants  Other orders -     Microscopic Examination       I have discontinued Vinette B. Kosta's QUEtiapine. I am also having her maintain her B Complex-Folic Acid (B COMPLEX-VITAMIN B12 PO), acetaminophen, polyethylene glycol powder, metoprolol succinate, famotidine, loratadine, apixaban, levothyroxine, and escitalopram.  Allergies as of 08/10/2020      Reactions   Amlodipine Besylate Hives   Hives and confusion.   Tuberculin Tests Swelling   Vit D-vit E-safflower Oil    Claritin [loratadine] Rash   Pt's daughter states she is allergic to claritin   Paroxetine Hcl Rash      Medication List       Accurate as  of August 10, 2020 11:59 PM. If you have any questions, ask your nurse or doctor.        STOP taking these medications   QUEtiapine 25 MG tablet Commonly known as: SEROQUEL Stopped by: [name], MD     TAKE these medications   acetaminophen 325 MG tablet Commonly known as: TYLENOL Take 2 tablets (650 mg total) by mouth every 6 (six) hours as needed for mild pain, fever or headache.   apixaban 2.5 MG Tabs tablet Commonly known as: Eliquis Take 1 tablet (2.5 mg total) by mouth 2 (two) times daily.   B COMPLEX-VITAMIN B12 PO Take by mouth.   escitalopram 10 MG  tablet Commonly known as: LEXAPRO Take 1 tablet (10 mg total) by mouth daily.   famotidine 20 MG tablet Commonly known as: PEPCID Take 1 tablet (20 mg total) by mouth every 12 (twelve) hours.   levothyroxine 50 MCG tablet Commonly known as: SYNTHROID Take 1 tablet (50 mcg total) by mouth daily.   loratadine 5 MG/5ML syrup Commonly known as: CLARITIN Take by mouth daily.   metoprolol succinate 100 MG 24 hr tablet Commonly known as: TOPROL-XL Take with or immediately following a meal.   polyethylene glycol powder 17 GM/SCOOP powder Commonly known as: GLYCOLAX/MIRALAX Take 17 g by mouth 2 (two) times daily. For constipation        Follow-up: No follow-ups on file.  [name], M.D.

## 2020-08-15 ENCOUNTER — Other Ambulatory Visit: Payer: Self-pay | Admitting: Family Medicine

## 2020-08-15 DIAGNOSIS — I4891 Unspecified atrial fibrillation: Secondary | ICD-10-CM

## 2020-08-15 MED ORDER — AMOXICILLIN 500 MG PO CAPS: 500.0000 mg | ORAL_CAPSULE | Freq: Three times a day (TID) | ORAL | 0 refills | Status: DC

## 2020-08-16 ENCOUNTER — Telehealth: Payer: Self-pay | Admitting: Orthopedic Surgery

## 2020-08-16 NOTE — Telephone Encounter (Signed)
Hoyle Sauer called and states patient has fallen out of the chair twice in one night.  She is now having more pain in her shoulder going down into her back.  She fell Sunday night 08/14/20.    Hoyle Sauer is wanting to know if she needs to be seen sooner or if she can wait and come on her next appt on 09/06/20.  Please call Hoyle Sauer at [phone].

## 2020-08-17 ENCOUNTER — Ambulatory Visit: Payer: Medicare Other

## 2020-08-17 ENCOUNTER — Encounter: Payer: Self-pay | Admitting: Orthopedic Surgery

## 2020-08-17 ENCOUNTER — Ambulatory Visit (INDEPENDENT_AMBULATORY_CARE_PROVIDER_SITE_OTHER): Payer: Medicare Other | Admitting: Orthopedic Surgery

## 2020-08-17 ENCOUNTER — Other Ambulatory Visit: Payer: Self-pay

## 2020-08-17 ENCOUNTER — Telehealth: Payer: Self-pay

## 2020-08-17 VITALS — BP 154/92 | HR 87 | Ht 61.0 in | Wt 123.0 lb

## 2020-08-17 DIAGNOSIS — S2242XA Multiple fractures of ribs, left side, initial encounter for closed fracture: Secondary | ICD-10-CM

## 2020-08-17 DIAGNOSIS — S42022D Displaced fracture of shaft of left clavicle, subsequent encounter for fracture with routine healing: Secondary | ICD-10-CM

## 2020-08-17 NOTE — Patient Instructions (Signed)
1.  Follow up with primary care doctor within 1-2 weeks 2.  Use incentive spirometer hourly 10-12 breaths each time 3.  Wear sling when walking, or out of the house 4.  If you have any issues with breathing, please contact regular doctor, or go to ED for evaluation 5.  Follow up 1 month for repeat evaluation.

## 2020-08-17 NOTE — Telephone Encounter (Signed)
Patient daughter in law Hoyle Sauer) called would like to be setup with [place] to come out to the home.  Address: [address] [place], [place] [address].

## 2020-08-17 NOTE — Progress Notes (Signed)
Orthopaedic Clinic Return  Assessment: [name] is a 87 y.o. female with the following: Displaced left clavicle fracture; interval worsening secondary to multiple falls Multiple superior rib fractures, left side  Plan: Left clavicle has displaced further.  However, I think she will recover and return to her previous level of function.  She may have a deformity at the fracture site.  In addition, it is plausible that she could develop a fibrous non-union, with little to no pain.  Given her age and overall health, I do not think surgery is an reasonable option.    More concerning at this time, is she has sustained multiple left sided rib fractures.  Based on my review of the clavicle XR, I see at least 2 separate fractures.  They are minimally displaced, non-segmental.  No evidence of a pneumothorax.  According to her granddaughter, her breathing has been relatively normal, with a few episodes of labored breathing.  On exam in clinic today, her breathing is normal.  She does have some pain with breathing, per reports.  I do not think that she needs to go to the ED based on her appearance.  However, if her breathing becomes more problematic due to the pain, she may need to be taken for further testing.  I have provided her with a prescription for an incentive spirometer to start using immediately.  Strict return precautions were discussed, including general signs of an infection and pneumonia.   I advised her to go and see her primary doctor within the next week to ensure close follow up.  Continue to use the sling.  Medications as needed.   Follow up in 4 weeks, unless she has some issues.      Follow-up: Return in about 4 weeks (around 09/14/2020).   Subjective:  Chief Complaint  Patient presents with  . Fall    Pt fell on 08/14/20    History of Present Illness: Crystal Browning is a 87 y.o. female who returns to clinic for repeat evaluation of her left shoulder.  She sustained a left  clavicle fracture after a fall on 1/23.  She has been wearing a sling.  She reportedly fell off of a chair (x2) recently and has had worsening pain.  She also has difficulty breathing.  She continues to take tylenol for the pain.     Review of Systems: No fevers or chills No numbness or tingling No bowel or bladder dysfunction No GI distress No headaches    Objective: BP (!) 154/92   Pulse 87   Ht 5\' 1"  (1.549 m)   Wt 123 lb (55.8 kg)   BMI 23.24 kg/m   Physical Exam:  Elderly female.  No acute distress  No increased work of breathing on room air.   Left clavicle with palpable deformity.  Ecchymosis over the clavicle and the chest.  Sensation intact in all nerve distributions.  Sensation intact over the axillary pouch.  Able to wiggle all fingers.  Fingers are warm and well perfused.   IMAGING: I personally ordered and reviewed the following images:  XR of the left clavicle demonstrates a midshaft fracture with 100% superior displacement of the proximal fragment.  There are 2 separate rib fractures without displacement.  No pneumothorax is appreciated.   Impression: displaced left clavicle shaft fracture with multiple rib fractures.    Mordecai Rasmussen, MD 08/17/2020 6:30 PM

## 2020-08-18 ENCOUNTER — Ambulatory Visit: Payer: Medicare Other | Admitting: Family Medicine

## 2020-08-18 NOTE — Telephone Encounter (Signed)
Patient had a visit on 08/17/20 with Dr. Amedeo Kinsman.

## 2020-08-18 NOTE — Telephone Encounter (Signed)
I called to check with daughter-in-law to verify what type of home health was needed for this patient. I left a message to have Tyhee call the office back.

## 2020-08-19 ENCOUNTER — Other Ambulatory Visit: Payer: Self-pay | Admitting: Orthopedic Surgery

## 2020-08-19 DIAGNOSIS — S42022D Displaced fracture of shaft of left clavicle, subsequent encounter for fracture with routine healing: Secondary | ICD-10-CM

## 2020-08-19 NOTE — Progress Notes (Signed)
Orders placed for a referral per the patient family request.

## 2020-08-19 NOTE — Telephone Encounter (Signed)
Patient daughter-in-law is aware that we are placing the order for home health. No other concerns.

## 2020-08-23 ENCOUNTER — Other Ambulatory Visit: Payer: Self-pay | Admitting: Orthopedic Surgery

## 2020-08-23 DIAGNOSIS — S42022D Displaced fracture of shaft of left clavicle, subsequent encounter for fracture with routine healing: Secondary | ICD-10-CM

## 2020-08-23 NOTE — Progress Notes (Signed)
New order placed for home health and physical therapy per PT. Ginnnie [phone].

## 2020-08-24 ENCOUNTER — Ambulatory Visit (INDEPENDENT_AMBULATORY_CARE_PROVIDER_SITE_OTHER): Payer: Medicare Other | Admitting: Family Medicine

## 2020-08-24 ENCOUNTER — Encounter: Payer: Self-pay | Admitting: Family Medicine

## 2020-08-24 ENCOUNTER — Other Ambulatory Visit: Payer: Self-pay

## 2020-08-24 VITALS — BP 135/72 | HR 73 | Temp 97.7°F | Ht 61.0 in | Wt 126.4 lb

## 2020-08-24 DIAGNOSIS — G3184 Mild cognitive impairment, so stated: Secondary | ICD-10-CM | POA: Diagnosis not present

## 2020-08-24 DIAGNOSIS — Z7901 Long term (current) use of anticoagulants: Secondary | ICD-10-CM

## 2020-08-24 DIAGNOSIS — S2249XA Multiple fractures of ribs, unspecified side, initial encounter for closed fracture: Secondary | ICD-10-CM | POA: Diagnosis not present

## 2020-08-24 DIAGNOSIS — I10 Essential (primary) hypertension: Secondary | ICD-10-CM | POA: Diagnosis not present

## 2020-08-24 NOTE — Progress Notes (Signed)
Subjective:  Patient ID: Crystal Browning, female    DOB: [birth year]  Age: 87 y.o. MRN: [id number]  CC: 1 week recheck (Per Ortho/)   HPI Crystal Browning presents for having a fall shortly after she was last here.  She followed up with orthopedics and they are monitoring her closely.  However they asked that she follow-up here due to multiple rib fractures and concern for her breathing.  They gave her an incentive spirometer which she is using regularly.  She is having moderate mid back pain.  She is not short of breath.  She has had no cough and no hemoptysis.  She is followed by Ortho for a left clavicle fracture.  She is keeping it in a sling and swath per their directions.  There is some pain at the left clavicle.  She has had no further seizures according to her caregiver who is with her today, Crystal Browning.  She is the daughter-in-law.  She states they are giving her the medications as prescribed and this has resolved the previous hallucination episodes.  Depression screen [facility] 2/9 08/24/2020 06/02/2020 03/30/2020  Decreased Interest 0 0 0  Down, Depressed, Hopeless - 0 0  PHQ - 2 Score 0 0 0  Altered sleeping - - -  Tired, decreased energy - - -  Change in appetite - - -  Feeling bad or failure about yourself  - - -  Trouble concentrating - - -  Moving slowly or fidgety/restless - - -  Suicidal thoughts - - -  PHQ-9 Score - - -  Difficult doing work/chores - - -    History Crystal Browning has a past medical history of Arthritis, Atrial fibrillation ([place]) (10/30/2016), Bilateral lower extremity edema (10/30/2016), Cancer ([place]) (03/28/11), CAP (community acquired pneumonia) (10/04/2019), Depression, Diverticulosis (02/2005), Esophageal stricture (03/03/2018), Generalized abdominal pain (07/17/2015), History of chemotherapy, History of external beam radiation therapy (08/20/2016), Hypertension, Hypothyroid, Osteoporosis, 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   She has a past surgical history that includes Direct laryngoscopy (03/27/2005); Tubal ligation; Total abdominal hysterectomy w/ bilateral salpingoophorectomy (1986); Cataract extraction, bilateral; and Cardioversion (N/A, 11/23/2016).   Her family history includes Alcohol abuse in her brother; COPD in her brother; Cancer (age of onset: 32) in her father; Heart disease (age of onset: 67) in her mother; Stroke in her mother.She reports that she has never smoked. She has never used smokeless tobacco. She reports that she does not drink alcohol and does not use drugs.    ROS Review of Systems  Constitutional: Negative.   HENT: Negative.   Eyes: Negative for visual disturbance.  Respiratory: Negative for shortness of breath.   Cardiovascular: Negative for chest pain.  Gastrointestinal: Negative for abdominal pain.  Musculoskeletal: Negative for arthralgias.    Objective:  BP 135/72   Pulse 73   Temp 97.7 F (36.5 C) (Temporal)   Ht 5\' 1"  (1.549 m)   Wt 126 lb 6.4 oz (57.3 kg)   BMI 23.88 kg/m   BP Readings from Last 3 Encounters:  08/24/20 135/72  08/17/20 (!) 154/92  08/10/20 123/76    Wt Readings from Last 3 Encounters:  08/24/20 126 lb 6.4 oz (57.3 kg)  08/17/20 123 lb (55.8 kg)  08/10/20 121 lb 12.8 oz (55.2 kg)     Physical Exam Constitutional:      General: She is not in acute distress.    Appearance: She is well-developed and well-nourished.  Cardiovascular:  Rate and Rhythm: Normal rate and regular rhythm.  Pulmonary:     Breath sounds: Normal breath sounds.  Musculoskeletal:        General: Tenderness (mild at the site of the rib fractures) present.  Skin:    General: Skin is warm and dry.  Neurological:     Mental Status: She is alert and oriented to person, place, and time.  Psychiatric:        Mood and Affect: Mood and affect normal.       Assessment & Plan:   Crystal Browning was seen today for 1 week recheck.  Diagnoses and all orders for  this visit:  Closed fracture of multiple ribs, unspecified laterality, initial encounter  Long term (current) use of anticoagulants  Essential hypertension  Mild cognitive impairment   She was counseled to keep the arm in the sling and swath except when bathing.  She should hold it against her chest when she is not wearing the sling and swath.  She should continue her current medications as is.  Her caregiver Crystal Browning will monitor her for cognitive impairment changes specifically of course if she develops hallucinations again.  We discussed use of incentive spirometry for the rib fractures to help prevent lung puncture.  However she should report shortness of breath hemoptysis and excessive cough right away.    I am having Crystal Browning maintain her B Complex-Folic Acid (B COMPLEX-VITAMIN B12 PO), acetaminophen, polyethylene glycol powder, metoprolol succinate, famotidine, loratadine, apixaban, levothyroxine, escitalopram, Eliquis, and amoxicillin.  Allergies as of 08/24/2020      Reactions   Amlodipine Besylate Hives   Hives and confusion.   Tuberculin Tests Swelling   Vit D-vit E-safflower Oil    Claritin [loratadine] Rash   Pt's daughter states she is allergic to claritin   Paroxetine Hcl Rash      Medication List       Accurate as of August 24, 2020  9:17 PM. If you have any questions, ask your nurse or doctor.        acetaminophen 325 MG tablet Commonly known as: TYLENOL Take 2 tablets (650 mg total) by mouth every 6 (six) hours as needed for mild pain, fever or headache.   amoxicillin 500 MG capsule Commonly known as: AMOXIL Take 1 capsule (500 mg total) by mouth 3 (three) times daily.   apixaban 2.5 MG Tabs tablet Commonly known as: Eliquis Take 1 tablet (2.5 mg total) by mouth 2 (two) times daily.   Eliquis 5 MG Tabs tablet Generic drug: apixaban TAKE 1 TABLET BY MOUTH TWICE A DAY   B COMPLEX-VITAMIN B12 PO Take by mouth.   escitalopram 10 MG  tablet Commonly known as: LEXAPRO Take 1 tablet (10 mg total) by mouth daily.   famotidine 20 MG tablet Commonly known as: PEPCID Take 1 tablet (20 mg total) by mouth every 12 (twelve) hours.   levothyroxine 50 MCG tablet Commonly known as: SYNTHROID Take 1 tablet (50 mcg total) by mouth daily.   loratadine 5 MG/5ML syrup Commonly known as: CLARITIN Take by mouth daily.   metoprolol succinate 100 MG 24 hr tablet Commonly known as: TOPROL-XL Take with or immediately following a meal.   polyethylene glycol powder 17 GM/SCOOP powder Commonly known as: GLYCOLAX/MIRALAX Take 17 g by mouth 2 (two) times daily. For constipation        Follow-up: Return in about 2 weeks (around 09/07/2020).  [name], M.D.

## 2020-08-25 DIAGNOSIS — F32A Depression, unspecified: Secondary | ICD-10-CM | POA: Diagnosis not present

## 2020-08-25 DIAGNOSIS — I4891 Unspecified atrial fibrillation: Secondary | ICD-10-CM | POA: Diagnosis not present

## 2020-08-25 DIAGNOSIS — S42002D Fracture of unspecified part of left clavicle, subsequent encounter for fracture with routine healing: Secondary | ICD-10-CM | POA: Diagnosis not present

## 2020-08-25 DIAGNOSIS — Z8673 Personal history of transient ischemic attack (TIA), and cerebral infarction without residual deficits: Secondary | ICD-10-CM | POA: Diagnosis not present

## 2020-08-25 DIAGNOSIS — I10 Essential (primary) hypertension: Secondary | ICD-10-CM | POA: Diagnosis not present

## 2020-08-25 DIAGNOSIS — M810 Age-related osteoporosis without current pathological fracture: Secondary | ICD-10-CM | POA: Diagnosis not present

## 2020-08-25 DIAGNOSIS — W19XXXD Unspecified fall, subsequent encounter: Secondary | ICD-10-CM | POA: Diagnosis not present

## 2020-08-25 DIAGNOSIS — S2232XD Fracture of one rib, left side, subsequent encounter for fracture with routine healing: Secondary | ICD-10-CM | POA: Diagnosis not present

## 2020-08-25 DIAGNOSIS — Z9181 History of falling: Secondary | ICD-10-CM | POA: Diagnosis not present

## 2020-08-25 DIAGNOSIS — Z7901 Long term (current) use of anticoagulants: Secondary | ICD-10-CM | POA: Diagnosis not present

## 2020-08-25 DIAGNOSIS — M1990 Unspecified osteoarthritis, unspecified site: Secondary | ICD-10-CM | POA: Diagnosis not present

## 2020-08-25 DIAGNOSIS — N390 Urinary tract infection, site not specified: Secondary | ICD-10-CM | POA: Diagnosis not present

## 2020-08-25 DIAGNOSIS — E039 Hypothyroidism, unspecified: Secondary | ICD-10-CM | POA: Diagnosis not present

## 2020-08-26 DIAGNOSIS — S42002D Fracture of unspecified part of left clavicle, subsequent encounter for fracture with routine healing: Secondary | ICD-10-CM | POA: Diagnosis not present

## 2020-08-26 DIAGNOSIS — N390 Urinary tract infection, site not specified: Secondary | ICD-10-CM | POA: Diagnosis not present

## 2020-08-26 DIAGNOSIS — S2232XD Fracture of one rib, left side, subsequent encounter for fracture with routine healing: Secondary | ICD-10-CM | POA: Diagnosis not present

## 2020-08-26 DIAGNOSIS — W19XXXD Unspecified fall, subsequent encounter: Secondary | ICD-10-CM | POA: Diagnosis not present

## 2020-08-26 DIAGNOSIS — I4891 Unspecified atrial fibrillation: Secondary | ICD-10-CM | POA: Diagnosis not present

## 2020-08-26 DIAGNOSIS — I10 Essential (primary) hypertension: Secondary | ICD-10-CM | POA: Diagnosis not present

## 2020-08-28 ENCOUNTER — Encounter (HOSPITAL_COMMUNITY): Payer: Self-pay

## 2020-08-28 ENCOUNTER — Other Ambulatory Visit: Payer: Self-pay

## 2020-08-28 ENCOUNTER — Emergency Department (HOSPITAL_COMMUNITY)
Admission: EM | Admit: 2020-08-28 | Discharge: 2020-08-28 | Disposition: A | Discharge status: Home / Self Care | Payer: Medicare Other | Attending: Emergency Medicine | Admitting: Emergency Medicine

## 2020-08-28 ENCOUNTER — Emergency Department (HOSPITAL_COMMUNITY): Payer: Medicare Other

## 2020-08-28 DIAGNOSIS — R059 Cough, unspecified: Secondary | ICD-10-CM | POA: Diagnosis not present

## 2020-08-28 DIAGNOSIS — Z7901 Long term (current) use of anticoagulants: Secondary | ICD-10-CM | POA: Insufficient documentation

## 2020-08-28 DIAGNOSIS — U071 COVID-19: Secondary | ICD-10-CM | POA: Diagnosis not present

## 2020-08-28 DIAGNOSIS — I10 Essential (primary) hypertension: Secondary | ICD-10-CM | POA: Insufficient documentation

## 2020-08-28 DIAGNOSIS — R0602 Shortness of breath: Secondary | ICD-10-CM | POA: Insufficient documentation

## 2020-08-28 DIAGNOSIS — E039 Hypothyroidism, unspecified: Secondary | ICD-10-CM | POA: Insufficient documentation

## 2020-08-28 DIAGNOSIS — Z8521 Personal history of malignant neoplasm of larynx: Secondary | ICD-10-CM | POA: Insufficient documentation

## 2020-08-28 DIAGNOSIS — Z79899 Other long term (current) drug therapy: Secondary | ICD-10-CM | POA: Diagnosis not present

## 2020-08-28 DIAGNOSIS — Z209 Contact with and (suspected) exposure to unspecified communicable disease: Secondary | ICD-10-CM | POA: Diagnosis not present

## 2020-08-28 DIAGNOSIS — I4891 Unspecified atrial fibrillation: Secondary | ICD-10-CM | POA: Diagnosis not present

## 2020-08-28 DIAGNOSIS — R109 Unspecified abdominal pain: Secondary | ICD-10-CM | POA: Diagnosis not present

## 2020-08-28 DIAGNOSIS — Z9221 Personal history of antineoplastic chemotherapy: Secondary | ICD-10-CM | POA: Diagnosis not present

## 2020-08-28 DIAGNOSIS — R509 Fever, unspecified: Secondary | ICD-10-CM | POA: Diagnosis not present

## 2020-08-28 LAB — COMPREHENSIVE METABOLIC PANEL
ALT: 26 U/L (ref 0–44)
AST: 56 U/L — ABNORMAL HIGH (ref 15–41)
Albumin: 3.4 g/dL — ABNORMAL LOW (ref 3.5–5.0)
Alkaline Phosphatase: 98 U/L (ref 38–126)
Anion gap: 7 (ref 5–15)
BUN: 10 mg/dL (ref 8–23)
CO2: 22 mmol/L (ref 22–32)
Calcium: 8.9 mg/dL (ref 8.9–10.3)
Chloride: 106 mmol/L (ref 98–111)
Creatinine, Ser: 0.74 mg/dL (ref 0.44–1.00)
GFR, Estimated: >60 mL/min (ref >60)
Glucose, Bld: 103 mg/dL — ABNORMAL HIGH (ref 70–99)
Potassium: 4.1 mmol/L (ref 3.5–5.1)
Sodium: 135 mmol/L (ref 135–145)
Total Bilirubin: 0.4 mg/dL (ref 0.3–1.2)
Total Protein: 7.1 g/dL (ref 6.5–8.1)

## 2020-08-28 LAB — CBC WITH DIFFERENTIAL/PLATELET
Abs Immature Granulocytes: 0.02 10*3/uL (ref 0.00–0.07)
Basophils Absolute: 0 10*3/uL (ref 0.0–0.1)
Basophils Relative: 0 %
Eosinophils Absolute: 0.1 10*3/uL (ref 0.0–0.5)
Eosinophils Relative: 2 %
HCT: 37 % (ref 36.0–46.0)
Hemoglobin: 12.2 g/dL (ref 12.0–15.0)
Immature Granulocytes: 0 %
Lymphocytes Relative: 13 %
Lymphs Abs: 0.7 10*3/uL (ref 0.7–4.0)
MCH: 32.3 pg (ref 26.0–34.0)
MCHC: 33 g/dL (ref 30.0–36.0)
MCV: 97.9 fL (ref 80.0–100.0)
Monocytes Absolute: 0.5 10*3/uL (ref 0.1–1.0)
Monocytes Relative: 10 %
Neutro Abs: 3.9 10*3/uL (ref 1.7–7.7)
Neutrophils Relative %: 75 %
Platelets: 231 10*3/uL (ref 150–400)
RBC: 3.78 MIL/uL — ABNORMAL LOW (ref 3.87–5.11)
RDW: 14 % (ref 11.5–15.5)
WBC: 5.2 10*3/uL (ref 4.0–10.5)
nRBC: 0 % (ref 0.0–0.2)

## 2020-08-28 LAB — POC SARS CORONAVIRUS 2 AG -  ED: SARS Coronavirus 2 Ag: POSITIVE — AB

## 2020-08-28 IMAGING — DX DG CHEST 1V PORT
1 series · 1 of 1 positions shown · non-contrast
Comparison: [DATE]

CLINICAL DATA: Cough, fever and flank pain

EXAM:
PORTABLE CHEST 1 VIEW

[chest ap]
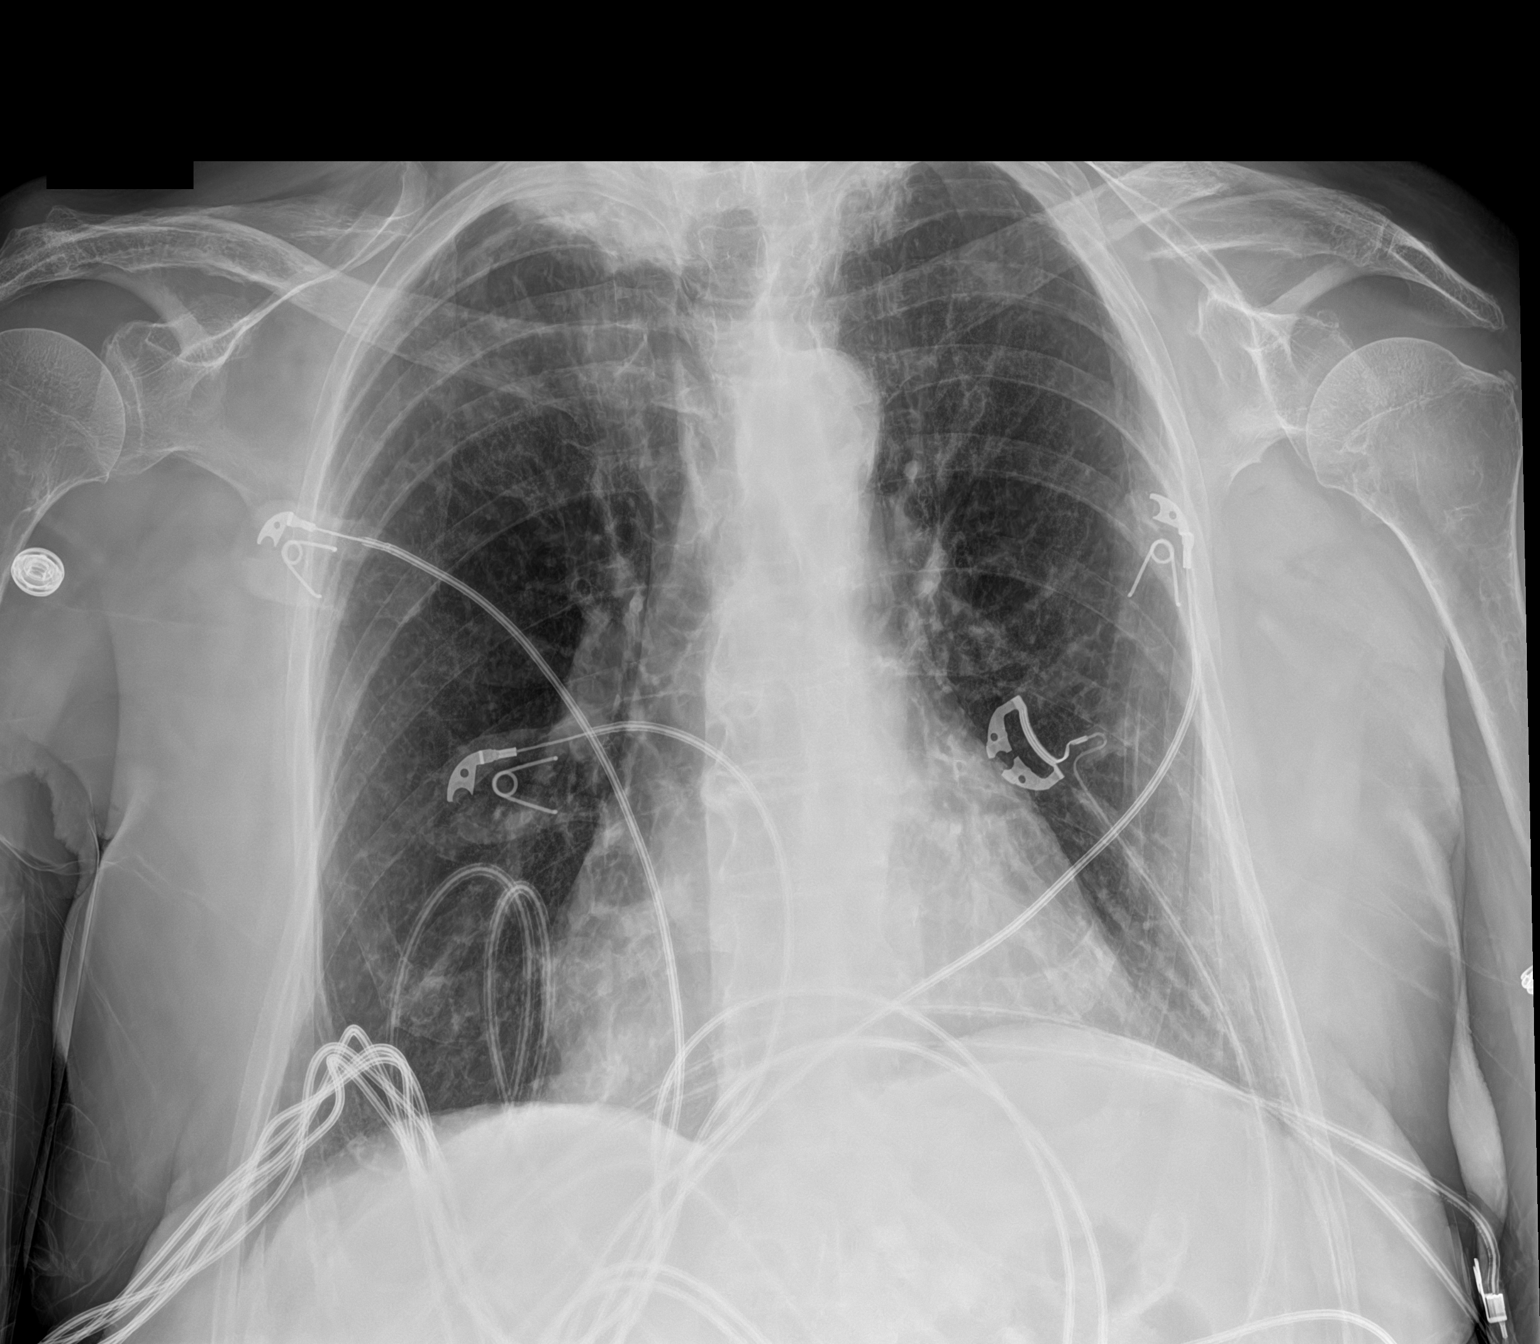

[1 of 1 positions shown; findings below may reference images not displayed]

FINDINGS: Heart size upper limits of normal. Mediastinal shadows are normal.
Chronic pleural and parenchymal scarring at both lung apices. Mild
chronic scarring at the right lung base. No sign of active
infiltrate, collapse or effusion. No acute bone finding.
IMPRESSION: No active disease. Chronic pleural and parenchymal scarring at both
lung apices and right base.

## 2020-08-28 MED ORDER — SODIUM CHLORIDE 0.9 % IV BOLUS
1000.0000 mL | Freq: Once | INTRAVENOUS | Status: AC
  Administered 2020-08-28: 1000 mL via INTRAVENOUS

## 2020-08-28 MED ORDER — BENZONATATE 100 MG PO CAPS: 100.0000 mg | ORAL_CAPSULE | Freq: Three times a day (TID) | ORAL | 0 refills | Status: AC

## 2020-08-28 NOTE — ED Triage Notes (Signed)
Pt brought in by [facility] from home with cc of possible COVID. Pt did not want to come but family highly encouraged it. Per family pt has a temp of 101 but they gave tylenol. Everyone at home has covid.  She also complains of slight pain in her should and ribs from where she fell last month - has already been seen for this.

## 2020-08-28 NOTE — ED Notes (Signed)
X Ray at bedside at this time.  

## 2020-08-28 NOTE — ED Notes (Signed)
ED Provider at bedside. 

## 2020-08-28 NOTE — ED Provider Notes (Signed)
[facility] EMERGENCY DEPARTMENT Provider Note   CSN: [id number] Arrival date & time: 08/28/20  2129     History Chief Complaint  Patient presents with  . Covid Exposure    Crystal Browning is a 87 y.o. female.  HPI   87 y/o F with a h/o afib, CA, CAP, depression, diverticulosis, esophageal stricture, abd pain, HTN, hypothyroid, osteoporosis, TIA, who presents to the ED today for eval of possible COVID.  Patient states that her whole family has Covid at home.  She herself has not been feeling well for the last day and states she has had a cough.  She has some shortness of breath that she currently has a clavicle fracture and some rib fractures.  She denies any chest pain.  She states her family told her she has a fever.  She denies any runny nose, congestion, sore throat.  Denies any vomiting or abdominal pain but does states she is had some diarrhea.  She denies any urinary symptoms.  She has not been vaccinated against Covid.  10:12 PM Discussed case with Will Bonnet, family member of the patient. She states that pt has been exposed to covid. She states pt has had a cough, rhinorrhea, fever and decreased appetite.   Past Medical History:  Diagnosis Date  . Arthritis   . Atrial fibrillation ([place]) 10/30/2016  . Bilateral lower extremity edema 10/30/2016  . Cancer ([place]) 03/28/11   SCCA of the Supraglottic Larynx (T2NoMo)    ; S/P Chemoradiation therapy from 05/09/05 thru 06/29/05  . CAP (community acquired pneumonia) 10/04/2019  . Depression    History of Depression- Lexapro therapy  . Diverticulosis 02/2005   History of diverticulosis with admission from 8/18 - 03/04/2005  . Esophageal stricture 03/03/2018  . Generalized abdominal pain 07/17/2015  . History of chemotherapy    S/P chemotherapy with Dr. Sonny Dandy -2006  . History of external beam radiation therapy 08/20/2016  . Hypertension   . Hypothyroid   . Osteoporosis    History of Osteoporosis with one year of Actonel only  . 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 Radiation    S/P radiation thrapy -06/2005  . TIA (transient ischemic attack)    History of TIA's with no residual effect    Patient Active Problem List   Diagnosis Date Noted  . Pharyngoesophageal dysphagia 01/14/2018  . Long term (current) use of anticoagulants 12/14/2016  . Atrial fibrillation ([place]) 10/30/2016  . Other fatigue 10/30/2016  . Venous insufficiency of both lower extremities 10/30/2016  . History of laryngeal cancer 08/20/2016  . Xerostomia 08/20/2016  . Postmenopausal 07/17/2015  . Thyroid activity decreased 07/17/2015  . Essential hypertension 07/12/2015  . Angina pectoris ([place]) 07/12/2015  . Vitamin D deficiency 07/12/2015    Past Surgical History:  Procedure Laterality Date  . CARDIOVERSION N/A 11/23/2016   Procedure: CARDIOVERSION;  Surgeon: Pixie Casino, MD;  Location: [facility] ENDOSCOPY;  Service: Cardiovascular;  Laterality: N/A;  . CATARACT EXTRACTION, BILATERAL    . DIRECT LARYNGOSCOPY  03/27/2005   S/P direct laryngoscopy, esophagoscopy and biopsy with Dr. Constance Holster  . TOTAL ABDOMINAL HYSTERECTOMY W/ BILATERAL SALPINGOOPHORECTOMY  1986  . TUBAL LIGATION       OB History   No obstetric history on file.     Family History  Problem Relation Age of Onset  . Stroke Mother   . Heart disease Mother 50  . Cancer Father 40       Prostate  . Alcohol  abuse Brother   . COPD Brother     Social History   Tobacco Use  . Smoking status: Never Smoker  . Smokeless tobacco: Never Used  Vaping Use  . Vaping Use: Never used  Substance Use Topics  . Alcohol use: No  . Drug use: No    Home Medications Prior to Admission medications   Medication Sig Start Date End Date Taking? Authorizing Provider  benzonatate (TESSALON) 100 MG capsule Take 1 capsule (100 mg total) by mouth every 8 (eight) hours for 5 days. 08/28/20 09/02/20 Yes Aviona Martenson S, PA-C  acetaminophen (TYLENOL) 325 MG tablet Take 2 tablets (650 mg  total) by mouth every 6 (six) hours as needed for mild pain, fever or headache. 10/07/19   Roxan Hockey, MD  amoxicillin (AMOXIL) 500 MG capsule Take 1 capsule (500 mg total) by mouth 3 (three) times daily. 08/15/20   [name], MD  apixaban (ELIQUIS) 2.5 MG TABS tablet Take 1 tablet (2.5 mg total) by mouth 2 (two) times daily. 07/11/20   [name], MD  B Complex-Folic Acid (B COMPLEX-VITAMIN B12 PO) Take by mouth.    [provider]  ELIQUIS 5 MG TABS tablet TAKE 1 TABLET BY MOUTH TWICE A DAY 08/15/20   [name], MD  escitalopram (LEXAPRO) 10 MG tablet Take 1 tablet (10 mg total) by mouth daily. 08/14/20   [name], MD  famotidine (PEPCID) 20 MG tablet Take 1 tablet (20 mg total) by mouth every 12 (twelve) hours. 03/30/20   [name], MD  levothyroxine (SYNTHROID) 50 MCG tablet Take 1 tablet (50 mcg total) by mouth daily. 07/11/20   [name], MD  loratadine (CLARITIN) 5 MG/5ML syrup Take by mouth daily.    [provider]  metoprolol succinate (TOPROL-XL) 100 MG 24 hr tablet Take with or immediately following a meal. 03/30/20   [name], MD  polyethylene glycol powder (GLYCOLAX/MIRALAX) 17 GM/SCOOP powder Take 17 g by mouth 2 (two) times daily. For constipation 10/07/19   Roxan Hockey, MD    Allergies    Amlodipine besylate, Tuberculin tests, Vit d-vit e-safflower oil, Claritin [loratadine], and Paroxetine hcl  Review of Systems   Review of Systems  Constitutional: Positive for fever. Negative for chills.       Malaise  HENT: Negative for ear pain and sore throat.   Eyes: Negative for visual disturbance.  Respiratory: Positive for cough and shortness of breath.   Cardiovascular: Negative for chest pain.  Gastrointestinal: Positive for diarrhea. Negative for abdominal pain, constipation, nausea and vomiting.  Genitourinary: Negative for dysuria and hematuria.  Musculoskeletal: Negative for back pain.  Skin: Negative for rash.   Neurological: Negative for seizures and syncope.  All other systems reviewed and are negative.   Physical Exam Updated Vital Signs BP (!) 154/60 (BP Location: Right Arm)   Pulse 70   Temp 98.5 F (36.9 C) (Oral)   Resp 16   Ht 5\' 1"  (1.549 m)   Wt 57.3 kg   SpO2 97%   BMI 23.88 kg/m   Physical Exam Vitals and nursing note reviewed.  Constitutional:      General: She is not in acute distress.    Appearance: She is well-developed and well-nourished.  HENT:     Head: Normocephalic and atraumatic.     Mouth/Throat:     Mouth: Mucous membranes are dry.  Eyes:     Conjunctiva/sclera: Conjunctivae normal.  Cardiovascular:     Rate and Rhythm: Normal rate and regular  rhythm.     Heart sounds: Normal heart sounds. No murmur heard.   Pulmonary:     Effort: Pulmonary effort is normal. No respiratory distress.     Breath sounds: Rales (faint rales to the rll) present. No wheezing or rhonchi.  Abdominal:     General: Bowel sounds are normal.     Palpations: Abdomen is soft.     Tenderness: There is no abdominal tenderness. There is no guarding or rebound.  Musculoskeletal:        General: No edema.     Cervical back: Neck supple.  Skin:    General: Skin is warm and dry.  Neurological:     Mental Status: She is alert.  Psychiatric:        Mood and Affect: Mood and affect normal.     ED Results / Procedures / Treatments   Labs (all labs ordered are listed, but only abnormal results are displayed) Labs Reviewed  CBC WITH DIFFERENTIAL/PLATELET - Abnormal; Notable for the following components:      Result Value   RBC 3.78 (*)    All other components within normal limits  COMPREHENSIVE METABOLIC PANEL - Abnormal; Notable for the following components:   Glucose, Bld 103 (*)    Albumin 3.4 (*)    AST 56 (*)    All other components within normal limits  POC SARS CORONAVIRUS 2 AG -  ED - Abnormal; Notable for the following components:   SARS Coronavirus 2 Ag Positive (*)     All other components within normal limits    EKG None  Radiology DG Chest Portable 1 View  Result Date: 08/28/2020 CLINICAL DATA:  Cough, fever and flank pain EXAM: PORTABLE CHEST 1 VIEW COMPARISON:  08/07/2020 FINDINGS: Heart size upper limits of normal. Mediastinal shadows are normal. Chronic pleural and parenchymal scarring at both lung apices. Mild chronic scarring at the right lung base. No sign of active infiltrate, collapse or effusion. No acute bone finding. IMPRESSION: No active disease. Chronic pleural and parenchymal scarring at both lung apices and right base. Electronically Signed   By: Nelson Chimes M.D.   On: 08/28/2020 22:14    Procedures Procedures   Medications Ordered in ED Medications  sodium chloride 0.9 % bolus 1,000 mL (1,000 mLs Intravenous New Bag/Given 08/28/20 2207)    ED Course  I have reviewed the triage vital signs and the nursing notes.  Pertinent labs & imaging results that were available during my care of the patient were reviewed by me and considered in my medical decision making (see chart for details).    MDM Rules/Calculators/A&P                          87 y/o F who presents to the ED today for eval of a fever and uri sxs. Had covid exposure and family is concern for covid.  Reviewed/interpreted labs CBC is unremarkable CMP with no significant electrolyte derangement, normal kidney function, mildly elevated AST consistenti with covid infection COVID positive  CXR - No active disease. Chronic pleural and parenchymal scarring at both lung apices and right base  Pt w/u is reassuring here in the Ed. She is not hypoxic and is not significantly tachypneic here in the ED. She does not appear to require admission at this time. I discussed that case with her family, carolyn Armas, and advised to keep you hydrated and treat her fevers. Advised close f/u with her pcp and  strict return precautions. She voices understanding of the plan and reasons to  return. All questions answered, pt stable for discharge.   Final Clinical Impression(s) / ED Diagnoses Final diagnoses:  COVID    Rx / DC Orders ED Discharge Orders         Ordered    benzonatate (TESSALON) 100 MG capsule  Every 8 hours        08/28/20 2244           Rodney Booze, PA-C 08/28/20 2245    Davonna Belling, MD 08/30/20 [phone]

## 2020-08-28 NOTE — Discharge Instructions (Signed)
Give tessalon as directed for cough.   You can administer 650mg  of tylenol every 6 hours for fever.   Please follow up with your primary care provider within 3-4 days for re-evaluation of your symptoms. If you do not have a primary care provider, information for a healthcare clinic has been provided for you to make arrangements for follow up care. Please return to the emergency department for any new or worsening symptoms.

## 2020-08-28 NOTE — ED Notes (Signed)

## 2020-08-28 NOTE — ED Notes (Signed)
This RN spoke with patient's son, Ronalee Belts at [phone], and educated on current plan of care and pending discharge. Per son, will be to hospital in 25-30 minutes for transportation. This RN in agreement at this time.

## 2020-08-29 ENCOUNTER — Telehealth: Payer: Self-pay

## 2020-08-29 DIAGNOSIS — N390 Urinary tract infection, site not specified: Secondary | ICD-10-CM | POA: Diagnosis not present

## 2020-08-29 DIAGNOSIS — W19XXXD Unspecified fall, subsequent encounter: Secondary | ICD-10-CM | POA: Diagnosis not present

## 2020-08-29 DIAGNOSIS — I10 Essential (primary) hypertension: Secondary | ICD-10-CM | POA: Diagnosis not present

## 2020-08-29 DIAGNOSIS — S2232XD Fracture of one rib, left side, subsequent encounter for fracture with routine healing: Secondary | ICD-10-CM | POA: Diagnosis not present

## 2020-08-29 DIAGNOSIS — U071 COVID-19: Secondary | ICD-10-CM

## 2020-08-29 DIAGNOSIS — I4891 Unspecified atrial fibrillation: Secondary | ICD-10-CM | POA: Diagnosis not present

## 2020-08-29 DIAGNOSIS — S42002D Fracture of unspecified part of left clavicle, subsequent encounter for fracture with routine healing: Secondary | ICD-10-CM | POA: Diagnosis not present

## 2020-08-29 NOTE — Telephone Encounter (Signed)
Positive covid

## 2020-08-29 NOTE — Telephone Encounter (Signed)
Called to discuss with patient about COVID-19 symptoms and the use of one of the available treatments for those with mild to moderate Covid symptoms and at a high risk of hospitalization.  Pt appears to qualify for outpatient treatment due to co-morbid conditions and/or a member of an at-risk group in accordance with the FDA Emergency Use Authorization.    Symptom onset: 08/27/20 per ED note Vaccinated: No Booster? No Immunocompromised? No Qualifiers: HTN, A-fib, History of cancer  Unable to reach pt - Left message and call back number [phone].  Crystal Browning

## 2020-08-30 DIAGNOSIS — S2232XD Fracture of one rib, left side, subsequent encounter for fracture with routine healing: Secondary | ICD-10-CM | POA: Diagnosis not present

## 2020-08-30 DIAGNOSIS — S42002D Fracture of unspecified part of left clavicle, subsequent encounter for fracture with routine healing: Secondary | ICD-10-CM | POA: Diagnosis not present

## 2020-08-30 DIAGNOSIS — I4891 Unspecified atrial fibrillation: Secondary | ICD-10-CM | POA: Diagnosis not present

## 2020-08-30 DIAGNOSIS — W19XXXD Unspecified fall, subsequent encounter: Secondary | ICD-10-CM | POA: Diagnosis not present

## 2020-08-30 DIAGNOSIS — N390 Urinary tract infection, site not specified: Secondary | ICD-10-CM | POA: Diagnosis not present

## 2020-08-30 DIAGNOSIS — I10 Essential (primary) hypertension: Secondary | ICD-10-CM | POA: Diagnosis not present

## 2020-09-01 DIAGNOSIS — S42002D Fracture of unspecified part of left clavicle, subsequent encounter for fracture with routine healing: Secondary | ICD-10-CM | POA: Diagnosis not present

## 2020-09-01 DIAGNOSIS — S2232XD Fracture of one rib, left side, subsequent encounter for fracture with routine healing: Secondary | ICD-10-CM | POA: Diagnosis not present

## 2020-09-01 DIAGNOSIS — I4891 Unspecified atrial fibrillation: Secondary | ICD-10-CM | POA: Diagnosis not present

## 2020-09-01 DIAGNOSIS — I10 Essential (primary) hypertension: Secondary | ICD-10-CM | POA: Diagnosis not present

## 2020-09-01 DIAGNOSIS — N390 Urinary tract infection, site not specified: Secondary | ICD-10-CM | POA: Diagnosis not present

## 2020-09-01 DIAGNOSIS — W19XXXD Unspecified fall, subsequent encounter: Secondary | ICD-10-CM | POA: Diagnosis not present

## 2020-09-03 ENCOUNTER — Encounter (HOSPITAL_COMMUNITY): Payer: Self-pay | Admitting: Emergency Medicine

## 2020-09-03 ENCOUNTER — Emergency Department (HOSPITAL_COMMUNITY)
Admission: EM | Admit: 2020-09-03 | Discharge: 2020-09-03 | Disposition: A | Discharge status: Home / Self Care | Payer: Medicare Other | Attending: Emergency Medicine | Admitting: Emergency Medicine

## 2020-09-03 ENCOUNTER — Other Ambulatory Visit: Payer: Self-pay

## 2020-09-03 DIAGNOSIS — Z8521 Personal history of malignant neoplasm of larynx: Secondary | ICD-10-CM | POA: Insufficient documentation

## 2020-09-03 DIAGNOSIS — Z8673 Personal history of transient ischemic attack (TIA), and cerebral infarction without residual deficits: Secondary | ICD-10-CM | POA: Insufficient documentation

## 2020-09-03 DIAGNOSIS — R109 Unspecified abdominal pain: Secondary | ICD-10-CM | POA: Diagnosis not present

## 2020-09-03 DIAGNOSIS — E039 Hypothyroidism, unspecified: Secondary | ICD-10-CM | POA: Diagnosis not present

## 2020-09-03 DIAGNOSIS — I4891 Unspecified atrial fibrillation: Secondary | ICD-10-CM | POA: Diagnosis not present

## 2020-09-03 DIAGNOSIS — I10 Essential (primary) hypertension: Secondary | ICD-10-CM | POA: Diagnosis not present

## 2020-09-03 DIAGNOSIS — Z7901 Long term (current) use of anticoagulants: Secondary | ICD-10-CM | POA: Insufficient documentation

## 2020-09-03 DIAGNOSIS — Z79899 Other long term (current) drug therapy: Secondary | ICD-10-CM | POA: Diagnosis not present

## 2020-09-03 DIAGNOSIS — I4510 Unspecified right bundle-branch block: Secondary | ICD-10-CM | POA: Diagnosis not present

## 2020-09-03 DIAGNOSIS — R1084 Generalized abdominal pain: Secondary | ICD-10-CM | POA: Diagnosis not present

## 2020-09-03 DIAGNOSIS — R0689 Other abnormalities of breathing: Secondary | ICD-10-CM | POA: Diagnosis not present

## 2020-09-03 DIAGNOSIS — Z0000 Encounter for general adult medical examination without abnormal findings: Secondary | ICD-10-CM

## 2020-09-03 DIAGNOSIS — R1012 Left upper quadrant pain: Secondary | ICD-10-CM | POA: Diagnosis not present

## 2020-09-03 LAB — COMPREHENSIVE METABOLIC PANEL
ALT: 25 U/L (ref 0–44)
AST: 51 U/L — ABNORMAL HIGH (ref 15–41)
Albumin: 3.9 g/dL (ref 3.5–5.0)
Alkaline Phosphatase: 92 U/L (ref 38–126)
Anion gap: 6 (ref 5–15)
BUN: 9 mg/dL (ref 8–23)
CO2: 25 mmol/L (ref 22–32)
Calcium: 9.1 mg/dL (ref 8.9–10.3)
Chloride: 105 mmol/L (ref 98–111)
Creatinine, Ser: 0.72 mg/dL (ref 0.44–1.00)
GFR, Estimated: >60 mL/min (ref >60)
Glucose, Bld: 83 mg/dL (ref 70–99)
Potassium: 4.2 mmol/L (ref 3.5–5.1)
Sodium: 136 mmol/L (ref 135–145)
Total Bilirubin: 0.7 mg/dL (ref 0.3–1.2)
Total Protein: 7.5 g/dL (ref 6.5–8.1)

## 2020-09-03 LAB — CBC WITH DIFFERENTIAL/PLATELET
Abs Immature Granulocytes: 0.01 10*3/uL (ref 0.00–0.07)
Basophils Absolute: 0 10*3/uL (ref 0.0–0.1)
Basophils Relative: 0 %
Eosinophils Absolute: 0.1 10*3/uL (ref 0.0–0.5)
Eosinophils Relative: 4 %
HCT: 41.7 % (ref 36.0–46.0)
Hemoglobin: 13.6 g/dL (ref 12.0–15.0)
Immature Granulocytes: 0 %
Lymphocytes Relative: 32 %
Lymphs Abs: 1 10*3/uL (ref 0.7–4.0)
MCH: 31.9 pg (ref 26.0–34.0)
MCHC: 32.6 g/dL (ref 30.0–36.0)
MCV: 97.9 fL (ref 80.0–100.0)
Monocytes Absolute: 0.4 10*3/uL (ref 0.1–1.0)
Monocytes Relative: 11 %
Neutro Abs: 1.7 10*3/uL (ref 1.7–7.7)
Neutrophils Relative %: 53 %
Platelets: 157 10*3/uL (ref 150–400)
RBC: 4.26 MIL/uL (ref 3.87–5.11)
RDW: 14.4 % (ref 11.5–15.5)
WBC: 3.2 10*3/uL — ABNORMAL LOW (ref 4.0–10.5)
nRBC: 0 % (ref 0.0–0.2)

## 2020-09-03 LAB — URINALYSIS, ROUTINE W REFLEX MICROSCOPIC
Bilirubin Urine: NEGATIVE
Glucose, UA: NEGATIVE mg/dL
Hgb urine dipstick: NEGATIVE
Ketones, ur: NEGATIVE mg/dL
Nitrite: NEGATIVE
Protein, ur: NEGATIVE mg/dL
Specific Gravity, Urine: 1.005 (ref 1.005–1.030)
pH: 7 (ref 5.0–8.0)

## 2020-09-03 LAB — LIPASE, BLOOD: Lipase: 27 U/L (ref 11–51)

## 2020-09-03 NOTE — ED Notes (Signed)
Call to son Ronalee Belts [phone]) who states his mother has COVID and has been sweating a lot, denies fever. Ronalee Belts states she keeps her eyes closed all the time and won't hardly talk to anyone. He states he thinks she has pneumonia because she is wheezing. He states she has been complaining of left shoulder pain and that she takes her sling off at night. He states her urine has a smell to it but she had a BM yesterday that was normal in color and form. Ronalee Belts states that she has not been eating much, just 1-2 bites per meal. She is normally alter and oriented, no hx of dementia.

## 2020-09-03 NOTE — ED Triage Notes (Signed)
Per [facility] pt was sent to the ER due to abdominal pain. Pt stating that she does not have any pain at this time.

## 2020-09-03 NOTE — ED Provider Notes (Addendum)
[facility] EMERGENCY DEPARTMENT Provider Note   CSN: [id number] Arrival date & time: 09/03/20  1216     History Chief Complaint  Patient presents with  . Abdominal Pain    Crystal Browning is a 87 y.o. female.  HPI   87 year old female with past medical history of HTN, atrial fibrillation anticoagulated on Eliquis, hypothyroidism, recent Covid infection presents the emergency department for reported abdominal pain.  Patient states that she feels well does not know why she is here.  Reportedly the sister-in-law called the ambulance because she has had decreased appetite, strong smelling urine and complained of an upset stomach.  Currently the patient has no complaints.  There is been no report of fever, vomiting or diarrhea.  Patient denies any chest pain or shortness of breath.  She admits that she feels fatigued but again, has no specific complaints to me at this time.  Past Medical History:  Diagnosis Date  . Arthritis   . Atrial fibrillation ([place]) 10/30/2016  . Bilateral lower extremity edema 10/30/2016  . Cancer ([place]) 03/28/11   SCCA of the Supraglottic Larynx (T2NoMo)    ; S/P Chemoradiation therapy from 05/09/05 thru 06/29/05  . CAP (community acquired pneumonia) 10/04/2019  . Depression    History of Depression- Lexapro therapy  . Diverticulosis 02/2005   History of diverticulosis with admission from 8/18 - 03/04/2005  . Esophageal stricture 03/03/2018  . Generalized abdominal pain 07/17/2015  . History of chemotherapy    S/P chemotherapy with Dr. Sonny Dandy -2006  . History of external beam radiation therapy 08/20/2016  . Hypertension   . Hypothyroid   . Osteoporosis    History of Osteoporosis with one year of Actonel only  . 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 Radiation    S/P radiation thrapy -06/2005  . TIA (transient ischemic attack)    History of TIA's with no residual effect    Patient Active Problem List   Diagnosis Date Noted  . Pharyngoesophageal  dysphagia 01/14/2018  . Long term (current) use of anticoagulants 12/14/2016  . Atrial fibrillation ([place]) 10/30/2016  . Other fatigue 10/30/2016  . Venous insufficiency of both lower extremities 10/30/2016  . History of laryngeal cancer 08/20/2016  . Xerostomia 08/20/2016  . Postmenopausal 07/17/2015  . Thyroid activity decreased 07/17/2015  . Essential hypertension 07/12/2015  . Angina pectoris ([place]) 07/12/2015  . Vitamin D deficiency 07/12/2015    Past Surgical History:  Procedure Laterality Date  . CARDIOVERSION N/A 11/23/2016   Procedure: CARDIOVERSION;  Surgeon: Pixie Casino, MD;  Location: [facility] ENDOSCOPY;  Service: Cardiovascular;  Laterality: N/A;  . CATARACT EXTRACTION, BILATERAL    . DIRECT LARYNGOSCOPY  03/27/2005   S/P direct laryngoscopy, esophagoscopy and biopsy with Dr. Constance Holster  . TOTAL ABDOMINAL HYSTERECTOMY W/ BILATERAL SALPINGOOPHORECTOMY  1986  . TUBAL LIGATION       OB History   No obstetric history on file.     Family History  Problem Relation Age of Onset  . Stroke Mother   . Heart disease Mother 73  . Cancer Father 53       Prostate  . Alcohol abuse Brother   . COPD Brother     Social History   Tobacco Use  . Smoking status: Never Smoker  . Smokeless tobacco: Never Used  Vaping Use  . Vaping Use: Never used  Substance Use Topics  . Alcohol use: No  . Drug use: No    Home Medications Prior to Admission medications  Medication Sig Start Date End Date Taking? Authorizing Provider  acetaminophen (TYLENOL) 325 MG tablet Take 2 tablets (650 mg total) by mouth every 6 (six) hours as needed for mild pain, fever or headache. 10/07/19   Roxan Hockey, MD  apixaban (ELIQUIS) 2.5 MG TABS tablet Take 1 tablet (2.5 mg total) by mouth 2 (two) times daily. 07/11/20   [name], MD  B Complex-Folic Acid (B COMPLEX-VITAMIN B12 PO) Take by mouth.    [provider]  ELIQUIS 5 MG TABS tablet TAKE 1 TABLET BY MOUTH TWICE A DAY 08/15/20    [name], MD  escitalopram (LEXAPRO) 10 MG tablet Take 1 tablet (10 mg total) by mouth daily. 08/14/20   [name], MD  famotidine (PEPCID) 20 MG tablet Take 1 tablet (20 mg total) by mouth every 12 (twelve) hours. 03/30/20   [name], MD  levothyroxine (SYNTHROID) 50 MCG tablet Take 1 tablet (50 mcg total) by mouth daily. 07/11/20   [name], MD  loratadine (CLARITIN) 5 MG/5ML syrup Take by mouth daily.    [provider]  metoprolol succinate (TOPROL-XL) 100 MG 24 hr tablet Take with or immediately following a meal. 03/30/20   [name], MD  polyethylene glycol powder (GLYCOLAX/MIRALAX) 17 GM/SCOOP powder Take 17 g by mouth 2 (two) times daily. For constipation 10/07/19   Roxan Hockey, MD    Allergies    Amlodipine besylate, Tuberculin tests, Vit d-vit e-safflower oil, Claritin [loratadine], and Paroxetine hcl  Review of Systems   Review of Systems  Constitutional: Positive for appetite change, chills and fatigue. Negative for fever.  HENT: Negative for congestion.   Eyes: Negative for visual disturbance.  Respiratory: Negative for shortness of breath.   Cardiovascular: Negative for chest pain.  Gastrointestinal: Negative for abdominal pain, diarrhea, nausea and vomiting.  Genitourinary: Negative for dysuria.  Skin: Negative for rash.  Neurological: Negative for headaches.    Physical Exam Updated Vital Signs BP (!) 148/78   Pulse 62   Temp 97.8 F (36.6 C) (Oral)   Resp 14   SpO2 96%   Physical Exam Vitals and nursing note reviewed.  Constitutional:      Appearance: Normal appearance.  HENT:     Head: Normocephalic.     Mouth/Throat:     Mouth: Mucous membranes are moist.  Cardiovascular:     Rate and Rhythm: Normal rate.  Pulmonary:     Effort: Pulmonary effort is normal. No respiratory distress.  Abdominal:     Palpations: Abdomen is soft.     Tenderness: There is no abdominal tenderness. There is no guarding or rebound.      Hernia: No hernia is present.  Skin:    General: Skin is warm.  Neurological:     Mental Status: She is alert and oriented to person, place, and time. Mental status is at baseline.  Psychiatric:        Mood and Affect: Mood normal.     ED Results / Procedures / Treatments   Labs (all labs ordered are listed, but only abnormal results are displayed) Labs Reviewed  CBC WITH DIFFERENTIAL/PLATELET - Abnormal; Notable for the following components:      Result Value   WBC 3.2 (*)    All other components within normal limits  COMPREHENSIVE METABOLIC PANEL - Abnormal; Notable for the following components:   AST 51 (*)    All other components within normal limits  LIPASE, BLOOD  URINALYSIS, ROUTINE W REFLEX MICROSCOPIC    EKG  EKG Interpretation  Date/Time:  Saturday September 03 2020 12:30:35 EST Ventricular Rate:  69 PR Interval:    QRS Duration: 116 QT Interval:  437 QTC Calculation: 469 R Axis:   -60 Text Interpretation: Atrial fibrillation Incomplete right bundle branch block Left ventricular hypertrophy Inferior infarct, old Afib, RBBB, unchanged from previous Confirmed by Lavenia Atlas [phone]) on 09/03/2020 12:51:27 PM   Radiology No results found.  Procedures Procedures   Medications Ordered in ED Medications - No data to display  ED Course  I have reviewed the triage vital signs and the nursing notes.  Pertinent labs & imaging results that were available during my care of the patient were reviewed by me and considered in my medical decision making (see chart for details).    MDM Rules/Calculators/A&P                          87 year old female presents the emergency department with reported upset stomach and strong smelling urine.  The patient herself is oriented, has no complaints.  Abdominal exam is completely benign, no tenderness.  She is afebrile with stable vitals.  Blood work is completely normal.  We are pending a straight cath for urinalysis evaluation.   Anticipate discharge home plus or minus antibiotics if urinalysis is suspicious for UTI.  Patient understands discharge plan.  Patient signed out to Dr. Langston Masker pending urinalysis.  Stable at time of signout.  Final Clinical Impression(s) / ED Diagnoses Final diagnoses:  None    Rx / DC Orders ED Discharge Orders    None       Lorelle Gibbs, DO 09/03/20 1545    Michaelann Gunnoe, Alvin Critchley, DO 09/03/20 1604

## 2020-09-03 NOTE — Discharge Instructions (Addendum)
You have been seen and discharged from the emergency department.  Follow-up with your primary provider for reevaluation.  If you have any worsening symptoms or further concerns for health please return to an emergency department for further evaluation.  Your blood tests and urine sample were reassuring and did not show signs of infection or significant injury.  *  Abdominal (belly) pain can be caused by many things. Your caregiver performed an examination and possibly ordered blood/urine tests and imaging (CT scan, x-rays, ultrasound). Many cases can be observed and treated at home after initial evaluation in the emergency department.   Even though you are being discharged home, abdominal pain can be unpredictable. Therefore, you need a repeated exam if your pain does not resolve, returns, or worsens. Most patients with abdominal pain don't have to be admitted to the hospital or have surgery, but serious problems like appendicitis and gallbladder attacks can start out as nonspecific pain. Many abdominal conditions cannot be diagnosed in one visit, so follow-up evaluations are very important.  SEEK IMMEDIATE MEDICAL ATTENTION IF: The pain does not go away or becomes severe.  A temperature above 101 develops.  Repeated vomiting occurs (multiple episodes).  The pain becomes localized to portions of the abdomen. The right side could possibly be appendicitis. In an adult, the left lower portion of the abdomen could be colitis or diverticulitis.  Blood is being passed in stools or vomit (bright red or black tarry stools).  Return also if you develop chest pain, difficulty breathing, dizziness or fainting, or become confused, poorly responsive, or inconsolable (young children).

## 2020-09-03 NOTE — ED Notes (Signed)
Patient denies pain and is resting comfortably.  

## 2020-09-03 NOTE — ED Notes (Signed)
Son Legrand Como said to call him when pt is ready for discharge

## 2020-09-05 LAB — URINE CULTURE: Culture: NO GROWTH

## 2020-09-06 ENCOUNTER — Ambulatory Visit: Payer: Medicare Other | Admitting: Orthopedic Surgery

## 2020-09-07 DIAGNOSIS — S2232XD Fracture of one rib, left side, subsequent encounter for fracture with routine healing: Secondary | ICD-10-CM | POA: Diagnosis not present

## 2020-09-07 DIAGNOSIS — I4891 Unspecified atrial fibrillation: Secondary | ICD-10-CM | POA: Diagnosis not present

## 2020-09-07 DIAGNOSIS — W19XXXD Unspecified fall, subsequent encounter: Secondary | ICD-10-CM | POA: Diagnosis not present

## 2020-09-07 DIAGNOSIS — S42002D Fracture of unspecified part of left clavicle, subsequent encounter for fracture with routine healing: Secondary | ICD-10-CM | POA: Diagnosis not present

## 2020-09-07 DIAGNOSIS — I10 Essential (primary) hypertension: Secondary | ICD-10-CM | POA: Diagnosis not present

## 2020-09-07 DIAGNOSIS — N390 Urinary tract infection, site not specified: Secondary | ICD-10-CM | POA: Diagnosis not present

## 2020-09-08 ENCOUNTER — Telehealth: Payer: Self-pay | Admitting: Family Medicine

## 2020-09-08 ENCOUNTER — Other Ambulatory Visit: Payer: Self-pay | Admitting: Family Medicine

## 2020-09-08 DIAGNOSIS — I4891 Unspecified atrial fibrillation: Secondary | ICD-10-CM

## 2020-09-09 DIAGNOSIS — N390 Urinary tract infection, site not specified: Secondary | ICD-10-CM | POA: Diagnosis not present

## 2020-09-09 DIAGNOSIS — S2232XD Fracture of one rib, left side, subsequent encounter for fracture with routine healing: Secondary | ICD-10-CM | POA: Diagnosis not present

## 2020-09-09 DIAGNOSIS — W19XXXD Unspecified fall, subsequent encounter: Secondary | ICD-10-CM | POA: Diagnosis not present

## 2020-09-09 DIAGNOSIS — I4891 Unspecified atrial fibrillation: Secondary | ICD-10-CM | POA: Diagnosis not present

## 2020-09-09 DIAGNOSIS — S42002D Fracture of unspecified part of left clavicle, subsequent encounter for fracture with routine healing: Secondary | ICD-10-CM | POA: Diagnosis not present

## 2020-09-09 DIAGNOSIS — I10 Essential (primary) hypertension: Secondary | ICD-10-CM | POA: Diagnosis not present

## 2020-09-12 DIAGNOSIS — W19XXXD Unspecified fall, subsequent encounter: Secondary | ICD-10-CM | POA: Diagnosis not present

## 2020-09-12 DIAGNOSIS — I4891 Unspecified atrial fibrillation: Secondary | ICD-10-CM | POA: Diagnosis not present

## 2020-09-12 DIAGNOSIS — S2232XD Fracture of one rib, left side, subsequent encounter for fracture with routine healing: Secondary | ICD-10-CM | POA: Diagnosis not present

## 2020-09-12 DIAGNOSIS — I10 Essential (primary) hypertension: Secondary | ICD-10-CM | POA: Diagnosis not present

## 2020-09-12 DIAGNOSIS — N390 Urinary tract infection, site not specified: Secondary | ICD-10-CM | POA: Diagnosis not present

## 2020-09-12 DIAGNOSIS — S42002D Fracture of unspecified part of left clavicle, subsequent encounter for fracture with routine healing: Secondary | ICD-10-CM | POA: Diagnosis not present

## 2020-09-13 ENCOUNTER — Ambulatory Visit (INDEPENDENT_AMBULATORY_CARE_PROVIDER_SITE_OTHER): Payer: Medicare Other | Admitting: Orthopedic Surgery

## 2020-09-13 ENCOUNTER — Other Ambulatory Visit: Payer: Self-pay

## 2020-09-13 ENCOUNTER — Encounter: Payer: Self-pay | Admitting: Orthopedic Surgery

## 2020-09-13 ENCOUNTER — Ambulatory Visit: Payer: Medicare Other

## 2020-09-13 VITALS — BP 135/65 | HR 60 | Ht 61.0 in | Wt 123.0 lb

## 2020-09-13 DIAGNOSIS — S42022D Displaced fracture of shaft of left clavicle, subsequent encounter for fracture with routine healing: Secondary | ICD-10-CM

## 2020-09-13 NOTE — Patient Instructions (Signed)
Ok to start working on ROM; passive ROM.  Limited motion over head for the next 2-3 weeks  Can start to come out of the sling; ok to remove completely in 1-2 weeks  No lifting anything greater than a coffee cup

## 2020-09-13 NOTE — Progress Notes (Signed)
[facility] Clinic Return  Assessment: Crystal Browning is a 87 y.o. female with the following: Displaced left clavicle fracture; pain and motion improving   Plan: Overall, her symptoms are improving.  Her pain has improved.  She is starting to remove the sling to do more activities.  At this point, I think this is reasonable.  I reviewed the radiographs in clinic which demonstrates no interval displacement.  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  In regards to her rib fractures, these are not readily visible on the radiographs today, however her symptoms are reassuring.  She not have any issues with breathing.  Based on the description of the pain that she is experiencing, it is difficult to discern if it is exclusively left shoulder pain caused by the clavicle fracture or if it is combination of the rib fractures.  Nonetheless, I have advised them to monitor the symptoms closely, and as long as her pain is controlled, and she has no issues with her breathing, they are doing the right things.  It is okay for her to start to come out of the sling on a more consistent basis.  She can start working on her passive range of motion with the assistance of home health physical therapy.  I have advised her not to lift anything heavier than a coffee cup for the next 2-3 weeks.  All questions were answered and they are amenable to this plan.  Follow-up in 4 weeks with repeat x-rays.   Follow-up: Return in about 4 weeks (around 10/11/2020).   Subjective:  Chief Complaint  Patient presents with  . Shoulder Pain    Left clavicle, Patient caregiver reports she has been taking this out of the sling some,      History of Present Illness: Crystal Browning is a 87 y.o. female who returns to clinic for repeat evaluation of her left shoulder.  Her pain continues to improve.  She is removing the sling and doing more activities with her left arm.   However, she does continue to have some pain.  This is controlled with appropriate medications.  She continues to use the incentive spirometer, and has had no issues with her breathing.  She is now back at home, living by herself.  She does have assistance from family members, and home health physical therapy is starting to work with her.  No numbness or tingling.  Review of Systems: No fevers or chills No numbness or tingling No bowel or bladder dysfunction No GI distress No headaches    Objective: BP 135/65   Pulse 60   Ht 5\' 1"  (1.549 m)   Wt 123 lb (55.8 kg)   BMI 23.24 kg/m   Physical Exam:  Elderly female.  No acute distress  No increased work of breathing on room air.   Evaluation of the left shoulder demonstrates no residual ecchymosis.  There is a palpable step-off within the midshaft of the clavicle.  There is no gross motion at the fracture site.  She does have some tenderness to palpation at the fracture site.  Passive forward flexion to beyond 90 degrees with some discomfort.  External rotation at her side easily gets to 30 degrees passively.  Active motion intact in the AIN/PIN/U nerve distributions.  Sensation is intact over the axillary patch.  IMAGING: I personally ordered and reviewed the following images:  X-ray of the left clavicle demonstrates 100% displacement  of the proximal fragment with shortening.  There has been no interval displacement.  There is no obvious callus formation at the fracture site.  There is no obvious further displacement of the rib fractures which were previously noted.  Impression: Stable left clavicle fracture with 100% displacement and greater than 2 cm of shortening.   Mordecai Rasmussen, MD 09/13/2020 9:56 AM

## 2020-09-14 DIAGNOSIS — S42002D Fracture of unspecified part of left clavicle, subsequent encounter for fracture with routine healing: Secondary | ICD-10-CM | POA: Diagnosis not present

## 2020-09-14 DIAGNOSIS — I4891 Unspecified atrial fibrillation: Secondary | ICD-10-CM | POA: Diagnosis not present

## 2020-09-14 DIAGNOSIS — I10 Essential (primary) hypertension: Secondary | ICD-10-CM | POA: Diagnosis not present

## 2020-09-14 DIAGNOSIS — N390 Urinary tract infection, site not specified: Secondary | ICD-10-CM | POA: Diagnosis not present

## 2020-09-14 DIAGNOSIS — S2232XD Fracture of one rib, left side, subsequent encounter for fracture with routine healing: Secondary | ICD-10-CM | POA: Diagnosis not present

## 2020-09-14 DIAGNOSIS — W19XXXD Unspecified fall, subsequent encounter: Secondary | ICD-10-CM | POA: Diagnosis not present

## 2020-09-15 ENCOUNTER — Encounter: Payer: Self-pay | Admitting: Family Medicine

## 2020-09-15 ENCOUNTER — Ambulatory Visit (INDEPENDENT_AMBULATORY_CARE_PROVIDER_SITE_OTHER): Payer: Medicare Other | Admitting: Family Medicine

## 2020-09-15 ENCOUNTER — Other Ambulatory Visit: Payer: Self-pay

## 2020-09-15 VITALS — BP 132/55 | HR 61 | Temp 97.8°F | Ht 61.0 in | Wt 123.8 lb

## 2020-09-15 DIAGNOSIS — N761 Subacute and chronic vaginitis: Secondary | ICD-10-CM

## 2020-09-15 DIAGNOSIS — S2232XD Fracture of one rib, left side, subsequent encounter for fracture with routine healing: Secondary | ICD-10-CM | POA: Diagnosis not present

## 2020-09-15 DIAGNOSIS — I10 Essential (primary) hypertension: Secondary | ICD-10-CM | POA: Diagnosis not present

## 2020-09-15 DIAGNOSIS — I4891 Unspecified atrial fibrillation: Secondary | ICD-10-CM | POA: Diagnosis not present

## 2020-09-15 DIAGNOSIS — N390 Urinary tract infection, site not specified: Secondary | ICD-10-CM | POA: Diagnosis not present

## 2020-09-15 DIAGNOSIS — T7840XA Allergy, unspecified, initial encounter: Secondary | ICD-10-CM

## 2020-09-15 DIAGNOSIS — W19XXXD Unspecified fall, subsequent encounter: Secondary | ICD-10-CM | POA: Diagnosis not present

## 2020-09-15 DIAGNOSIS — R32 Unspecified urinary incontinence: Secondary | ICD-10-CM | POA: Diagnosis not present

## 2020-09-15 DIAGNOSIS — S42002D Fracture of unspecified part of left clavicle, subsequent encounter for fracture with routine healing: Secondary | ICD-10-CM | POA: Diagnosis not present

## 2020-09-15 MED ORDER — PREDNISONE 10 MG (21) PO TBPK: ORAL_TABLET | ORAL | 0 refills | Status: DC

## 2020-09-15 MED ORDER — METHYLPREDNISOLONE ACETATE 40 MG/ML IJ SUSP
40.0000 mg | Freq: Once | INTRAMUSCULAR | Status: AC
  Administered 2020-09-15: 40 mg via INTRAMUSCULAR

## 2020-09-15 NOTE — Progress Notes (Signed)
Subjective: CC: tongue swelling PCP: [name], MD Crystal Browning is a 87 y.o. female presenting to clinic today for:  1. Tongue swelling Patient is brought to visit by her caregiver. She reports that patient complained of tongue swelling last evening after she took her evening meds.  The only new med was Benadryl, which she has taken before without difficulty.  She was previously on Claritin.  She reports that she felt somewhat short of breath last evening as well.  She is compliant with Pepcid.  Not on any ACE inhibitors or ARB's.  No new foods.  There is a questionable concern about switching from Tide detergent to a combination of other detergents.  She also switched her osteoporosis supplement recently but this swelling occurred separate from that switch.   ROS: Per HPI  Allergies  Allergen Reactions  . Amlodipine Besylate Hives    Hives and confusion.  . Tuberculin Tests Swelling  . Claritin [Loratadine] Rash    Pt's daughter states she is allergic to claritin  . Paroxetine Hcl Rash  . Vit D-Vit E-Safflower Oil    Past Medical History:  Diagnosis Date  . Arthritis   . Atrial fibrillation ([place]) 10/30/2016  . Bilateral lower extremity edema 10/30/2016  . 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 CAP (community acquired pneumonia) 10/04/2019  . Depression    History of Depression- Lexapro therapy  . Diverticulosis 02/2005   History of diverticulosis with admission from 8/18 - 03/04/2005  . Esophageal stricture 03/03/2018  . Generalized abdominal pain 07/17/2015  . History of chemotherapy    S/P chemotherapy with Dr. Sonny Dandy -2006  . History of external beam radiation therapy 08/20/2016  . Hypertension   . Hypothyroid   . Osteoporosis    History of Osteoporosis with one year of Actonel only  . 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 Radiation    S/P radiation thrapy  -06/2005  . TIA (transient ischemic attack)    History of TIA's with no residual effect    Current Outpatient Medications:  .  acetaminophen (TYLENOL) 325 MG tablet, Take 2 tablets (650 mg total) by mouth every 6 (six) hours as needed for mild pain, fever or headache., Disp: 12 tablet, Rfl: 0 .  apixaban (ELIQUIS) 2.5 MG TABS tablet, Take 1 tablet (2.5 mg total) by mouth 2 (two) times daily., Disp: 180 tablet, Rfl: 1 .  B Complex-Folic Acid (B COMPLEX-VITAMIN B12 PO), Take by mouth., Disp: , Rfl:  .  diphenhydrAMINE (BENADRYL ALLERGY CHILDRENS) 12.5 MG chewable tablet, Chew 12.5 mg by mouth 4 (four) times daily as needed for allergies., Disp: , Rfl:  .  ELIQUIS 5 MG TABS tablet, TAKE 1 TABLET BY MOUTH TWICE A DAY, Disp: 60 tablet, Rfl: 1 .  escitalopram (LEXAPRO) 10 MG tablet, Take 1 tablet (10 mg total) by mouth daily., Disp: 30 tablet, Rfl: 1 .  famotidine (PEPCID) 20 MG tablet, Take 1 tablet (20 mg total) by mouth every 12 (twelve) hours., Disp: 180 tablet, Rfl: 1 .  levothyroxine (SYNTHROID) 50 MCG tablet, Take 1 tablet (50 mcg total) by mouth daily., Disp: 90 tablet, Rfl: 1 .  metoprolol succinate (TOPROL-XL) 100 MG 24 hr tablet, Take with or immediately following a meal., Disp: 90 tablet, Rfl: 1 .  polyethylene glycol powder (GLYCOLAX/MIRALAX) 17 GM/SCOOP powder, Take 17 g by mouth 2 (two) times daily. For constipation,  Disp: 850 g, Rfl: 3 Social History   Socioeconomic History  . Marital status: Widowed    Spouse name: Not on file  . Number of children: 6  . Years of education: Not on file  . Highest education level: Not on file  Occupational History  . Not on file  Tobacco Use  . Smoking status: Never Smoker  . Smokeless tobacco: Never Used  Vaping Use  . Vaping Use: Never used  Substance and Sexual Activity  . Alcohol use: No  . Drug use: No  . Sexual activity: Not on file  Other Topics Concern  . Not on file  Social History Narrative  . Not on file   Social  Determinants of Health   Financial Resource Strain: Not on file  Food Insecurity: Not on file  Transportation Needs: Not on file  Physical Activity: Not on file  Stress: Not on file  Social Connections: Not on file  Intimate Partner Violence: Not on file   Family History  Problem Relation Age of Onset  . Stroke Mother   . Heart disease Mother 32  . Cancer Father 61       Prostate  . Alcohol abuse Brother   . COPD Brother     Objective: Office vital signs reviewed. BP (!) 132/55   Pulse 61   Temp 97.8 F (36.6 C) (Temporal)   Ht 5\' 1"  (1.549 m)   Wt 123 lb 12.8 oz (56.2 kg)   SpO2 98%   BMI 23.39 kg/m   Physical Examination:  General: Awake, alert, No acute distress HEENT: Normal; no facial swelling.  No mucosal swelling.  Oropharynx patent.  No tongue swelling. Cardio: regular rate  Pulm: No wheezes.  Normal work of breathing on room air  Assessment/ Plan: 87 y.o. female   Allergic reaction, initial encounter - Plan: predniSONE (STERAPRED UNI-PAK 21 TAB) 10 MG (21) TBPK tablet  Chronic vaginitis  Seemingly having had a recent allergic reaction.  She had no focal findings on exam.  She had normal work of breathing with normal oxygen saturation on room air.  No sublingual or mucosal edema noted.  No facial swelling or significant tongue swelling noted.  She had mild erythema of the tongue but again nothing that felt anaphylactic or severely allergic.  Because of her reports of feeling short of breath last evening and still feeling like her mouth feels funny I have given her a corticosteroid injection and placed on prednisone for the next several days.  Advised to discontinue use of the Benadryl since this seems to be the culprit of her allergic reaction and to switch over to a 24-hour second-generation like Claritin or Allegra.  We discussed the risk of Benadryl use in the elderly.  Additionally, counseled on barrier cream to reduce vaginitis given that she uses adult  diapers.  No orders of the defined types were placed in this encounter.  No orders of the defined types were placed in this encounter.    Janora Norlander, DO [place] [phone]

## 2020-09-15 NOTE — Addendum Note (Signed)
Addended by: Antonietta Barcelona D on: 09/15/2020 03:29 PM   Modules accepted: Orders

## 2020-09-15 NOTE — Patient Instructions (Signed)
Sounds like benadryl may have caused your swelling. STOP benadryl. This is not a good medication for elderly people to take anyways.  Start back on Claritin or used Allegra.  These are good 24 hour working medications for allergy and will help with your symptoms.  You were given a steroid shot today to help with your reaction A prednisone dosepak has been sent to your pharmacy. Start 09/16/20.  For your vaginal itching, keep yourself DRY.  Ok to use Desitin to protect our skin from urine if you use adult diapers.

## 2020-09-16 ENCOUNTER — Other Ambulatory Visit: Payer: Medicare Other

## 2020-09-16 DIAGNOSIS — R32 Unspecified urinary incontinence: Secondary | ICD-10-CM | POA: Diagnosis not present

## 2020-09-16 LAB — MICROSCOPIC EXAMINATION: Bacteria, UA: NONE SEEN

## 2020-09-16 LAB — URINALYSIS, COMPLETE
Bilirubin, UA: NEGATIVE
Glucose, UA: NEGATIVE
Ketones, UA: NEGATIVE
Leukocytes,UA: NEGATIVE
Nitrite, UA: NEGATIVE
Protein,UA: NEGATIVE
Specific Gravity, UA: 1.01 (ref 1.005–1.030)
Urobilinogen, Ur: 0.2 mg/dL (ref 0.2–1.0)
pH, UA: 6 (ref 5.0–7.5)

## 2020-09-16 NOTE — Addendum Note (Signed)
Addended by: Janora Norlander on: 09/16/2020 04:40 PM   Modules accepted: Orders

## 2020-09-19 ENCOUNTER — Encounter: Payer: Self-pay | Admitting: Family Medicine

## 2020-09-19 ENCOUNTER — Other Ambulatory Visit: Payer: Self-pay

## 2020-09-19 ENCOUNTER — Other Ambulatory Visit: Payer: Self-pay | Admitting: Family Medicine

## 2020-09-19 ENCOUNTER — Ambulatory Visit (INDEPENDENT_AMBULATORY_CARE_PROVIDER_SITE_OTHER): Payer: Medicare Other | Admitting: Family Medicine

## 2020-09-19 VITALS — BP 166/85 | HR 84 | Temp 98.1°F | Ht 61.0 in | Wt 123.0 lb

## 2020-09-19 DIAGNOSIS — R222 Localized swelling, mass and lump, trunk: Secondary | ICD-10-CM | POA: Diagnosis not present

## 2020-09-19 DIAGNOSIS — S2232XD Fracture of one rib, left side, subsequent encounter for fracture with routine healing: Secondary | ICD-10-CM | POA: Diagnosis not present

## 2020-09-19 DIAGNOSIS — S42002D Fracture of unspecified part of left clavicle, subsequent encounter for fracture with routine healing: Secondary | ICD-10-CM | POA: Diagnosis not present

## 2020-09-19 DIAGNOSIS — I4891 Unspecified atrial fibrillation: Secondary | ICD-10-CM | POA: Diagnosis not present

## 2020-09-19 DIAGNOSIS — I10 Essential (primary) hypertension: Secondary | ICD-10-CM | POA: Diagnosis not present

## 2020-09-19 DIAGNOSIS — N390 Urinary tract infection, site not specified: Secondary | ICD-10-CM | POA: Diagnosis not present

## 2020-09-19 DIAGNOSIS — W19XXXD Unspecified fall, subsequent encounter: Secondary | ICD-10-CM | POA: Diagnosis not present

## 2020-09-19 NOTE — Progress Notes (Unsigned)
Changed ultrasound order to what was needed

## 2020-09-19 NOTE — Progress Notes (Signed)
BP (!) 166/85   Pulse 84   Temp 98.1 F (36.7 C)   Ht 5\' 1"  (1.549 m)   Wt 123 lb (55.8 kg)   SpO2 97%   BMI 23.24 kg/m    Subjective:   Patient ID: Crystal Browning, female    DOB: [birth year], 87 y.o.   MRN: [id number]  HPI: Crystal Browning is a 87 y.o. female presenting on 09/19/2020 for nodule (RUQ, present for years. Enlarging recently)   HPI Patient is coming in with complaints of a lump on her right upper quadrant of her abdomen that has been there for years but has all of a sudden started growing over the past week or two that they have noticed.  They have said it started developed some branches off of it.  It is still soft and does not hurt her and has not showed any skin changes but it is enlarging and growing especially just recently and they are worried about that.  She is here today with her daughter-in-law who is the one who noticed it.  They think she may have a skin cancer removed from near that site in the past.  Relevant past medical, surgical, family and social history reviewed and updated as indicated. Interim medical history since our last visit reviewed. Allergies and medications reviewed and updated.  Review of Systems  Constitutional: Negative for chills and fever.  Cardiovascular: Negative for chest pain and leg swelling.  Musculoskeletal: Negative for back pain and gait problem.  Skin: Negative for rash.  Neurological: Negative for light-headedness and headaches.  Psychiatric/Behavioral: Negative for agitation and behavioral problems.  All other systems reviewed and are negative.   Per HPI unless specifically indicated above   Allergies as of 09/19/2020      Reactions   Amlodipine Besylate Hives   Hives and confusion.   Tuberculin Tests Swelling   Claritin [loratadine] Rash   Pt's daughter states she is allergic to claritin   Paroxetine Hcl Rash   Vit D-vit E-safflower Oil       Medication List       Accurate as of September 19, 2020 10:18 AM. If you  have any questions, ask your nurse or doctor.        STOP taking these medications   B COMPLEX-VITAMIN B12 PO Stopped by: Fransisca Kaufmann Dettinger, MD   diphenhydrAMINE 12.5 MG chewable tablet Commonly known as: BENADRYL Stopped by: Fransisca Kaufmann Dettinger, MD   polyethylene glycol powder 17 GM/SCOOP powder Commonly known as: GLYCOLAX/MIRALAX Stopped by: Fransisca Kaufmann Dettinger, MD     TAKE these medications   acetaminophen 325 MG tablet Commonly known as: TYLENOL Take 2 tablets (650 mg total) by mouth every 6 (six) hours as needed for mild pain, fever or headache.   Eliquis 5 MG Tabs tablet Generic drug: apixaban TAKE 1 TABLET BY MOUTH TWICE A DAY What changed: Another medication with the same name was removed. Continue taking this medication, and follow the directions you see here. Changed by: Fransisca Kaufmann Dettinger, MD   escitalopram 10 MG tablet Commonly known as: LEXAPRO Take 1 tablet (10 mg total) by mouth daily.   famotidine 20 MG tablet Commonly known as: PEPCID Take 1 tablet (20 mg total) by mouth every 12 (twelve) hours.   levothyroxine 50 MCG tablet Commonly known as: SYNTHROID Take 1 tablet (50 mcg total) by mouth daily.   loratadine 10 MG tablet Commonly known as: CLARITIN Take 10 mg by mouth daily.   metoprolol  succinate 100 MG 24 hr tablet Commonly known as: TOPROL-XL Take with or immediately following a meal.   predniSONE 10 MG (21) Tbpk tablet Commonly known as: STERAPRED UNI-PAK 21 TAB As directed x 6 days        Objective:   BP (!) 166/85   Pulse 84   Temp 98.1 F (36.7 C)   Ht 5\' 1"  (1.549 m)   Wt 123 lb (55.8 kg)   SpO2 97%   BMI 23.24 kg/m   Wt Readings from Last 3 Encounters:  09/19/20 123 lb (55.8 kg)  09/15/20 123 lb 12.8 oz (56.2 kg)  09/13/20 123 lb (55.8 kg)    Physical Exam Vitals and nursing note reviewed.  Constitutional:      Appearance: Normal appearance.  Skin:    Findings: No erythema, rash or wound.       Neurological:      Mental Status: She is alert.       Assessment & Plan:   Problem List Items Addressed This Visit   None   Visit Diagnoses    Subcutaneous nodule of abdominal wall    -  Primary   Relevant Orders   [place] MiscellaneoUS Localization      Will order ultrasound to look at this soft tissue nodule, very soft and mobile, possibly lipoma with extension Follow up plan: Return if symptoms worsen or fail to improve.  Counseling provided for all of the vaccine components Orders Placed This Encounter  Procedures  . [place] MiscellaneoUS Localization    Caryl Pina, MD [facility] 09/19/2020, 10:18 AM

## 2020-09-20 ENCOUNTER — Telehealth: Payer: Self-pay | Admitting: Family Medicine

## 2020-09-21 DIAGNOSIS — N390 Urinary tract infection, site not specified: Secondary | ICD-10-CM | POA: Diagnosis not present

## 2020-09-21 DIAGNOSIS — I4891 Unspecified atrial fibrillation: Secondary | ICD-10-CM | POA: Diagnosis not present

## 2020-09-21 DIAGNOSIS — W19XXXD Unspecified fall, subsequent encounter: Secondary | ICD-10-CM | POA: Diagnosis not present

## 2020-09-21 DIAGNOSIS — I10 Essential (primary) hypertension: Secondary | ICD-10-CM | POA: Diagnosis not present

## 2020-09-21 DIAGNOSIS — S42002D Fracture of unspecified part of left clavicle, subsequent encounter for fracture with routine healing: Secondary | ICD-10-CM | POA: Diagnosis not present

## 2020-09-21 DIAGNOSIS — S2232XD Fracture of one rib, left side, subsequent encounter for fracture with routine healing: Secondary | ICD-10-CM | POA: Diagnosis not present

## 2020-09-22 DIAGNOSIS — Z923 Personal history of irradiation: Secondary | ICD-10-CM | POA: Diagnosis not present

## 2020-09-22 DIAGNOSIS — Z8521 Personal history of malignant neoplasm of larynx: Secondary | ICD-10-CM | POA: Diagnosis not present

## 2020-09-24 DIAGNOSIS — Z9181 History of falling: Secondary | ICD-10-CM | POA: Diagnosis not present

## 2020-09-24 DIAGNOSIS — N390 Urinary tract infection, site not specified: Secondary | ICD-10-CM | POA: Diagnosis not present

## 2020-09-24 DIAGNOSIS — S42002D Fracture of unspecified part of left clavicle, subsequent encounter for fracture with routine healing: Secondary | ICD-10-CM | POA: Diagnosis not present

## 2020-09-24 DIAGNOSIS — F32A Depression, unspecified: Secondary | ICD-10-CM | POA: Diagnosis not present

## 2020-09-24 DIAGNOSIS — W19XXXD Unspecified fall, subsequent encounter: Secondary | ICD-10-CM | POA: Diagnosis not present

## 2020-09-24 DIAGNOSIS — M810 Age-related osteoporosis without current pathological fracture: Secondary | ICD-10-CM | POA: Diagnosis not present

## 2020-09-24 DIAGNOSIS — I4891 Unspecified atrial fibrillation: Secondary | ICD-10-CM | POA: Diagnosis not present

## 2020-09-24 DIAGNOSIS — S2232XD Fracture of one rib, left side, subsequent encounter for fracture with routine healing: Secondary | ICD-10-CM | POA: Diagnosis not present

## 2020-09-24 DIAGNOSIS — I10 Essential (primary) hypertension: Secondary | ICD-10-CM | POA: Diagnosis not present

## 2020-09-24 DIAGNOSIS — M1990 Unspecified osteoarthritis, unspecified site: Secondary | ICD-10-CM | POA: Diagnosis not present

## 2020-09-24 DIAGNOSIS — E039 Hypothyroidism, unspecified: Secondary | ICD-10-CM | POA: Diagnosis not present

## 2020-09-24 DIAGNOSIS — Z7901 Long term (current) use of anticoagulants: Secondary | ICD-10-CM | POA: Diagnosis not present

## 2020-09-24 DIAGNOSIS — Z8673 Personal history of transient ischemic attack (TIA), and cerebral infarction without residual deficits: Secondary | ICD-10-CM | POA: Diagnosis not present

## 2020-09-26 DIAGNOSIS — S42002D Fracture of unspecified part of left clavicle, subsequent encounter for fracture with routine healing: Secondary | ICD-10-CM | POA: Diagnosis not present

## 2020-09-26 DIAGNOSIS — S2232XD Fracture of one rib, left side, subsequent encounter for fracture with routine healing: Secondary | ICD-10-CM | POA: Diagnosis not present

## 2020-09-26 DIAGNOSIS — I10 Essential (primary) hypertension: Secondary | ICD-10-CM | POA: Diagnosis not present

## 2020-09-26 DIAGNOSIS — N390 Urinary tract infection, site not specified: Secondary | ICD-10-CM | POA: Diagnosis not present

## 2020-09-26 DIAGNOSIS — I4891 Unspecified atrial fibrillation: Secondary | ICD-10-CM | POA: Diagnosis not present

## 2020-09-26 DIAGNOSIS — W19XXXD Unspecified fall, subsequent encounter: Secondary | ICD-10-CM | POA: Diagnosis not present

## 2020-09-27 ENCOUNTER — Ambulatory Visit (HOSPITAL_COMMUNITY)
Admission: RE | Admit: 2020-09-27 | Discharge: 2020-09-27 | Disposition: A | Discharge status: Home / Self Care | Payer: Medicare Other | Source: Ambulatory Visit | Attending: Family Medicine | Admitting: Family Medicine

## 2020-09-27 DIAGNOSIS — R222 Localized swelling, mass and lump, trunk: Secondary | ICD-10-CM | POA: Insufficient documentation

## 2020-09-27 DIAGNOSIS — R1901 Right upper quadrant abdominal swelling, mass and lump: Secondary | ICD-10-CM | POA: Diagnosis not present

## 2020-09-27 IMAGING — US US ABDOMEN LIMITED RUQ/ASCITES
1 series · 14 of 14 positions shown · non-contrast
Comparison: None.

CLINICAL DATA: Lump in the right upper quadrant for 30 years.

EXAM:
ULTRASOUND ABDOMEN LIMITED

[Series 1: us abdomen limited · 14 acquisitions, 14 frames shown]
[im 1/14]
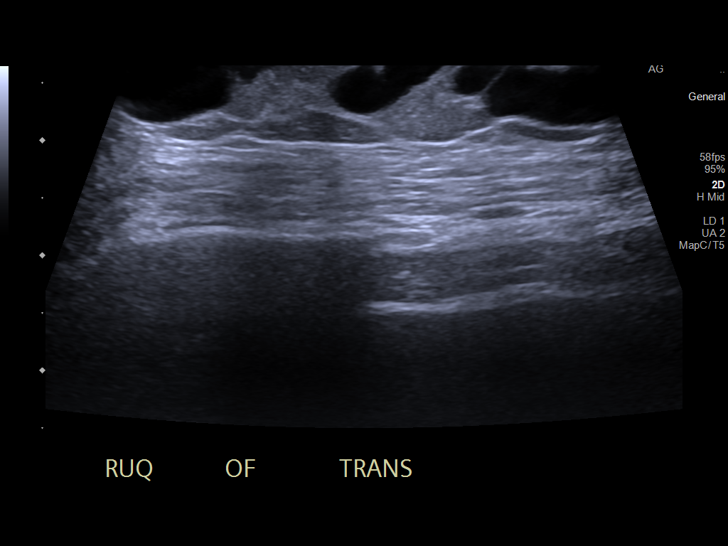
[im 2/14]
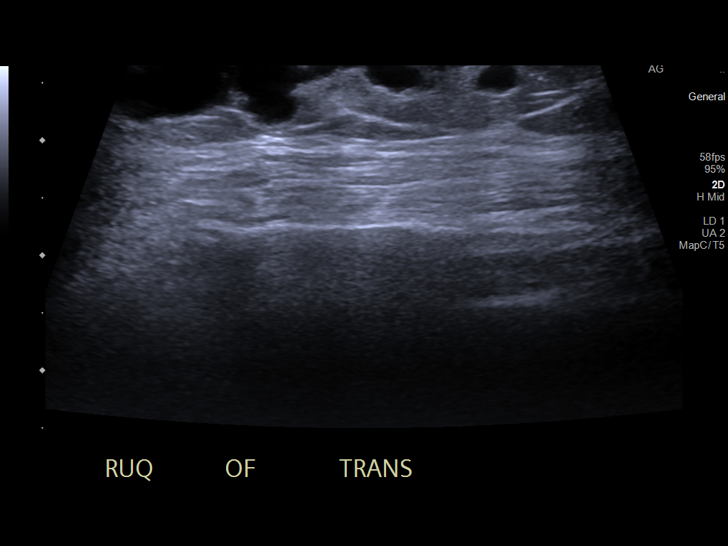
[im 3/14]
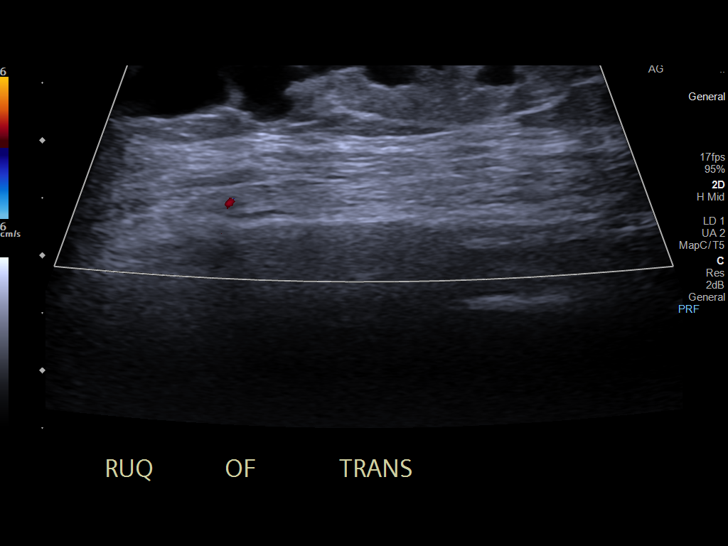
[im 4/14]
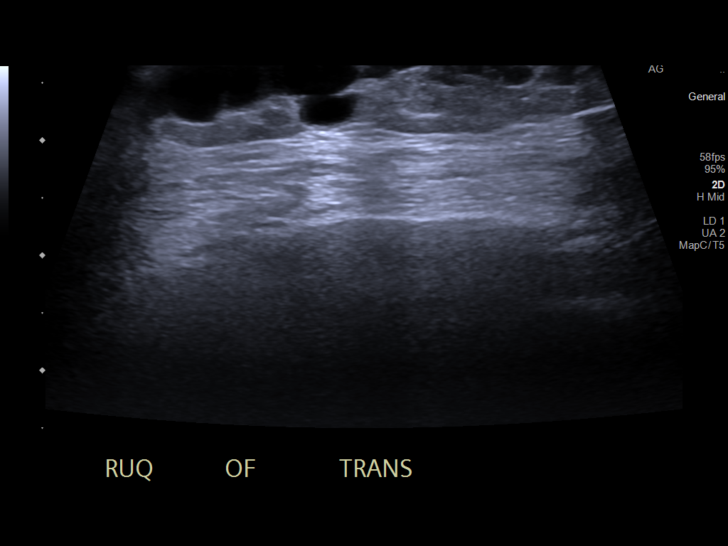
[im 5/14]
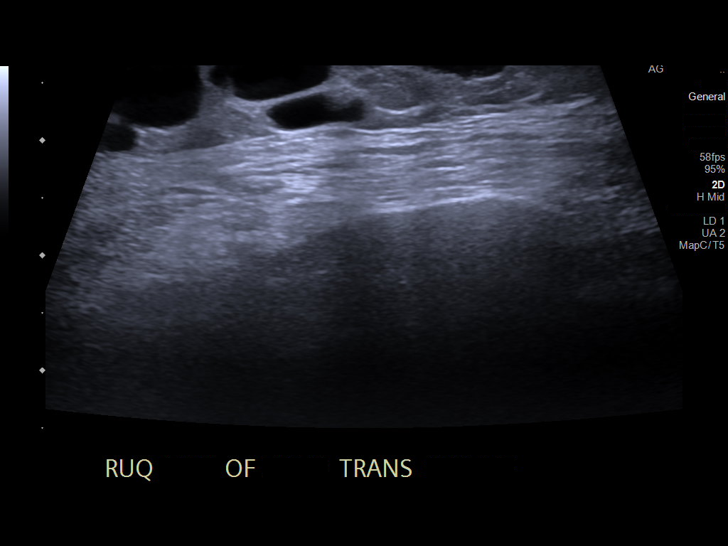
[im 6/14]
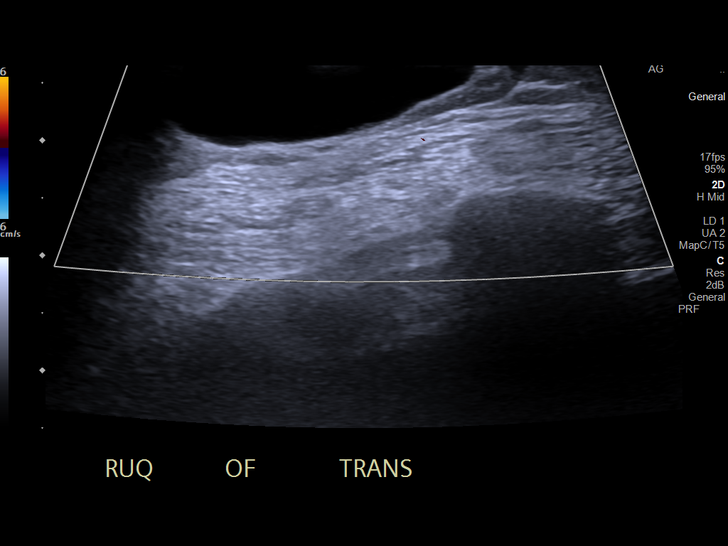
[im 7/14]
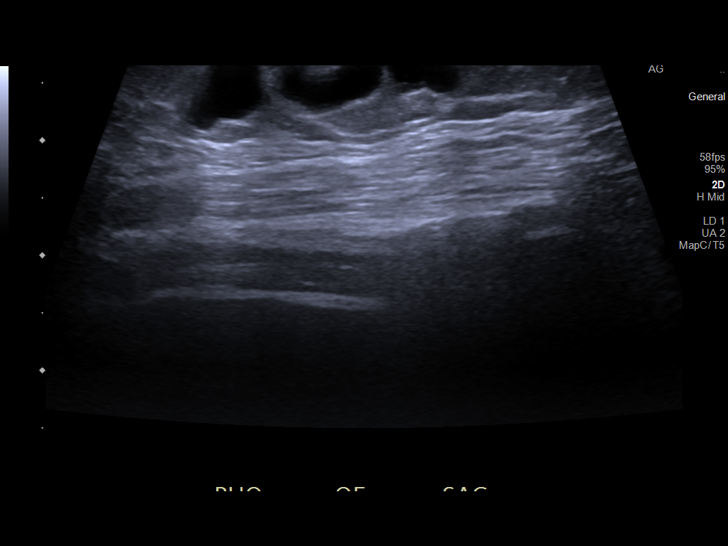
[im 8/14]
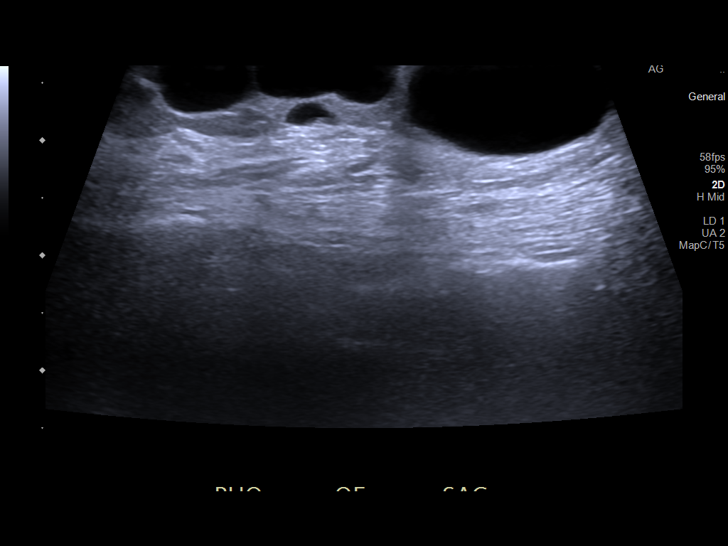
[im 9/14]
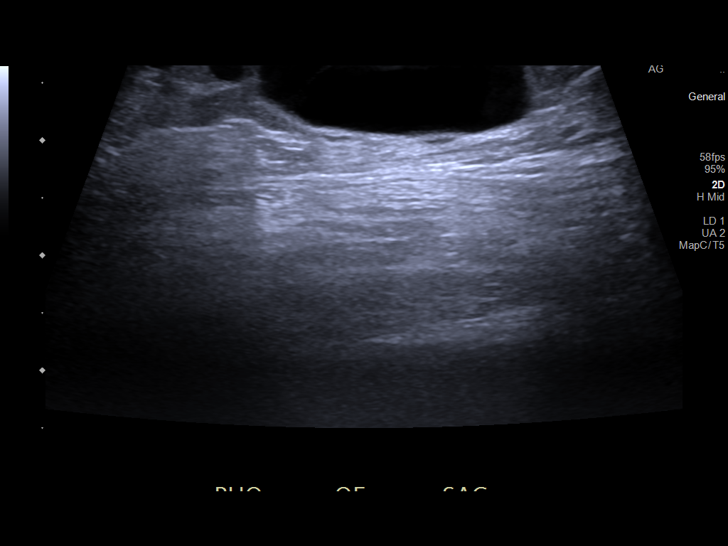
[im 10/14]
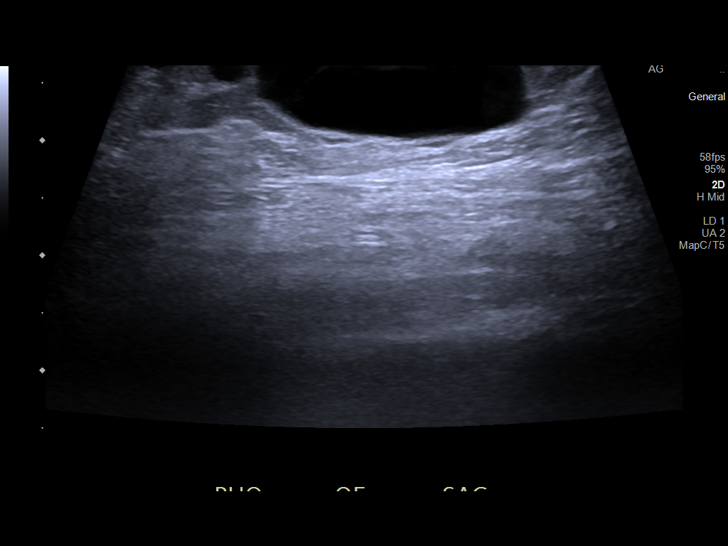
[im 11/14]
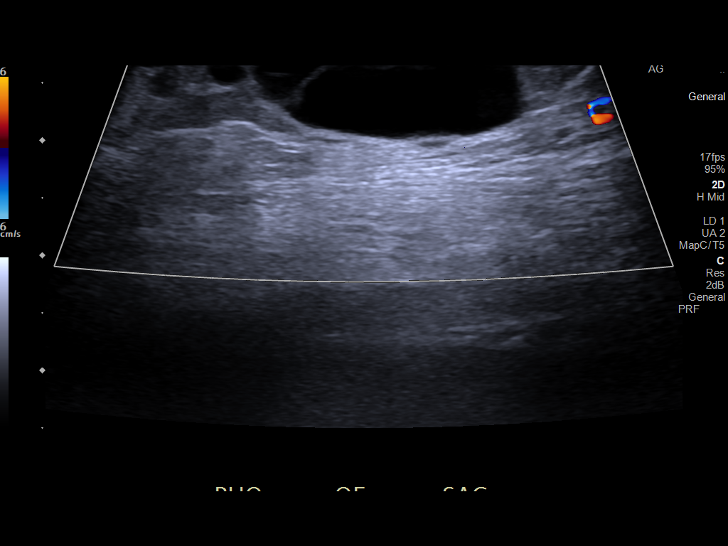
[im 12/14]
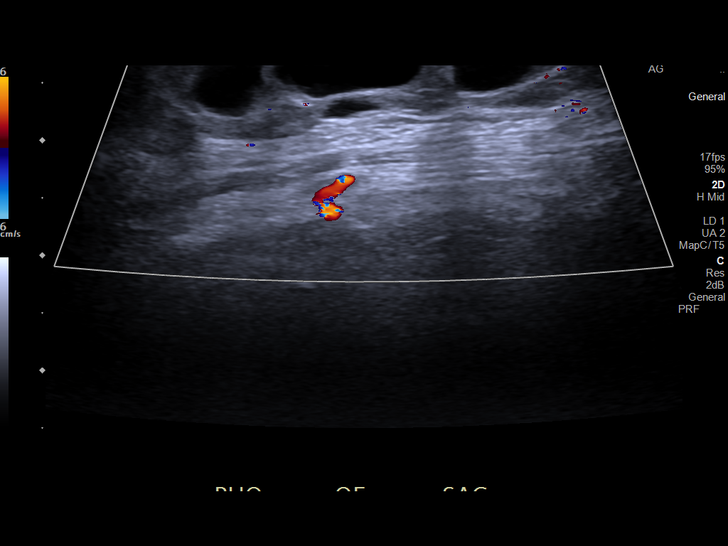
[im 13/14]
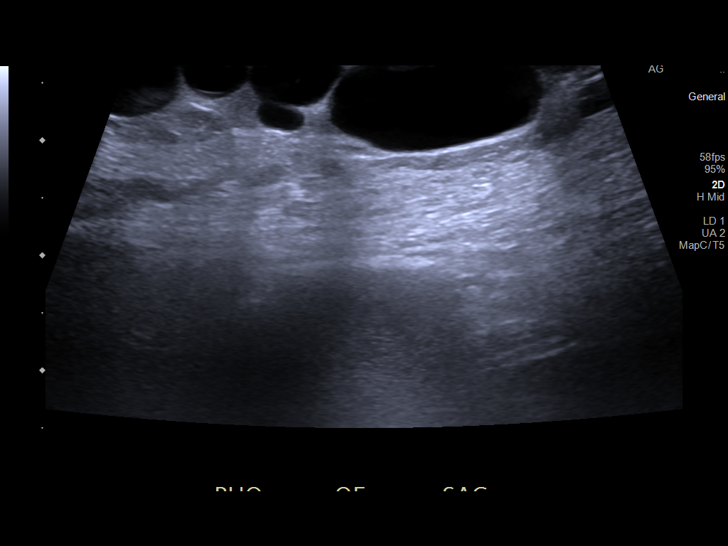
[im 14/14]
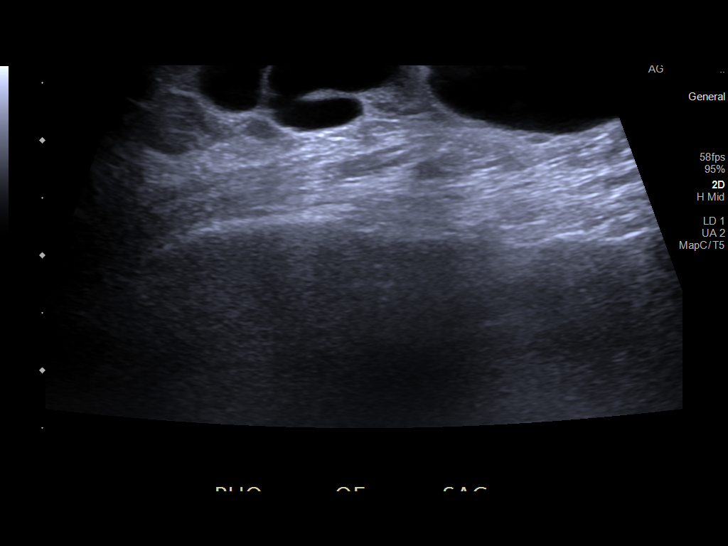

[14 of 14 positions shown; findings below may reference images not displayed]

FINDINGS: The patient's palpable area of concern in the right abdomen
corresponds to multiple serpiginous fluid-filled structures located
within the subcutaneous fat. On the provided color Doppler imaging
there does not appear to be significant flow within the structures.
This abnormality was detected on the patient's CT from [4R] and
appears grossly given differences in modality.
IMPRESSION: The patient's palpable area of concern corresponds to multiple
fluid-filled serpiginous structures in the subcutaneous fat of the
patient's right flank. Differential considerations include a venous
or lymphatic malformation. This finding appears relatively stable
since prior CT in [4R]. No further follow-up is required.

## 2020-09-29 DIAGNOSIS — I4891 Unspecified atrial fibrillation: Secondary | ICD-10-CM | POA: Diagnosis not present

## 2020-09-29 DIAGNOSIS — W19XXXD Unspecified fall, subsequent encounter: Secondary | ICD-10-CM | POA: Diagnosis not present

## 2020-09-29 DIAGNOSIS — S42002D Fracture of unspecified part of left clavicle, subsequent encounter for fracture with routine healing: Secondary | ICD-10-CM | POA: Diagnosis not present

## 2020-09-29 DIAGNOSIS — S2232XD Fracture of one rib, left side, subsequent encounter for fracture with routine healing: Secondary | ICD-10-CM | POA: Diagnosis not present

## 2020-09-29 DIAGNOSIS — I10 Essential (primary) hypertension: Secondary | ICD-10-CM | POA: Diagnosis not present

## 2020-09-29 DIAGNOSIS — N390 Urinary tract infection, site not specified: Secondary | ICD-10-CM | POA: Diagnosis not present

## 2020-10-10 ENCOUNTER — Ambulatory Visit (INDEPENDENT_AMBULATORY_CARE_PROVIDER_SITE_OTHER): Payer: Medicare Other | Admitting: Family Medicine

## 2020-10-10 ENCOUNTER — Other Ambulatory Visit: Payer: Self-pay | Admitting: Family Medicine

## 2020-10-10 ENCOUNTER — Other Ambulatory Visit: Payer: Self-pay

## 2020-10-10 ENCOUNTER — Encounter: Payer: Self-pay | Admitting: Family Medicine

## 2020-10-10 DIAGNOSIS — R300 Dysuria: Secondary | ICD-10-CM

## 2020-10-10 DIAGNOSIS — R32 Unspecified urinary incontinence: Secondary | ICD-10-CM | POA: Diagnosis not present

## 2020-10-10 DIAGNOSIS — W19XXXA Unspecified fall, initial encounter: Secondary | ICD-10-CM

## 2020-10-10 LAB — URINALYSIS, COMPLETE
Bilirubin, UA: NEGATIVE
Glucose, UA: NEGATIVE
Ketones, UA: NEGATIVE
Leukocytes,UA: NEGATIVE
Nitrite, UA: NEGATIVE
Protein,UA: NEGATIVE
Specific Gravity, UA: <1.005 — ABNORMAL LOW (ref 1.005–1.030)
Urobilinogen, Ur: 0.2 mg/dL (ref 0.2–1.0)
pH, UA: 5.5 (ref 5.0–7.5)

## 2020-10-10 LAB — MICROSCOPIC EXAMINATION
Bacteria, UA: NONE SEEN
Epithelial Cells (non renal): NONE SEEN /hpf (ref 0–10)
RBC, Urine: NONE SEEN /hpf (ref 0–2)
WBC, UA: NONE SEEN /hpf (ref 0–5)

## 2020-10-10 MED ORDER — TOLTERODINE TARTRATE ER 2 MG PO CP24: 2.0000 mg | ORAL_CAPSULE | Freq: Every day | ORAL | 1 refills | Status: DC

## 2020-10-10 MED ORDER — SOLIFENACIN SUCCINATE 5 MG PO TABS: 5.0000 mg | ORAL_TABLET | Freq: Every day | ORAL | 3 refills | Status: DC

## 2020-10-10 NOTE — Patient Instructions (Signed)
Walk with your walker all the time. Otherwise you could fall and have a serious injury, such as a broken hip.

## 2020-10-10 NOTE — Progress Notes (Signed)
Subjective:  Patient ID: Crystal Browning, female    DOB: 10/31/1933  Age: 87 y.o. MRN: [id number]  CC: Dysuria   HPI Crystal Browning presents for long term urinary incontinence. She has been having some burning as well. Not sure about urgency. Wearing depends. Denies fever.   [place], hurt left shoulder several weeks ago. Still hurting. Seeing ortho.  Her daughter is with her today and says they have cleared out of the house so that there are no obstacles to cause her to fall.  On the other hand, she she is not using a walker or cane to get around.  Depression screen [facility] 2/9 10/10/2020 10/10/2020 09/19/2020  Decreased Interest 0 0 0  Down, Depressed, Hopeless 1 0 0  PHQ - 2 Score 1 0 0  Altered sleeping 0 - -  Tired, decreased energy 0 - -  Change in appetite 3 - -  Feeling bad or failure about yourself  1 - -  Trouble concentrating 1 - -  Moving slowly or fidgety/restless 0 - -  Suicidal thoughts 0 - -  PHQ-9 Score 6 - -  Difficult doing work/chores Not difficult at all - -  Some recent data might be hidden    History Crystal Browning has a past medical history of Arthritis, Atrial fibrillation ([place]) (10/30/2016), Bilateral lower extremity edema (10/30/2016), Cancer ([place]) (03/28/11), CAP (community acquired pneumonia) (10/04/2019), Depression, Diverticulosis (02/2005), Esophageal stricture (03/03/2018), Generalized abdominal pain (07/17/2015), History of chemotherapy, History of external beam radiation therapy (08/20/2016), Hypertension, Hypothyroid, Osteoporosis, 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   She has a past surgical history that includes Direct laryngoscopy (03/27/2005); Tubal ligation; Total abdominal hysterectomy w/ bilateral salpingoophorectomy (1986); Cataract extraction, bilateral; and Cardioversion (N/A, 11/23/2016).   Her family history includes Alcohol abuse in her brother; COPD in her brother; Cancer (age of onset: 5) in her  father; Heart disease (age of onset: 4) in her mother; Stroke in her mother.She reports that she has never smoked. She has never used smokeless tobacco. She reports that she does not drink alcohol and does not use drugs.    ROS Review of Systems  Constitutional: Negative.   HENT: Negative.   Eyes: Negative for visual disturbance.  Respiratory: Negative for shortness of breath.   Cardiovascular: Negative for chest pain, palpitations and leg swelling.  Gastrointestinal: Negative for abdominal pain.  Musculoskeletal: Negative for arthralgias.    Objective:  BP (!) 163/73   Pulse 67   Temp 98 F (36.7 C)   Ht 5\' 1"  (1.549 m)   Wt 124 lb 9.6 oz (56.5 kg)   SpO2 97%   BMI 23.54 kg/m   BP Readings from Last 3 Encounters:  10/10/20 (!) 163/73  09/19/20 (!) 166/85  09/15/20 (!) 132/55    Wt Readings from Last 3 Encounters:  10/10/20 124 lb 9.6 oz (56.5 kg)  09/19/20 123 lb (55.8 kg)  09/15/20 123 lb 12.8 oz (56.2 kg)     Physical Exam Constitutional:      General: She is not in acute distress.    Appearance: She is well-developed.  Cardiovascular:     Rate and Rhythm: Normal rate and regular rhythm.  Pulmonary:     Breath sounds: Normal breath sounds.  Skin:    General: Skin is warm and dry.  Neurological:     Mental Status: She is alert and oriented to person, place, and time.       Assessment &  Plan:   Crystal Browning was seen today for dysuria.  Diagnoses and all orders for this visit:  Dysuria -     Urinalysis, Complete -     Urine Culture  Fall, initial encounter  Urinary incontinence, unspecified type -     Urine Culture  Other orders -     solifenacin (VESICARE) 5 MG tablet; Take 1 tablet (5 mg total) by mouth daily. -     Microscopic Examination       I have discontinued Crystal Browning predniSONE. I am also having her start on solifenacin. Additionally, [name] maintain her acetaminophen, metoprolol succinate, famotidine, levothyroxine,  escitalopram, Eliquis, and loratadine.  Allergies as of 10/10/2020      Reactions   Amlodipine Besylate Hives   Hives and confusion.   Tuberculin Tests Swelling   Claritin [loratadine] Rash   Pt's daughter states she is allergic to claritin   Paroxetine Hcl Rash   Vit D-vit E-safflower Oil       Medication List       Accurate as of October 10, 2020 10:59 AM. If you have any questions, ask your nurse or doctor.        STOP taking these medications   predniSONE 10 MG (21) Tbpk tablet Commonly known as: STERAPRED UNI-PAK 21 TAB Stopped by: [name], MD     TAKE these medications   acetaminophen 325 MG tablet Commonly known as: TYLENOL Take 2 tablets (650 mg total) by mouth every 6 (six) hours as needed for mild pain, fever or headache.   Eliquis 5 MG Tabs tablet Generic drug: apixaban TAKE 1 TABLET BY MOUTH TWICE A DAY   escitalopram 10 MG tablet Commonly known as: LEXAPRO Take 1 tablet (10 mg total) by mouth daily.   famotidine 20 MG tablet Commonly known as: PEPCID Take 1 tablet (20 mg total) by mouth every 12 (twelve) hours.   levothyroxine 50 MCG tablet Commonly known as: SYNTHROID Take 1 tablet (50 mcg total) by mouth daily.   loratadine 10 MG tablet Commonly known as: CLARITIN Take 10 mg by mouth daily.   metoprolol succinate 100 MG 24 hr tablet Commonly known as: TOPROL-XL Take with or immediately following a meal.   solifenacin 5 MG tablet Commonly known as: VESICARE Take 1 tablet (5 mg total) by mouth daily. Started by: [name], MD        Follow-up: Return in about 3 months (around 01/10/2021).  [name], M.D.

## 2020-10-10 NOTE — Telephone Encounter (Signed)
Pharmacy comment:  Alternative Requested:NOT COVERED BY PATIENT'S INSURANCE.  All Pharmacy Suggested Alternatives:   0 trospium (SANCTURA) 20 MG tablet 0 fesoterodine (TOVIAZ) 4 MG TB24 tablet 0 tolterodine (DETROL) 1 MG tablet 0 oxybutynin (DITROPAN-XL) 5 MG 24 hr tablet 0 tolterodine (DETROL LA) 2 MG 24 hr capsule

## 2020-10-11 ENCOUNTER — Ambulatory Visit: Payer: Medicare Other

## 2020-10-11 ENCOUNTER — Encounter: Payer: Self-pay | Admitting: Orthopedic Surgery

## 2020-10-11 ENCOUNTER — Ambulatory Visit (INDEPENDENT_AMBULATORY_CARE_PROVIDER_SITE_OTHER): Payer: Medicare Other | Admitting: Orthopedic Surgery

## 2020-10-11 VITALS — BP 162/84 | HR 80 | Ht 61.0 in | Wt 124.0 lb

## 2020-10-11 DIAGNOSIS — S42022D Displaced fracture of shaft of left clavicle, subsequent encounter for fracture with routine healing: Secondary | ICD-10-CM

## 2020-10-11 NOTE — Patient Instructions (Signed)
Rotator Cuff Tear/Tendinitis Rehab   Ask your health care provider which exercises are safe for you. Do exercises exactly as told by your health care provider and adjust them as directed. It is normal to feel mild stretching, pulling, tightness, or discomfort as you do these exercises. Stop right away if you feel sudden pain or your pain gets worse. Do not begin these exercises until told by your health care provider. Stretching and range-of-motion exercises  These exercises warm up your muscles and joints and improve the movement and flexibility of your shoulder. These exercises also help to relieve pain.  Shoulder pendulum In this exercise, you let the injured arm dangle toward the floor and then swing it like a clock pendulum. 1. Stand near a table or counter that you can hold onto for balance. 2. Bend forward at the waist and let your left / right arm hang straight down. Use your other arm to support you and help you stay balanced. 3. Relax your left / right arm and shoulder muscles, and move your hips and your trunk so your left / right arm swings freely. Your arm should swing because of the motion of your body, not because you are using your arm or shoulder muscles. 4. Keep moving your hips and trunk so your arm swings in the following directions, as told by your health care provider: ? Side to side. ? Forward and backward. ? In clockwise and counterclockwise circles. 5. Slowly return to the starting position. Repeat 10 times, or for 10 seconds per direction. Complete this exercise 2-3 times a day.      Shoulder flexion, seated This exercise is sometimes called table slides. In this exercise, you raise your arm in front of your body until you feel a stretch in your injured shoulder. 1. Sit in a stable chair so your left / right forearm can rest on a flat surface. Your elbow should rest at a height that keeps your upper arm next to your body. 2. Keeping your left / right shoulder relaxed,  lean forward at the waist and let your hand slide forward (flexion). Stop when you feel a stretch in your shoulder, or when you reach the angle that is recommended by your health care provider. 3. Hold for 5 seconds. 4. Slowly return to the starting position. Repeat 10 times. Complete this exercise 1-2  times a day.       Shoulder flexion, standing In this exercise, you raise your arm in front of your body (flexion) until you feel a stretch in your injured shoulder. 1. Stand and hold a broomstick, a cane, or a similar object. Place your hands a little more than shoulder-width apart on the object. Your left / right hand should be palm-up, and your other hand should be palm-down. 2. Keep your elbow straight and your shoulder muscles relaxed. Push the stick up with your healthy arm to raise your left / right arm in front of your body, and then over your head until you feel a stretch in your shoulder. ? Avoid shrugging your shoulder while you raise your arm. Keep your shoulder blade tucked down toward the middle of your back. ? Keep your left / right shoulder muscles relaxed. 3. Hold for 10 seconds. 4. Slowly return to the starting position. Repeat 10 times. Complete this exercise 1-2 times a day.      Shoulder abduction, active-assisted You will need a stick, broom handle, or similar object to help you (assist) in doing this  exercise. 1. Lie on your back. This is the supine position. Hold a broomstick, a cane, or a similar object. 2. Place your hands a little more than shoulder-width apart on the object. Your left / right hand should be palm-up, and your other hand should be palm-down. 3. Keeping your shoulder relaxed, push the stick to raise your left / right arm out to your side (abduction) and then over your head. Use your other hand to help move the stick. Stop when you feel a stretch in your shoulder, or when you reach the angle that is recommended by your health care provider. ? Avoid  shrugging your shoulder while you raise your arm. Keep your shoulder blade tucked down toward the middle of your back. 4. Hold for 10 seconds. 5. Slowly return to the starting position. Repeat 10 times. Complete this exercise 1-2 times a day.      Shoulder flexion, active-assisted 1. Lie on your back. You may bend your knees for comfort. 2. Hold a broomstick, a cane, or a similar object so that your hands are about shoulder-width apart. Your palms should face toward your feet. 3. Raise your left / right arm over your head, then behind your head toward the floor (flexion). Use your other hand to help you do this (active-assisted). Stop when you feel a gentle stretch in your shoulder, or when you reach the angle that is recommended by your health care provider. 4. Hold for 10 seconds. 5. Use the stick and your other arm to help you return your left / right arm to the starting position. Repeat 10 times. Complete this exercise 1-2 times a day.      External rotation 1. Sit in a stable chair without armrests, or stand up. 2. Tuck a soft object, such as a folded towel or a small ball, under your left / right upper arm. 3. Hold a broomstick, a cane, or a similar object with your palms face-down, toward the floor. Bend your elbows to a 90-degree angle (right angle), and keep your hands about shoulder-width apart. 4. Straighten your healthy arm and push the stick across your body, toward your left / right side. Keep your left / right arm bent. This will rotate your left / right forearm away from your body (external rotation). 5. Hold for 10 seconds. 6. Slowly return to the starting position. Repeat 10 times. Complete this exercise 1-2 times a day.        Strengthening exercises These exercises build strength and endurance in your shoulder. Endurance is the ability to use your muscles for a long time, even after they get tired. Do not start doing these exercises until your health care provider  approves. Shoulder flexion, isometric 1. Stand or sit in a doorway, facing the door frame. 2. Keep your left / right arm straight and make a gentle fist with your hand. Place your fist against the door frame. Only your fist should be touching the frame. Keep your upper arm at your side. 3. Gently press your fist against the door frame, as if you are trying to raise your arm above your head (isometric shoulder flexion). ? Avoid shrugging your shoulder while you press your hand into the door frame. Keep your shoulder blade tucked down toward the middle of your back. 4. Hold for 10 seconds. 5. Slowly release the tension, and relax your muscles completely before you repeat the exercise. Repeat 10 times. Complete this exercise 3 times per week.  Shoulder abduction, isometric 1. Stand or sit in a doorway. Your left / right arm should be closest to the door frame. 2. Keep your left / right arm straight, and place the back of your hand against the door frame. Only your hand should be touching the frame. Keep the rest of your arm close to your side. 3. Gently press the back of your hand against the door frame, as if you are trying to raise your arm out to the side (isometric shoulder abduction). ? Avoid shrugging your shoulder while you press your hand into the door frame. Keep your shoulder blade tucked down toward the middle of your back. 4. Hold for 10 seconds. 5. Slowly release the tension, and relax your muscles completely before you repeat the exercise. Repeat 10 times. Complete this exercise 3 times per week.      Internal rotation, isometric This is an exercise in which you press your palm against a door frame without moving your shoulder joint (isometric). 1. Stand or sit in a doorway, facing the door frame. 2. Bend your left / right elbow, and place the palm of your hand against the door frame. Only your palm should be touching the frame. Keep your upper arm at your side. 3. Gently press  your hand against the door frame, as if you are trying to push your arm toward your abdomen (internal rotation). Gradually increase the pressure until you are pressing as hard as you can. Stop increasing the pressure if you feel shoulder pain. ? Avoid shrugging your shoulder while you press your hand into the door frame. Keep your shoulder blade tucked down toward the middle of your back. 4. Hold for 10 seconds. 5. Slowly release the tension, and relax your muscles completely before you repeat the exercise. Repeat 10 times. Complete this exercise 3 times per week.      External rotation, isometric This is an exercise in which you press the back of your wrist against a door frame without moving your shoulder joint (isometric). 1. Stand or sit in a doorway, facing the door frame. 2. Bend your left / right elbow and place the back of your wrist against the door frame. Only the back of your wrist should be touching the frame. Keep your upper arm at your side. 3. Gently press your wrist against the door frame, as if you are trying to push your arm away from your abdomen (external rotation). Gradually increase the pressure until you are pressing as hard as you can. Stop increasing the pressure if you feel pain. ? Avoid shrugging your shoulder while you press your wrist into the door frame. Keep your shoulder blade tucked down toward the middle of your back. 4. Hold for 10 seconds. 5. Slowly release the tension, and relax your muscles completely before you repeat the exercise. Repeat 10 times. Complete this exercise 3 times per week.       Scapular retraction 1. Sit in a stable chair without armrests, or stand up. 2. Secure an exercise band to a stable object in front of you so the band is at shoulder height. 3. Hold one end of the exercise band in each hand. Your palms should face down. 4. Squeeze your shoulder blades together (retraction) and move your elbows slightly behind you. Do not shrug your  shoulders upward while you do this. 5. Hold for 10 seconds. 6. Slowly return to the starting position. Repeat 10 times. Complete this exercise 3 times per week.  Shoulder extension 1. Sit in a stable chair without armrests, or stand up. 2. Secure an exercise band to a stable object in front of you so the band is above shoulder height. 3. Hold one end of the exercise band in each hand. 4. Straighten your elbows and lift your hands up to shoulder height. 5. Squeeze your shoulder blades together and pull your hands down to the sides of your thighs (extension). Stop when your hands are straight down by your sides. Do not let your hands go behind your body. 6. Hold for 10 seconds. 7. Slowly return to the starting position. Repeat 10 times. Complete this exercise 3 times per week.       Scapular protraction, supine 1. Lie on your back on a firm surface (supine position). Hold a 5 lbs (or soup can) weight in your left / right hand. 2. Raise your left / right arm straight into the air so your hand is directly above your shoulder joint. 3. Push the weight into the air so your shoulder (scapula) lifts off the surface that you are lying on. The scapula will push up or forward (protraction). Do not move your head, neck, or back. 4. Hold for 10 seconds. 5. Slowly return to the starting position. Let your muscles relax completely before you repeat this exercise.  Repeat 10 times. Complete this exercise 3 times per week.

## 2020-10-11 NOTE — Progress Notes (Signed)
Orthopaedic Clinic Return  Assessment: Crystal Browning is a 87 y.o. female with the following: Displaced left clavicle fracture; pain and motion improving  Plan: Patient continues to improve following her injury.  On physical exam, there is no gross mobility at the fracture site.  She tolerates near full passive range of motion of the left shoulder.  In addition, she is progressively improving active motion of her left shoulder.  There is a mild deformity at the fracture site, and the shortening of the clavicle has changed her posture, which has become noticeable to the patient's family.  Overall, I think she has done well.  The strength and range of motion will continue to improve.  Based on my physical exam, and the radiographs obtained in clinic today, I am not concerned about a further injury.  She can continue to use her left arm as tolerated.  She can increase the amount that she is lifting as tolerated.  Regarding the rib fractures, I do not see any residual deformity.  The patient continues to use the incentive spirometer, and has not had any breathing issues.  I will not schedule a follow-up at this time, but she can return to clinic if she has any further issues.   Follow-up: Return if symptoms worsen or fail to improve.   Subjective:  Chief Complaint  Patient presents with  . Follow-up    Recheck on left clavicle fracture    History of Present Illness: Crystal Browning is a 87 y.o. female who returns to clinic for repeat evaluation of her left shoulder.  She continues to improve.  Her pain has improved.  She is now using her left arm more consistently.  According to family in the clinic today, she is starting to lift her arm over her head.  She is not take any medications on a consistent basis.  Home Health therapy has stopped coming to the house, as they are waiting for further guidance regarding her treatment.  No recent injuries.  She denies numbness and tingling.  She has been using  the incentive spirometer.  No issues with her breathing.   Review of Systems: No fevers or chills No numbness or tingling No bowel or bladder dysfunction No GI distress No headaches    Objective: BP (!) 162/84   Pulse 80   Ht 5\' 1"  (1.549 m)   Wt 124 lb (56.2 kg)   BMI 23.43 kg/m   Physical Exam:  Elderly female.  No acute distress.  Alert.  No increased work of breathing on room air.   Duration of her left shoulder demonstrates no bruising.  She has no tenderness to palpation over the fracture site.  There is no gross motion at the fracture site with deep palpation.  Active abduction to 90 degrees.  Active forward elevation to 100 degrees.  Passive forward flexion to 130 degrees.  Passive external rotation at her side to 60 degrees.  Active motion intact in the AIN/PIN/U nerve distributions.  Sensation is intact over the axillary patch.  Her deltoid fires.  Slight residual deformity over the left shoulder girdle related to shortening of the clavicle.  IMAGING: I personally ordered and reviewed the following images:  X-ray of the left clavicle was obtained in clinic today and compared to previous x-rays.  There has been no interval displacement.  There is no obvious callus formation at the fracture site.  There does appear to be some calcification possibly representing fibrous union.  Rib fractures remain  unchanged.  Impression: Stable left clavicle fracture with 100% displacement and greater than 2 cm of shortening.   Mordecai Rasmussen, MD 10/11/2020 12:19 PM

## 2020-10-13 ENCOUNTER — Telehealth: Payer: Self-pay | Admitting: Orthopedic Surgery

## 2020-10-13 DIAGNOSIS — S42022D Displaced fracture of shaft of left clavicle, subsequent encounter for fracture with routine healing: Secondary | ICD-10-CM

## 2020-10-13 NOTE — Telephone Encounter (Signed)
Call received from patient's designated contact, Ayda Tancredi, daughter-in-law, ph# [phone], requesting to have orders done for [facility], physical therapy; states had discussed at visit with Dr Amedeo Kinsman.

## 2020-10-13 NOTE — Telephone Encounter (Signed)
I have left a message for the patient's daughter in law and she was asked to call back with any questions.  I have placed the orders for the Physical Therapy.

## 2020-10-14 NOTE — Telephone Encounter (Signed)
I called and spoke with patient's daughter in law and she voices understanding. No other concerns.

## 2020-10-19 ENCOUNTER — Encounter: Payer: Self-pay | Admitting: Family Medicine

## 2020-10-19 ENCOUNTER — Ambulatory Visit (INDEPENDENT_AMBULATORY_CARE_PROVIDER_SITE_OTHER): Payer: Medicare Other | Admitting: Family Medicine

## 2020-10-19 DIAGNOSIS — J301 Allergic rhinitis due to pollen: Secondary | ICD-10-CM | POA: Diagnosis not present

## 2020-10-19 NOTE — Progress Notes (Signed)
    Subjective:    Patient ID: Crystal Browning, female    DOB: 05-Feb-1934, 87 y.o.   MRN: [id number]   HPI: Crystal Browning is a 87 y.o. female presenting for allergy symptoms. She tells me she is doing fine taking claritin. She has a little cough and occasional sneeze, but nothin gelse. She denies any other acute illness.     Relevant past medical, surgical, family and social history reviewed and updated as indicated.  Interim medical history since our last visit reviewed. Allergies and medications reviewed and updated.  ROS:  Review of Systems  Constitutional: Negative for fever.  HENT: Positive for congestion, rhinorrhea and sneezing. Negative for ear pain, postnasal drip and sinus pressure.   Eyes: Photophobia: occasional.  Respiratory: Positive for cough. Negative for shortness of breath.      Social History   Tobacco Use  Smoking Status Never Smoker  Smokeless Tobacco Never Used       Objective:     Wt Readings from Last 3 Encounters:  10/11/20 124 lb (56.2 kg)  10/10/20 124 lb 9.6 oz (56.5 kg)  09/19/20 123 lb (55.8 kg)     Exam deferred. Pt. Harboring due to COVID 19. Phone visit performed.   Assessment & Plan:   1. Seasonal allergic rhinitis due to pollen       Continue Claritin for now. Notify me if symptoms worsen    Diagnoses and all orders for this visit:  Seasonal allergic rhinitis due to pollen    Virtual Visit via telephone Note  I discussed the limitations, risks, security and privacy concerns of performing an evaluation and management service by telephone and the availability of in person appointments. The patient was identified with two identifiers. Pt.expressed understanding and agreed to proceed. Pt. Is at home. Dr. Livia Snellen is in his office.  Follow Up Instructions:   I discussed the assessment and treatment plan with the patient. The patient was provided an opportunity to ask questions and all were answered. The patient agreed with the  plan and demonstrated an understanding of the instructions.   The patient was advised to call back or seek an in-person evaluation if the symptoms worsen or if the condition fails to improve as anticipated.   Total minutes including chart review and phone contact time: 7   Follow up plan: Return if symptoms worsen or fail to improve.  [name], MD [place]

## 2020-10-24 ENCOUNTER — Telehealth: Payer: Self-pay

## 2020-10-24 NOTE — Telephone Encounter (Signed)
She lives by herself in a trailer - other people in [place] are getting woken up.  This weekend, 130am-200am she was saying people were outside trying to kill her, hearing voices, she was "fearing for her life" saying someone was there with a gun, seeing babies in the house.   Sister messed to some meds ( stacks aware)   On Claritin and not benadryl She did not like the vesicare - thinks it caused hallucinations.   appt made

## 2020-10-25 ENCOUNTER — Telehealth: Payer: Self-pay

## 2020-10-25 NOTE — Telephone Encounter (Signed)
Per daughter [place], appointment tomorrow is not needed and cancelled. Per daughter, there is some discrepancy in who should and should not be scheduling appointments for patient. Advised daughter that patient needs to sign an updated medical release with her wishes.

## 2020-10-26 ENCOUNTER — Encounter: Payer: Self-pay | Admitting: Family Medicine

## 2020-10-26 ENCOUNTER — Ambulatory Visit (INDEPENDENT_AMBULATORY_CARE_PROVIDER_SITE_OTHER): Payer: Medicare Other | Admitting: Family Medicine

## 2020-10-26 ENCOUNTER — Telehealth: Payer: Self-pay | Admitting: Radiology

## 2020-10-26 ENCOUNTER — Other Ambulatory Visit: Payer: Self-pay

## 2020-10-26 ENCOUNTER — Ambulatory Visit: Payer: Medicare Other | Admitting: Family Medicine

## 2020-10-26 VITALS — BP 139/56 | HR 51 | Temp 97.8°F | Ht 61.0 in | Wt 126.4 lb

## 2020-10-26 DIAGNOSIS — E039 Hypothyroidism, unspecified: Secondary | ICD-10-CM | POA: Diagnosis not present

## 2020-10-26 DIAGNOSIS — R441 Visual hallucinations: Secondary | ICD-10-CM

## 2020-10-26 DIAGNOSIS — R300 Dysuria: Secondary | ICD-10-CM | POA: Diagnosis not present

## 2020-10-26 DIAGNOSIS — S42022D Displaced fracture of shaft of left clavicle, subsequent encounter for fracture with routine healing: Secondary | ICD-10-CM

## 2020-10-26 LAB — URINALYSIS
Bilirubin, UA: NEGATIVE
Glucose, UA: NEGATIVE
Ketones, UA: NEGATIVE
Nitrite, UA: NEGATIVE
Protein,UA: NEGATIVE
RBC, UA: NEGATIVE
Specific Gravity, UA: 1.02 (ref 1.005–1.030)
Urobilinogen, Ur: 0.2 mg/dL (ref 0.2–1.0)
pH, UA: 6 (ref 5.0–7.5)

## 2020-10-26 MED ORDER — QUETIAPINE FUMARATE 25 MG PO TABS: 25.0000 mg | ORAL_TABLET | Freq: Every day | ORAL | 1 refills | Status: DC

## 2020-10-26 NOTE — Addendum Note (Signed)
Addended byCandice Camp on: 10/26/2020 11:27 AM   Modules accepted: Orders

## 2020-10-26 NOTE — Progress Notes (Signed)
Subjective:  Patient ID: Crystal Browning, female    DOB: January 15, 1934  Age: 87 y.o. MRN: [id number]  CC: No chief complaint on file.   HPI Crystal Browning presents for problems with hallucinations.  Her daughters bring her in.  She says that the oldest daughter, Crystal Browning says mean things to her daily over the phone.  It upsets her.  She feels this is why she has visions of waking up from bed and the next room being full of people having get together.  Other times she will wake up and get up and see kids sitting around on the bar of her kitchen and sitting on top of her refrigerator.  When she walks into the rooms there is no one there.  She has had problems for hallucinations for months.  And I have gotten conflicting history from multiple family members see past chart notes.  The current daughters who are with her tell me that she had hallucinations while on the Lexapro.  The oldest daughter apparently took her off of the Seroquel or even failed to start her on it when it was started before for this problem.  Crystal Browning does take thyroid medication and her daughters are concerned that perhaps there is a problem with her dosage that might be leading to some of these problems.  She requests that the levels be checked.  Depression screen [facility] 2/9 10/26/2020 10/10/2020 10/10/2020  Decreased Interest 0 0 0  Down, Depressed, Hopeless 0 1 0  PHQ - 2 Score 0 1 0  Altered sleeping - 0 -  Tired, decreased energy - 0 -  Change in appetite - 3 -  Feeling bad or failure about yourself  - 1 -  Trouble concentrating - 1 -  Moving slowly or fidgety/restless - 0 -  Suicidal thoughts - 0 -  PHQ-9 Score - 6 -  Difficult doing work/chores - Not difficult at all -  Some recent data might be hidden    History Eilah has a past medical history of Arthritis, Atrial fibrillation ([place]) (10/30/2016), Bilateral lower extremity edema (10/30/2016), Cancer ([place]) (03/28/11), CAP (community acquired pneumonia) (10/04/2019), Depression,  Diverticulosis (02/2005), Esophageal stricture (03/03/2018), Generalized abdominal pain (07/17/2015), History of chemotherapy, History of external beam radiation therapy (08/20/2016), Hypertension, Hypothyroid, Osteoporosis, 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   She has a past surgical history that includes Direct laryngoscopy (03/27/2005); Tubal ligation; Total abdominal hysterectomy w/ bilateral salpingoophorectomy (1986); Cataract extraction, bilateral; and Cardioversion (N/A, 11/23/2016).   Her family history includes Alcohol abuse in her brother; COPD in her brother; Cancer (age of onset: 60) in her father; Heart disease (age of onset: 90) in her mother; Stroke in her mother.She reports that she has never smoked. She has never used smokeless tobacco. She reports that she does not drink alcohol and does not use drugs.    ROS Review of Systems  Constitutional: Negative.   HENT: Negative.   Eyes: Negative for visual disturbance.  Respiratory: Negative for shortness of breath.   Cardiovascular: Negative for chest pain.  Gastrointestinal: Negative for abdominal pain.  Genitourinary: Positive for dysuria.  Musculoskeletal: Negative for arthralgias.    Objective:  BP (!) 139/56   Pulse (!) 51   Temp 97.8 F (36.6 C)   Ht '5\' 1"'  (1.549 m)   Wt 126 lb 6.4 oz (57.3 kg)   SpO2 96%   BMI 23.88 kg/m   BP Readings from Last 3  Encounters:  10/26/20 (!) 139/56  10/11/20 (!) 162/84  10/10/20 (!) 163/73    Wt Readings from Last 3 Encounters:  10/26/20 126 lb 6.4 oz (57.3 kg)  10/11/20 124 lb (56.2 kg)  10/10/20 124 lb 9.6 oz (56.5 kg)     Physical Exam Constitutional:      General: She is not in acute distress.    Appearance: She is well-developed.  HENT:     Head: Normocephalic and atraumatic.  Eyes:     Conjunctiva/sclera: Conjunctivae normal.     Pupils: Pupils are equal, round, and reactive to light.  Neck:      Thyroid: No thyromegaly.  Cardiovascular:     Rate and Rhythm: Normal rate and regular rhythm.     Heart sounds: Normal heart sounds. No murmur heard.   Pulmonary:     Effort: Pulmonary effort is normal. No respiratory distress.     Breath sounds: Normal breath sounds. No wheezing or rales.  Abdominal:     General: Bowel sounds are normal. There is no distension.     Palpations: Abdomen is soft.     Tenderness: There is no abdominal tenderness.  Musculoskeletal:        General: Normal range of motion.     Cervical back: Normal range of motion and neck supple.  Lymphadenopathy:     Cervical: No cervical adenopathy.  Skin:    General: Skin is warm and dry.  Neurological:     Mental Status: She is alert and oriented to person, place, and time.  Psychiatric:        Attention and Perception: Attention normal.        Mood and Affect: Mood is depressed. Affect is blunt.        Speech: Speech is delayed.        Behavior: Behavior is slowed. Behavior is cooperative.        Cognition and Memory: Cognition is impaired.       Assessment & Plan:   Diagnoses and all orders for this visit:  Hypothyroidism, unspecified type -     CBC with Differential/Platelet -     CMP14+EGFR -     TSH + free T4  Dysuria -     Urinalysis -     Urine Culture  Hallucinations, visual -     QUEtiapine (SEROQUEL) 25 MG tablet; Take 1 tablet (25 mg total) by mouth at bedtime.       I am having Latasha B. Kaster start on QUEtiapine. I am also having her maintain her acetaminophen, metoprolol succinate, famotidine, levothyroxine, escitalopram, Eliquis, loratadine, solifenacin, and fesoterodine.  Allergies as of 10/26/2020      Reactions   Amlodipine Besylate Hives   Hives and confusion.   Tuberculin Tests Swelling   Claritin [loratadine] Rash   Pt's daughter states she is allergic to claritin   Paroxetine Hcl Rash   Vit D-vit E-safflower Oil       Medication List       Accurate as of October 26, 2020  5:53 PM. If you have any questions, ask your nurse or doctor.        acetaminophen 325 MG tablet Commonly known as: TYLENOL Take 2 tablets (650 mg total) by mouth every 6 (six) hours as needed for mild pain, fever or headache.   Eliquis 5 MG Tabs tablet Generic drug: apixaban TAKE 1 TABLET BY MOUTH TWICE A DAY   escitalopram 10 MG tablet Commonly known as: LEXAPRO Take 1  tablet (10 mg total) by mouth daily.   famotidine 20 MG tablet Commonly known as: PEPCID Take 1 tablet (20 mg total) by mouth every 12 (twelve) hours.   fesoterodine 4 MG Tb24 tablet Commonly known as: Toviaz Take 1 tablet (4 mg total) by mouth daily.   levothyroxine 50 MCG tablet Commonly known as: SYNTHROID Take 1 tablet (50 mcg total) by mouth daily.   loratadine 10 MG tablet Commonly known as: CLARITIN Take 10 mg by mouth daily.   metoprolol succinate 100 MG 24 hr tablet Commonly known as: TOPROL-XL Take with or immediately following a meal.   QUEtiapine 25 MG tablet Commonly known as: SEROQUEL Take 1 tablet (25 mg total) by mouth at bedtime. Started by: [name], MD   solifenacin 5 MG tablet Commonly known as: VESICARE Take 1 tablet (5 mg total) by mouth daily.        Follow-up: Return in about 1 month (around 11/25/2020).  [name], M.D.

## 2020-10-26 NOTE — Telephone Encounter (Signed)
[facility] needed new order so their Start of care would be correct, they will accept her, and will start care soon.

## 2020-10-26 NOTE — Telephone Encounter (Signed)
Patient has declined for [facility] to do her home therapy, [facility]   I have resent to [facility] just want you to be aware of reason for delay.

## 2020-10-27 DIAGNOSIS — Z9181 History of falling: Secondary | ICD-10-CM | POA: Diagnosis not present

## 2020-10-27 DIAGNOSIS — Z7901 Long term (current) use of anticoagulants: Secondary | ICD-10-CM | POA: Diagnosis not present

## 2020-10-27 DIAGNOSIS — I4891 Unspecified atrial fibrillation: Secondary | ICD-10-CM | POA: Diagnosis not present

## 2020-10-27 DIAGNOSIS — M1990 Unspecified osteoarthritis, unspecified site: Secondary | ICD-10-CM | POA: Diagnosis not present

## 2020-10-27 DIAGNOSIS — I10 Essential (primary) hypertension: Secondary | ICD-10-CM | POA: Diagnosis not present

## 2020-10-27 DIAGNOSIS — M800AXD Age-related osteoporosis with current pathological fracture, other site, subsequent encounter for fracture with routine healing: Secondary | ICD-10-CM | POA: Diagnosis not present

## 2020-10-27 DIAGNOSIS — M80012D Age-related osteoporosis with current pathological fracture, left shoulder, subsequent encounter for fracture with routine healing: Secondary | ICD-10-CM | POA: Diagnosis not present

## 2020-10-27 LAB — CMP14+EGFR
ALT: 26 IU/L (ref 0–32)
AST: 33 IU/L (ref 0–40)
Albumin/Globulin Ratio: 1.6 (ref 1.2–2.2)
Albumin: 4 g/dL (ref 3.6–4.6)
Alkaline Phosphatase: 89 IU/L (ref 44–121)
BUN/Creatinine Ratio: 12 (ref 12–28)
BUN: 9 mg/dL (ref 8–27)
Bilirubin Total: 0.4 mg/dL (ref 0.0–1.2)
CO2: 23 mmol/L (ref 20–29)
Calcium: 9.3 mg/dL (ref 8.7–10.3)
Chloride: 102 mmol/L (ref 96–106)
Creatinine, Ser: 0.75 mg/dL (ref 0.57–1.00)
Globulin, Total: 2.5 g/dL (ref 1.5–4.5)
Glucose: 99 mg/dL (ref 65–99)
Potassium: 3.9 mmol/L (ref 3.5–5.2)
Sodium: 140 mmol/L (ref 134–144)
Total Protein: 6.5 g/dL (ref 6.0–8.5)
eGFR: 78 mL/min/{1.73_m2} (ref >59)

## 2020-10-27 LAB — CBC WITH DIFFERENTIAL/PLATELET
Basophils Absolute: 0 10*3/uL (ref 0.0–0.2)
Basos: 0 %
EOS (ABSOLUTE): 0.1 10*3/uL (ref 0.0–0.4)
Eos: 2 %
Hematocrit: 35 % (ref 34.0–46.6)
Hemoglobin: 11.8 g/dL (ref 11.1–15.9)
Immature Grans (Abs): 0 10*3/uL (ref 0.0–0.1)
Immature Granulocytes: 0 %
Lymphocytes Absolute: 1.5 10*3/uL (ref 0.7–3.1)
Lymphs: 23 %
MCH: 32.3 pg (ref 26.6–33.0)
MCHC: 33.7 g/dL (ref 31.5–35.7)
MCV: 96 fL (ref 79–97)
Monocytes Absolute: 0.6 10*3/uL (ref 0.1–0.9)
Monocytes: 10 %
Neutrophils Absolute: 4.1 10*3/uL (ref 1.4–7.0)
Neutrophils: 65 %
Platelets: 228 10*3/uL (ref 150–450)
RBC: 3.65 x10E6/uL — ABNORMAL LOW (ref 3.77–5.28)
RDW: 13.7 % (ref 11.7–15.4)
WBC: 6.4 10*3/uL (ref 3.4–10.8)

## 2020-10-27 LAB — TSH+FREE T4
Free T4: 1.31 ng/dL (ref 0.82–1.77)
TSH: 2.77 u[IU]/mL (ref 0.450–4.500)

## 2020-10-28 DIAGNOSIS — M800AXD Age-related osteoporosis with current pathological fracture, other site, subsequent encounter for fracture with routine healing: Secondary | ICD-10-CM | POA: Diagnosis not present

## 2020-10-28 DIAGNOSIS — I10 Essential (primary) hypertension: Secondary | ICD-10-CM | POA: Diagnosis not present

## 2020-10-28 DIAGNOSIS — M1990 Unspecified osteoarthritis, unspecified site: Secondary | ICD-10-CM | POA: Diagnosis not present

## 2020-10-28 DIAGNOSIS — M80012D Age-related osteoporosis with current pathological fracture, left shoulder, subsequent encounter for fracture with routine healing: Secondary | ICD-10-CM | POA: Diagnosis not present

## 2020-10-28 DIAGNOSIS — Z9181 History of falling: Secondary | ICD-10-CM | POA: Diagnosis not present

## 2020-10-28 DIAGNOSIS — I4891 Unspecified atrial fibrillation: Secondary | ICD-10-CM | POA: Diagnosis not present

## 2020-10-28 LAB — URINE CULTURE

## 2020-10-31 ENCOUNTER — Other Ambulatory Visit: Payer: Self-pay | Admitting: Family Medicine

## 2020-10-31 DIAGNOSIS — Z9181 History of falling: Secondary | ICD-10-CM | POA: Diagnosis not present

## 2020-10-31 DIAGNOSIS — M1990 Unspecified osteoarthritis, unspecified site: Secondary | ICD-10-CM | POA: Diagnosis not present

## 2020-10-31 DIAGNOSIS — I4891 Unspecified atrial fibrillation: Secondary | ICD-10-CM | POA: Diagnosis not present

## 2020-10-31 DIAGNOSIS — M80012D Age-related osteoporosis with current pathological fracture, left shoulder, subsequent encounter for fracture with routine healing: Secondary | ICD-10-CM | POA: Diagnosis not present

## 2020-10-31 DIAGNOSIS — I10 Essential (primary) hypertension: Secondary | ICD-10-CM | POA: Diagnosis not present

## 2020-10-31 DIAGNOSIS — M800AXD Age-related osteoporosis with current pathological fracture, other site, subsequent encounter for fracture with routine healing: Secondary | ICD-10-CM | POA: Diagnosis not present

## 2020-10-31 MED ORDER — AMOXICILLIN 500 MG PO CAPS: 500.0000 mg | ORAL_CAPSULE | Freq: Three times a day (TID) | ORAL | 0 refills | Status: DC

## 2020-11-02 ENCOUNTER — Ambulatory Visit (INDEPENDENT_AMBULATORY_CARE_PROVIDER_SITE_OTHER): Payer: Medicare Other

## 2020-11-02 ENCOUNTER — Other Ambulatory Visit: Payer: Self-pay

## 2020-11-02 ENCOUNTER — Encounter: Payer: Self-pay | Admitting: Family Medicine

## 2020-11-02 ENCOUNTER — Ambulatory Visit (INDEPENDENT_AMBULATORY_CARE_PROVIDER_SITE_OTHER): Payer: Medicare Other | Admitting: Family Medicine

## 2020-11-02 VITALS — BP 151/69 | HR 64 | Temp 97.9°F | Ht 61.0 in | Wt 129.8 lb

## 2020-11-02 DIAGNOSIS — M542 Cervicalgia: Secondary | ICD-10-CM

## 2020-11-02 DIAGNOSIS — R441 Visual hallucinations: Secondary | ICD-10-CM

## 2020-11-02 IMAGING — DX DG CERVICAL SPINE COMPLETE 4+V
5 series · 5 of 5 positions shown · non-contrast
Comparison: CT cervical spine [DATE]. Cervical spine series
[DATE].

CLINICAL DATA: Neck pain.

EXAM:
CERVICAL SPINE - COMPLETE 4+ VIEW

[c-spine lat]
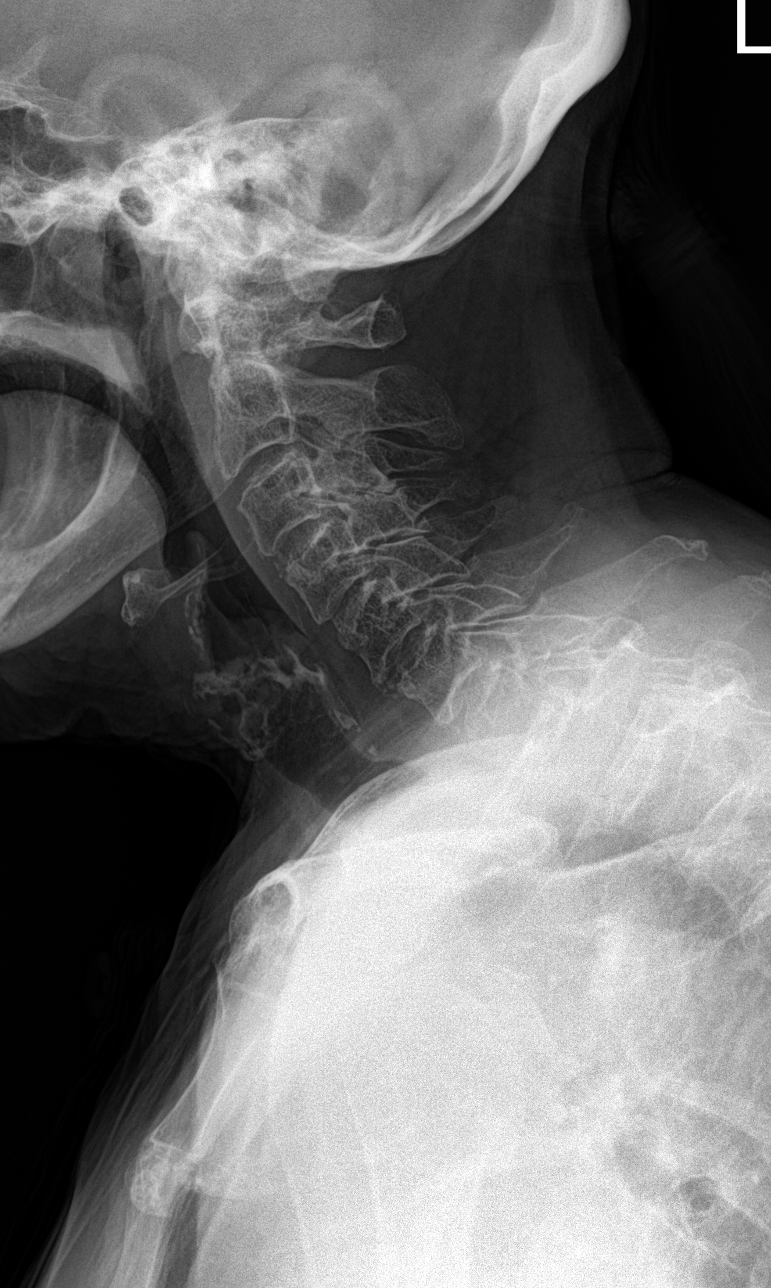

[c-spine obl (1 of 2)]
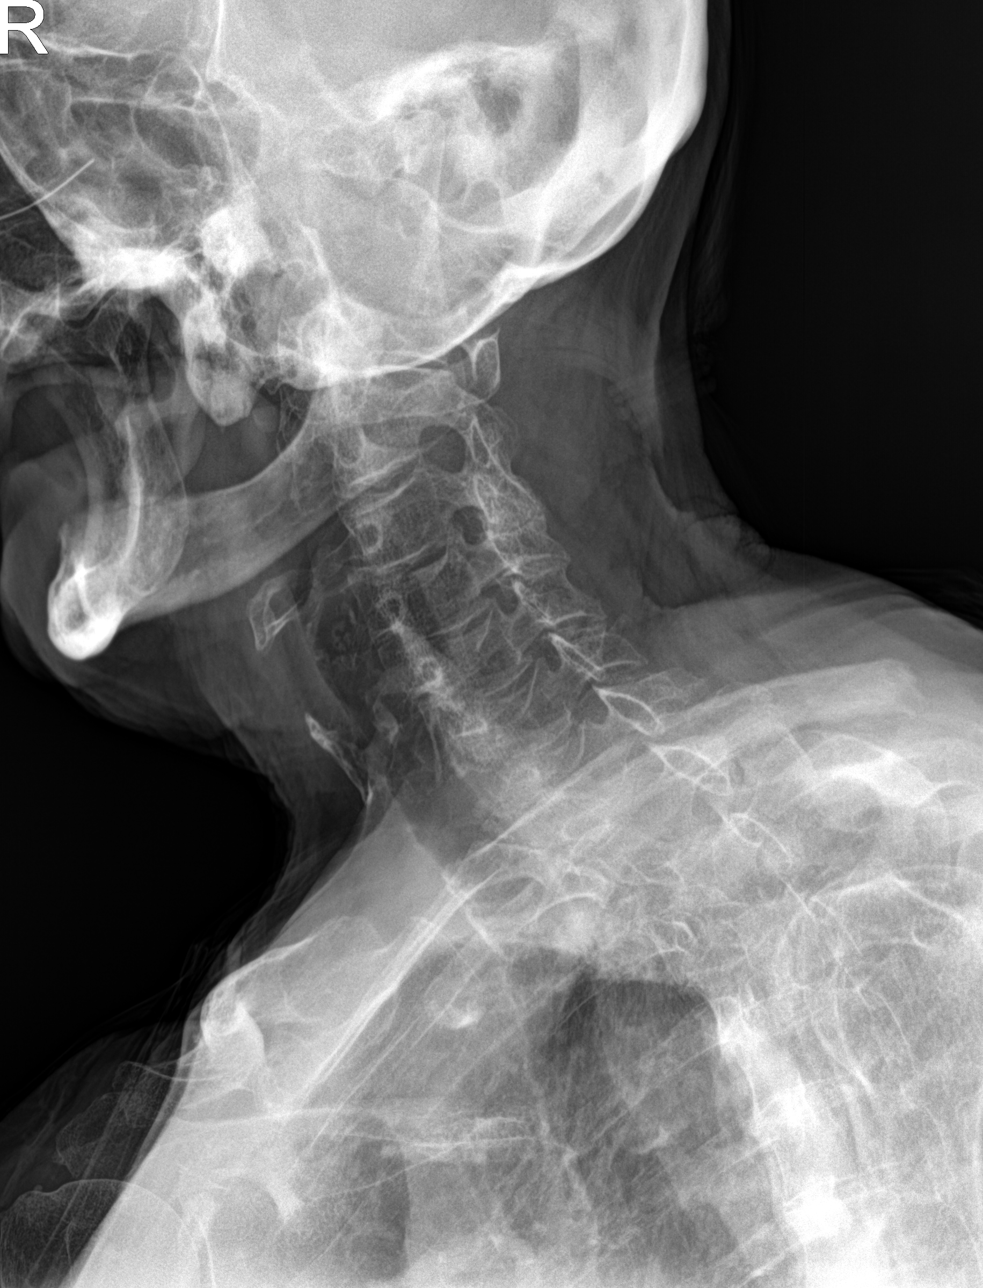

[c-spine obl (2 of 2)]
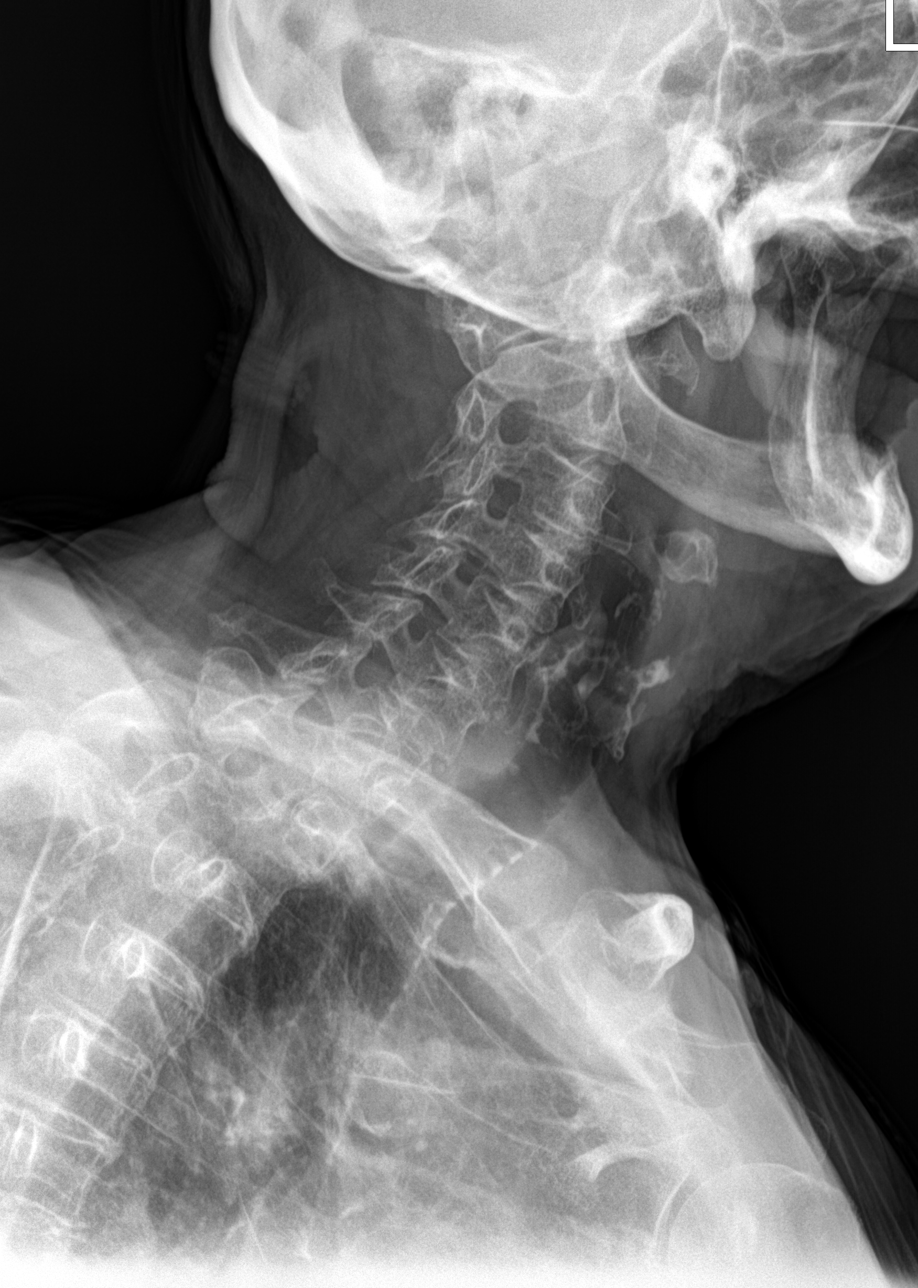

[c-spine ap]
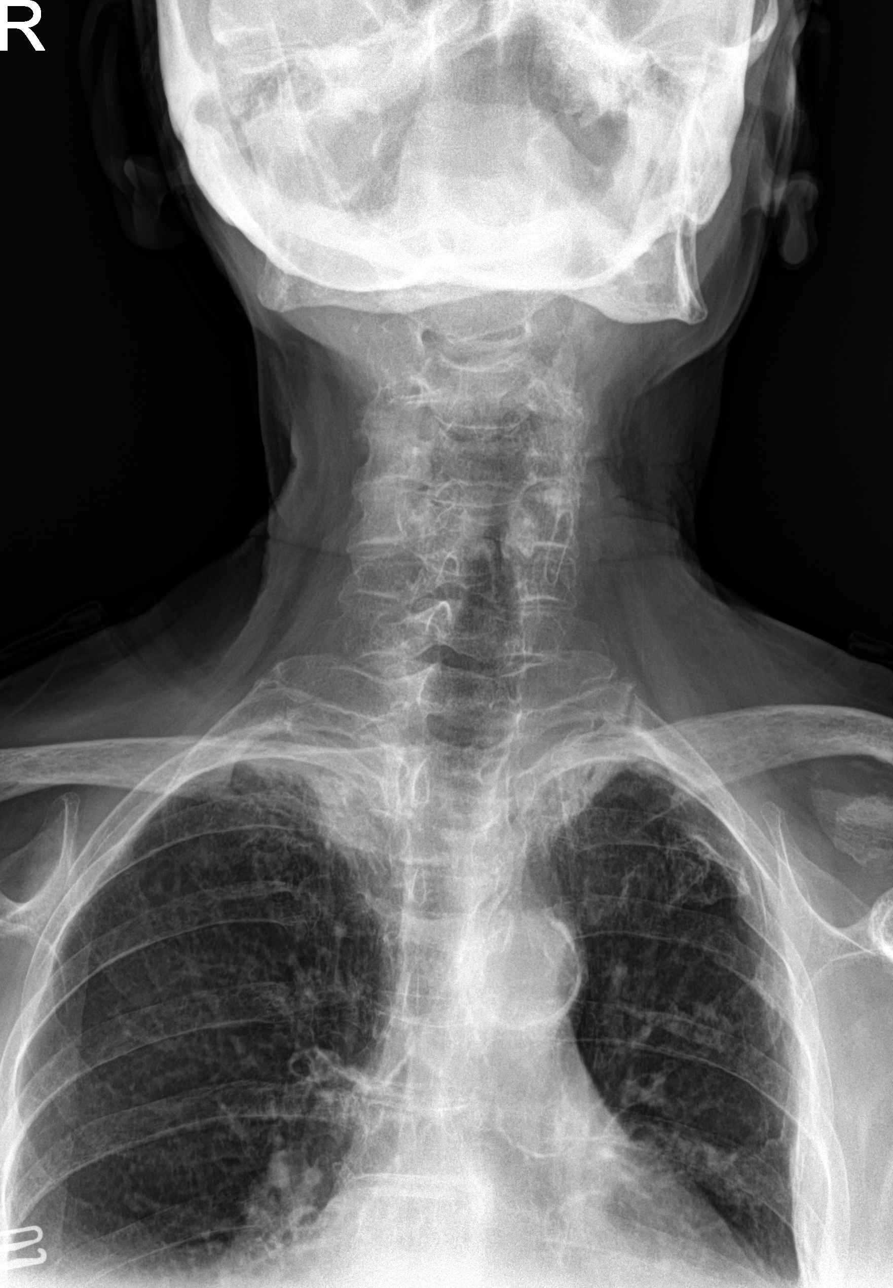

[c-spine open mouth]
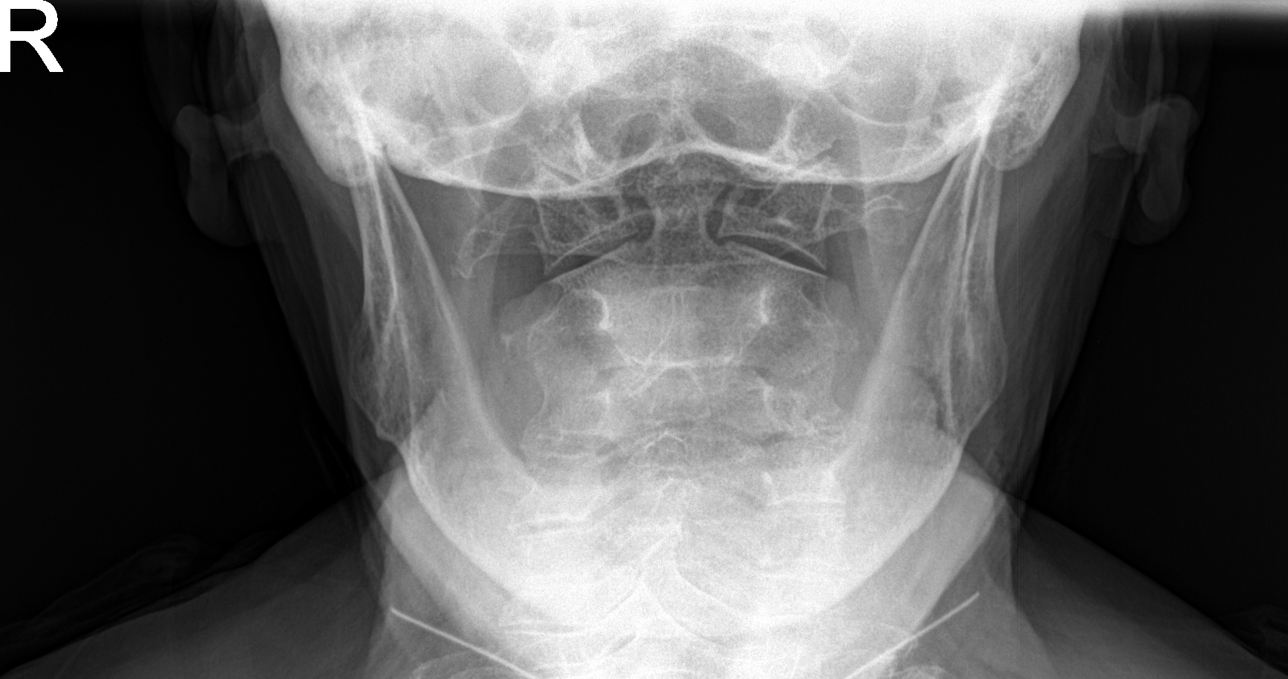

[5 of 5 positions shown; findings below may reference images not displayed]

FINDINGS: Diffuse osteopenia and multilevel degenerative change. No acute bony
abnormality. No evidence of fracture dislocation. Neuroforamen are
patent. Biapical pleural-parenchymal thickening again noted
consistent scarring.
IMPRESSION: 1. Diffuse osteopenia and multilevel degenerative change.
Neuroforamen are patent. No acute bony abnormality.

2. Biapical pleural-parenchymal thickening consistent scarring again
noted.

## 2020-11-02 NOTE — Progress Notes (Signed)
Subjective:  Patient ID: Crystal Browning, female    DOB: [birth year]  Age: 87 y.o. MRN: [id number]  CC: Fall and Neck Pain   HPI Crystal Browning presents for a fall that occurred this morning.  She was trying to sit down on the commode and lost her balance and fell backward hitting the back of her head on the wall.  She also contused her depression.  She had no loss of consciousness.  Her daughter-in-law was able to help her with only minimal injury.  However she is complaining of some neck pain currently.  She has been having some shoulder pain as well recently.  Her daughter-in-law who is with her tells me that she is still having some hallucinations.  She wonders if the new medicine, Seroquel is the source of her being off balance.  She would like to cut the dose in half since Crystal Browning seems to sleep more during the day currently.  This has been noted by her daughter-in-law since starting Seroquel several days ago.  Depression screen Crystal Browning 2/9 11/02/2020 10/26/2020 10/10/2020  Decreased Interest 0 0 0  Down, Depressed, Hopeless 0 0 1  PHQ - 2 Score 0 0 1  Altered sleeping - - 0  Tired, decreased energy - - 0  Change in appetite - - 3  Feeling bad or failure about yourself  - - 1  Trouble concentrating - - 1  Moving slowly or fidgety/restless - - 0  Suicidal thoughts - - 0  PHQ-9 Score - - 6  Difficult doing work/chores - - Not difficult at all  Some recent data might be hidden    History Crystal Browning has a past medical history of Arthritis, Atrial fibrillation ([place]) (10/30/2016), Bilateral lower extremity edema (10/30/2016), Cancer ([place]) (03/28/11), CAP (community acquired pneumonia) (10/04/2019), Depression, Diverticulosis (02/2005), Esophageal stricture (03/03/2018), Generalized abdominal pain (07/17/2015), History of chemotherapy, History of external beam radiation therapy (08/20/2016), Hypertension, Hypothyroid, Osteoporosis, 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   She has a past surgical history that includes Direct laryngoscopy (03/27/2005); Tubal ligation; Total abdominal hysterectomy w/ bilateral salpingoophorectomy (1986); Cataract extraction, bilateral; and Cardioversion (N/A, 11/23/2016).   Her family history includes Alcohol abuse in her brother; COPD in her brother; Cancer (age of onset: 73) in her father; Heart disease (age of onset: 5) in her mother; Stroke in her mother.She reports that she has never smoked. She has never used smokeless tobacco. She reports that she does not drink alcohol and does not use drugs.    ROS Review of Systems  Constitutional: Negative.   HENT: Negative.   Eyes: Negative for visual disturbance.  Respiratory: Negative for shortness of breath.   Cardiovascular: Negative for chest pain.  Gastrointestinal: Negative for abdominal pain.  Musculoskeletal: Positive for neck pain. Negative for arthralgias.  Psychiatric/Behavioral: Positive for confusion and hallucinations.    Objective:  BP (!) 151/69   Pulse 64   Temp 97.9 F (36.6 C)   Ht 5\' 1"  (1.549 m)   Wt 129 lb 12.8 oz (58.9 kg)   SpO2 99%   BMI 24.53 kg/m   BP Readings from Last 3 Encounters:  11/02/20 (!) 151/69  10/26/20 (!) 139/56  10/11/20 (!) 162/84    Wt Readings from Last 3 Encounters:  11/02/20 129 lb 12.8 oz (58.9 kg)  10/26/20 126 lb 6.4 oz (57.3 kg)  10/11/20 124 lb (56.2 kg)     Physical Exam Constitutional:  General: She is not in acute distress.    Appearance: She is well-developed.  Cardiovascular:     Rate and Rhythm: Normal rate and regular rhythm.  Pulmonary:     Breath sounds: Normal breath sounds.  Musculoskeletal:     Cervical back: Normal range of motion. Tenderness (posteriorly. No edema.) present.  Skin:    General: Skin is warm and dry.  Neurological:     Mental Status: She is alert and oriented to person, place, and time.     Cervical spine x-ray shows some degenerative  disc disease and spondylosis.  No acute injury. Assessment & Plan:   Crystal Browning was seen today for fall and neck pain.  Diagnoses and all orders for this visit:  Neck pain -     DG Cervical Spine Complete; Future       I am having Crystal Browning maintain her acetaminophen, metoprolol succinate, famotidine, levothyroxine, escitalopram, Eliquis, loratadine, solifenacin, fesoterodine, QUEtiapine, and amoxicillin.  Allergies as of 11/02/2020      Reactions   Amlodipine Besylate Hives   Hives and confusion.   Tuberculin Tests Swelling   Claritin [loratadine] Rash   Pt's daughter states she is allergic to claritin   Paroxetine Hcl Rash   Vit D-vit E-safflower Oil       Medication List       Accurate as of November 02, 2020 11:27 AM. If you have any questions, ask your nurse or doctor.        acetaminophen 325 MG tablet Commonly known as: TYLENOL Take 2 tablets (650 mg total) by mouth every 6 (six) hours as needed for mild pain, fever or headache.   amoxicillin 500 MG capsule Commonly known as: AMOXIL Take 1 capsule (500 mg total) by mouth 3 (three) times daily.   Eliquis 5 MG Tabs tablet Generic drug: apixaban TAKE 1 TABLET BY MOUTH TWICE A DAY   escitalopram 10 MG tablet Commonly known as: LEXAPRO Take 1 tablet (10 mg total) by mouth daily.   famotidine 20 MG tablet Commonly known as: PEPCID Take 1 tablet (20 mg total) by mouth every 12 (twelve) hours.   fesoterodine 4 MG Tb24 tablet Commonly known as: Toviaz Take 1 tablet (4 mg total) by mouth daily.   levothyroxine 50 MCG tablet Commonly known as: SYNTHROID Take 1 tablet (50 mcg total) by mouth daily.   loratadine 10 MG tablet Commonly known as: CLARITIN Take 10 mg by mouth daily.   metoprolol succinate 100 MG 24 hr tablet Commonly known as: TOPROL-XL Take with or immediately following a meal.   QUEtiapine 25 MG tablet Commonly known as: SEROQUEL Take 1 tablet (25 mg total) by mouth at bedtime.    solifenacin 5 MG tablet Commonly known as: VESICARE Take 1 tablet (5 mg total) by mouth daily.        Follow-up: No follow-ups on file.  Crystal Browning, M.D.

## 2020-11-03 ENCOUNTER — Other Ambulatory Visit: Payer: Self-pay | Admitting: Family Medicine

## 2020-11-03 DIAGNOSIS — M80012D Age-related osteoporosis with current pathological fracture, left shoulder, subsequent encounter for fracture with routine healing: Secondary | ICD-10-CM | POA: Diagnosis not present

## 2020-11-03 DIAGNOSIS — Z9181 History of falling: Secondary | ICD-10-CM | POA: Diagnosis not present

## 2020-11-03 DIAGNOSIS — I4891 Unspecified atrial fibrillation: Secondary | ICD-10-CM | POA: Diagnosis not present

## 2020-11-03 DIAGNOSIS — M800AXD Age-related osteoporosis with current pathological fracture, other site, subsequent encounter for fracture with routine healing: Secondary | ICD-10-CM | POA: Diagnosis not present

## 2020-11-03 DIAGNOSIS — I10 Essential (primary) hypertension: Secondary | ICD-10-CM | POA: Diagnosis not present

## 2020-11-03 DIAGNOSIS — F419 Anxiety disorder, unspecified: Secondary | ICD-10-CM

## 2020-11-03 DIAGNOSIS — M1990 Unspecified osteoarthritis, unspecified site: Secondary | ICD-10-CM | POA: Diagnosis not present

## 2020-11-07 DIAGNOSIS — Z9181 History of falling: Secondary | ICD-10-CM | POA: Diagnosis not present

## 2020-11-07 DIAGNOSIS — M80012D Age-related osteoporosis with current pathological fracture, left shoulder, subsequent encounter for fracture with routine healing: Secondary | ICD-10-CM | POA: Diagnosis not present

## 2020-11-07 DIAGNOSIS — I10 Essential (primary) hypertension: Secondary | ICD-10-CM | POA: Diagnosis not present

## 2020-11-07 DIAGNOSIS — M1990 Unspecified osteoarthritis, unspecified site: Secondary | ICD-10-CM | POA: Diagnosis not present

## 2020-11-07 DIAGNOSIS — I4891 Unspecified atrial fibrillation: Secondary | ICD-10-CM | POA: Diagnosis not present

## 2020-11-07 DIAGNOSIS — M800AXD Age-related osteoporosis with current pathological fracture, other site, subsequent encounter for fracture with routine healing: Secondary | ICD-10-CM | POA: Diagnosis not present

## 2020-11-08 ENCOUNTER — Ambulatory Visit: Payer: Medicare Other | Admitting: Family Medicine

## 2020-11-09 ENCOUNTER — Encounter: Payer: Self-pay | Admitting: Family Medicine

## 2020-11-09 DIAGNOSIS — I4891 Unspecified atrial fibrillation: Secondary | ICD-10-CM | POA: Diagnosis not present

## 2020-11-09 DIAGNOSIS — Z9181 History of falling: Secondary | ICD-10-CM | POA: Diagnosis not present

## 2020-11-09 DIAGNOSIS — M1990 Unspecified osteoarthritis, unspecified site: Secondary | ICD-10-CM | POA: Diagnosis not present

## 2020-11-09 DIAGNOSIS — M800AXD Age-related osteoporosis with current pathological fracture, other site, subsequent encounter for fracture with routine healing: Secondary | ICD-10-CM | POA: Diagnosis not present

## 2020-11-09 DIAGNOSIS — I10 Essential (primary) hypertension: Secondary | ICD-10-CM | POA: Diagnosis not present

## 2020-11-09 DIAGNOSIS — M80012D Age-related osteoporosis with current pathological fracture, left shoulder, subsequent encounter for fracture with routine healing: Secondary | ICD-10-CM | POA: Diagnosis not present

## 2020-11-14 DIAGNOSIS — Z9181 History of falling: Secondary | ICD-10-CM | POA: Diagnosis not present

## 2020-11-14 DIAGNOSIS — M800AXD Age-related osteoporosis with current pathological fracture, other site, subsequent encounter for fracture with routine healing: Secondary | ICD-10-CM | POA: Diagnosis not present

## 2020-11-14 DIAGNOSIS — M1990 Unspecified osteoarthritis, unspecified site: Secondary | ICD-10-CM | POA: Diagnosis not present

## 2020-11-14 DIAGNOSIS — I10 Essential (primary) hypertension: Secondary | ICD-10-CM | POA: Diagnosis not present

## 2020-11-14 DIAGNOSIS — M80012D Age-related osteoporosis with current pathological fracture, left shoulder, subsequent encounter for fracture with routine healing: Secondary | ICD-10-CM | POA: Diagnosis not present

## 2020-11-14 DIAGNOSIS — I4891 Unspecified atrial fibrillation: Secondary | ICD-10-CM | POA: Diagnosis not present

## 2020-11-16 DIAGNOSIS — Z9181 History of falling: Secondary | ICD-10-CM | POA: Diagnosis not present

## 2020-11-16 DIAGNOSIS — M800AXD Age-related osteoporosis with current pathological fracture, other site, subsequent encounter for fracture with routine healing: Secondary | ICD-10-CM | POA: Diagnosis not present

## 2020-11-16 DIAGNOSIS — M1990 Unspecified osteoarthritis, unspecified site: Secondary | ICD-10-CM | POA: Diagnosis not present

## 2020-11-16 DIAGNOSIS — M80012D Age-related osteoporosis with current pathological fracture, left shoulder, subsequent encounter for fracture with routine healing: Secondary | ICD-10-CM | POA: Diagnosis not present

## 2020-11-16 DIAGNOSIS — I4891 Unspecified atrial fibrillation: Secondary | ICD-10-CM | POA: Diagnosis not present

## 2020-11-16 DIAGNOSIS — I10 Essential (primary) hypertension: Secondary | ICD-10-CM | POA: Diagnosis not present

## 2020-11-17 ENCOUNTER — Ambulatory Visit (INDEPENDENT_AMBULATORY_CARE_PROVIDER_SITE_OTHER): Payer: Medicare Other

## 2020-11-17 ENCOUNTER — Other Ambulatory Visit: Payer: Self-pay

## 2020-11-17 DIAGNOSIS — I10 Essential (primary) hypertension: Secondary | ICD-10-CM | POA: Diagnosis not present

## 2020-11-17 DIAGNOSIS — Z9181 History of falling: Secondary | ICD-10-CM | POA: Diagnosis not present

## 2020-11-17 DIAGNOSIS — Z7901 Long term (current) use of anticoagulants: Secondary | ICD-10-CM | POA: Diagnosis not present

## 2020-11-17 DIAGNOSIS — M80012D Age-related osteoporosis with current pathological fracture, left shoulder, subsequent encounter for fracture with routine healing: Secondary | ICD-10-CM

## 2020-11-17 DIAGNOSIS — M800AXD Age-related osteoporosis with current pathological fracture, other site, subsequent encounter for fracture with routine healing: Secondary | ICD-10-CM

## 2020-11-17 DIAGNOSIS — I4891 Unspecified atrial fibrillation: Secondary | ICD-10-CM | POA: Diagnosis not present

## 2020-11-17 DIAGNOSIS — M1990 Unspecified osteoarthritis, unspecified site: Secondary | ICD-10-CM | POA: Diagnosis not present

## 2020-11-21 DIAGNOSIS — I4891 Unspecified atrial fibrillation: Secondary | ICD-10-CM | POA: Diagnosis not present

## 2020-11-21 DIAGNOSIS — M1990 Unspecified osteoarthritis, unspecified site: Secondary | ICD-10-CM | POA: Diagnosis not present

## 2020-11-21 DIAGNOSIS — Z9181 History of falling: Secondary | ICD-10-CM | POA: Diagnosis not present

## 2020-11-21 DIAGNOSIS — M800AXD Age-related osteoporosis with current pathological fracture, other site, subsequent encounter for fracture with routine healing: Secondary | ICD-10-CM | POA: Diagnosis not present

## 2020-11-21 DIAGNOSIS — M80012D Age-related osteoporosis with current pathological fracture, left shoulder, subsequent encounter for fracture with routine healing: Secondary | ICD-10-CM | POA: Diagnosis not present

## 2020-11-21 DIAGNOSIS — I10 Essential (primary) hypertension: Secondary | ICD-10-CM | POA: Diagnosis not present

## 2020-11-28 ENCOUNTER — Ambulatory Visit: Payer: Medicare Other | Admitting: Family Medicine

## 2020-11-28 ENCOUNTER — Other Ambulatory Visit: Payer: Self-pay | Admitting: Family Medicine

## 2020-11-28 DIAGNOSIS — F419 Anxiety disorder, unspecified: Secondary | ICD-10-CM

## 2020-12-03 ENCOUNTER — Other Ambulatory Visit: Payer: Self-pay | Admitting: Family Medicine

## 2020-12-03 DIAGNOSIS — I4891 Unspecified atrial fibrillation: Secondary | ICD-10-CM

## 2020-12-05 ENCOUNTER — Other Ambulatory Visit: Payer: Self-pay | Admitting: Family Medicine

## 2020-12-05 DIAGNOSIS — I10 Essential (primary) hypertension: Secondary | ICD-10-CM

## 2020-12-05 DIAGNOSIS — I4891 Unspecified atrial fibrillation: Secondary | ICD-10-CM

## 2020-12-07 ENCOUNTER — Ambulatory Visit (INDEPENDENT_AMBULATORY_CARE_PROVIDER_SITE_OTHER): Payer: Medicare Other | Admitting: Nurse Practitioner

## 2020-12-07 ENCOUNTER — Emergency Department (HOSPITAL_COMMUNITY)
Admission: EM | Admit: 2020-12-07 | Discharge: 2020-12-08 | Disposition: A | Discharge status: Home / Self Care | Payer: Medicare Other | Attending: Emergency Medicine | Admitting: Emergency Medicine

## 2020-12-07 ENCOUNTER — Other Ambulatory Visit: Payer: Self-pay

## 2020-12-07 ENCOUNTER — Emergency Department (HOSPITAL_COMMUNITY): Payer: Medicare Other

## 2020-12-07 ENCOUNTER — Encounter (HOSPITAL_COMMUNITY): Payer: Self-pay

## 2020-12-07 ENCOUNTER — Encounter: Payer: Self-pay | Admitting: Nurse Practitioner

## 2020-12-07 VITALS — BP 137/76 | HR 56 | Temp 97.5°F | Ht 61.0 in | Wt 133.0 lb

## 2020-12-07 DIAGNOSIS — Z85828 Personal history of other malignant neoplasm of skin: Secondary | ICD-10-CM | POA: Insufficient documentation

## 2020-12-07 DIAGNOSIS — Z8521 Personal history of malignant neoplasm of larynx: Secondary | ICD-10-CM | POA: Diagnosis not present

## 2020-12-07 DIAGNOSIS — R443 Hallucinations, unspecified: Secondary | ICD-10-CM | POA: Diagnosis not present

## 2020-12-07 DIAGNOSIS — Z79899 Other long term (current) drug therapy: Secondary | ICD-10-CM | POA: Insufficient documentation

## 2020-12-07 DIAGNOSIS — I10 Essential (primary) hypertension: Secondary | ICD-10-CM | POA: Diagnosis not present

## 2020-12-07 DIAGNOSIS — Z7901 Long term (current) use of anticoagulants: Secondary | ICD-10-CM | POA: Diagnosis not present

## 2020-12-07 DIAGNOSIS — R4182 Altered mental status, unspecified: Secondary | ICD-10-CM | POA: Diagnosis not present

## 2020-12-07 DIAGNOSIS — E039 Hypothyroidism, unspecified: Secondary | ICD-10-CM | POA: Insufficient documentation

## 2020-12-07 DIAGNOSIS — N3000 Acute cystitis without hematuria: Secondary | ICD-10-CM | POA: Diagnosis not present

## 2020-12-07 LAB — URINALYSIS, ROUTINE W REFLEX MICROSCOPIC
Bilirubin Urine: NEGATIVE
Glucose, UA: NEGATIVE mg/dL
Hgb urine dipstick: NEGATIVE
Ketones, ur: NEGATIVE mg/dL
Nitrite: NEGATIVE
Protein, ur: NEGATIVE mg/dL
Specific Gravity, Urine: 1.012 (ref 1.005–1.030)
pH: 6 (ref 5.0–8.0)

## 2020-12-07 LAB — CBC
HCT: 37.2 % (ref 36.0–46.0)
Hemoglobin: 11.9 g/dL — ABNORMAL LOW (ref 12.0–15.0)
MCH: 32.2 pg (ref 26.0–34.0)
MCHC: 32 g/dL (ref 30.0–36.0)
MCV: 100.5 fL — ABNORMAL HIGH (ref 80.0–100.0)
Platelets: 194 10*3/uL (ref 150–400)
RBC: 3.7 MIL/uL — ABNORMAL LOW (ref 3.87–5.11)
RDW: 13.4 % (ref 11.5–15.5)
WBC: 4.9 10*3/uL (ref 4.0–10.5)
nRBC: 0 % (ref 0.0–0.2)

## 2020-12-07 LAB — BASIC METABOLIC PANEL
Anion gap: 6 (ref 5–15)
BUN: 14 mg/dL (ref 8–23)
CO2: 26 mmol/L (ref 22–32)
Calcium: 9 mg/dL (ref 8.9–10.3)
Chloride: 105 mmol/L (ref 98–111)
Creatinine, Ser: 0.85 mg/dL (ref 0.44–1.00)
GFR, Estimated: >60 mL/min (ref >60)
Glucose, Bld: 108 mg/dL — ABNORMAL HIGH (ref 70–99)
Potassium: 3.9 mmol/L (ref 3.5–5.1)
Sodium: 137 mmol/L (ref 135–145)

## 2020-12-07 LAB — TSH: TSH: 8.707 u[IU]/mL — ABNORMAL HIGH (ref 0.350–4.500)

## 2020-12-07 IMAGING — CT CT HEAD W/O CM
3 series · 15 of 47 positions shown, 18 images · non-contrast
Comparison: Head CT dated [DATE].

CLINICAL DATA: 87-year-old female with altered mental status.

EXAM:
CT HEAD WITHOUT CONTRAST
TECHNIQUE: Contiguous axial images were obtained from the base of the skull
through the vertex without intravenous contrast.

[Series 2: head w o · axial · 0.42mm/px · z∈[+46,+181]mm · 9 of 33 slices shown, 12 images]
[im 3/33  brain]
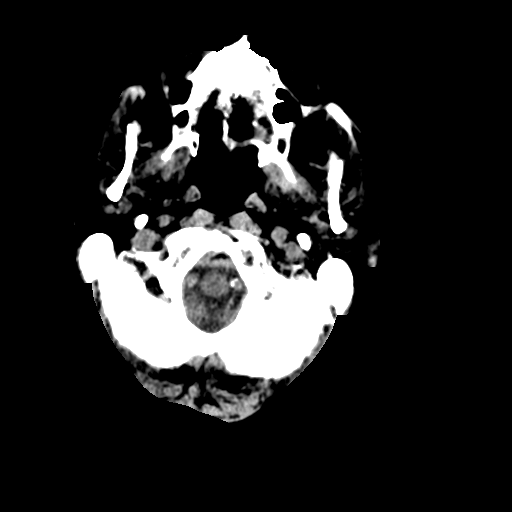
[im 3/33  bone]
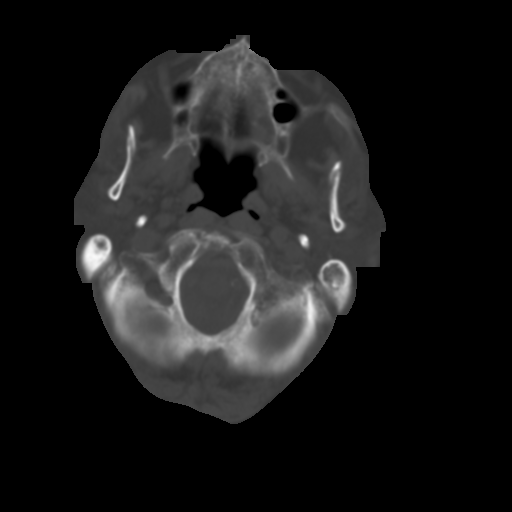
[im 6/33  brain]
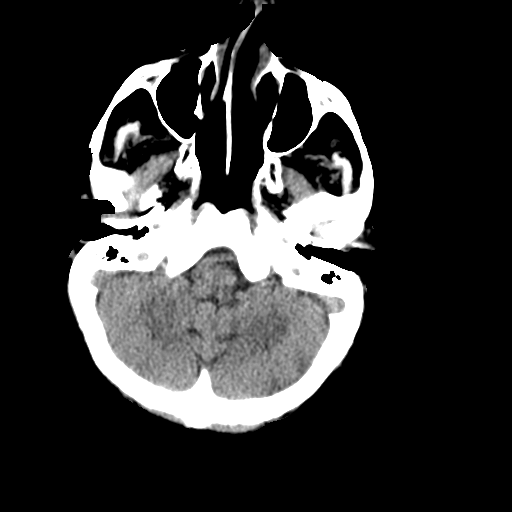
[im 9/33  brain]
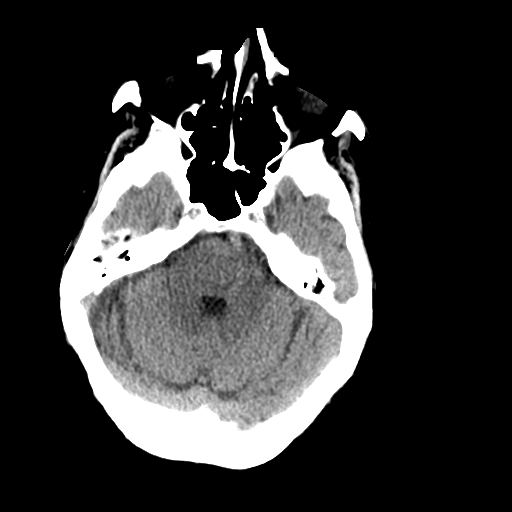
[im 13/33  brain]
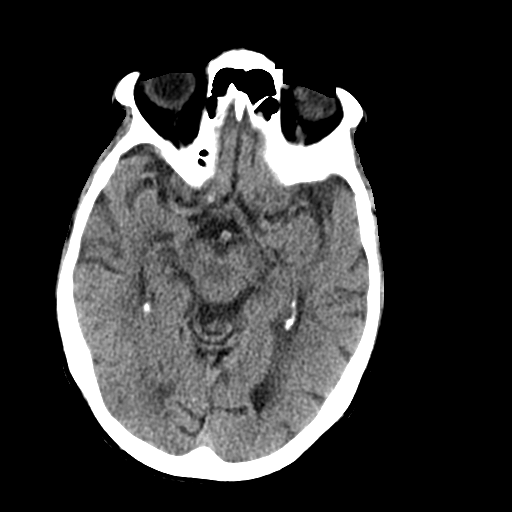
[im 17/33  brain]
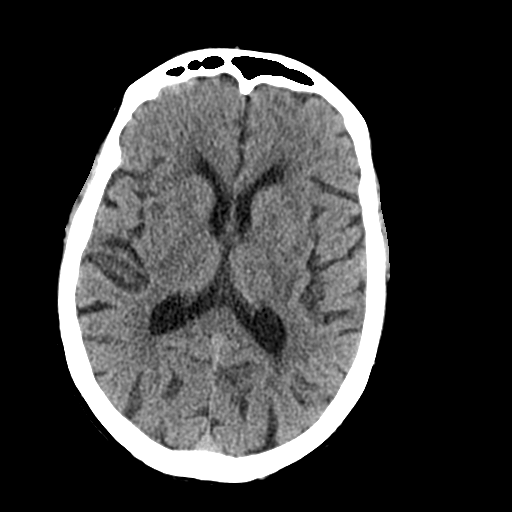
[im 17/33  bone]
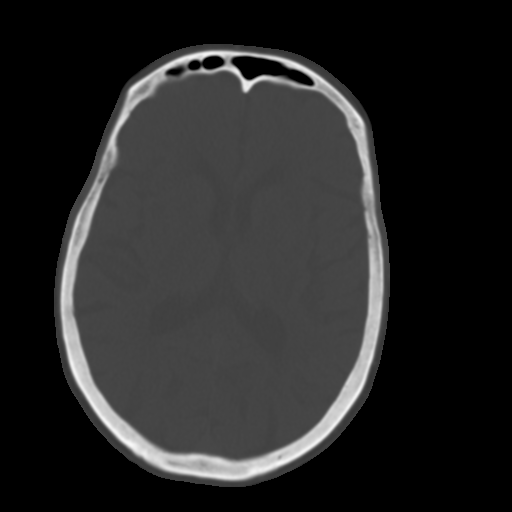
[im 20/33  brain]
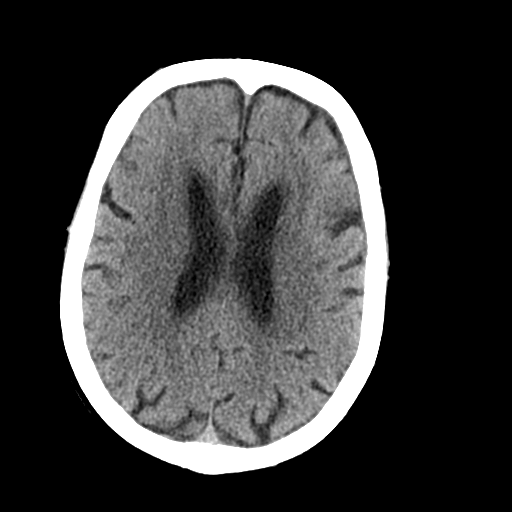
[im 24/33  brain]
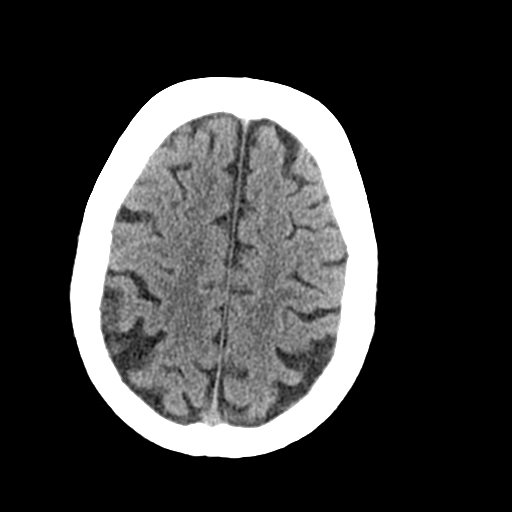
[im 27/33  brain]
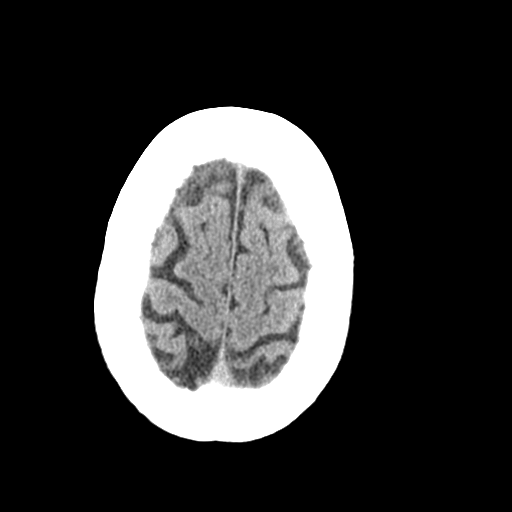
[im 30/33  brain]
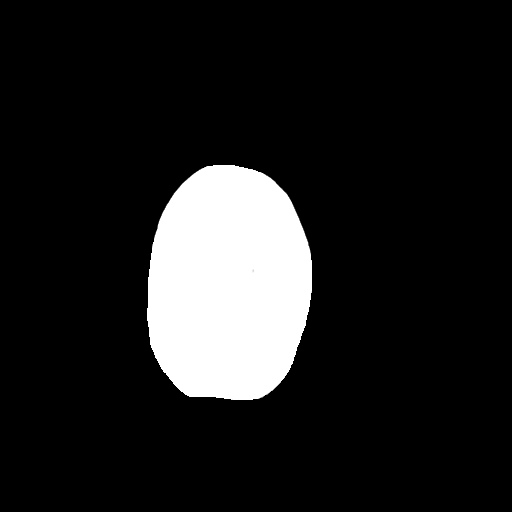
[im 30/33  bone]
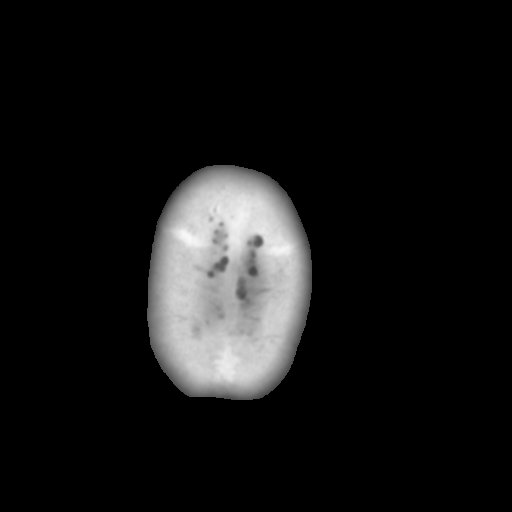

[Series 4: coronal soft · coronal · 0.31mm/px · 3 of 66 slices shown]
[im 22/66  brain]
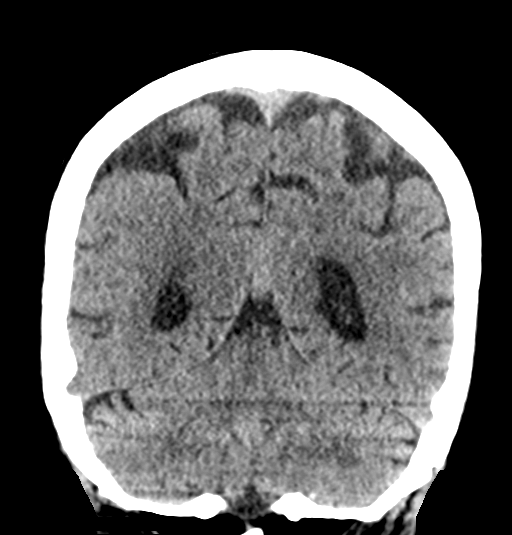
[im 29/66  brain]
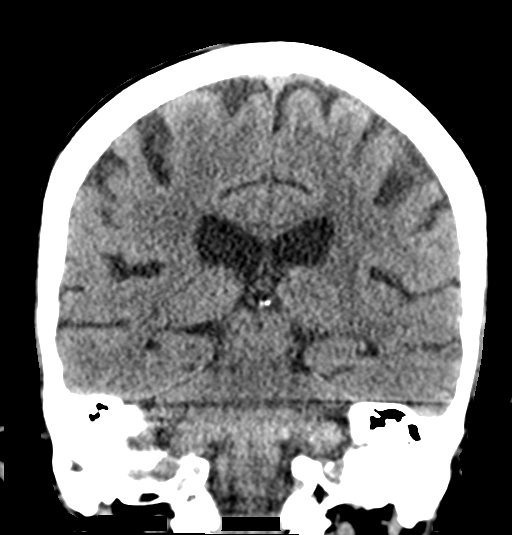
[im 37/66  brain]
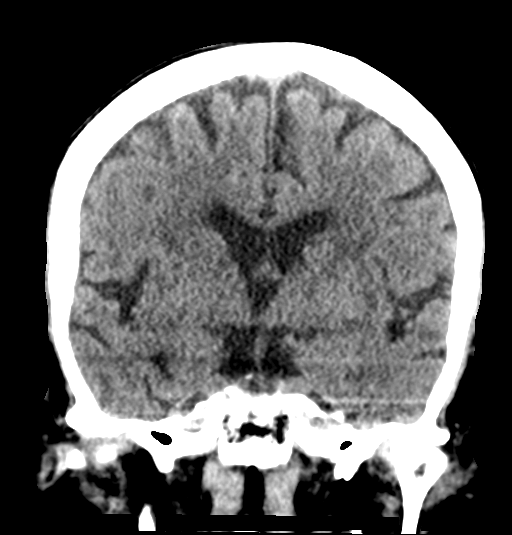

[Series 5: sagittal soft · sagittal · 0.33mm/px · 3 of 49 slices shown]
[im 17/49  brain]
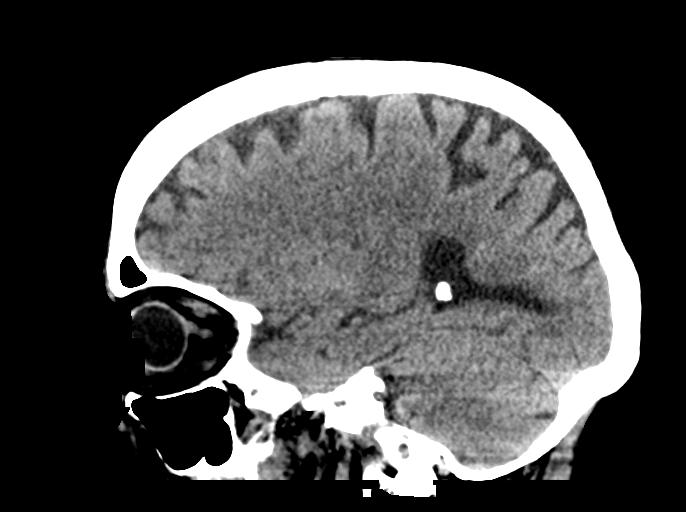
[im 25/49  brain]
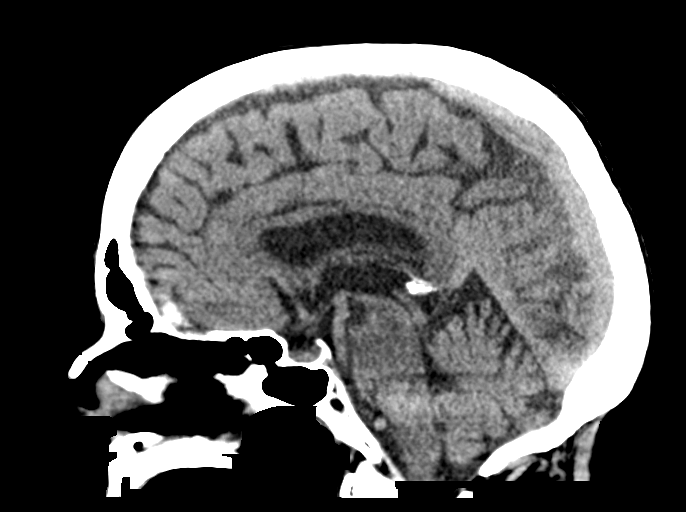
[im 33/49  brain]
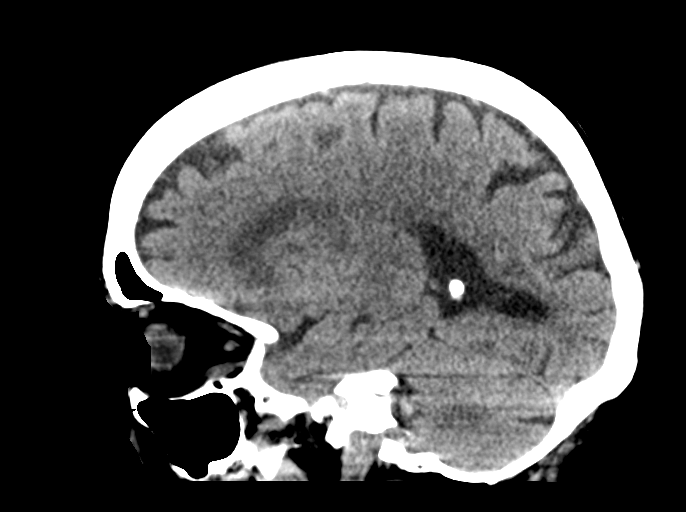

[15 of 47 positions shown; findings below may reference images not displayed]

FINDINGS: Brain: Mild age-related atrophy and chronic microvascular ischemic
changes. There is no acute intracranial hemorrhage. No mass effect
midline shift no extra-axial fluid collection.

Vascular: No hyperdense vessel or unexpected calcification.

Skull: Normal. Negative for fracture or focal lesion.

Sinuses/Orbits: The visualized paranasal sinuses are clear. There is
partial opacification of the right mastoid air cells. The left
mastoid air cells are clear.

Other: None
IMPRESSION: 1. No acute intracranial pathology.
2. Mild age-related atrophy and chronic microvascular ischemic
changes.

## 2020-12-07 MED ORDER — CEPHALEXIN 500 MG PO CAPS
500.0000 mg | ORAL_CAPSULE | Freq: Once | ORAL | Status: AC
  Administered 2020-12-07: 500 mg via ORAL
  Filled 2020-12-07: qty 1

## 2020-12-07 MED ORDER — CEPHALEXIN 250 MG PO CAPS: 250.0000 mg | ORAL_CAPSULE | Freq: Three times a day (TID) | ORAL | 0 refills | Status: AC

## 2020-12-07 NOTE — Patient Instructions (Signed)
Psychosis Psychosis, also called thought disturbance, refers to a severe loss of contact with reality. People having a psychotic episode are not able to think clearly, and their emotions and responses do not match with what is actually happening. People having a psychotic episode may have false beliefs about what is happening or who they are (delusions). They may see, hear, taste, smell, or feel things that are not present (hallucinations). They may also be very upset (agitated), have chaotic behavior, or be very quiet and withdrawn. What are the causes? This condition may be caused by:  Very serious mental health (psychiatric) conditions such as schizophrenia, bipolar disorder, or major depression.  Use of drugs such as hallucinogens or alcohol.  Medical conditions such as delirium or neurological disorders. What are the signs or symptoms? Symptoms of this condition include:  Delusions, such as: ? Feeling a lot of fear or suspicion (paranoia). ? Believing something that is odd, unrealistic, or false, such as believing that you are someone else.  Hallucinations, such as: ? Hearing or seeing things, smelling odors, experiencing tastes, or feeling bodily sensations. ? Command hallucinations that direct you to do something that could be dangerous.  Disorganized thinking, such as thoughts that jump from one idea to another in a way that does not make sense.  Disorganized speech, such as saying things that do not make sense, echoing others, or using words based on their sound rather than their meaning.  Inappropriate behavior, such as talking to yourself, showing a clear increase or decrease in activity, or intruding on unfamiliar people. How is this diagnosed? This condition is diagnosed based on an assessment by a health care provider.  The health care provider may ask questions about: ? Your thoughts, feelings, and behavior. ? Any medical conditions you have. ? Any use of alcohol or  drugs.  One or more of the following may also be done: ? A physical exam. ? Blood tests. ? Brain imaging, such as a CT scan or MRI. ? A brain wave study (electroencephalogram, or EEG). The health care provider may refer you to a mental health professional for further tests.   How is this treated? Treatment for this condition may depend on the cause of the psychosis. Treatment may include one or more of the following:  Supportive care and monitoring in the emergency room or hospital. You may need to stay in the hospital if you are a danger to yourself or others.  Taking antipsychotic medicines to reduce symptoms and to balance chemicals in the brain.  Treating an underlying medical condition.  Stopping or reducing drugs that are causing psychosis.  Therapy and other supportive programs, such as: ? Ongoing treatment and care from a mental health professional. ? Individual or family therapy. ? Training to learn new skills to cope with the psychosis and prevent further episodes.   Follow these instructions at home:  Take over-the-counter and prescription medicines only as told by your health care provider.  Consult a health care provider before taking over-the-counter medicines, herbs, or supplements.  Surround yourself with people who care about you and can help manage your condition.  Keep stress under control. Stress may trigger psychosis and make symptoms worse.  Maintain a healthy lifestyle. This includes: ? Eating a healthy diet. ? Getting enough sleep. ? Exercising regularly. ? Avoiding alcohol, nicotine, and recreational drugs.  Keep all follow-up visits as told by your health care provider. This is important. Contact a health care provider if:  Medicines do not  seem to be helping.  You or others notice that you: ? Continue to see, smell, or feel things that are not there. ? Hear voices telling you to do things. ? Feel extremely fearful and suspicious that someone  or something will harm you. ? Feel unable to leave your house. ? Have trouble taking care of yourself.  You have side effects of medicines, such as: ? Changes in sleep patterns. ? Dizziness. ? Weight gain. ? Restlessness. ? Movement changes. ? Shaking that you cannot control (tremors). Get help right away if:  You have serious side effects of medicine, such as: ? Swelling of the face, lips, tongue, or throat. ? Fever, confusion, muscle spasms, or seizures.  You have serious thoughts about harming yourself or hurting others. If you ever feel like you may hurt yourself or others, or have thoughts about taking your own life, get help right away. You can go to your nearest emergency department or call:  Your local emergency services (911 in the [place]  A suicide crisis helpline, such as the [place] at (801)864-5139. This is open 24 hours a day. Summary  Psychosis refers to a severe loss of contact with reality. People having a psychotic episode are not able to think clearly, and they may have delusions or hallucinations.  Psychosis is a serious medical condition that should be treated by a medical professional as soon as possible. Being checked and treated right away can stop or reduce symptoms. This prevents more serious problems from developing.  In some cases, treatment may include taking antipsychotic medicines to reduce symptoms and to balance chemicals in the brain.  Support programs may help you learn new skills to cope with the psychosis and prevent further episodes. This information is not intended to replace advice given to you by your health care provider. Make sure you discuss any questions you have with your health care provider. Document Revised: 09/13/2017 Document Reviewed: 08/13/2017 Elsevier Patient Education  2021 [employer].

## 2020-12-07 NOTE — ED Notes (Signed)
Pt in bed, family at bedside, pt oriented to person, place and day of the week, pt slow to answer questions, states that she is more confused, pt to bathroom with family for urine sample.

## 2020-12-07 NOTE — Discharge Instructions (Addendum)
Take the antibiotics as prescribed.  Make sure to follow-up with your primary care doctor to check on the blood pressure and make sure the infection has cleared.  Return as needed for worsening symptoms

## 2020-12-07 NOTE — Progress Notes (Signed)
Acute Office Visit  Subjective:    Patient ID: Crystal Browning, female    DOB: 29-Oct-1933, 87 y.o.   MRN: [id number]  Chief Complaint  Patient presents with  . Hallucinations    HPI Patient is in today for worsening hallucinations.  Patient was seen for same symptoms prior weeks and started on Seroquel 25 mg tablet by mouth at bedtime.  Family is reporting that hallucination is a little better but now patient is experiencing irritability, and morning time hallucination is worse.  Past Medical History:  Diagnosis Date  . Arthritis   . Atrial fibrillation ([place]) 10/30/2016  . Bilateral lower extremity edema 10/30/2016  . Cancer ([place]) 03/28/11   SCCA of the Supraglottic Larynx (T2NoMo)    ; S/P Chemoradiation therapy from 05/09/05 thru 06/29/05  . CAP (community acquired pneumonia) 10/04/2019  . Depression    History of Depression- Lexapro therapy  . Diverticulosis 02/2005   History of diverticulosis with admission from 8/18 - 03/04/2005  . Esophageal stricture 03/03/2018  . Generalized abdominal pain 07/17/2015  . History of chemotherapy    S/P chemotherapy with Dr. Sonny Dandy -2006  . History of external beam radiation therapy 08/20/2016  . Hypertension   . Hypothyroid   . Osteoporosis    History of Osteoporosis with one year of Actonel only  . 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 Radiation    S/P radiation thrapy -06/2005  . TIA (transient ischemic attack)    History of TIA's with no residual effect    Past Surgical History:  Procedure Laterality Date  . CARDIOVERSION N/A 11/23/2016   Procedure: CARDIOVERSION;  Surgeon: Pixie Casino, MD;  Location: [facility] ENDOSCOPY;  Service: Cardiovascular;  Laterality: N/A;  . CATARACT EXTRACTION, BILATERAL    . DIRECT LARYNGOSCOPY  03/27/2005   S/P direct laryngoscopy, esophagoscopy and biopsy with Dr. Constance Holster  . TOTAL ABDOMINAL HYSTERECTOMY W/ BILATERAL SALPINGOOPHORECTOMY  1986  . TUBAL LIGATION      Family History  Problem  Relation Age of Onset  . Stroke Mother   . Heart disease Mother 71  . Cancer Father 25       Prostate  . Alcohol abuse Brother   . COPD Brother     Social History   Socioeconomic History  . Marital status: Widowed    Spouse name: Not on file  . Number of children: 6  . Years of education: Not on file  . Highest education level: Not on file  Occupational History  . Not on file  Tobacco Use  . Smoking status: Never Smoker  . Smokeless tobacco: Never Used  Vaping Use  . Vaping Use: Never used  Substance and Sexual Activity  . Alcohol use: No  . Drug use: No  . Sexual activity: Not on file  Other Topics Concern  . Not on file  Social History Narrative  . Not on file   Social Determinants of Health   Financial Resource Strain: Not on file  Food Insecurity: Not on file  Transportation Needs: Not on file  Physical Activity: Not on file  Stress: Not on file  Social Connections: Not on file  Intimate Partner Violence: Not on file    Outpatient Medications Prior to Visit  Medication Sig Dispense Refill  . acetaminophen (TYLENOL) 325 MG tablet Take 2 tablets (650 mg total) by mouth every 6 (six) hours as needed for mild pain, fever or headache. 12 tablet 0  . ELIQUIS 5 MG TABS tablet  TAKE 1 TABLET BY MOUTH TWICE A DAY 60 tablet 0  . escitalopram (LEXAPRO) 10 MG tablet TAKE 1 TABLET BY MOUTH EVERY DAY 90 tablet 0  . famotidine (PEPCID) 20 MG tablet Take 1 tablet (20 mg total) by mouth every 12 (twelve) hours. 180 tablet 1  . fesoterodine (TOVIAZ) 4 MG TB24 tablet Take 1 tablet (4 mg total) by mouth daily. 30 tablet 2  . levothyroxine (SYNTHROID) 50 MCG tablet Take 1 tablet (50 mcg total) by mouth daily. 90 tablet 1  . loratadine (CLARITIN) 10 MG tablet Take 10 mg by mouth daily.    . metoprolol succinate (TOPROL-XL) 100 MG 24 hr tablet TAKE 1 TABLET DAILY WITH OR IMMEDIATELY FOLLOWING A MEAL. 90 tablet 0  . QUEtiapine (SEROQUEL) 25 MG tablet Take 1 tablet (25 mg total) by  mouth at bedtime. (Patient taking differently: Take 12.5 mg by mouth at bedtime.) 90 tablet 1  . solifenacin (VESICARE) 5 MG tablet Take 1 tablet (5 mg total) by mouth daily. (Patient not taking: Reported on 12/07/2020) 90 tablet 3  . amoxicillin (AMOXIL) 500 MG capsule Take 1 capsule (500 mg total) by mouth 3 (three) times daily. 21 capsule 0   No facility-administered medications prior to visit.    Allergies  Allergen Reactions  . Amlodipine Besylate Hives    Hives and confusion.  . Tuberculin Tests Swelling  . Claritin [Loratadine] Rash    Pt's daughter states she is allergic to claritin  . Paroxetine Hcl Rash  . Vit D-Vit E-Safflower Oil     Review of Systems  Constitutional: Negative.   HENT: Negative.   Respiratory: Negative.   Cardiovascular: Negative.   Genitourinary: Negative.   Skin: Negative for rash.  Psychiatric/Behavioral: Positive for hallucinations.  All other systems reviewed and are negative.      Objective:    Physical Exam Vitals reviewed.  Constitutional:      Appearance: Normal appearance.  HENT:     Head: Normocephalic.     Nose: Nose normal.  Eyes:     Conjunctiva/sclera: Conjunctivae normal.  Cardiovascular:     Rate and Rhythm: Normal rate and regular rhythm.     Pulses: Normal pulses.     Heart sounds: Normal heart sounds.  Pulmonary:     Effort: Pulmonary effort is normal.     Breath sounds: Normal breath sounds.  Abdominal:     General: Bowel sounds are normal.  Neurological:     Mental Status: She is alert.  Psychiatric:        Attention and Perception: Attention normal.        Behavior: Behavior is cooperative.    MMSE - Mini Mental State Exam 12/07/2020 06/29/2020  Orientation to time 5 4  Orientation to Place 2 4  Registration 3 3  Attention/ Calculation 0 2  Recall 1 3  Language- name 2 objects 2 2  Language- repeat 1 0  Language- follow 3 step command 2 3  Language- read & follow direction 1 1  Write a sentence 0 0   Copy design 0 0  Total score 17 22    BP 137/76   Pulse (!) 56   Temp (!) 97.5 F (36.4 C) (Temporal)   Ht 5' 1" (1.549 m)   Wt 133 lb (60.3 kg)   SpO2 95%   BMI 25.13 kg/m  Wt Readings from Last 3 Encounters:  12/07/20 133 lb (60.3 kg)  12/07/20 133 lb (60.3 kg)  11/02/20 129 lb 12.8 oz (  58.9 kg)    Health Maintenance Due  Topic Date Due  . COVID-19 Vaccine (1) Never done  . Zoster Vaccines- Shingrix (1 of 2) Never done    There are no preventive care reminders to display for this patient.   Lab Results  Component Value Date   TSH 8.707 (H) 12/07/2020   Lab Results  Component Value Date   WBC 4.9 12/07/2020   HGB 11.9 (L) 12/07/2020   HCT 37.2 12/07/2020   MCV 100.5 (H) 12/07/2020   PLT 194 12/07/2020   Lab Results  Component Value Date   NA 137 12/07/2020   K 3.9 12/07/2020   CO2 26 12/07/2020   GLUCOSE 108 (H) 12/07/2020   BUN 14 12/07/2020   CREATININE 0.85 12/07/2020   BILITOT 0.4 10/26/2020   ALKPHOS 89 10/26/2020   AST 33 10/26/2020   ALT 26 10/26/2020   PROT 6.5 10/26/2020   ALBUMIN 4.0 10/26/2020   CALCIUM 9.0 12/07/2020   ANIONGAP 6 12/07/2020   EGFR 78 10/26/2020   Lab Results  Component Value Date   CHOL 166 03/30/2020   Lab Results  Component Value Date   HDL 54 03/30/2020   Lab Results  Component Value Date   LDLCALC 89 03/30/2020   Lab Results  Component Value Date   TRIG 131 03/30/2020   Lab Results  Component Value Date   CHOLHDL 3.1 03/30/2020   No results found for: HGBA1C     Assessment & Plan:   Problem List Items Addressed This Visit      Other   Hallucination - Primary    Worsening hallucination.  Completed MMSE.  Values hours from 6 months ago.  Results in chart.  Discussed results with patient and caregiver.  Patient will schedule an appointment with PCP for continued evaluation or dementia. Advised patient to continue 25 mg Seroquel at bedtime to give medication time to work. Avoid excessive daytime  sleep to help with better sleep at night.  Avoid caffeinated drinks prior to bedtime.  Follow-up with worsening or unresolved           No orders of the defined types were placed in this encounter.    Ivy Lynn, NP

## 2020-12-07 NOTE — ED Triage Notes (Signed)
Daughter in law states that Crystal Browning has been hearing voices and thinking people are under her trailer/house.  Patient has experienced same symptoms with a UTI in the past.

## 2020-12-07 NOTE — ED Provider Notes (Signed)
[facility] EMERGENCY DEPARTMENT Provider Note   CSN: [id number] Arrival date & time: 12/07/20  1943     History Chief Complaint  Patient presents with  . Hallucinations    Crystal Browning is a 87 y.o. female.  HPI   Patient presents to the ED for evaluation of worsening hallucinations.  Patient was seen by her primary care doctor for similar symptoms last month.  She is taking Seroquel.  The daughter has noticed over the last several days she has had increasing hallucinations.  She thinks she hears a baby crying.  She thought that someone was possibly under the house.  Patient has had similar symptoms when she had urinary tract infection so family brought her in for evaluation.  No recent falls or injuries.  No fevers or chills.  No vomiting or diarrhea.  No new changes in the medications in the last couple of weeks  Past Medical History:  Diagnosis Date  . Arthritis   . Atrial fibrillation ([place]) 10/30/2016  . Bilateral lower extremity edema 10/30/2016  . Cancer ([place]) 03/28/11   SCCA of the Supraglottic Larynx (T2NoMo)    ; S/P Chemoradiation therapy from 05/09/05 thru 06/29/05  . CAP (community acquired pneumonia) 10/04/2019  . Depression    History of Depression- Lexapro therapy  . Diverticulosis 02/2005   History of diverticulosis with admission from 8/18 - 03/04/2005  . Esophageal stricture 03/03/2018  . Generalized abdominal pain 07/17/2015  . History of chemotherapy    S/P chemotherapy with Dr. Sonny Dandy -2006  . History of external beam radiation therapy 08/20/2016  . Hypertension   . Hypothyroid   . Osteoporosis    History of Osteoporosis with one year of Actonel only  . 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 Radiation    S/P radiation thrapy -06/2005  . TIA (transient ischemic attack)    History of TIA's with no residual effect    Patient Active Problem List   Diagnosis Date Noted  . Pharyngoesophageal dysphagia 01/14/2018  . Long term (current) use of  anticoagulants 12/14/2016  . Atrial fibrillation ([place]) 10/30/2016  . Other fatigue 10/30/2016  . Venous insufficiency of both lower extremities 10/30/2016  . History of laryngeal cancer 08/20/2016  . Xerostomia 08/20/2016  . Postmenopausal 07/17/2015  . Thyroid activity decreased 07/17/2015  . Essential hypertension 07/12/2015  . Angina pectoris ([place]) 07/12/2015  . Vitamin D deficiency 07/12/2015    Past Surgical History:  Procedure Laterality Date  . CARDIOVERSION N/A 11/23/2016   Procedure: CARDIOVERSION;  Surgeon: Pixie Casino, MD;  Location: [facility] ENDOSCOPY;  Service: Cardiovascular;  Laterality: N/A;  . CATARACT EXTRACTION, BILATERAL    . DIRECT LARYNGOSCOPY  03/27/2005   S/P direct laryngoscopy, esophagoscopy and biopsy with Dr. Constance Holster  . TOTAL ABDOMINAL HYSTERECTOMY W/ BILATERAL SALPINGOOPHORECTOMY  1986  . TUBAL LIGATION       OB History   No obstetric history on file.     Family History  Problem Relation Age of Onset  . Stroke Mother   . Heart disease Mother 68  . Cancer Father 104       Prostate  . Alcohol abuse Brother   . COPD Brother     Social History   Tobacco Use  . Smoking status: Never Smoker  . Smokeless tobacco: Never Used  Vaping Use  . Vaping Use: Never used  Substance Use Topics  . Alcohol use: No  . Drug use: No    Home Medications Prior to  Admission medications   Medication Sig Start Date End Date Taking? Authorizing Provider  acetaminophen (TYLENOL) 325 MG tablet Take 2 tablets (650 mg total) by mouth every 6 (six) hours as needed for mild pain, fever or headache. 10/07/19  Yes Emokpae, Courage, MD  cephALEXin (KEFLEX) 250 MG capsule Take 1 capsule (250 mg total) by mouth 3 (three) times daily for 7 days. 12/07/20 12/14/20 Yes Dorie Rank, MD  ELIQUIS 5 MG TABS tablet TAKE 1 TABLET BY MOUTH TWICE A DAY 12/05/20  Yes [name], MD  escitalopram (LEXAPRO) 10 MG tablet TAKE 1 TABLET BY MOUTH EVERY DAY 11/28/20  Yes [name], MD   famotidine (PEPCID) 20 MG tablet Take 1 tablet (20 mg total) by mouth every 12 (twelve) hours. 03/30/20  Yes [name], MD  fesoterodine (TOVIAZ) 4 MG TB24 tablet Take 1 tablet (4 mg total) by mouth daily. 10/12/20  Yes [name], MD  levothyroxine (SYNTHROID) 50 MCG tablet Take 1 tablet (50 mcg total) by mouth daily. 07/11/20  Yes Stacks, Cletus Gash, MD  loratadine (CLARITIN) 10 MG tablet Take 10 mg by mouth daily.   Yes [provider]  metoprolol succinate (TOPROL-XL) 100 MG 24 hr tablet TAKE 1 TABLET DAILY WITH OR IMMEDIATELY FOLLOWING A MEAL. 12/05/20  Yes [name], MD  QUEtiapine (SEROQUEL) 25 MG tablet Take 1 tablet (25 mg total) by mouth at bedtime. Patient taking differently: Take 12.5 mg by mouth at bedtime. 10/26/20  Yes Stacks, Cletus Gash, MD  solifenacin (VESICARE) 5 MG tablet Take 1 tablet (5 mg total) by mouth daily. Patient not taking: Reported on 12/07/2020 10/10/20 10/10/21  [name], MD    Allergies    Amlodipine besylate, Tuberculin tests, Claritin [loratadine], Paroxetine hcl, and Vit d-vit e-safflower oil  Review of Systems   Review of Systems  All other systems reviewed and are negative.   Physical Exam Updated Vital Signs BP (!) 176/71   Pulse 61   Temp 98.4 F (36.9 C) (Oral)   Resp 17   Ht 1.549 m (5\' 1" )   Wt 60.3 kg   SpO2 98%   BMI 25.13 kg/m   Physical Exam Vitals and nursing note reviewed.  Constitutional:      General: She is not in acute distress.    Appearance: She is well-developed.  HENT:     Head: Normocephalic and atraumatic.     Right Ear: External ear normal.     Left Ear: External ear normal.  Eyes:     General: No scleral icterus.       Right eye: No discharge.        Left eye: No discharge.     Conjunctiva/sclera: Conjunctivae normal.  Neck:     Trachea: No tracheal deviation.  Cardiovascular:     Rate and Rhythm: Normal rate and regular rhythm.  Pulmonary:     Effort: Pulmonary effort is normal. No  respiratory distress.     Breath sounds: Normal breath sounds. No stridor. No wheezing or rales.  Abdominal:     General: Bowel sounds are normal. There is no distension.     Palpations: Abdomen is soft.     Tenderness: There is no abdominal tenderness. There is no guarding or rebound.  Musculoskeletal:        General: No tenderness.     Cervical back: Neck supple.  Skin:    General: Skin is warm and dry.     Findings: No rash.  Neurological:     Mental Status:  She is alert.     Cranial Nerves: No cranial nerve deficit (no facial droop, extraocular movements intact, no slurred speech).     Sensory: No sensory deficit.     Motor: No abnormal muscle tone or seizure activity.     Coordination: Coordination normal.     ED Results / Procedures / Treatments   Labs (all labs ordered are listed, but only abnormal results are displayed) Labs Reviewed  URINALYSIS, ROUTINE W REFLEX MICROSCOPIC - Abnormal; Notable for the following components:      Result Value   Leukocytes,Ua SMALL (*)    Bacteria, UA RARE (*)    All other components within normal limits  CBC - Abnormal; Notable for the following components:   RBC 3.70 (*)    Hemoglobin 11.9 (*)    MCV 100.5 (*)    All other components within normal limits  BASIC METABOLIC PANEL - Abnormal; Notable for the following components:   Glucose, Bld 108 (*)    All other components within normal limits  TSH - Abnormal; Notable for the following components:   TSH 8.707 (*)    All other components within normal limits  URINE CULTURE    EKG None  Radiology CT Head Wo Contrast  Result Date: 12/07/2020 CLINICAL DATA:  87 year old female with altered mental status. EXAM: CT HEAD WITHOUT CONTRAST TECHNIQUE: Contiguous axial images were obtained from the base of the skull through the vertex without intravenous contrast. COMPARISON:  Head CT dated 08/07/2020. FINDINGS: Brain: Mild age-related atrophy and chronic microvascular ischemic changes.  There is no acute intracranial hemorrhage. No mass effect midline shift no extra-axial fluid collection. Vascular: No hyperdense vessel or unexpected calcification. Skull: Normal. Negative for fracture or focal lesion. Sinuses/Orbits: The visualized paranasal sinuses are clear. There is partial opacification of the right mastoid air cells. The left mastoid air cells are clear. Other: None IMPRESSION: 1. No acute intracranial pathology. 2. Mild age-related atrophy and chronic microvascular ischemic changes. Electronically Signed   By: Anner Crete M.D.   On: 12/07/2020 22:44    Procedures Procedures   Medications Ordered in ED Medications  cephALEXin (KEFLEX) capsule 500 mg (500 mg Oral Given 12/07/20 2339)    ED Course  I have reviewed the triage vital signs and the nursing notes.  Pertinent labs & imaging results that were available during my care of the patient were reviewed by me and considered in my medical decision making (see chart for details).  Clinical Course as of 12/07/20 2344  Wed Dec 07, 2020  2328 Urinalysis does show 21-50 Fines blood cells rare bacteria.  Metabolic panel laboratory tests otherwise unremarkable [JK]  2328 CT scan without acute injury [JK]    Clinical Course User Index [JK] Dorie Rank, MD   MDM Rules/Calculators/A&P                          Patient presented to the ED for evaluation of hallucinations.  Medical records indicate patient has been having some issues with this over at least the left past month.  She is taking Seroquel.  Patient is nontoxic and well-appearing in the ED.  ED work-up does show signs of urinary tract infection.  CBC and metabolic panel otherwise unremarkable.  No signs of sepsis.  CT scan does not show any acute abnormalities.  May have some underlying developing dementia but she does appear to have a urinary tract infection.  Will DC home on antibiotics. Final Clinical  Impression(s) / ED Diagnoses Final diagnoses:  Acute  cystitis without hematuria  Hypertension, unspecified type  Hallucination    Rx / DC Orders ED Discharge Orders         Ordered    cephALEXin (KEFLEX) 250 MG capsule  3 times daily        12/07/20 2344           Dorie Rank, MD 12/07/20 [phone]

## 2020-12-09 ENCOUNTER — Telehealth (INDEPENDENT_AMBULATORY_CARE_PROVIDER_SITE_OTHER): Payer: Medicare Other

## 2020-12-09 DIAGNOSIS — R443 Hallucinations, unspecified: Secondary | ICD-10-CM | POA: Insufficient documentation

## 2020-12-09 LAB — URINE CULTURE

## 2020-12-09 NOTE — Assessment & Plan Note (Signed)
Worsening hallucination.  Completed MMSE.  Values hours from 6 months ago.  Results in chart.  Discussed results with patient and caregiver.  Patient will schedule an appointment with PCP for continued evaluation or dementia. Advised patient to continue 25 mg Seroquel at bedtime to give medication time to work. Avoid excessive daytime sleep to help with better sleep at night.  Avoid caffeinated drinks prior to bedtime.  Follow-up with worsening or unresolved

## 2020-12-09 NOTE — Telephone Encounter (Signed)
Erroneous encounter

## 2020-12-09 NOTE — Telephone Encounter (Deleted)
Transition Care Management Unsuccessful Follow-up Telephone Call  Date of discharge and from where:  Crystal Browning  Diagnosis: ***   Attempts:  {TOC Attempted Calls:24207}  Reason for unsuccessful TCM follow-up call:  {REASON FOR UNSUCCESSFUL TOC CALL:24206}

## 2020-12-14 ENCOUNTER — Telehealth: Payer: Self-pay | Admitting: Family Medicine

## 2020-12-14 NOTE — Telephone Encounter (Signed)
Quetiapine can calm the hallucinations. Try taking two at bedtime. Will likely cause some drowsiness some mornings

## 2020-12-22 ENCOUNTER — Other Ambulatory Visit: Payer: Self-pay

## 2020-12-22 ENCOUNTER — Ambulatory Visit (INDEPENDENT_AMBULATORY_CARE_PROVIDER_SITE_OTHER): Payer: Medicare Other | Admitting: Family Medicine

## 2020-12-22 ENCOUNTER — Encounter: Payer: Self-pay | Admitting: Family Medicine

## 2020-12-22 DIAGNOSIS — R441 Visual hallucinations: Secondary | ICD-10-CM | POA: Diagnosis not present

## 2020-12-22 MED ORDER — QUETIAPINE FUMARATE 25 MG PO TABS: 25.0000 mg | ORAL_TABLET | Freq: Every day | ORAL | 1 refills | Status: DC

## 2020-12-22 NOTE — Progress Notes (Signed)
Subjective:  Patient ID: Crystal Browning, female    DOB: 06/17/1934  Age: 87 y.o. MRN: [id number]  CC: Medical Management of Chronic Issues   HPI [name] presents for hallucinations. Auditory, some threatening. Daughters say there is no variation in med. Pt. Has been taking everything as prescribed. She has a delusion that a family is living under her house.   Depression screen [facility] 2/9 12/22/2020 11/02/2020 10/26/2020  Decreased Interest 3 0 0  Down, Depressed, Hopeless 1 0 0  PHQ - 2 Score 4 0 0  Altered sleeping 0 - -  Tired, decreased energy 1 - -  Change in appetite 3 - -  Feeling bad or failure about yourself  1 - -  Trouble concentrating 1 - -  Moving slowly or fidgety/restless 0 - -  Suicidal thoughts 0 - -  PHQ-9 Score 10 - -  Difficult doing work/chores Not difficult at all - -  Some recent data might be hidden      History [name] has a past medical history of Arthritis, Atrial fibrillation ([place]) (10/30/2016), Bilateral lower extremity edema (10/30/2016), Cancer ([place]) (03/28/11), CAP (community acquired pneumonia) (10/04/2019), Depression, Diverticulosis (02/2005), Esophageal stricture (03/03/2018), Generalized abdominal pain (07/17/2015), History of chemotherapy, History of external beam radiation therapy (08/20/2016), Hypertension, Hypothyroid, Osteoporosis, 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   She has a past surgical history that includes Direct laryngoscopy (03/27/2005); Tubal ligation; Total abdominal hysterectomy w/ bilateral salpingoophorectomy (1986); Cataract extraction, bilateral; and Cardioversion (N/A, 11/23/2016).   Her family history includes Alcohol abuse in her brother; COPD in her brother; Cancer (age of onset: 8) in her father; Heart disease (age of onset: 52) in her mother; Stroke in her mother.She reports that she has never smoked. She has never used smokeless tobacco. She reports that she does  not drink alcohol and does not use drugs.    ROS Review of Systems  Constitutional: Negative.   HENT: Negative.    Eyes:  Negative for visual disturbance.  Respiratory:  Negative for shortness of breath.   Cardiovascular:  Negative for chest pain.  Gastrointestinal:  Negative for abdominal pain.  Musculoskeletal:  Negative for arthralgias.   Objective:  BP (!) 167/71   Pulse 64   Temp 97.8 F (36.6 C)   Ht 5\' 1"  (1.549 m)   Wt 132 lb 3.2 oz (60 kg)   SpO2 97%   BMI 24.98 kg/m   BP Readings from Last 3 Encounters:  12/22/20 (!) 167/71  12/07/20 (!) 176/71  12/07/20 137/76    Wt Readings from Last 3 Encounters:  12/22/20 132 lb 3.2 oz (60 kg)  12/07/20 133 lb (60.3 kg)  12/07/20 133 lb (60.3 kg)     Physical Exam Constitutional:      General: She is not in acute distress.    Appearance: She is well-developed.  HENT:     Head: Normocephalic and atraumatic.  Eyes:     Conjunctiva/sclera: Conjunctivae normal.     Pupils: Pupils are equal, round, and reactive to light.  Neck:     Thyroid: No thyromegaly.  Cardiovascular:     Rate and Rhythm: Normal rate and regular rhythm.     Heart sounds: Normal heart sounds. No murmur heard. Pulmonary:     Effort: Pulmonary effort is normal. No respiratory distress.     Breath sounds: Normal breath sounds. No wheezing or rales.  Abdominal:     General: Bowel sounds  are normal. There is no distension.     Palpations: Abdomen is soft.     Tenderness: There is no abdominal tenderness.  Musculoskeletal:        General: Normal range of motion.     Cervical back: Normal range of motion and neck supple.  Lymphadenopathy:     Cervical: No cervical adenopathy.  Skin:    General: Skin is warm and dry.  Neurological:     Mental Status: She is alert and oriented to person, place, and time.  Psychiatric:        Behavior: Behavior normal.        Thought Content: Thought content normal.        Judgment: Judgment normal.       Assessment & Plan:   Crystal Browning was seen today for medical management of chronic issues.  Diagnoses and all orders for this visit:  Hallucinations, visual -     QUEtiapine (SEROQUEL) 25 MG tablet; Take 1 tablet (25 mg total) by mouth at bedtime.      I am having Crystal Browning maintain her acetaminophen, famotidine, levothyroxine, loratadine, solifenacin, fesoterodine, escitalopram, Eliquis, metoprolol succinate, and QUEtiapine.  Allergies as of 12/22/2020       Reactions   Amlodipine Besylate Hives   Hives and confusion.   Tuberculin Tests Swelling   Claritin [loratadine] Rash   Pt's daughter states she is allergic to claritin   Paroxetine Hcl Rash   Vit D-vit E-safflower Oil         Medication List        Accurate as of December 22, 2020 11:59 PM. If you have any questions, ask your nurse or doctor.          acetaminophen 325 MG tablet Commonly known as: TYLENOL Take 2 tablets (650 mg total) by mouth every 6 (six) hours as needed for mild pain, fever or headache.   Eliquis 5 MG Tabs tablet Generic drug: apixaban TAKE 1 TABLET BY MOUTH TWICE A DAY   escitalopram 10 MG tablet Commonly known as: LEXAPRO TAKE 1 TABLET BY MOUTH EVERY DAY   famotidine 20 MG tablet Commonly known as: PEPCID Take 1 tablet (20 mg total) by mouth every 12 (twelve) hours.   fesoterodine 4 MG Tb24 tablet Commonly known as: Toviaz Take 1 tablet (4 mg total) by mouth daily.   levothyroxine 50 MCG tablet Commonly known as: SYNTHROID Take 1 tablet (50 mcg total) by mouth daily.   loratadine 10 MG tablet Commonly known as: CLARITIN Take 10 mg by mouth daily.   metoprolol succinate 100 MG 24 hr tablet Commonly known as: TOPROL-XL TAKE 1 TABLET DAILY WITH OR IMMEDIATELY FOLLOWING A MEAL.   QUEtiapine 25 MG tablet Commonly known as: SEROQUEL Take 1 tablet (25 mg total) by mouth at bedtime. What changed: how much to take   solifenacin 5 MG tablet Commonly known as:  VESICARE Take 1 tablet (5 mg total) by mouth daily.         Follow-up: No follow-ups on file.  Crystal Browning, M.D.

## 2020-12-24 ENCOUNTER — Other Ambulatory Visit: Payer: Self-pay | Admitting: Family Medicine

## 2020-12-24 DIAGNOSIS — I4891 Unspecified atrial fibrillation: Secondary | ICD-10-CM

## 2020-12-25 ENCOUNTER — Encounter: Payer: Self-pay | Admitting: Family Medicine

## 2021-01-02 ENCOUNTER — Other Ambulatory Visit: Payer: Self-pay | Admitting: Family Medicine

## 2021-01-02 ENCOUNTER — Telehealth: Payer: Self-pay | Admitting: Family Medicine

## 2021-01-02 DIAGNOSIS — E039 Hypothyroidism, unspecified: Secondary | ICD-10-CM

## 2021-01-02 NOTE — Telephone Encounter (Signed)
Son not on [place]

## 2021-01-02 NOTE — Telephone Encounter (Signed)
Please call patients son Legrand Como) to clarify what Dr Livia Snellen advised at her last visit regarding a nursing home.

## 2021-01-04 ENCOUNTER — Other Ambulatory Visit: Payer: Self-pay

## 2021-01-04 ENCOUNTER — Encounter: Payer: Self-pay | Admitting: Physician Assistant

## 2021-01-04 ENCOUNTER — Ambulatory Visit (INDEPENDENT_AMBULATORY_CARE_PROVIDER_SITE_OTHER): Payer: Medicare Other | Admitting: Physician Assistant

## 2021-01-04 VITALS — BP 169/75 | HR 58 | Temp 97.4°F | Ht 61.0 in | Wt 130.4 lb

## 2021-01-04 DIAGNOSIS — R300 Dysuria: Secondary | ICD-10-CM | POA: Diagnosis not present

## 2021-01-04 LAB — URINALYSIS
Bilirubin, UA: NEGATIVE
Glucose, UA: NEGATIVE
Ketones, UA: NEGATIVE
Nitrite, UA: NEGATIVE
Protein,UA: NEGATIVE
Specific Gravity, UA: 1.015 (ref 1.005–1.030)
Urobilinogen, Ur: 0.2 mg/dL (ref 0.2–1.0)
pH, UA: 5 (ref 5.0–7.5)

## 2021-01-04 MED ORDER — NITROFURANTOIN MONOHYD MACRO 100 MG PO CAPS: 100.0000 mg | ORAL_CAPSULE | Freq: Two times a day (BID) | ORAL | 0 refills | Status: DC

## 2021-01-04 NOTE — Patient Instructions (Signed)
Dysuria ?Dysuria is pain or discomfort during urination. The pain or discomfort may be felt in the part of the body that drains urine from the bladder (urethra) or in the surrounding tissue of the genitals. The pain may also be felt in the groin area, lower abdomen, or lower back. ?You may have to urinate frequently or have the sudden feeling that you have to urinate (urgency). Dysuria can affect anyone, but it is more common in females. Dysuria can be caused by many different things, including: ?Urinary tract infection. ?Kidney stones or bladder stones. ?Certain STIs (sexually transmitted infections), such as chlamydia. ?Dehydration. ?Inflammation of the tissues of the vagina. ?Use of certain medicines. ?Use of certain soaps or scented products that cause irritation. ?Follow these instructions at home: ?Medicines ?Take over-the-counter and prescription medicines only as told by your health care provider. ?If you were prescribed an antibiotic medicine, take it as told by your health care provider. Do not stop taking the antibiotic even if you start to feel better. ?Eating and drinking ? ?Drink enough fluid to keep your urine pale yellow. ?Avoid caffeinated beverages, tea, and alcohol. These beverages can irritate the bladder and make dysuria worse. In males, alcohol may irritate the prostate. ?General instructions ?Watch your condition for any changes. ?Urinate often. Avoid holding urine for long periods of time. ?If you are female, you should wipe from front to back after urinating or having a bowel movement. Use each piece of toilet paper only once. ?Empty your bladder after sex. ?Keep all follow-up visits. This is important. ?If you had any tests done to find the cause of dysuria, it is up to you to get your test results. Ask your health care provider, or the department that is doing the test, when your results will be ready. ?Contact a health care provider if: ?You have a fever. ?You develop pain in your back or  sides. ?You have nausea or vomiting. ?You have blood in your urine. ?You are not urinating as often as you usually do. ?Get help right away if: ?Your pain is severe and not relieved with medicines. ?You cannot eat or drink without vomiting. ?You are confused. ?You have a rapid heartbeat while resting. ?You have shaking or chills. ?You feel extremely weak. ?Summary ?Dysuria is pain or discomfort while urinating. Many different conditions can lead to dysuria. ?If you have dysuria, you may have to urinate frequently or have the sudden feeling that you have to urinate (urgency). ?Watch your condition for any changes. Keep all follow-up visits. ?Make sure that you urinate often and drink enough fluid to keep your urine pale yellow. ?This information is not intended to replace advice given to you by your health care provider. Make sure you discuss any questions you have with your health care provider. ?Document Revised: 02/12/2020 Document Reviewed: 02/12/2020 ?Elsevier Patient Education ? 2022 Elsevier Inc[employer] ? ?

## 2021-01-04 NOTE — Progress Notes (Signed)
  Subjective:     Patient ID: Crystal Browning, female   DOB: [birth year], 87 y.o.   MRN: [id number]  Neck Pain  Pertinent negatives include no fever.  Flank Pain Associated symptoms include dysuria. Pertinent negatives include no fever.  Pt with bilat flank pain and polyuria/dysuria for 4-5 days Some assoc general malaise/fatigue  Review of Systems  Constitutional:  Positive for fatigue. Negative for activity change, appetite change and fever.  Genitourinary:  Positive for decreased urine volume, dysuria and flank pain. Negative for urgency.  Musculoskeletal:  Positive for neck pain.      Objective:   Physical Exam Vitals and nursing note reviewed.  Constitutional:      General: She is not in acute distress.    Appearance: Normal appearance. She is not ill-appearing or toxic-appearing.  Abdominal:     General: Abdomen is flat. There is no distension.     Palpations: Abdomen is soft. There is no mass.     Tenderness: There is abdominal tenderness. There is no guarding or rebound.     Comments: Generalized TTP  Neurological:     Mental Status: She is alert.  UA with trace hematuria/Leuk Unable to do micro due to sample size      Assessment:    1. Dysuria        Plan:     Hydrate Continue OTC meds for sx Macrobid rx Keep f/u appt with Dr Livia Snellen

## 2021-01-09 ENCOUNTER — Other Ambulatory Visit: Payer: Self-pay | Admitting: Family Medicine

## 2021-01-13 ENCOUNTER — Other Ambulatory Visit: Payer: Self-pay

## 2021-01-13 ENCOUNTER — Emergency Department (HOSPITAL_COMMUNITY)
Admission: EM | Admit: 2021-01-13 | Discharge: 2021-01-13 | Disposition: A | Discharge status: Home / Self Care | Payer: Medicare Other | Attending: Emergency Medicine | Admitting: Emergency Medicine

## 2021-01-13 ENCOUNTER — Encounter (HOSPITAL_COMMUNITY): Payer: Self-pay

## 2021-01-13 DIAGNOSIS — I4891 Unspecified atrial fibrillation: Secondary | ICD-10-CM | POA: Insufficient documentation

## 2021-01-13 DIAGNOSIS — N3000 Acute cystitis without hematuria: Secondary | ICD-10-CM | POA: Diagnosis not present

## 2021-01-13 DIAGNOSIS — F0390 Unspecified dementia without behavioral disturbance: Secondary | ICD-10-CM | POA: Diagnosis not present

## 2021-01-13 DIAGNOSIS — R300 Dysuria: Secondary | ICD-10-CM | POA: Diagnosis present

## 2021-01-13 DIAGNOSIS — E039 Hypothyroidism, unspecified: Secondary | ICD-10-CM | POA: Diagnosis not present

## 2021-01-13 DIAGNOSIS — I10 Essential (primary) hypertension: Secondary | ICD-10-CM | POA: Insufficient documentation

## 2021-01-13 DIAGNOSIS — Z8521 Personal history of malignant neoplasm of larynx: Secondary | ICD-10-CM | POA: Diagnosis not present

## 2021-01-13 DIAGNOSIS — Z79899 Other long term (current) drug therapy: Secondary | ICD-10-CM | POA: Insufficient documentation

## 2021-01-13 DIAGNOSIS — Z7901 Long term (current) use of anticoagulants: Secondary | ICD-10-CM | POA: Diagnosis not present

## 2021-01-13 HISTORY — DX: Unspecified dementia, unspecified severity, without behavioral disturbance, psychotic disturbance, mood disturbance, and anxiety: F03.90

## 2021-01-13 LAB — CBC WITH DIFFERENTIAL/PLATELET
Abs Immature Granulocytes: 0.01 10*3/uL (ref 0.00–0.07)
Basophils Absolute: 0 10*3/uL (ref 0.0–0.1)
Basophils Relative: 0 %
Eosinophils Absolute: 0.3 10*3/uL (ref 0.0–0.5)
Eosinophils Relative: 6 %
HCT: 37.3 % (ref 36.0–46.0)
Hemoglobin: 12.1 g/dL (ref 12.0–15.0)
Immature Granulocytes: 0 %
Lymphocytes Relative: 26 %
Lymphs Abs: 1.4 10*3/uL (ref 0.7–4.0)
MCH: 32.2 pg (ref 26.0–34.0)
MCHC: 32.4 g/dL (ref 30.0–36.0)
MCV: 99.2 fL (ref 80.0–100.0)
Monocytes Absolute: 0.6 10*3/uL (ref 0.1–1.0)
Monocytes Relative: 11 %
Neutro Abs: 3 10*3/uL (ref 1.7–7.7)
Neutrophils Relative %: 57 %
Platelets: 181 10*3/uL (ref 150–400)
RBC: 3.76 MIL/uL — ABNORMAL LOW (ref 3.87–5.11)
RDW: 13.1 % (ref 11.5–15.5)
WBC: 5.2 10*3/uL (ref 4.0–10.5)
nRBC: 0 % (ref 0.0–0.2)

## 2021-01-13 LAB — URINALYSIS, ROUTINE W REFLEX MICROSCOPIC
Bilirubin Urine: NEGATIVE
Glucose, UA: NEGATIVE mg/dL
Ketones, ur: NEGATIVE mg/dL
Nitrite: NEGATIVE
Protein, ur: NEGATIVE mg/dL
Specific Gravity, Urine: 1.015 (ref 1.005–1.030)
WBC, UA: >50 WBC/hpf — ABNORMAL HIGH (ref 0–5)
pH: 6 (ref 5.0–8.0)

## 2021-01-13 LAB — BASIC METABOLIC PANEL
Anion gap: 8 (ref 5–15)
BUN: 11 mg/dL (ref 8–23)
CO2: 25 mmol/L (ref 22–32)
Calcium: 8.8 mg/dL — ABNORMAL LOW (ref 8.9–10.3)
Chloride: 106 mmol/L (ref 98–111)
Creatinine, Ser: 0.83 mg/dL (ref 0.44–1.00)
GFR, Estimated: >60 mL/min (ref >60)
Glucose, Bld: 101 mg/dL — ABNORMAL HIGH (ref 70–99)
Potassium: 4.1 mmol/L (ref 3.5–5.1)
Sodium: 139 mmol/L (ref 135–145)

## 2021-01-13 MED ORDER — CEPHALEXIN 500 MG PO CAPS: 500.0000 mg | ORAL_CAPSULE | Freq: Four times a day (QID) | ORAL | 0 refills | Status: DC

## 2021-01-13 MED ORDER — SODIUM CHLORIDE 0.9 % IV SOLN
1.0000 g | Freq: Once | INTRAVENOUS | Status: AC
  Administered 2021-01-13: 1 g via INTRAVENOUS
  Filled 2021-01-13: qty 10

## 2021-01-13 MED ORDER — FLUCONAZOLE 150 MG PO TABS: 150.0000 mg | ORAL_TABLET | Freq: Once | ORAL | 0 refills | Status: AC

## 2021-01-13 NOTE — ED Provider Notes (Signed)
[facility] EMERGENCY DEPARTMENT Provider Note   CSN: [id number] Arrival date & time: 01/13/21  2000     History Chief Complaint  Patient presents with   ? UTI    Treated for UTI on 6/22    Crystal Browning is a 87 y.o. female.  Patient with dysuria.  Patient has been on Macrobid recently  The history is provided by the patient and medical records. No language interpreter was used.  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   Radiation    S/P radiation thrapy -06/2005   TIA (transient ischemic attack)    History of TIA's with no residual effect    Patient Active Problem List   Diagnosis Date Noted   Hallucination 12/09/2020   Pharyngoesophageal dysphagia 01/14/2018   Long term (current) use of anticoagulants 12/14/2016   Atrial  fibrillation ([place]) 10/30/2016   Other fatigue 10/30/2016   Venous insufficiency of both lower extremities 10/30/2016   History of laryngeal cancer 08/20/2016   Xerostomia 08/20/2016   Postmenopausal 07/17/2015   Thyroid activity decreased 07/17/2015   Essential hypertension 07/12/2015   Angina pectoris ([place]) 07/12/2015   Vitamin D deficiency 07/12/2015    Past Surgical History:  Procedure Laterality Date   CARDIOVERSION N/A 11/23/2016   Procedure: CARDIOVERSION;  Surgeon: Pixie Casino, MD;  Location: [place];  Service: Cardiovascular;  Laterality: N/A;   CATARACT EXTRACTION, BILATERAL     DIRECT LARYNGOSCOPY  03/27/2005   S/P direct laryngoscopy, esophagoscopy and biopsy with Dr. Constance Holster   TOTAL ABDOMINAL HYSTERECTOMY W/ BILATERAL SALPINGOOPHORECTOMY  1986   TUBAL LIGATION       OB History   No obstetric history on file.     Family History  Problem Relation Age of Onset   Stroke Mother    Heart disease Mother 4   Cancer Father 36       Prostate   Alcohol abuse Brother    COPD Brother     Social History   Tobacco Use   Smoking status: Never   Smokeless tobacco: Never  Vaping Use   Vaping Use: Never used  Substance Use Topics   Alcohol use: No   Drug use: No    Home Medications Prior to Admission medications   Medication Sig Start  Date End Date Taking? Authorizing Provider  acetaminophen (TYLENOL) 325 MG tablet Take 2 tablets (650 mg total) by mouth every 6 (six) hours as needed for mild pain, fever or headache. 10/07/19  Yes Emokpae, Courage, MD  apixaban (ELIQUIS) 5 MG TABS tablet Take 1 tablet (5 mg total) by mouth 2 (two) times daily. (NEEDS TO BE SEEN BEFORE NEXT REFILL) 12/26/20  Yes Stacks, Cletus Gash, MD  cephALEXin (KEFLEX) 500 MG capsule Take 1 capsule (500 mg total) by mouth 4 (four) times daily. 01/13/21  Yes Milton Ferguson, MD  escitalopram (LEXAPRO) 10 MG tablet TAKE 1 TABLET BY MOUTH EVERY DAY 11/28/20  Yes [name], MD  famotidine (PEPCID) 20  MG tablet Take 1 tablet (20 mg total) by mouth every 12 (twelve) hours. 03/30/20  Yes [name], MD  fluconazole (DIFLUCAN) 150 MG tablet Take 1 tablet (150 mg total) by mouth once for 1 dose. 01/13/21 01/13/21 Yes Milton Ferguson, MD  levothyroxine (SYNTHROID) 50 MCG tablet TAKE 1 TABLET BY MOUTH EVERY DAY 01/02/21  Yes [name], MD  metoprolol succinate (TOPROL-XL) 100 MG 24 hr tablet TAKE 1 TABLET DAILY WITH OR IMMEDIATELY FOLLOWING A MEAL. 12/05/20  Yes [name], MD  QUEtiapine (SEROQUEL) 25 MG tablet Take 1 tablet (25 mg total) by mouth at bedtime. Patient taking differently: Take 12.5 mg by mouth at bedtime. 12/22/20  Yes [name], MD  TOVIAZ 4 MG TB24 tablet TAKE 1 TABLET BY MOUTH EVERY DAY 01/09/21  Yes [name], MD  loratadine (CLARITIN) 10 MG tablet Take 10 mg by mouth daily. Patient not taking: Reported on 01/13/2021    [provider]  nitrofurantoin, macrocrystal-monohydrate, (MACROBID) 100 MG capsule Take 1 capsule (100 mg total) by mouth 2 (two) times daily. 1 po BId Patient not taking: Reported on 01/13/2021 01/04/21   Lodema Pilot, PA-C  solifenacin (VESICARE) 5 MG tablet Take 1 tablet (5 mg total) by mouth daily. Patient not taking: Reported on 01/13/2021 10/10/20 10/10/21  [name], MD    Allergies    Amlodipine besylate, Tuberculin tests, Claritin [loratadine], Paroxetine hcl, and Vit d-vit e-safflower oil  Review of Systems   Review of Systems  Constitutional:  Negative for appetite change and fatigue.  HENT:  Negative for congestion, ear discharge and sinus pressure.   Eyes:  Negative for discharge.  Respiratory:  Negative for cough.   Cardiovascular:  Negative for chest pain.  Gastrointestinal:  Negative for abdominal pain and diarrhea.  Genitourinary:  Positive for dysuria. Negative for frequency and hematuria.  Musculoskeletal:  Negative for back pain.  Skin:  Negative for rash.  Neurological:  Negative for seizures and headaches.   Psychiatric/Behavioral:  Negative for hallucinations.    Physical Exam Updated Vital Signs BP (!) 181/87   Pulse 70   Temp 98.2 F (36.8 C) (Oral)   Resp 20   Ht 5\' 1"  (1.549 m)   Wt 60 kg   SpO2 96%   BMI 24.99 kg/m   Physical Exam Vitals and nursing note reviewed.  Constitutional:      Appearance: She is well-developed.  HENT:     Head: Normocephalic.     Nose: Nose normal.  Eyes:     General: No scleral icterus.    Conjunctiva/sclera: Conjunctivae normal.  Neck:     Thyroid: No thyromegaly.  Cardiovascular:     Rate and Rhythm: Normal rate and regular rhythm.     Heart sounds: No murmur heard.   No friction rub. No gallop.  Pulmonary:     Breath sounds: No stridor. No wheezing or rales.  Chest:     Chest wall: No tenderness.  Abdominal:     General: There is no distension.     Tenderness: There is no abdominal tenderness. There is no rebound.  Genitourinary:    Comments: Redness around opening the vagina but no obvious discharge Musculoskeletal:        General: Normal range of motion.     Cervical back: Neck supple.  Lymphadenopathy:     Cervical: No cervical adenopathy.  Skin:    Findings: No erythema or rash.  Neurological:     Mental Status: She is alert and oriented to person, place, and time.     Motor: No abnormal muscle tone.     Coordination: Coordination normal.  Psychiatric:        Behavior: Behavior normal.    ED Results / Procedures / Treatments   Labs (all labs ordered are listed, but only abnormal results are displayed) Labs Reviewed  URINALYSIS, ROUTINE W REFLEX MICROSCOPIC - Abnormal; Notable for the following components:      Result Value   APPearance HAZY (*)    Hgb urine dipstick SMALL (*)    Leukocytes,Ua LARGE (*)    WBC, UA >50 (*)    Bacteria, UA RARE (*)    All other components within normal limits  BASIC METABOLIC PANEL - Abnormal; Notable for the following components:   Glucose, Bld 101 (*)    Calcium 8.8 (*)    All  other components within normal limits  CBC WITH DIFFERENTIAL/PLATELET - Abnormal; Notable for the following components:   RBC 3.76 (*)    All other components within normal limits  URINE CULTURE    EKG None  Radiology No results found.  Procedures Procedures   Medications Ordered in ED Medications  cefTRIAXone (ROCEPHIN) 1 g in sodium chloride 0.9 % 100 mL IVPB (1 g Intravenous New Bag/Given 01/13/21 2217)    ED Course  I have reviewed the triage vital signs and the nursing notes.  Pertinent labs & imaging results that were available during my care of the patient were reviewed by me and considered in my medical decision making (see chart for details).    MDM Rules/Calculators/A&P                          Patient with urinary tract infection.  She is put on Keflex.  She was on Macrobid before.  She also has some itching around her vagina and we will go and treat that with some Diflucan Final Clinical Impression(s) / ED Diagnoses Final diagnoses:  Acute cystitis without hematuria    Rx / DC Orders ED Discharge Orders          Ordered    cephALEXin (KEFLEX) 500 MG capsule  4 times daily        01/13/21 2305    fluconazole (DIFLUCAN) 150 MG tablet   Once        01/13/21 2305             Milton Ferguson, MD 01/17/21 1050

## 2021-01-13 NOTE — Discharge Instructions (Addendum)
Follow-up with your doctor next week for recheck 

## 2021-01-13 NOTE — ED Triage Notes (Signed)
Pt presents to Ed from home with c/o dysuria. Family says she was treated for UTI on 6/22 by family doctor and has completed all antibiotics, but symptoms still persist. Pt also c/o itching, redness (rawness) this week.

## 2021-01-15 LAB — URINE CULTURE

## 2021-01-18 ENCOUNTER — Ambulatory Visit (INDEPENDENT_AMBULATORY_CARE_PROVIDER_SITE_OTHER): Payer: Medicare Other

## 2021-01-18 VITALS — Ht 61.0 in | Wt 130.0 lb

## 2021-01-18 DIAGNOSIS — Z0000 Encounter for general adult medical examination without abnormal findings: Secondary | ICD-10-CM

## 2021-01-18 NOTE — Patient Instructions (Signed)
Crystal Browning , Thank you for taking time to come for your Medicare Wellness Visit. I appreciate your ongoing commitment to your health goals. Please review the following plan we discussed and let me know if I can assist you in the future.   Screening recommendations/referrals: Colonoscopy: No longer required  Mammogram: Done 07/12/2015 - no longer required, but still recommended annually Bone Density: Done 02/26/2013 - no longer required, but still recommended every 2 years Recommended yearly ophthalmology/optometry visit for glaucoma screening and checkup Recommended yearly dental visit for hygiene and checkup  Vaccinations: Influenza vaccine: Done 06/02/2020 - Repeat annually  Pneumococcal vaccine: Done 10/05/2019, second dose due now Tdap vaccine: Due (every 10 years) Shingles vaccine: Due. Shingrix discussed. Please contact your pharmacy for coverage information.    Covid-19: Declined  Advanced directives: Please bring a copy of your health care power of attorney and living will to the office to be added to your chart at your convenience.   Conditions/risks identified: Aim for 30 minutes of exercise each day, drink 6-8 glasses of water and eat lots of fruits and vegetables.   Next appointment: Follow up in one year for your annual wellness visit    Preventive Care 65 Years and Older, Female Preventive care refers to lifestyle choices and visits with your health care provider that can promote health and wellness. What does preventive care include? A yearly physical exam. This is also called an annual well check. Dental exams once or twice a year. Routine eye exams. Ask your health care provider how often you should have your eyes checked. Personal lifestyle choices, including: Daily care of your teeth and gums. Regular physical activity. Eating a healthy diet. Avoiding tobacco and drug use. Limiting alcohol use. Practicing safe sex. Taking low-dose aspirin every day. Taking  vitamin and mineral supplements as recommended by your health care provider. What happens during an annual well check? The services and screenings done by your health care provider during your annual well check will depend on your age, overall health, lifestyle risk factors, and family history of disease. Counseling  Your health care provider may ask you questions about your: Alcohol use. Tobacco use. Drug use. Emotional well-being. Home and relationship well-being. Sexual activity. Eating habits. History of falls. Memory and ability to understand (cognition). Work and work Statistician. Reproductive health. Screening  You may have the following tests or measurements: Height, weight, and BMI. Blood pressure. Lipid and cholesterol levels. These may be checked every 5 years, or more frequently if you are over 81 years old. Skin check. Lung cancer screening. You may have this screening every year starting at age 18 if you have a 30-pack-year history of smoking and currently smoke or have quit within the past 15 years. Fecal occult blood test (FOBT) of the stool. You may have this test every year starting at age 40. Flexible sigmoidoscopy or colonoscopy. You may have a sigmoidoscopy every 5 years or a colonoscopy every 10 years starting at age 36. Hepatitis C blood test. Hepatitis B blood test. Sexually transmitted disease (STD) testing. Diabetes screening. This is done by checking your blood sugar (glucose) after you have not eaten for a while (fasting). You may have this done every 1-3 years. Bone density scan. This is done to screen for osteoporosis. You may have this done starting at age 23. Mammogram. This may be done every 1-2 years. Talk to your health care provider about how often you should have regular mammograms. Talk with your health care provider about  your test results, treatment options, and if necessary, the need for more tests. Vaccines  Your health care provider may  recommend certain vaccines, such as: Influenza vaccine. This is recommended every year. Tetanus, diphtheria, and acellular pertussis (Tdap, Td) vaccine. You may need a Td booster every 10 years. Zoster vaccine. You may need this after age 71. Pneumococcal 13-valent conjugate (PCV13) vaccine. One dose is recommended after age 26. Pneumococcal polysaccharide (PPSV23) vaccine. One dose is recommended after age 68. Talk to your health care provider about which screenings and vaccines you need and how often you need them. This information is not intended to replace advice given to you by your health care provider. Make sure you discuss any questions you have with your health care provider. Document Released: 07/29/2015 Document Revised: 03/21/2016 Document Reviewed: 05/03/2015 Elsevier Interactive Patient Education  2017 [employer] Prevention in the Home Falls can cause injuries. They can happen to people of all ages. There are many things you can do to make your home safe and to help prevent falls. What can I do on the outside of my home? Regularly fix the edges of walkways and driveways and fix any cracks. Remove anything that might make you trip as you walk through a door, such as a raised step or threshold. Trim any bushes or trees on the path to your home. Use bright outdoor lighting. Clear any walking paths of anything that might make someone trip, such as rocks or tools. Regularly check to see if handrails are loose or broken. Make sure that both sides of any steps have handrails. Any raised decks and porches should have guardrails on the edges. Have any leaves, snow, or ice cleared regularly. Use sand or salt on walking paths during winter. Clean up any spills in your garage right away. This includes oil or grease spills. What can I do in the bathroom? Use night lights. Install grab bars by the toilet and in the tub and shower. Do not use towel bars as grab bars. Use non-skid  mats or decals in the tub or shower. If you need to sit down in the shower, use a plastic, non-slip stool. Keep the floor dry. Clean up any water that spills on the floor as soon as it happens. Remove soap buildup in the tub or shower regularly. Attach bath mats securely with double-sided non-slip rug tape. Do not have throw rugs and other things on the floor that can make you trip. What can I do in the bedroom? Use night lights. Make sure that you have a light by your bed that is easy to reach. Do not use any sheets or blankets that are too big for your bed. They should not hang down onto the floor. Have a firm chair that has side arms. You can use this for support while you get dressed. Do not have throw rugs and other things on the floor that can make you trip. What can I do in the kitchen? Clean up any spills right away. Avoid walking on wet floors. Keep items that you use a lot in easy-to-reach places. If you need to reach something above you, use a strong step stool that has a grab bar. Keep electrical cords out of the way. Do not use floor polish or wax that makes floors slippery. If you must use wax, use non-skid floor wax. Do not have throw rugs and other things on the floor that can make you trip. What can I do with  my stairs? Do not leave any items on the stairs. Make sure that there are handrails on both sides of the stairs and use them. Fix handrails that are broken or loose. Make sure that handrails are as long as the stairways. Check any carpeting to make sure that it is firmly attached to the stairs. Fix any carpet that is loose or worn. Avoid having throw rugs at the top or bottom of the stairs. If you do have throw rugs, attach them to the floor with carpet tape. Make sure that you have a light switch at the top of the stairs and the bottom of the stairs. If you do not have them, ask someone to add them for you. What else can I do to help prevent falls? Wear shoes  that: Do not have high heels. Have rubber bottoms. Are comfortable and fit you well. Are closed at the toe. Do not wear sandals. If you use a stepladder: Make sure that it is fully opened. Do not climb a closed stepladder. Make sure that both sides of the stepladder are locked into place. Ask someone to hold it for you, if possible. Clearly mark and make sure that you can see: Any grab bars or handrails. First and last steps. Where the edge of each step is. Use tools that help you move around (mobility aids) if they are needed. These include: Canes. Walkers. Scooters. Crutches. Turn on the lights when you go into a dark area. Replace any light bulbs as soon as they burn out. Set up your furniture so you have a clear path. Avoid moving your furniture around. If any of your floors are uneven, fix them. If there are any pets around you, be aware of where they are. Review your medicines with your doctor. Some medicines can make you feel dizzy. This can increase your chance of falling. Ask your doctor what other things that you can do to help prevent falls. This information is not intended to replace advice given to you by your health care provider. Make sure you discuss any questions you have with your health care provider. Document Released: 04/28/2009 Document Revised: 12/08/2015 Document Reviewed: 08/06/2014 Elsevier Interactive Patient Education  2017 [employer].

## 2021-01-18 NOTE — Progress Notes (Signed)
Subjective:   Crystal Browning is a 87 y.o. female who presents for an Initial Medicare Annual Wellness Visit.  Virtual Visit via Telephone Note  I connected with  Crystal Browning on 01/18/21 at 11:15 AM EDT by telephone and verified that I am speaking with the correct person using two identifiers.  Location: Patient: Home Provider: [facility] Persons participating in the virtual visit: patient/Nurse Health Advisor   I discussed the limitations, risks, security and privacy concerns of performing an evaluation and management service by telephone and the availability of in person appointments. The patient expressed understanding and agreed to proceed.  Interactive audio and video telecommunications were attempted between this nurse and patient, however failed, due to patient having technical difficulties OR patient did not have access to video capability.  We continued and completed visit with audio only.  Some vital signs may be absent or patient reported.   Kenijah Benningfield E Drystan Reader, LPN   Review of Systems     Cardiac Risk Factors include: advanced age (>90men, >80 women);dyslipidemia;hypertension;sedentary lifestyle     Objective:    Today's Vitals   01/18/21 1122  Weight: 130 lb (59 kg)  Height: 5\' 1"  (1.549 m)  PainSc: 2    Body mass index is 24.56 kg/m.  Advanced Directives 01/13/2021 08/28/2020 07/18/2020 05/30/2020 10/04/2019 11/23/2016 09/23/2015  Does Patient Have a Medical Advance Directive? No No No No No No No  Would patient like information on creating a medical advance directive? No - Patient declined - No - Patient declined - No - Patient declined No - Patient declined -    Current Medications (verified) Outpatient Encounter Medications as of 01/18/2021  Medication Sig   acetaminophen (TYLENOL) 325 MG tablet Take 2 tablets (650 mg total) by mouth every 6 (six) hours as needed for mild pain, fever or headache.   apixaban (ELIQUIS) 5 MG TABS tablet Take 1 tablet (5 mg total) by mouth 2  (two) times daily. (NEEDS TO BE SEEN BEFORE NEXT REFILL)   cephALEXin (KEFLEX) 500 MG capsule Take 1 capsule (500 mg total) by mouth 4 (four) times daily.   escitalopram (LEXAPRO) 10 MG tablet TAKE 1 TABLET BY MOUTH EVERY DAY   famotidine (PEPCID) 20 MG tablet Take 1 tablet (20 mg total) by mouth every 12 (twelve) hours.   fluconazole (DIFLUCAN) 150 MG tablet Take 150 mg by mouth once.   levothyroxine (SYNTHROID) 50 MCG tablet TAKE 1 TABLET BY MOUTH EVERY DAY   loratadine (CLARITIN) 10 MG tablet Take 10 mg by mouth daily. (Patient not taking: Reported on 01/13/2021)   metoprolol succinate (TOPROL-XL) 100 MG 24 hr tablet TAKE 1 TABLET DAILY WITH OR IMMEDIATELY FOLLOWING A MEAL.   nitrofurantoin, macrocrystal-monohydrate, (MACROBID) 100 MG capsule Take 1 capsule (100 mg total) by mouth 2 (two) times daily. 1 po BId (Patient not taking: Reported on 01/13/2021)   QUEtiapine (SEROQUEL) 25 MG tablet Take 1 tablet (25 mg total) by mouth at bedtime. (Patient taking differently: Take 12.5 mg by mouth at bedtime.)   solifenacin (VESICARE) 5 MG tablet Take 1 tablet (5 mg total) by mouth daily. (Patient not taking: Reported on 01/13/2021)   TOVIAZ 4 MG TB24 tablet TAKE 1 TABLET BY MOUTH EVERY DAY   No facility-administered encounter medications on file as of 01/18/2021.    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 Constance Holster   TOTAL ABDOMINAL HYSTERECTOMY W/ BILATERAL SALPINGOOPHORECTOMY  1986   TUBAL LIGATION     Family History  Problem Relation Age of Onset   Stroke Mother    Heart disease Mother 76   Cancer Father 51       Prostate   Alcohol abuse Brother    COPD Brother    Social History   Socioeconomic History   Marital status: Widowed    Spouse name: Not on file   Number of children: 6   Years of education: Not on file   Highest education level: Not on file  Occupational History   Not on file  Tobacco Use   Smoking status: Never   Smokeless tobacco: Never  Vaping Use   Vaping Use: Never used  Substance and Sexual Activity   Alcohol use: No   Drug use: No   Sexual activity: Not on file  Other Topics Concern   Not on file  Social History Narrative   Lives alone in her mobile home. Children live nearby - they take her to appts and help out as needed.   Social Determinants of Health   Financial Resource Strain: Low Risk    Difficulty of Paying Living Expenses: Not hard at all  Food Insecurity: No Food Insecurity   Worried About Charity fundraiser in the Last Year: Never true    [place] in the Last Year: Never true  Transportation Needs: No Transportation Needs   Lack of Transportation (Medical): No   Lack of Transportation (Non-Medical): No  Physical Activity: Inactive   Days of Exercise per Week: 0 days   Minutes of Exercise per Session: 0 min  Stress: No Stress Concern Present   Feeling of Stress : Only a little  Social Connections: Moderately Isolated   Frequency of Communication with Friends and Family: More than three times a week   Frequency of Social Gatherings with Friends and Family: More than three times a week   Attends Religious Services: 1 to 4 times per year   Active Member of [employer] or Organizations: No   Attends Archivist Meetings: Never   Marital Status: Widowed  Tobacco Counseling Counseling given: Not Answered   Clinical Intake:  Pre-visit preparation completed: Yes  Pain : 0-10 Pain Score: 2  Faces Pain Scale: Hurts a little bit Pain Type: Chronic pain Pain Location: Back Pain Orientation: Lower Pain Descriptors / Indicators: Dull, Discomfort Pain Onset: More than a month ago Pain Frequency: Intermittent  Faces Pain Scale: Hurts a little bit  BMI - recorded: 24.56 Nutritional Status: BMI of 19-24  Normal Nutritional Risks: Nausea/ vomitting/ diarrhea (currently taking antibiotics) Diabetes: No  How often do you need to have someone help you when you read instructions, pamphlets, or other written materials from your doctor or pharmacy?: 3 - Sometimes  Diabetes: No  Interpreter Needed?: No  Information entered by :: Monteen Toops, LPN   Activities of Daily Living In your present state of health, do you have any difficulty performing the following activities: 01/18/2021  Hearing? Y  Vision? N  Difficulty concentrating or making decisions? Y  Walking or climbing stairs? N  Dressing or bathing? N  Doing errands, shopping? Y  Comment she can't drive - her children take her to appts  Preparing Food  and eating ? N  Using the Toilet? N  In the past six months, have you accidently leaked urine? Y  Do you have problems with loss of bowel control? N  Managing your Medications? N  Managing your Finances? N  Housekeeping or managing your Housekeeping? N  Some recent data might be hidden    Patient Care Team: [name], MD as PCP - General (Family Medicine)  Indicate any recent Medical Services you may have received from other than [facility] providers in the past year (date may be approximate).     Assessment:   This is a routine wellness examination for Crystal Browning.  Hearing/Vision screen Hearing Screening - Comments:: C/o moderate hearing difficulties - declines hearing aids Vision Screening - Comments:: Wears eyeglasses - up to date with annual eye exams at [place] issues and exercise activities discussed: Current Exercise Habits: Home exercise routine, Type of exercise: walking, Time (Minutes): 10, Frequency (Times/Week): 7, Weekly Exercise (Minutes/Week): 70, Intensity: Mild, Exercise limited by: orthopedic condition(s)   Goals Addressed             This Visit's Progress    Exercise 3x per week (30 min per time)         Depression Screen PHQ 2/9 Scores 01/18/2021 01/04/2021 12/22/2020 11/02/2020 10/26/2020 10/10/2020 10/10/2020  PHQ - 2 Score 0 0 4 0 0 1 0  PHQ- 9 Score 2 1 10  - - 6 -  Exception Documentation - - - - - - -  Not completed - - - - - - -    Fall Risk Fall Risk  01/18/2021 12/22/2020 12/07/2020 11/02/2020 10/26/2020  Falls in the past year? 1 1 1 1 1   Number falls in past yr: 0 1 1 1 1   Injury with Fall? 1 1 1 1 1   Risk for fall due to : History of fall(s);Impaired vision;Medication side effect;Orthopedic patient;Mental status change History of fall(s);Impaired balance/gait History of fall(s);Impaired balance/gait History of fall(s);Impaired balance/gait History of fall(s);Impaired balance/gait  Follow up Falls prevention discussed;Education provided Falls  evaluation completed Education provided Falls evaluation completed Falls evaluation completed    [place]:  Any stairs in or around the home? No  If so, are there any without handrails? No  Home free of loose throw rugs in walkways, pet beds, electrical cords,  etc? Yes  Adequate lighting in your home to reduce risk of falls? Yes   ASSISTIVE DEVICES UTILIZED TO PREVENT FALLS:  Life alert? No  Use of a cane, walker or w/c? No  Grab bars in the bathroom? No  Shower chair or bench in shower? No  Elevated toilet seat or a handicapped toilet? No   TIMED UP AND GO:  Was the test performed? No . Telephonic visi  Cognitive Function: Cognitive status assessed by direct observation. Patient has current diagnosis of cognitive impairment. Patient is unable to complete screening 6CIT or MMSE.   MMSE - Mini Mental State Exam 12/07/2020 06/29/2020  Orientation to time 5 4  Orientation to Place 2 4  Registration 3 3  Attention/ Calculation 0 2  Recall 1 3  Language- name 2 objects 2 2  Language- repeat 1 0  Language- follow 3 step command 2 3  Language- read & follow direction 1 1  Write a sentence 0 0  Copy design 0 0  Total score 17 22        Immunizations Immunization History  Administered Date(s) Administered   Fluad Quad(high Dose 65+) 10/05/2019, 06/02/2020   Influenza Inj Mdck Quad Pf 10/05/2019   Influenza Split 05/27/2006   Influenza, High Dose Seasonal PF 06/11/2018   Pneumococcal Polysaccharide-23 10/05/2019    TDAP status: Due, Education has been provided regarding the importance of this vaccine. Advised may receive this vaccine at local pharmacy or Health Dept. Aware to provide a copy of the vaccination record if obtained from local pharmacy or Health Dept. Verbalized acceptance and understanding.  Flu Vaccine status: Up to date  Pneumococcal vaccine status: Due, Education has been provided regarding the importance of this vaccine.  Advised may receive this vaccine at local pharmacy or Health Dept. Aware to provide a copy of the vaccination record if obtained from local pharmacy or Health Dept. Verbalized acceptance and understanding.  Covid-19 vaccine status: Declined, Education has been provided regarding the importance of this vaccine but patient still declined. Advised may receive this vaccine at local pharmacy or Health Dept.or vaccine clinic. Aware to provide a copy of the vaccination record if obtained from local pharmacy or Health Dept. Verbalized acceptance and understanding.  Qualifies for Shingles Vaccine? Yes   Zostavax completed No   Shingrix Completed?: No.    Education has been provided regarding the importance of this vaccine. Patient has been advised to call insurance company to determine out of pocket expense if they have not yet received this vaccine. Advised may also receive vaccine at local pharmacy or Health Dept. Verbalized acceptance and understanding.  Screening Tests Health Maintenance  Topic Date Due   COVID-19 Vaccine (1) Never done   Zoster Vaccines- Shingrix (1 of 2) Never done   TETANUS/TDAP  07/11/2021 (Originally 10/01/1952)   PNA vac Low Risk Adult (2 of 2 - PCV13) 10/26/2021 (Originally 10/04/2020)   INFLUENZA VACCINE  02/13/2021   DEXA SCAN  Completed   HPV VACCINES  Aged Out    Health Maintenance  Health Maintenance Due  Topic Date Due   COVID-19 Vaccine (1) Never done   Zoster Vaccines- Shingrix (1 of 2) Never done    Colorectal cancer screening: No longer required.   Mammogram status: No longer required due to age.  Bone Density status: Completed 02/26/2013. Results reflect: Bone density results: OSTEOPENIA. Repeat every 2 years.  Lung Cancer Screening: (Low Dose CT Chest recommended if Age 57-80 years, 30 pack-year currently smoking OR have quit  w/in 15years.) does not qualify.   Additional Screening:  Hepatitis C Screening: does not qualify  Vision Screening:  Recommended annual ophthalmology exams for early detection of glaucoma and other disorders of the eye. Is the patient up to date with their annual eye exam?  Yes  Who is the provider or what is the name of the office in which the patient attends annual eye exams? [place] If pt is not established with a provider, would they like to be referred to a provider to establish care? No .   Dental Screening: Recommended annual dental exams for proper oral hygiene  Community Resource Referral / Chronic Care Management: CRR required this visit?  No   CCM required this visit?  No      Plan:     I have personally reviewed and noted the following in the patient's chart:   Medical and social history Use of alcohol, tobacco or illicit drugs  Current medications and supplements including opioid prescriptions. Patient is not currently taking opioid prescriptions. Functional ability and status Nutritional status Physical activity Advanced directives List of other physicians Hospitalizations, surgeries, and ER visits in previous 12 months Vitals Screenings to include cognitive, depression, and falls Referrals and appointments  In addition, I have reviewed and discussed with patient certain preventive protocols, quality metrics, and best practice recommendations. A written personalized care plan for preventive services as well as general preventive health recommendations were provided to patient.     Sandrea Hammond, LPN   08/16/1153   Nurse Notes: None

## 2021-01-19 ENCOUNTER — Ambulatory Visit (INDEPENDENT_AMBULATORY_CARE_PROVIDER_SITE_OTHER): Payer: Medicare Other | Admitting: Family Medicine

## 2021-01-19 ENCOUNTER — Encounter: Payer: Self-pay | Admitting: Family Medicine

## 2021-01-19 ENCOUNTER — Other Ambulatory Visit: Payer: Self-pay

## 2021-01-19 VITALS — BP 132/71 | HR 65 | Temp 97.9°F | Ht 61.0 in | Wt 131.0 lb

## 2021-01-19 DIAGNOSIS — E039 Hypothyroidism, unspecified: Secondary | ICD-10-CM

## 2021-01-19 DIAGNOSIS — N390 Urinary tract infection, site not specified: Secondary | ICD-10-CM

## 2021-01-19 LAB — URINALYSIS
Bilirubin, UA: NEGATIVE
Glucose, UA: NEGATIVE
Ketones, UA: NEGATIVE
Leukocytes,UA: NEGATIVE
Nitrite, UA: NEGATIVE
Protein,UA: NEGATIVE
RBC, UA: NEGATIVE
Specific Gravity, UA: <1.005 — ABNORMAL LOW (ref 1.005–1.030)
Urobilinogen, Ur: 0.2 mg/dL (ref 0.2–1.0)
pH, UA: 6 (ref 5.0–7.5)

## 2021-01-19 NOTE — Progress Notes (Signed)
Subjective:  Patient ID: Crystal Browning, female    DOB: Jul 11, 1934  Age: 87 y.o. MRN: [id number]  CC: ER F/U and Hypothyroidism   HPI Crystal Browning presents for E.D. follow up. Treated for UTI. That has cleared.  No longer hallucinating because she is sleeping better.    follow-up on  thyroid. The patient has a history of hypothyroidism for many years.  Recently the TSH  was checked and was elevated.  The remaining parameters were in normal range so she is in today for recheck.  No dose change of her levothyroxine was done at that time.  Pt. denies any change in  voice, loss of hair, heat or cold intolerance. Energy level has been adequate to good. Patient denies constipation and diarrhea. No myxedema. Medication is as noted below. Verified that pt is taking it daily on an empty stomach. Well tolerated.   Depression screen [facility] 2/9 01/19/2021 01/18/2021 01/04/2021  Decreased Interest 0 0 0  Down, Depressed, Hopeless 0 0 0  PHQ - 2 Score 0 0 0  Altered sleeping - 0 0  Tired, decreased energy - 1 0  Change in appetite - 1 1  Feeling bad or failure about yourself  - 0 0  Trouble concentrating - 0 0  Moving slowly or fidgety/restless - 0 0  Suicidal thoughts - 0 0  PHQ-9 Score - 2 1  Difficult doing work/chores - Not difficult at all Not difficult at all  Some recent data might be hidden    History Crystal Browning has a past medical history of Arthritis, Atrial fibrillation ([place]) (10/30/2016), Bilateral lower extremity edema (10/30/2016), Cancer ([place]) (03/28/2011), CAP (community acquired pneumonia) (10/04/2019), Dementia ([place]), Depression, Diverticulosis (02/2005), Esophageal stricture (03/03/2018), Generalized abdominal pain (07/17/2015), History of chemotherapy, History of external beam radiation therapy (08/20/2016), Hypertension, Hypothyroid, Osteoporosis, 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   She has a past surgical history that  includes Direct laryngoscopy (03/27/2005); Tubal ligation; Total abdominal hysterectomy w/ bilateral salpingoophorectomy (1986); Cataract extraction, bilateral; and Cardioversion (N/A, 11/23/2016).   Her family history includes Alcohol abuse in her brother; COPD in her brother; Cancer (age of onset: 48) in her father; Heart disease (age of onset: 68) in her mother; Stroke in her mother.She reports that she has never smoked. She has never used smokeless tobacco. She reports that she does not drink alcohol and does not use drugs.    ROS Review of Systems  Constitutional: Negative.   HENT: Negative.    Eyes:  Negative for visual disturbance.  Respiratory:  Negative for shortness of breath.   Cardiovascular:  Negative for chest pain.  Gastrointestinal:  Negative for abdominal pain.  Musculoskeletal:  Negative for arthralgias.   Objective:  BP 132/71   Pulse 65   Temp 97.9 F (36.6 C)   Ht 5\' 1"  (1.549 m)   Wt 131 lb (59.4 kg)   SpO2 96%   BMI 24.75 kg/m   BP Readings from Last 3 Encounters:  01/19/21 132/71  01/13/21 (!) 168/72  01/04/21 (!) 169/75    Wt Readings from Last 3 Encounters:  01/19/21 131 lb (59.4 kg)  01/18/21 130 lb (59 kg)  01/13/21 132 lb 4.4 oz (60 kg)     Physical Exam Constitutional:      General: She is not in acute distress.    Appearance: She is well-developed.  Cardiovascular:     Rate and Rhythm: Normal rate and regular rhythm.  Pulmonary:     Breath sounds: Normal breath sounds.  Musculoskeletal:        General: Normal range of motion.     Right lower leg: No edema.  Skin:    General: Skin is warm and dry.  Neurological:     Mental Status: She is alert and oriented to person, place, and time.      Assessment & Plan:   Truly was seen today for er f/u and hypothyroidism.  Diagnoses and all orders for this visit:  Hypothyroidism, unspecified type -     TSH + free T4  Frequent UTI -     Urinalysis -     Urine Culture      I am  having Crystal Browning maintain her acetaminophen, famotidine, loratadine, solifenacin, escitalopram, metoprolol succinate, QUEtiapine, Eliquis, levothyroxine, nitrofurantoin (macrocrystal-monohydrate), Toviaz, cephALEXin, and fluconazole.  Allergies as of 01/19/2021       Reactions   Amlodipine Besylate Hives   Hives and confusion.   Tuberculin Tests Swelling   Claritin [loratadine] Rash   Pt's daughter states she is allergic to claritin   Paroxetine Hcl Rash   Vit D-vit E-safflower Oil         Medication List        Accurate as of January 19, 2021  5:47 PM. If you have any questions, ask your nurse or doctor.          acetaminophen 325 MG tablet Commonly known as: TYLENOL Take 2 tablets (650 mg total) by mouth every 6 (six) hours as needed for mild pain, fever or headache.   cephALEXin 500 MG capsule Commonly known as: KEFLEX Take 1 capsule (500 mg total) by mouth 4 (four) times daily.   Eliquis 5 MG Tabs tablet Generic drug: apixaban Take 1 tablet (5 mg total) by mouth 2 (two) times daily. (NEEDS TO BE SEEN BEFORE NEXT REFILL)   escitalopram 10 MG tablet Commonly known as: LEXAPRO TAKE 1 TABLET BY MOUTH EVERY DAY   famotidine 20 MG tablet Commonly known as: PEPCID Take 1 tablet (20 mg total) by mouth every 12 (twelve) hours.   fluconazole 150 MG tablet Commonly known as: DIFLUCAN Take 150 mg by mouth once.   levothyroxine 50 MCG tablet Commonly known as: SYNTHROID TAKE 1 TABLET BY MOUTH EVERY DAY   loratadine 10 MG tablet Commonly known as: CLARITIN Take 10 mg by mouth daily.   metoprolol succinate 100 MG 24 hr tablet Commonly known as: TOPROL-XL TAKE 1 TABLET DAILY WITH OR IMMEDIATELY FOLLOWING A MEAL.   nitrofurantoin (macrocrystal-monohydrate) 100 MG capsule Commonly known as: Macrobid Take 1 capsule (100 mg total) by mouth 2 (two) times daily. 1 po BId   QUEtiapine 25 MG tablet Commonly known as: SEROQUEL Take 1 tablet (25 mg total) by mouth at  bedtime. What changed: how much to take   solifenacin 5 MG tablet Commonly known as: VESICARE Take 1 tablet (5 mg total) by mouth daily.   Toviaz 4 MG Tb24 tablet Generic drug: fesoterodine TAKE 1 TABLET BY MOUTH EVERY DAY         Follow-up: Return in about 3 months (around 04/21/2021).  [name], M.D.

## 2021-01-20 LAB — TSH+FREE T4
Free T4: 1.25 ng/dL (ref 0.82–1.77)
TSH: 3.36 u[IU]/mL (ref 0.450–4.500)

## 2021-01-21 LAB — URINE CULTURE

## 2021-01-23 NOTE — Progress Notes (Signed)
Hello Lakshmi,  Your lab result is normal and/or stable.Some minor variations that are not significant are commonly marked abnormal, but do not represent any medical problem for you.  Best regards, Talishia Betzler, M.D.

## 2021-01-29 ENCOUNTER — Other Ambulatory Visit: Payer: Self-pay | Admitting: Family Medicine

## 2021-01-29 DIAGNOSIS — I4891 Unspecified atrial fibrillation: Secondary | ICD-10-CM

## 2021-02-06 ENCOUNTER — Other Ambulatory Visit: Payer: Self-pay | Admitting: Family Medicine

## 2021-02-24 ENCOUNTER — Other Ambulatory Visit: Payer: Self-pay

## 2021-02-24 ENCOUNTER — Encounter: Payer: Self-pay | Admitting: Nurse Practitioner

## 2021-02-24 ENCOUNTER — Ambulatory Visit (INDEPENDENT_AMBULATORY_CARE_PROVIDER_SITE_OTHER): Payer: Medicare Other | Admitting: Nurse Practitioner

## 2021-02-24 DIAGNOSIS — Z1212 Encounter for screening for malignant neoplasm of rectum: Secondary | ICD-10-CM | POA: Diagnosis not present

## 2021-02-24 DIAGNOSIS — N3000 Acute cystitis without hematuria: Secondary | ICD-10-CM

## 2021-02-24 LAB — URINALYSIS, COMPLETE
Bilirubin, UA: NEGATIVE
Glucose, UA: NEGATIVE
Ketones, UA: NEGATIVE
Nitrite, UA: NEGATIVE
Protein,UA: NEGATIVE
Specific Gravity, UA: 1.015 (ref 1.005–1.030)
Urobilinogen, Ur: 0.2 mg/dL (ref 0.2–1.0)
pH, UA: 7 (ref 5.0–7.5)

## 2021-02-24 LAB — MICROSCOPIC EXAMINATION: Epithelial Cells (non renal): NONE SEEN /hpf (ref 0–10)

## 2021-02-24 MED ORDER — CEPHALEXIN 500 MG PO CAPS: 500.0000 mg | ORAL_CAPSULE | Freq: Two times a day (BID) | ORAL | 0 refills | Status: DC

## 2021-02-24 MED ORDER — FLUCONAZOLE 150 MG PO TABS: 150.0000 mg | ORAL_TABLET | Freq: Once | ORAL | 0 refills | Status: AC

## 2021-02-24 NOTE — Addendum Note (Signed)
Addended by: Rolena Infante on: 02/24/2021 04:18 PM   Modules accepted: Orders

## 2021-02-24 NOTE — Progress Notes (Signed)
   Virtual Visit  Note Due to COVID-19 pandemic this visit was conducted virtually. This visit type was conducted due to national recommendations for restrictions regarding the COVID-19 Pandemic (e.g. social distancing, sheltering in place) in an effort to limit this patient's exposure and mitigate transmission in our community. All issues noted in this document were discussed and addressed.  A physical exam was not performed with this format.  I connected with Crystal Browning on 02/24/21 at 3:30 by telephone and verified that I am speaking with the correct person using two identifiers. Crystal Browning is currently located at home and no one is currently with her during visit. The provider, Mary-Margaret Hassell Done, FNP is located in their office at time of visit.  I discussed the limitations, risks, security and privacy concerns of performing an evaluation and management service by telephone and the availability of in person appointments. I also discussed with the patient that there may be a patient responsible charge related to this service. The patient expressed understanding and agreed to proceed.   History and Present Illness:  Chief Complaint: Urinary Tract Infection and Vaginitis (/)   HPI Patient says she has urinary frequency with burning on urintaion. Has some vaginal itching. Patinet says she enerally doe snt feel well.    Review of Systems  Constitutional:  Negative for chills and fever.  Respiratory: Negative.    Cardiovascular: Negative.   Genitourinary:  Positive for dysuria, frequency and urgency. Negative for flank pain and hematuria.  All other systems reviewed and are negative.   Observations/Objective: Alert and oriented- answers all questions appropriately No distress    Assessment and Plan: Crystal Browning in today with chief complaint of Urinary Tract Infection and Vaginitis (/)   1. Acute cystitis without hematuria/ vaginitis Cranberry juice AZO OTC Meds ordered  this encounter  Medications   cephALEXin (KEFLEX) 500 MG capsule    Sig: Take 1 capsule (500 mg total) by mouth 2 (two) times daily.    Dispense:  14 capsule    Refill:  0    Order Specific Question:   Supervising Provider    Answer:   Caryl Pina A [[id number]]   fluconazole (DIFLUCAN) 150 MG tablet    Sig: Take 1 tablet (150 mg total) by mouth once for 1 dose.    Dispense:  1 tablet    Refill:  0    Order Specific Question:   Supervising Provider    Answer:   Caryl Pina A [id number]       Follow Up Instructions: prn    I discussed the assessment and treatment plan with the patient. The patient was provided an opportunity to ask questions and all were answered. The patient agreed with the plan and demonstrated an understanding of the instructions.   The patient was advised to call back or seek an in-person evaluation if the symptoms worsen or if the condition fails to improve as anticipated.  The above assessment and management plan was discussed with the patient. The patient verbalized understanding of and has agreed to the management plan. Patient is aware to call the clinic if symptoms persist or worsen. Patient is aware when to return to the clinic for a follow-up visit. Patient educated on when it is appropriate to go to the emergency department.   Time call ended:  3:43  I provided 13  minutes of  non face-to-face time during this encounter.    Mary-Margaret Hassell Done, FNP

## 2021-02-25 ENCOUNTER — Other Ambulatory Visit: Payer: Self-pay | Admitting: Family Medicine

## 2021-02-25 DIAGNOSIS — F419 Anxiety disorder, unspecified: Secondary | ICD-10-CM

## 2021-02-27 ENCOUNTER — Encounter: Payer: Self-pay | Admitting: Family Medicine

## 2021-02-27 ENCOUNTER — Other Ambulatory Visit: Payer: Self-pay

## 2021-02-27 ENCOUNTER — Ambulatory Visit (INDEPENDENT_AMBULATORY_CARE_PROVIDER_SITE_OTHER): Payer: Medicare Other | Admitting: Family Medicine

## 2021-02-27 VITALS — BP 123/73 | HR 64 | Temp 98.0°F | Ht 61.0 in | Wt 128.5 lb

## 2021-02-27 DIAGNOSIS — F0391 Unspecified dementia with behavioral disturbance: Secondary | ICD-10-CM | POA: Diagnosis not present

## 2021-02-27 DIAGNOSIS — R441 Visual hallucinations: Secondary | ICD-10-CM | POA: Diagnosis not present

## 2021-02-27 DIAGNOSIS — I10 Essential (primary) hypertension: Secondary | ICD-10-CM | POA: Diagnosis not present

## 2021-02-27 DIAGNOSIS — R531 Weakness: Secondary | ICD-10-CM | POA: Diagnosis not present

## 2021-02-27 DIAGNOSIS — T300 Burn of unspecified body region, unspecified degree: Secondary | ICD-10-CM | POA: Diagnosis not present

## 2021-02-27 DIAGNOSIS — R609 Edema, unspecified: Secondary | ICD-10-CM | POA: Diagnosis not present

## 2021-02-27 DIAGNOSIS — F03B18 Unspecified dementia, moderate, with other behavioral disturbance: Secondary | ICD-10-CM

## 2021-02-27 NOTE — Patient Instructions (Signed)
Dementia Dementia is a condition that affects the way the brain functions. It often affects memory and thinking. Usually, dementia gets worse with time and cannot be reversed (progressive dementia). There are many types of dementia, including: Alzheimer's disease. This type is the most common. Vascular dementia. This type may happen as the result of a stroke. Lewy body dementia. This type may happen to people who have Parkinson's disease. Frontotemporal dementia. This type is caused by damage to nerve cells (neurons) in certain parts of the brain. Some people may be affected by more than one type of dementia. This is calledmixed dementia. What are the causes? Dementia is caused by damage to cells in the brain. The area of the brain and the types of cells damaged determine the type of dementia. Usually, this damage is irreversible or cannot be undone. Some examples of irreversible causes include: Conditions that affect the blood vessels of the brain, such as diabetes, heart disease, or blood vessel disease. Genetic mutations. In some cases, changes in the brain may be caused by another condition and can be reversed or slowed. Some examples of reversible causes include: Injury to the brain. Certain medicines. Infection, such as meningitis. Metabolic problems, such as vitamin B12 deficiency or thyroid disease. Pressure on the brain, such as from a tumor, blood clot, or too much fluid in the brain (hydrocephalus). Autoimmune diseases that affect the brain or arteries, such as limbic encephalitis or vasculitis. What are the signs or symptoms? Symptoms of dementia depend on the type of dementia. Common signs of dementia include problems with remembering, thinking, problem solving, decision making, and communicating. These signs develop slowly or get worse with time. This may include: Problems remembering events or people. Having trouble taking a bath or putting clothes on. Forgetting appointments or  forgetting to pay bills. Difficulty planning and preparing meals. Having trouble speaking. Getting lost easily. Changes in behavior or mood. How is this diagnosed? This condition is diagnosed by a specialist (neurologist). It is diagnosed based on the history of your symptoms, your medical history, a physical exam, and tests. Tests may include: Tests to evaluate brain function, such as memory tests, cognitive tests, and other tests. Lab tests, such as blood or urine tests. Imaging tests, such as a CT scan, a PET scan, or an MRI. Genetic testing. This may be done if other family members have a diagnosis of certain types of dementia. Your health care provider will talk with you and your family, friends, orcaregivers about your history and symptoms. How is this treated? Treatment for this condition depends on the cause of the dementia. Progressive dementias, such as Alzheimer's disease, cannot be cured, but there may betreatments that help to manage symptoms. Treatment might involve taking medicines that may help to: Control the dementia. Slow down the progression of the dementia. Manage symptoms. In some cases, treating the cause of your dementia can improve symptoms,reverse symptoms, or slow down how quickly your dementia becomes worse. Your health care provider can direct you to support groups, organizations, andother health care providers who can help with decisions about your care. Follow these instructions at home: Medicines Take over-the-counter and prescription medicines only as told by your health care provider. Use a pill organizer or pill reminder to help you manage your medicines. Avoid taking medicines that can affect thinking, such as pain medicines or sleeping medicines. Lifestyle Make healthy lifestyle choices. Be physically active as told by your health care provider. Do not use any products that contain nicotine or  tobacco, such as cigarettes, e-cigarettes, and chewing  tobacco. If you need help quitting, ask your health care provider. Do not drink alcohol. Practice stress-management techniques when you get stressed. Spend time with other people. Make sure to get quality sleep. These tips can help you get a good night's rest: Avoid napping during the day. Keep your sleeping area dark and cool. Avoid exercising during the few hours before you go to bed. Avoid caffeine products in the evening. Eating and drinking Drink enough fluid to keep your urine pale yellow. Eat a healthy diet. General instructions  Work with your health care provider to determine what you need help with and what your safety needs are. Talk with your health care provider about whether it is safe for you to drive. If you were given a bracelet that identifies you as a person with memory loss or tracks your location, make sure to wear it at all times. Work with your family to make important decisions, such as advance directives, medical power of attorney, or a living will. Keep all follow-up visits. This is important.  Where to find more information Alzheimer's Association: [employer] National Institute on Aging: DVDEnthusiasts.nl World Health Organization: RoleLink.com.br Contact a health care provider if: You have any new or worsening symptoms. You have problems with choking or swallowing. Get help right away if: You feel depressed or sad, or feel that you want to harm yourself. Your family members become concerned for your safety. If you ever feel like you may hurt yourself or others, or have thoughts about taking your own life, get help right away. Go to your nearest emergency department or: Call your local emergency services (911 in the [place] Call a suicide crisis helpline, such as the [place] at 856-640-3300. This is open 24 hours a day in the [place] Text the Crisis Text Line at (859) 550-8790 (in the [place].). Summary Dementia is a condition that  affects the way the brain functions. Dementia often affects memory and thinking. Usually, dementia gets worse with time and cannot be reversed (progressive dementia). Treatment for this condition depends on the cause of the dementia. Work with your health care provider to determine what you need help with and what your safety needs are. Your health care provider can direct you to support groups, organizations, and other health care providers who can help with decisions about your care. This information is not intended to replace advice given to you by your health care provider. Make sure you discuss any questions you have with your healthcare provider. Document Revised: 11/16/2019 Document Reviewed: 11/16/2019 Elsevier Patient Education  [place].

## 2021-02-27 NOTE — Progress Notes (Signed)
Acute Office Visit  Subjective:    Patient ID: Crystal Browning, female    DOB: 01-19-1934, 87 y.o.   MRN: [id number]  Chief Complaint  Patient presents with   Hallucinations    HPI Patient is in today for hallucinations for 1 year. She is here with her daughter today who provided the history. She was started on Seroquel for this her PCP for this. This was helpful at first but then it seemed to not help anymore. She has been taking 12.5 mg. Her PCP told her to take the full 25 mg tablet, but she has been afraid to. She reports seeing people in her yard and believes that a family is living under her house. These hallucinations occur daily. She also is forgetful and repeats herself often. She is often confused. She does not have any difficulty performing her ADLs. Her daughter wonders if she has dementia and would like for her to see a neurology for this.   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 Constance Holster   TOTAL ABDOMINAL HYSTERECTOMY W/ BILATERAL SALPINGOOPHORECTOMY  1986   TUBAL LIGATION      Family History  Problem Relation Age of Onset   Stroke Mother    Heart disease Mother 3   Cancer Father 11       Prostate   Alcohol abuse Brother    COPD Brother     Social History   Socioeconomic History   Marital status: Widowed    Spouse name: Not on file   Number of children: 6   Years of education: Not on file   Highest education level: Not on file  Occupational History   Not on file  Tobacco Use   Smoking status: Never   Smokeless tobacco: Never  Vaping Use   Vaping Use: Never used  Substance and Sexual Activity   Alcohol use: No   Drug use: No   Sexual activity: Not on file  Other Topics Concern   Not on file  Social History Narrative   Lives alone in her mobile home. Children live nearby - they take her to appts and help  out as needed.   Social Determinants of Health   Financial Resource Strain: Low Risk    Difficulty of Paying Living Expenses: Not hard at all  Food Insecurity: No Food Insecurity   Worried About Charity fundraiser in the Last Year: Never true   Maish [place] in the Last Year: Never true  Transportation Needs: No Transportation Needs   Lack of Transportation (Medical): No   Lack of Transportation (Non-Medical): No  Physical Activity: Inactive   Days of Exercise per Week: 0 days   Minutes of Exercise per Session: 0 min  Stress: No Stress Concern Present   Feeling of Stress : Only a little  Social Connections: Moderately Isolated   Frequency of Communication with Friends and Family: More than three times a week   Frequency of Social Gatherings with Friends and Family:  More than three times a week   Attends Religious Services: 1 to 4 times per year   Active Member of [employer] or Organizations: No   Attends Archivist Meetings: Never   Marital Status: Widowed  Human resources officer Violence: Not At Risk   Fear of Current or Ex-Partner: No   Emotionally Abused: No   Physically Abused: No   Sexually Abused: No    Outpatient Medications Prior to Visit  Medication Sig Dispense Refill   acetaminophen (TYLENOL) 325 MG tablet Take 2 tablets (650 mg total) by mouth every 6 (six) hours as needed for mild pain, fever or headache. 12 tablet 0   apixaban (ELIQUIS) 5 MG TABS tablet Take 1 tablet (5 mg total) by mouth 2 (two) times daily. 60 tablet 0   escitalopram (LEXAPRO) 10 MG tablet TAKE 1 TABLET BY MOUTH EVERY DAY 90 tablet 0   famotidine (PEPCID) 20 MG tablet Take 1 tablet (20 mg total) by mouth every 12 (twelve) hours. 180 tablet 1   levothyroxine (SYNTHROID) 50 MCG tablet TAKE 1 TABLET BY MOUTH EVERY DAY 90 tablet 1   loratadine (CLARITIN) 10 MG tablet Take 10 mg by mouth daily.     metoprolol succinate (TOPROL-XL) 100 MG 24 hr tablet TAKE 1 TABLET DAILY WITH OR IMMEDIATELY FOLLOWING A MEAL. 90 tablet 0   solifenacin (VESICARE) 5 MG tablet Take 1 tablet (5 mg total) by mouth daily. 90 tablet 3   TOVIAZ 4 MG TB24 tablet TAKE 1 TABLET BY MOUTH EVERY DAY 30 tablet 0   cephALEXin (KEFLEX) 500 MG capsule Take 1 capsule (500 mg total) by mouth 2 (two) times daily. (Patient not taking: Reported on 02/27/2021) 14 capsule 0   QUEtiapine (SEROQUEL) 25 MG tablet Take 1 tablet (25 mg total) by mouth at bedtime. (Patient not taking: Reported on 02/27/2021) 90 tablet 1   nitrofurantoin, macrocrystal-monohydrate, (MACROBID) 100 MG capsule Take 1 capsule (100 mg total) by mouth 2 (two) times daily. 1 po BId 14 capsule 0   No facility-administered medications prior to visit.    Allergies  Allergen Reactions   Amlodipine Besylate Hives    Hives and confusion.    Tuberculin Tests Swelling   Claritin [Loratadine] Rash    Pt's daughter states she is allergic to claritin   Paroxetine Hcl Rash   Vit D-Vit E-Safflower Oil     Review of Systems As per HPI.     Objective:    Physical Exam Vitals and nursing note reviewed.  Constitutional:      General: She is not in acute distress.    Appearance: She is not ill-appearing, toxic-appearing  or diaphoretic.  Pulmonary:     Effort: Pulmonary effort is normal. No respiratory distress.  Musculoskeletal:     Right lower leg: No edema.     Left lower leg: No edema.  Skin:    General: Skin is warm and dry.  Neurological:     Mental Status: She is alert.  Psychiatric:        Attention and Perception: She is attentive.        Mood and Affect: Mood normal.        Behavior: Behavior normal.        Cognition and Memory: Cognition is not impaired.    BP 123/73   Pulse 64   Temp 98 F (36.7 C) (Temporal)   Ht 5' 1" (1.549 m)   Wt 128 lb 8 oz (58.3 kg)   BMI 24.28 kg/m  Wt Readings from Last 3 Encounters:  02/27/21 128 lb 8 oz (58.3 kg)  01/19/21 131 lb (59.4 kg)  01/18/21 130 lb (59 kg)    Health Maintenance Due  Topic Date Due   COVID-19 Vaccine (1) Never done   Zoster Vaccines- Shingrix (1 of 2) Never done    There are no preventive care reminders to display for this patient.   Lab Results  Component Value Date   TSH 3.360 01/19/2021   Lab Results  Component Value Date   WBC 5.2 01/13/2021   HGB 12.1 01/13/2021   HCT 37.3 01/13/2021   MCV 99.2 01/13/2021   PLT 181 01/13/2021   Lab Results  Component Value Date   NA 139 01/13/2021   K 4.1 01/13/2021   CO2 25 01/13/2021   GLUCOSE 101 (H) 01/13/2021   BUN 11 01/13/2021   CREATININE 0.83 01/13/2021   BILITOT 0.4 10/26/2020   ALKPHOS 89 10/26/2020   AST 33 10/26/2020   ALT 26 10/26/2020   PROT 6.5 10/26/2020   ALBUMIN 4.0 10/26/2020   CALCIUM 8.8 (L) 01/13/2021   ANIONGAP 8 01/13/2021   EGFR 78 10/26/2020   Lab  Results  Component Value Date   CHOL 166 03/30/2020   Lab Results  Component Value Date   HDL 54 03/30/2020   Lab Results  Component Value Date   LDLCALC 89 03/30/2020   Lab Results  Component Value Date   TRIG 131 03/30/2020   Lab Results  Component Value Date   CHOLHDL 3.1 03/30/2020   No results found for: HGBA1C     Assessment & Plan:   Toshika was seen today for hallucinations.  Diagnoses and all orders for this visit:  Visual hallucination Moderate dementia with behavioral disturbance (HCC) Visual hallucinations x1 year. MMSE score of 19 today. Referral to neurology placed for further evaluation. Discussed taking full 25 mg tablet of seroquel at bedtime.  -     Ambulatory referral to Neurology   Return to office for new or worsening symptoms, or if symptoms persist.   The patient indicates understanding of these issues and agrees with the plan.   Gwenlyn Perking, FNP

## 2021-03-01 ENCOUNTER — Ambulatory Visit: Payer: Medicare Other | Admitting: Family Medicine

## 2021-03-02 LAB — FECAL OCCULT BLOOD, IMMUNOCHEMICAL: Fecal Occult Bld: POSITIVE — AB

## 2021-03-04 ENCOUNTER — Other Ambulatory Visit: Payer: Self-pay | Admitting: Family Medicine

## 2021-03-04 DIAGNOSIS — I10 Essential (primary) hypertension: Secondary | ICD-10-CM

## 2021-03-04 DIAGNOSIS — I4891 Unspecified atrial fibrillation: Secondary | ICD-10-CM

## 2021-03-07 ENCOUNTER — Other Ambulatory Visit: Payer: Self-pay | Admitting: Family Medicine

## 2021-03-07 DIAGNOSIS — G3183 Dementia with Lewy bodies: Secondary | ICD-10-CM | POA: Diagnosis not present

## 2021-03-07 DIAGNOSIS — R441 Visual hallucinations: Secondary | ICD-10-CM | POA: Diagnosis not present

## 2021-03-07 DIAGNOSIS — F32A Depression, unspecified: Secondary | ICD-10-CM | POA: Diagnosis not present

## 2021-03-07 DIAGNOSIS — I4891 Unspecified atrial fibrillation: Secondary | ICD-10-CM | POA: Diagnosis not present

## 2021-03-07 DIAGNOSIS — R195 Other fecal abnormalities: Secondary | ICD-10-CM

## 2021-03-10 ENCOUNTER — Other Ambulatory Visit: Payer: Self-pay | Admitting: Family Medicine

## 2021-03-14 ENCOUNTER — Ambulatory Visit (INDEPENDENT_AMBULATORY_CARE_PROVIDER_SITE_OTHER): Payer: Medicare Other | Admitting: Nurse Practitioner

## 2021-03-14 ENCOUNTER — Encounter: Payer: Self-pay | Admitting: Nurse Practitioner

## 2021-03-14 ENCOUNTER — Other Ambulatory Visit: Payer: Self-pay

## 2021-03-14 ENCOUNTER — Emergency Department (HOSPITAL_COMMUNITY): Payer: Medicare Other

## 2021-03-14 ENCOUNTER — Emergency Department (HOSPITAL_COMMUNITY)
Admission: EM | Admit: 2021-03-14 | Discharge: 2021-03-14 | Disposition: A | Discharge status: Home / Self Care | Payer: Medicare Other | Attending: Emergency Medicine | Admitting: Emergency Medicine

## 2021-03-14 ENCOUNTER — Encounter (HOSPITAL_COMMUNITY): Payer: Self-pay | Admitting: *Deleted

## 2021-03-14 VITALS — BP 123/63 | HR 59 | Temp 97.2°F | Resp 20 | Ht 61.0 in | Wt 127.0 lb

## 2021-03-14 DIAGNOSIS — I10 Essential (primary) hypertension: Secondary | ICD-10-CM | POA: Insufficient documentation

## 2021-03-14 DIAGNOSIS — F0390 Unspecified dementia without behavioral disturbance: Secondary | ICD-10-CM | POA: Insufficient documentation

## 2021-03-14 DIAGNOSIS — R109 Unspecified abdominal pain: Secondary | ICD-10-CM | POA: Diagnosis present

## 2021-03-14 DIAGNOSIS — E039 Hypothyroidism, unspecified: Secondary | ICD-10-CM | POA: Diagnosis not present

## 2021-03-14 DIAGNOSIS — N390 Urinary tract infection, site not specified: Secondary | ICD-10-CM

## 2021-03-14 DIAGNOSIS — K760 Fatty (change of) liver, not elsewhere classified: Secondary | ICD-10-CM | POA: Diagnosis not present

## 2021-03-14 DIAGNOSIS — Z87440 Personal history of urinary (tract) infections: Secondary | ICD-10-CM

## 2021-03-14 DIAGNOSIS — Z79899 Other long term (current) drug therapy: Secondary | ICD-10-CM | POA: Insufficient documentation

## 2021-03-14 DIAGNOSIS — K869 Disease of pancreas, unspecified: Secondary | ICD-10-CM | POA: Insufficient documentation

## 2021-03-14 DIAGNOSIS — Z8639 Personal history of other endocrine, nutritional and metabolic disease: Secondary | ICD-10-CM | POA: Diagnosis not present

## 2021-03-14 LAB — CBC WITH DIFFERENTIAL/PLATELET
Abs Immature Granulocytes: 0.01 10*3/uL (ref 0.00–0.07)
Basophils Absolute: 0 10*3/uL (ref 0.0–0.1)
Basophils Relative: 0 %
Eosinophils Absolute: 0.4 10*3/uL (ref 0.0–0.5)
Eosinophils Relative: 6 %
HCT: 39.3 % (ref 36.0–46.0)
Hemoglobin: 13 g/dL (ref 12.0–15.0)
Immature Granulocytes: 0 %
Lymphocytes Relative: 26 %
Lymphs Abs: 1.4 10*3/uL (ref 0.7–4.0)
MCH: 32.6 pg (ref 26.0–34.0)
MCHC: 33.1 g/dL (ref 30.0–36.0)
MCV: 98.5 fL (ref 80.0–100.0)
Monocytes Absolute: 0.6 10*3/uL (ref 0.1–1.0)
Monocytes Relative: 11 %
Neutro Abs: 3.2 10*3/uL (ref 1.7–7.7)
Neutrophils Relative %: 57 %
Platelets: 195 10*3/uL (ref 150–400)
RBC: 3.99 MIL/uL (ref 3.87–5.11)
RDW: 14 % (ref 11.5–15.5)
WBC: 5.6 10*3/uL (ref 4.0–10.5)
nRBC: 0 % (ref 0.0–0.2)

## 2021-03-14 LAB — COMPREHENSIVE METABOLIC PANEL
ALT: 17 U/L (ref 0–44)
AST: 30 U/L (ref 15–41)
Albumin: 3.8 g/dL (ref 3.5–5.0)
Alkaline Phosphatase: 66 U/L (ref 38–126)
Anion gap: 7 (ref 5–15)
BUN: 15 mg/dL (ref 8–23)
CO2: 27 mmol/L (ref 22–32)
Calcium: 9.2 mg/dL (ref 8.9–10.3)
Chloride: 104 mmol/L (ref 98–111)
Creatinine, Ser: 0.92 mg/dL (ref 0.44–1.00)
GFR, Estimated: >60 mL/min (ref >60)
Glucose, Bld: 95 mg/dL (ref 70–99)
Potassium: 3.8 mmol/L (ref 3.5–5.1)
Sodium: 138 mmol/L (ref 135–145)
Total Bilirubin: 0.4 mg/dL (ref 0.3–1.2)
Total Protein: 7.1 g/dL (ref 6.5–8.1)

## 2021-03-14 LAB — URINALYSIS, COMPLETE
Bilirubin, UA: NEGATIVE
Glucose, UA: NEGATIVE
Nitrite, UA: NEGATIVE
Specific Gravity, UA: >1.03 — ABNORMAL HIGH (ref 1.005–1.030)
Urobilinogen, Ur: 0.2 mg/dL (ref 0.2–1.0)
pH, UA: 5 (ref 5.0–7.5)

## 2021-03-14 LAB — URINALYSIS, ROUTINE W REFLEX MICROSCOPIC
Bilirubin Urine: NEGATIVE
Glucose, UA: NEGATIVE mg/dL
Hgb urine dipstick: NEGATIVE
Ketones, ur: NEGATIVE mg/dL
Nitrite: NEGATIVE
Protein, ur: NEGATIVE mg/dL
Specific Gravity, Urine: 1.016 (ref 1.005–1.030)
pH: 5 (ref 5.0–8.0)

## 2021-03-14 LAB — LIPASE, BLOOD: Lipase: 30 U/L (ref 11–51)

## 2021-03-14 LAB — TROPONIN I (HIGH SENSITIVITY): Troponin I (High Sensitivity): 9 ng/L (ref <18)

## 2021-03-14 IMAGING — CT CT ABD-PELV W/ CM
2 of 5 series · 16 of 46 positions shown, 18 images · IV contrast (Omnipaque or Isovue)
Comparison: [DATE]

CLINICAL DATA: Bowel obstruction suspected in a 87-year-old female.

EXAM:
CT ABDOMEN AND PELVIS WITH CONTRAST
TECHNIQUE: Multidetector CT imaging of the abdomen and pelvis was performed
using the standard protocol following bolus administration of
intravenous contrast.
CONTRAST:  80mL OMNIPAQUE IOHEXOL 350 MG/ML SOLN

[Series 2: axial st · axial · 0.85mm/px · z∈[+640,+990]mm · 13 of 80 slices shown, 15 images]
[im 5/80  soft-tissue]
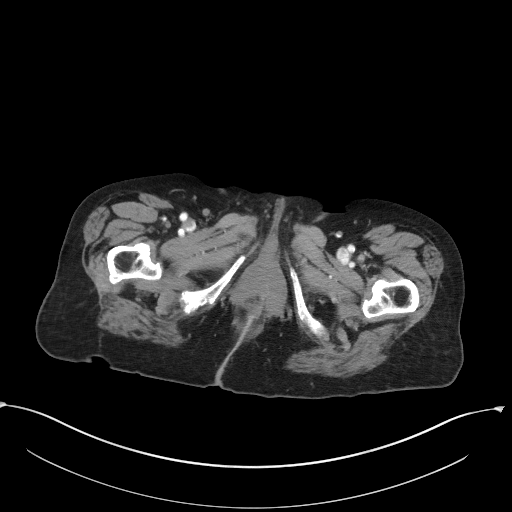
[im 5/80  bone]
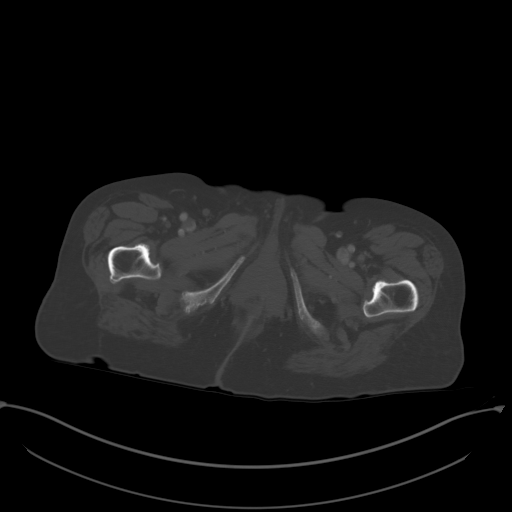
[im 9/80  soft-tissue]
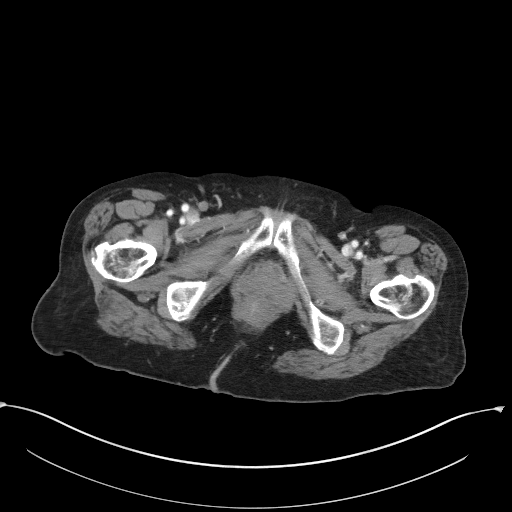
[im 18/80  soft-tissue]
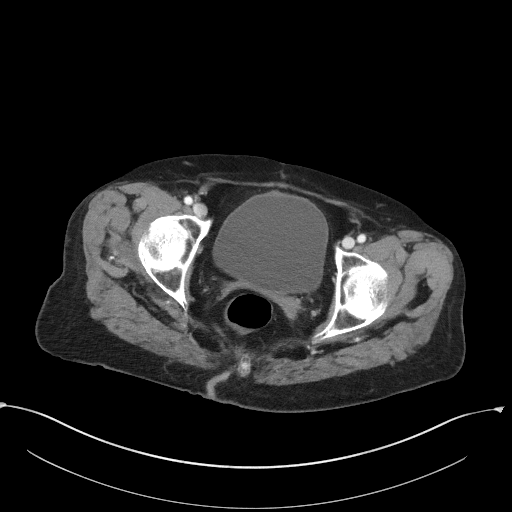
[im 22/80  soft-tissue]
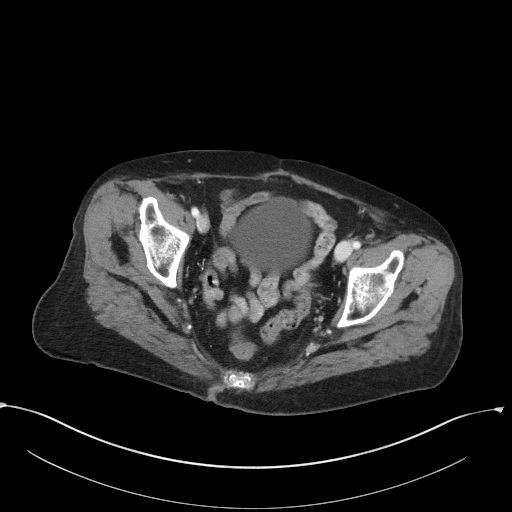
[im 27/80  soft-tissue]
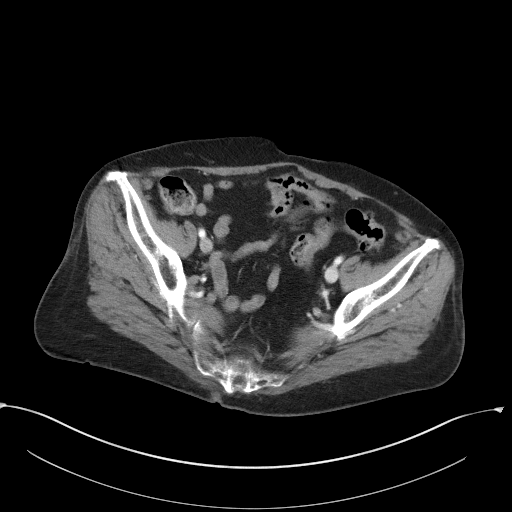
[im 36/80  soft-tissue]
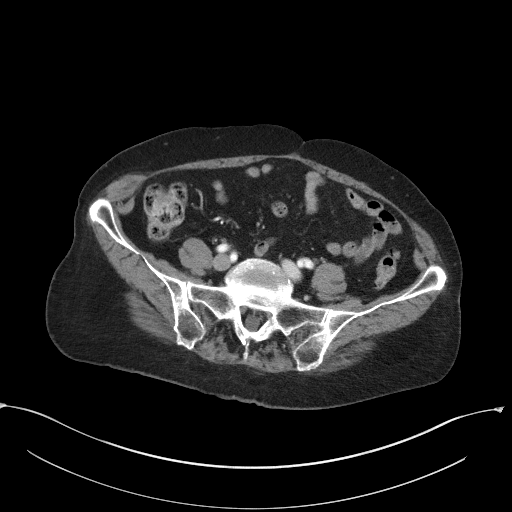
[im 40/80  soft-tissue]
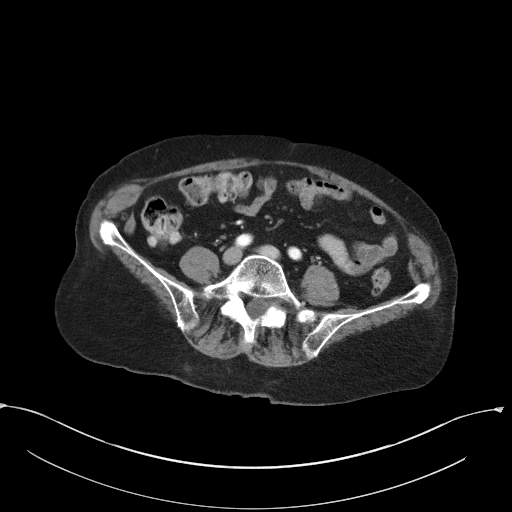
[im 44/80  soft-tissue]
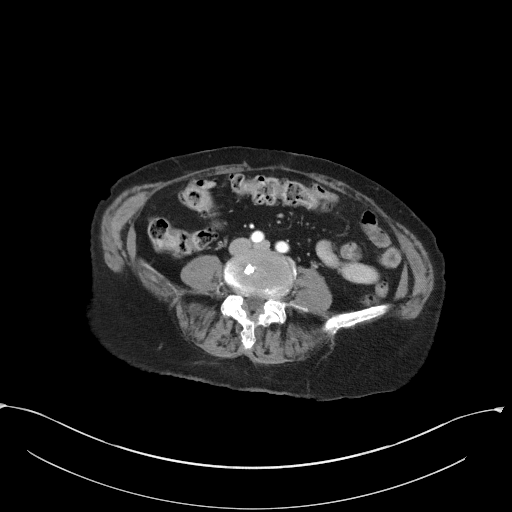
[im 53/80  soft-tissue]
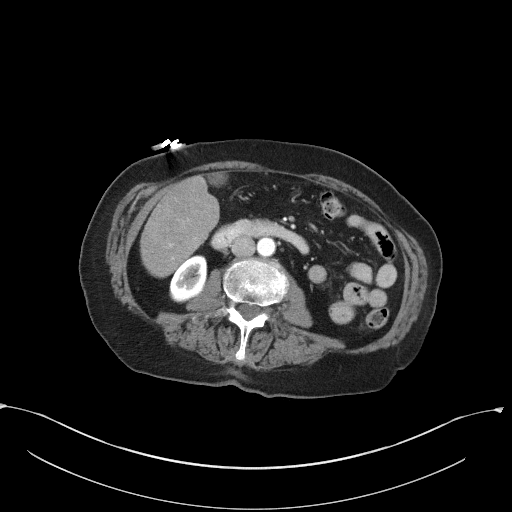
[im 53/80  bone]
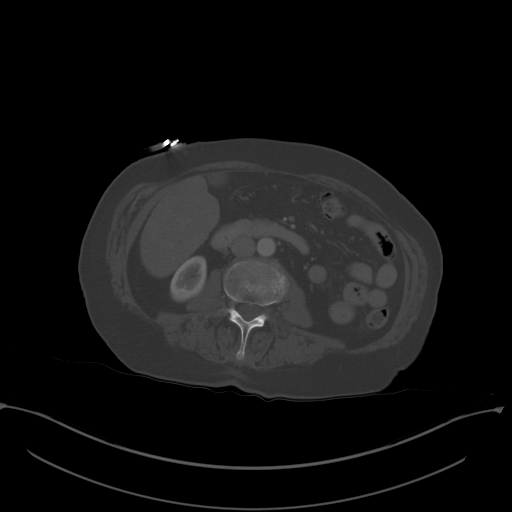
[im 58/80  soft-tissue]
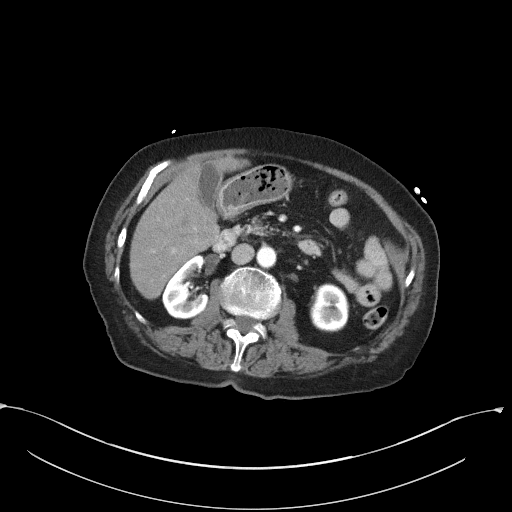
[im 62/80  soft-tissue]
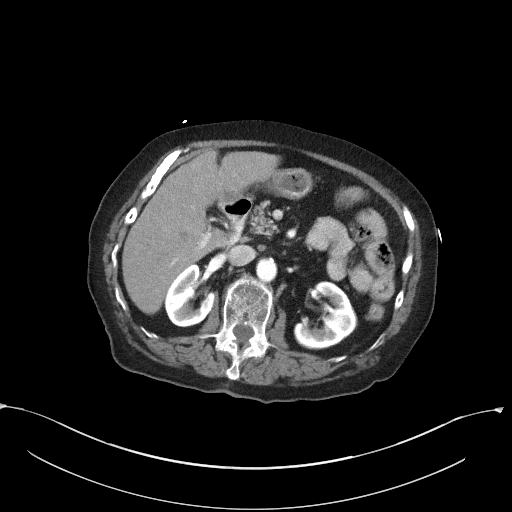
[im 71/80  soft-tissue]
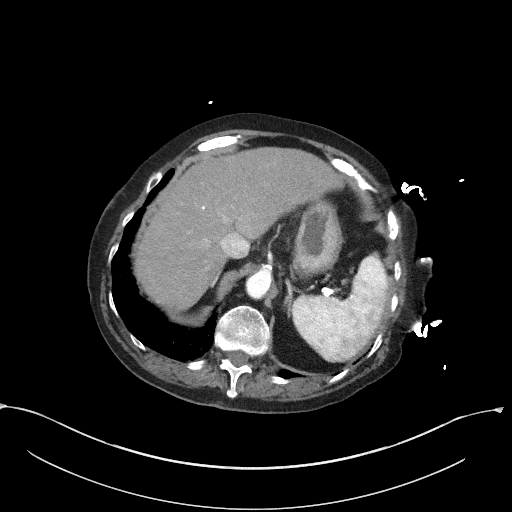
[im 75/80  soft-tissue]
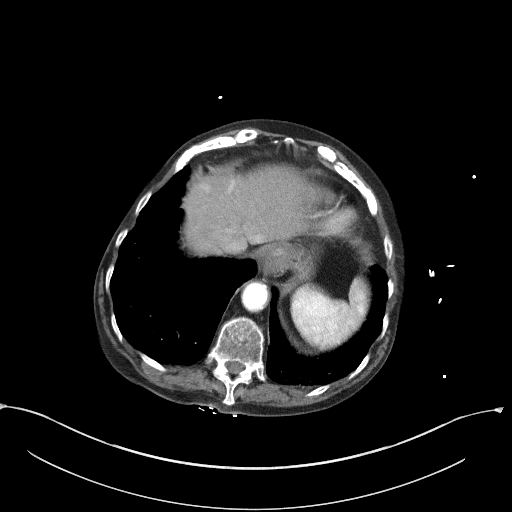

[Series 5: coronal st · coronal · 0.70mm/px · 3 of 90 slices shown]
[im 30/90  soft-tissue]
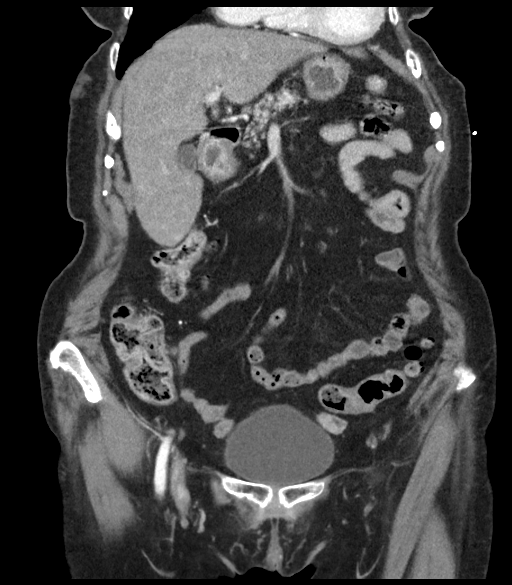
[im 40/90  soft-tissue]
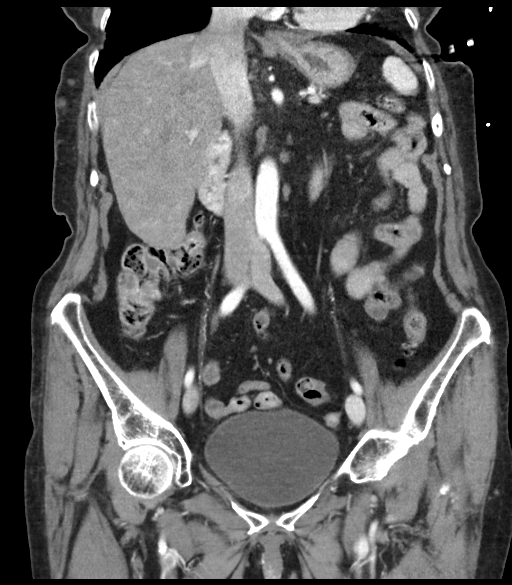
[im 50/90  soft-tissue]
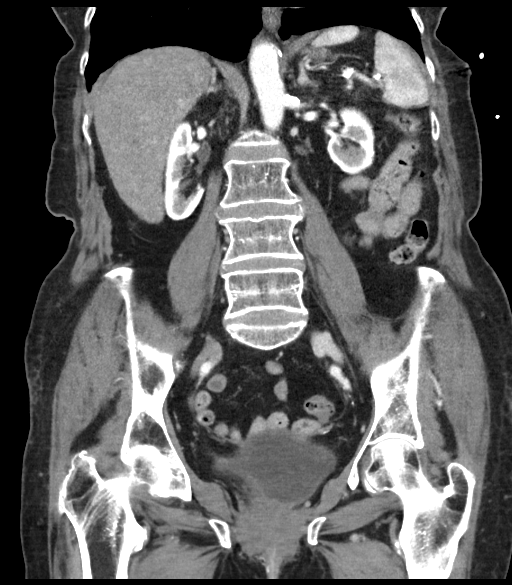

[16 of 46 positions shown; findings below may reference images not displayed]

FINDINGS: Lower chest: Lung bases are clear. No effusion. No consolidative
change.

Hepatobiliary: Mild hepatic steatosis suggested. Nodular hepatic
contour raising the question of liver disease. No pericholecystic
stranding or gross biliary duct dilation.

Pancreas: Signs of pancreatic atrophy with similar appearance to
prior imaging. Low-density lesion in the tail of the pancreas
measuring 8 mm no ductal dilation or sign of inflammation.

Spleen: Spleen normal size and contour.  No focal lesion.

Adrenals/Urinary Tract: Adrenal glands are normal.

Symmetric renal enhancement, no sign of hydronephrosis. No visible
nephrolithiasis. No perivesical stranding. No suspicious renal
lesion.

Stomach/Bowel: Hyperenhancement of the duodenum and gastric antrum
with potential narrowing though without significant distension of
the stomach.

Segmental thickening of the duodenum. Bowel beyond this level is
unremarkable aside from colonic diverticulosis. Appendix not
visualized. No secondary signs to suggest appendiceal inflammation.

Vascular/Lymphatic: Calcified and noncalcified atheromatous plaque
of the abdominal aorta with smooth contour of the IVC. There is no
gastrohepatic or hepatoduodenal ligament lymphadenopathy. No
retroperitoneal or mesenteric lymphadenopathy.

No pelvic sidewall lymphadenopathy.

Reproductive: Post hysterectomy without sign of adnexal mass.

Other: No free air.  No sign of ascites.

Musculoskeletal: No acute bone finding or destructive bone process.
Spinal degenerative changes and osteopenia.
IMPRESSION: Hyperenhancement of the duodenum and gastric antrum with potential
narrowing and wall thickening though without significant distension
of the stomach. Correlate with any clinical signs that would suggest
gastric outlet obstruction and or nausea vomiting. Endoscopy could
be considered on follow-up to exclude underlying lesion or ulcer
disease.

Nodular hepatic contour raising the question of liver disease.

Low-density lesion in the tail of the pancreas measuring 8 mm.
Potentially small cystic pancreatic neoplasm follow-up in 2 years
with pancreatic protocol CT or MRI may be helpful as warranted.

Colonic diverticulosis without evidence of acute diverticulitis.

Aortic Atherosclerosis ([69]-[69]).

## 2021-03-14 MED ORDER — SODIUM CHLORIDE 0.9 % IV SOLN
1.0000 g | Freq: Once | INTRAVENOUS | Status: AC
  Administered 2021-03-14: 1 g via INTRAVENOUS
  Filled 2021-03-14: qty 10

## 2021-03-14 MED ORDER — IOHEXOL 350 MG/ML SOLN
100.0000 mL | Freq: Once | INTRAVENOUS | Status: AC | PRN
  Administered 2021-03-14: 80 mL via INTRAVENOUS

## 2021-03-14 MED ORDER — SODIUM CHLORIDE 0.9 % IV BOLUS
1000.0000 mL | Freq: Once | INTRAVENOUS | Status: AC
  Administered 2021-03-14: 1000 mL via INTRAVENOUS

## 2021-03-14 MED ORDER — CEPHALEXIN 500 MG PO CAPS: 500.0000 mg | ORAL_CAPSULE | Freq: Two times a day (BID) | ORAL | 0 refills | Status: AC

## 2021-03-14 NOTE — Progress Notes (Signed)
   Subjective:    Patient ID: Crystal Browning, female    DOB: [birth year], 87 y.o.   MRN: [id number]  Chief Complaint: b12  HPI Patient went to see neurologist and he sent her back here stating that her b12 is low. I do not see a b12 result in computer. They were told by DR. Doonquay that her b12 level was extremely low and she need to see her PCP as soon as possible. She has had b12 injections in the past and they stopped giving them to her.  Has history of UTI and family wants urine checked today.  Family says she still has vaginal discharge and c/o vaginal itching.   Review of Systems  Constitutional:  Negative for diaphoresis.  Eyes:  Negative for pain.  Respiratory:  Negative for shortness of breath.   Cardiovascular:  Negative for chest pain, palpitations and leg swelling.  Gastrointestinal:  Negative for abdominal pain.  Endocrine: Negative for polydipsia.  Skin:  Negative for rash.  Neurological:  Negative for dizziness, weakness and headaches.  Hematological:  Does not bruise/bleed easily.  Psychiatric/Behavioral:  Positive for confusion.   All other systems reviewed and are negative.     Objective:   Physical Exam Vitals reviewed.  Constitutional:      Appearance: Normal appearance.  Cardiovascular:     Rate and Rhythm: Normal rate and regular rhythm.     Heart sounds: Normal heart sounds.  Pulmonary:     Effort: Pulmonary effort is normal.     Breath sounds: Normal breath sounds.  Neurological:     General: No focal deficit present.     Mental Status: She is alert and oriented to person, place, and time.  Psychiatric:        Mood and Affect: Mood normal.        Behavior: Behavior normal.   BP 123/63   Pulse (!) 59   Temp (!) 97.2 F (36.2 C) (Temporal)   Resp 20   Ht '5\' 1"'$  (1.549 m)   Wt 127 lb (57.6 kg)   SpO2 95%   BMI 24.00 kg/m         Assessment & Plan:   Crystal Browning in today with chief complaint of B12 low (Per neurology/)   1. Hx of non  anemic vitamin B12 deficiency Waiting on lab result Will start b12 injections once labs are back.  2. History of UTI U/a clear  Records reviewed looking for b12 level which could not find. Reviewed most recent hospital records Do not see note for Dr. Feliz Beam office.  The above assessment and management plan was discussed with the patient. The patient verbalized understanding of and has agreed to the management plan. Patient is aware to call the clinic if symptoms persist or worsen. Patient is aware when to return to the clinic for a follow-up visit. Patient educated on when it is appropriate to go to the emergency department.   Crystal Browning Done, FNP

## 2021-03-14 NOTE — Patient Instructions (Signed)
Vitamin B12 Deficiency Vitamin B12 deficiency means that your body does not have enough vitamin B12. The body needs this vitamin: To make red blood cells. To make genes (DNA). To help the nerves work. If you do not have enough vitamin B12 in your body, you can have health problems. What are the causes? Not eating enough foods that contain vitamin B12. Not being able to absorb vitamin B12 from the food that you eat. Certain digestive system diseases. A condition in which the body does not make enough of a certain protein, which results in too few red blood cells (pernicious anemia). Having a surgery in which part of the stomach or small intestine is removed. Taking medicines that make it hard for the body to absorb vitamin B12. These medicines include: Heartburn medicines. Some antibiotic medicines. Other medicines that are used to treat certain conditions. What increases the risk? Being older than age 50. Eating a vegetarian or vegan diet, especially while you are pregnant. Eating a poor diet while you are pregnant. Taking certain medicines. Having alcoholism. What are the signs or symptoms? In some cases, there are no symptoms. If the condition leads to too few blood cells or nerve damage, symptoms can occur, such as: Feeling weak. Feeling tired (fatigued). Not being hungry. Weight loss. A loss of feeling (numbness) or tingling in your hands and feet. Redness and burning of the tongue. Being mixed up (confused) or having memory problems. Sadness (depression). Problems with your senses. This can include color blindness, ringing in the ears, or loss of taste. Watery poop (diarrhea) or trouble pooping (constipation). Trouble walking. If anemia is very bad, symptoms can include: Being short of breath. Being dizzy. Having a very fast heartbeat. How is this treated? Changing the way you eat and drink, such as: Eating more foods that contain vitamin B12. Drinking little or no  alcohol. Getting vitamin B12 shots. Taking vitamin B12 supplements. Your doctor will tell you the dose that is best for you. Follow these instructions at home: Eating and drinking  Eat lots of healthy foods that contain vitamin B12. These include: Meats and poultry, such as beef, pork, chicken, turkey, and organ meats, such as liver. Seafood, such as clams, rainbow trout, salmon, tuna, and haddock. Eggs. Cereal and dairy products that have vitamin B12 added to them. Check the label. The items listed above may not be a complete list of what you can eat and drink. Contact a dietitian for more options. General instructions Get any shots as told by your doctor. Take supplements only as told by your doctor. Do not drink alcohol if your doctor tells you not to. In some cases, you may only be asked to limit alcohol use. Keep all follow-up visits as told by your doctor. This is important. Contact a doctor if: Your symptoms come back. Get help right away if: You have trouble breathing. You have a very fast heartbeat. You have chest pain. You get dizzy. You pass out. Summary Vitamin B12 deficiency means that your body is not getting enough vitamin B12. In some cases, there are no symptoms of this condition. Treatment may include making a change in the way you eat and drink, getting vitamin B12 shots, or taking supplements. Eat lots of healthy foods that contain vitamin B12. This information is not intended to replace advice given to you by your health care provider. Make sure you discuss any questions you have with your health care provider. Document Revised: 03/11/2018 Document Reviewed: 03/11/2018 Elsevier Patient Education    2022 [employer]  

## 2021-03-14 NOTE — ED Triage Notes (Signed)
Family member is concerned she is dehydrated, also has abdominal pain

## 2021-03-14 NOTE — ED Notes (Signed)
Pt returned from CT °

## 2021-03-14 NOTE — Discharge Instructions (Addendum)
The CT scan today showed a lesion of your pancreas.  It is important to get an outpatient pancreas CT scan and/or MRI, which her primary care physician can order.  If you develop worsening, continued, or recurrent abdominal pain, uncontrolled vomiting, fever, chest or back pain, or any other new/concerning symptoms then return to the ER for evaluation.

## 2021-03-14 NOTE — ED Provider Notes (Addendum)
[facility] Provider Note   CSN: [id number] Arrival date & time: 03/14/21  1731  LEVEL 5 CAVEAT - DEMENTIA   History No chief complaint on file.   Crystal Browning is a 87 y.o. female.  HPI 87 year old female presents with generalized weakness and abdominal pain.  History is mostly from family at the bedside.  For the past 3 days or so she has been weak and not eating and drinking as much is normal.  Today she developed abdominal pain and at 1 point seem to be doubled over due to it.  She was also reporting some dysuria.  No abdominal pain currently and no obvious vomiting or fevers.  Was sent over here from [facility] [facility] due to concern for dehydration.  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   Radiation    S/P radiation thrapy -06/2005   TIA (transient ischemic attack)    History of TIA's with no residual effect    Patient Active Problem List   Diagnosis Date Noted   Hallucination 12/09/2020   Pharyngoesophageal dysphagia 01/14/2018   Long term (current) use of anticoagulants 12/14/2016   Atrial fibrillation ([place]) 10/30/2016   Other  fatigue 10/30/2016   Venous insufficiency of both lower extremities 10/30/2016   History of laryngeal cancer 08/20/2016   Xerostomia 08/20/2016   Postmenopausal 07/17/2015   Thyroid activity decreased 07/17/2015   Essential hypertension 07/12/2015   Angina pectoris ([place]) 07/12/2015   Vitamin D deficiency 07/12/2015    Past Surgical History:  Procedure Laterality Date   CARDIOVERSION N/A 11/23/2016   Procedure: CARDIOVERSION;  Surgeon: Pixie Casino, MD;  Location: [facility];  Service: Cardiovascular;  Laterality: N/A;   CATARACT EXTRACTION, BILATERAL     DIRECT LARYNGOSCOPY  03/27/2005   S/P direct laryngoscopy, esophagoscopy and biopsy with Dr. Constance Holster   TOTAL ABDOMINAL HYSTERECTOMY W/ BILATERAL SALPINGOOPHORECTOMY  1986   TUBAL LIGATION       OB History   No obstetric history on file.     Family History  Problem Relation Age of Onset   Stroke Mother    Heart disease Mother 55   Cancer Father 44       Prostate   Alcohol abuse Brother    COPD Brother     Social History   Tobacco Use   Smoking status: Never   Smokeless tobacco: Never  Vaping Use   Vaping Use: Never used  Substance Use Topics   Alcohol use: No   Drug use: No    Home Medications Prior to Admission medications   Medication Sig Start Date End Date Taking? Authorizing  Provider  acetaminophen (TYLENOL) 325 MG tablet Take 2 tablets (650 mg total) by mouth every 6 (six) hours as needed for mild pain, fever or headache. 10/07/19  Yes Emokpae, Courage, MD  cephALEXin (KEFLEX) 500 MG capsule Take 1 capsule (500 mg total) by mouth 2 (two) times daily for 7 days. 03/14/21 03/21/21 Yes Sherwood Gambler, MD  donepezil (ARICEPT) 5 MG tablet Take 5 mg by mouth daily. 03/08/21  Yes [provider]  ELIQUIS 5 MG TABS tablet TAKE 1 TABLET BY MOUTH TWICE A DAY 03/06/21  Yes Stacks, Cletus Gash, MD  escitalopram (LEXAPRO) 10 MG tablet TAKE 1 TABLET BY MOUTH EVERY DAY 02/27/21  Yes [name], MD  famotidine  (PEPCID) 20 MG tablet Take 1 tablet (20 mg total) by mouth every 12 (twelve) hours. 03/30/20  Yes Stacks, Cletus Gash, MD  levothyroxine (SYNTHROID) 50 MCG tablet TAKE 1 TABLET BY MOUTH EVERY DAY 01/02/21  Yes Stacks, Cletus Gash, MD  metoprolol succinate (TOPROL-XL) 100 MG 24 hr tablet TAKE 1 TABLET DAILY WITH OR IMMEDIATELY FOLLOWING A MEAL. Patient taking differently: Take 100 mg by mouth daily. 03/06/21  Yes [name], MD  QUEtiapine (SEROQUEL) 25 MG tablet Take 1 tablet (25 mg total) by mouth at bedtime. Patient taking differently: Take 12.5 mg by mouth at bedtime. 12/22/20  Yes [name], MD  fluconazole (DIFLUCAN) 150 MG tablet Take 150 mg by mouth once. Patient not taking: No sig reported 02/24/21   [provider]  loratadine (CLARITIN) 10 MG tablet Take 10 mg by mouth daily. Patient not taking: No sig reported    [provider]  solifenacin (VESICARE) 5 MG tablet Take 1 tablet (5 mg total) by mouth daily. Patient not taking: No sig reported 10/10/20 10/10/21  [name], MD  TOVIAZ 4 MG TB24 tablet TAKE 1 TABLET BY MOUTH EVERY DAY Patient not taking: No sig reported 03/10/21   [name], MD    Allergies    Amlodipine besylate, Tuberculin tests, Claritin [loratadine], Paroxetine hcl, and Vit d-vit e-safflower oil  Review of Systems   Review of Systems  Unable to perform ROS: Dementia   Physical Exam Updated Vital Signs BP (!) 204/88   Pulse 71   Temp (!) 97.4 F (36.3 C) (Oral)   Resp 11   Ht '5\' 1"'$  (1.549 m)   Wt 57.6 kg   SpO2 98%   BMI 24.00 kg/m   Physical Exam Vitals and nursing note reviewed.  Constitutional:      Appearance: She is well-developed.  HENT:     Head: Normocephalic and atraumatic.     Right Ear: External ear normal.     Left Ear: External ear normal.     Nose: Nose normal.     Mouth/Throat:     Mouth: Mucous membranes are dry.  Eyes:     General:        Right eye: No discharge.        Left eye: No discharge.   Cardiovascular:     Rate and Rhythm: Normal rate and regular rhythm.     Heart sounds: Normal heart sounds.  Pulmonary:     Effort: Pulmonary effort is normal.     Breath sounds: Normal breath sounds.  Abdominal:     General: There is no distension.     Palpations: Abdomen is soft.     Tenderness: There is no abdominal tenderness.  Skin:    General: Skin is warm and dry.  Neurological:     Mental Status:  She is alert.  Psychiatric:        Mood and Affect: Mood is not anxious.    ED Results / Procedures / Treatments   Labs (all labs ordered are listed, but only abnormal results are displayed) Labs Reviewed  URINALYSIS, ROUTINE W REFLEX MICROSCOPIC - Abnormal; Notable for the following components:      Result Value   APPearance HAZY (*)    Leukocytes,Ua MODERATE (*)    Bacteria, UA RARE (*)    All other components within normal limits  URINE CULTURE  COMPREHENSIVE METABOLIC PANEL  LIPASE, BLOOD  CBC WITH DIFFERENTIAL/PLATELET  TROPONIN I (HIGH SENSITIVITY)    EKG EKG Interpretation  Date/Time:  Tuesday March 14 2021 20:04:27 EDT Ventricular Rate:  69 PR Interval:    QRS Duration: 117 QT Interval:  455 QTC Calculation: 488 R Axis:   -51 Text Interpretation: Atrial fibrillation Incomplete RBBB and LAFB Probable left ventricular hypertrophy Confirmed by Sherwood Gambler [id number]) on 03/14/2021 8:36:36 PM  Radiology CT ABDOMEN PELVIS W CONTRAST  Result Date: 03/14/2021 CLINICAL DATA:  Bowel obstruction suspected in a 87 year old female. EXAM: CT ABDOMEN AND PELVIS WITH CONTRAST TECHNIQUE: Multidetector CT imaging of the abdomen and pelvis was performed using the standard protocol following bolus administration of intravenous contrast. CONTRAST:  60m OMNIPAQUE IOHEXOL 350 MG/ML SOLN COMPARISON:  November of 2017 FINDINGS: Lower chest: Lung bases are clear. No effusion. No consolidative change. Hepatobiliary: Mild hepatic steatosis suggested. Nodular hepatic contour raising  the question of liver disease. No pericholecystic stranding or gross biliary duct dilation. Pancreas: Signs of pancreatic atrophy with similar appearance to prior imaging. Low-density lesion in the tail of the pancreas measuring 8 mm no ductal dilation or sign of inflammation. Spleen: Spleen normal size and contour.  No focal lesion. Adrenals/Urinary Tract: Adrenal glands are normal. Symmetric renal enhancement, no sign of hydronephrosis. No visible nephrolithiasis. No perivesical stranding. No suspicious renal lesion. Stomach/Bowel: Hyperenhancement of the duodenum and gastric antrum with potential narrowing though without significant distension of the stomach. Segmental thickening of the duodenum. Bowel beyond this level is unremarkable aside from colonic diverticulosis. Appendix not visualized. No secondary signs to suggest appendiceal inflammation. Vascular/Lymphatic: Calcified and noncalcified atheromatous plaque of the abdominal aorta with smooth contour of the IVC. There is no gastrohepatic or hepatoduodenal ligament lymphadenopathy. No retroperitoneal or mesenteric lymphadenopathy. No pelvic sidewall lymphadenopathy. Reproductive: Post hysterectomy without sign of adnexal mass. Other: No free air.  No sign of ascites. Musculoskeletal: No acute bone finding or destructive bone process. Spinal degenerative changes and osteopenia. IMPRESSION: Hyperenhancement of the duodenum and gastric antrum with potential narrowing and wall thickening though without significant distension of the stomach. Correlate with any clinical signs that would suggest gastric outlet obstruction and or nausea vomiting. Endoscopy could be considered on follow-up to exclude underlying lesion or ulcer disease. Nodular hepatic contour raising the question of liver disease. Low-density lesion in the tail of the pancreas measuring 8 mm. Potentially small cystic pancreatic neoplasm follow-up in 2 years with pancreatic protocol CT or MRI may be  helpful as warranted. Colonic diverticulosis without evidence of acute diverticulitis. Aortic Atherosclerosis (ICD10-I70.0). Electronically Signed   By: GZetta BillsM.D.   On: 03/14/2021 21:22    Procedures Procedures   Medications Ordered in ED Medications  sodium chloride 0.9 % bolus 1,000 mL (0 mLs Intravenous Stopped 03/14/21 2046)  iohexol (OMNIPAQUE) 350 MG/ML injection 100 mL (80 mLs Intravenous Contrast Given 03/14/21 2059)  cefTRIAXone (ROCEPHIN) 1 g in sodium chloride 0.9 %  100 mL IVPB (1 g Intravenous New Bag/Given 03/14/21 2229)    ED Course  I have reviewed the triage vital signs and the nursing notes.  Pertinent labs & imaging results that were available during my care of the patient were reviewed by me and considered in my medical decision making (see chart for details).    MDM Rules/Calculators/A&P                           Patient likely had transient symptoms from acute UTI.  Labs are overall unremarkable including no leukocytosis.  She has no abdominal tenderness on exam but given age a CT was obtained.  Potential abnormality of the duodenum/gastric antrum but she is not having any vomiting and has no further abdominal pain.  Does not really sound like she is having gastric outlet obstruction.  She can follow-up as an outpatient but I highly doubt this is an acute emergent condition.  Family was made aware of the low-density lesion on the pancreas and will need outpatient follow-up.  She is noted to be asymptomatically hypertensive but is unclear what her baseline is at home as family does not check it.  I do not think emergent blood pressure lowering would be beneficial.  Will discharge.Tolerated PO here Final Clinical Impression(s) / ED Diagnoses Final diagnoses:  Acute urinary tract infection  Lesion of pancreas    Rx / DC Orders ED Discharge Orders          Ordered    cephALEXin (KEFLEX) 500 MG capsule  2 times daily        03/14/21 2219              Sherwood Gambler, MD 03/14/21 [id number]    Sherwood Gambler, MD 03/14/21 [id number]

## 2021-03-15 ENCOUNTER — Telehealth: Payer: Self-pay | Admitting: Family Medicine

## 2021-03-15 LAB — VITAMIN B12: Vitamin B-12: 231 pg/mL — ABNORMAL LOW (ref 232–1245)

## 2021-03-15 NOTE — Telephone Encounter (Signed)
Daughter in law calling--Pt had apt yesterday--then went [facility] because daughter wanted further testing. A ct scan was done--a spot on pancreatics was found. ED Dr recommended an MRI for further testing.   REFERRAL REQUEST Telephone Note  Have you been seen at our office for this problem? 03/14/2021 (Advise that they may need an appointment with their PCP before a referral can be done)  Reason for Referral: MRI Referral discussed with patient: NO Best contact number of patient for referral team:   [phone] Has patient been seen by a specialist for this issue before: NO Patient provider preference for referral:  Patient location preference for referral: [name]   Patient notified that referrals can take up to a week or longer to process. If they haven't heard anything within a week they should call back and speak with the referral department.

## 2021-03-16 ENCOUNTER — Telehealth: Payer: Self-pay | Admitting: Family Medicine

## 2021-03-16 LAB — URINE CULTURE

## 2021-03-16 NOTE — Telephone Encounter (Signed)
Spoke with daughter, ER follow up scheduled with Dr. Livia Snellen on 03/23/21 at 4:25 pm.

## 2021-03-16 NOTE — Telephone Encounter (Signed)
Pt. Needs to be seen for this. Thanks, WS 

## 2021-03-17 NOTE — Telephone Encounter (Signed)
Family aware by detailed VM - to keep appt on 9/8 with Stacks.

## 2021-03-17 NOTE — Telephone Encounter (Signed)
Pt was seen here 8/30 by MMM Went to ER - notes are in epic - CT was done - in epic - needs MRI   Can this be ordered, rather than coming for another OV?

## 2021-03-17 NOTE — Telephone Encounter (Signed)
It looks like she has an appointment next week with her PCP. I think we need to keep this appointment as their were multiple findings on her CT scan that need to be discussed.

## 2021-03-21 ENCOUNTER — Other Ambulatory Visit: Payer: Self-pay | Admitting: Family Medicine

## 2021-03-22 MED ORDER — POLYETHYLENE GLYCOL 3350 17 GM/SCOOP PO POWD: ORAL | 3 refills | Status: DC

## 2021-03-22 NOTE — Telephone Encounter (Signed)
Refill failed. resent °

## 2021-03-22 NOTE — Addendum Note (Signed)
Addended by: Antonietta Barcelona D on: 03/22/2021 04:23 PM   Modules accepted: Orders

## 2021-03-23 ENCOUNTER — Ambulatory Visit (INDEPENDENT_AMBULATORY_CARE_PROVIDER_SITE_OTHER): Payer: Medicare Other | Admitting: Family Medicine

## 2021-03-23 ENCOUNTER — Other Ambulatory Visit: Payer: Self-pay

## 2021-03-23 VITALS — BP 147/72 | HR 59 | Temp 97.7°F | Ht 61.0 in | Wt 125.0 lb

## 2021-03-23 DIAGNOSIS — C787 Secondary malignant neoplasm of liver and intrahepatic bile duct: Secondary | ICD-10-CM | POA: Diagnosis not present

## 2021-03-23 DIAGNOSIS — R932 Abnormal findings on diagnostic imaging of liver and biliary tract: Secondary | ICD-10-CM | POA: Diagnosis not present

## 2021-03-23 DIAGNOSIS — R1084 Generalized abdominal pain: Secondary | ICD-10-CM | POA: Diagnosis not present

## 2021-03-23 DIAGNOSIS — E538 Deficiency of other specified B group vitamins: Secondary | ICD-10-CM

## 2021-03-23 NOTE — Progress Notes (Signed)
Subjective:  Patient ID: Crystal Browning, female    DOB: 04/01/1934  Age: 87 y.o. MRN: [id number]  CC: Follow-up   HPI Crystal Browning presents for E.D. follow up. Had belly pain on 8/30 that doubled her over. Still bothering her sometimes. Seems less severe though. CT report recommended that she have MRI.  Depression screen [facility] 2/9 03/23/2021 01/19/2021 01/18/2021  Decreased Interest 0 0 0  Down, Depressed, Hopeless 0 0 0  PHQ - 2 Score 0 0 0  Altered sleeping - - 0  Tired, decreased energy - - 1  Change in appetite - - 1  Feeling bad or failure about yourself  - - 0  Trouble concentrating - - 0  Moving slowly or fidgety/restless - - 0  Suicidal thoughts - - 0  PHQ-9 Score - - 2  Difficult doing work/chores - - Not difficult at all  Some recent data might be hidden    History Crystal Browning has a past medical history of Arthritis, Atrial fibrillation ([place]) (10/30/2016), Bilateral lower extremity edema (10/30/2016), Cancer ([place]) (03/28/2011), CAP (community acquired pneumonia) (10/04/2019), Dementia ([place]), Depression, Diverticulosis (02/2005), Esophageal stricture (03/03/2018), Generalized abdominal pain (07/17/2015), History of chemotherapy, History of external beam radiation therapy (08/20/2016), Hypertension, Hypothyroid, Osteoporosis, 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   She has a past surgical history that includes Direct laryngoscopy (03/27/2005); Tubal ligation; Total abdominal hysterectomy w/ bilateral salpingoophorectomy (1986); Cataract extraction, bilateral; and Cardioversion (N/A, 11/23/2016).   Her family history includes Alcohol abuse in her brother; COPD in her brother; Cancer (age of onset: 30) in her father; Heart disease (age of onset: 32) in her mother; Stroke in her mother.She reports that she has never smoked. She has never used smokeless tobacco. She reports that she does not drink alcohol and does not use  drugs.    ROS Review of Systems  Constitutional: Negative.   HENT: Negative.    Eyes:  Negative for visual disturbance.  Respiratory:  Negative for shortness of breath.   Cardiovascular:  Negative for chest pain.  Gastrointestinal:  Positive for abdominal distention, abdominal pain, constipation and nausea. Negative for blood in stool, rectal pain and vomiting.  Musculoskeletal:  Negative for arthralgias.   Objective:  BP (!) 147/72   Pulse (!) 59   Temp 97.7 F (36.5 C) (Temporal)   Ht '5\' 1"'$  (1.549 m)   Wt 125 lb (56.7 kg)   SpO2 93%   BMI 23.62 kg/m   BP Readings from Last 3 Encounters:  03/23/21 (!) 147/72  03/14/21 (!) 204/88  03/14/21 123/63    Wt Readings from Last 3 Encounters:  03/23/21 125 lb (56.7 kg)  03/14/21 127 lb (57.6 kg)  03/14/21 127 lb (57.6 kg)     Physical Exam Constitutional:      General: She is not in acute distress.    Appearance: She is well-developed.  Cardiovascular:     Rate and Rhythm: Normal rate and regular rhythm.  Pulmonary:     Breath sounds: Normal breath sounds.  Abdominal:     Palpations: Abdomen is soft.     Tenderness: There is abdominal tenderness (mild and diffuse).  Musculoskeletal:        General: Normal range of motion.  Skin:    General: Skin is warm and dry.  Neurological:     Mental Status: She is alert and oriented to person, place, and time.      Assessment & Plan:  Crystal Browning was seen today for follow-up.  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 We administered cyanocobalamin.  Allergies as of 03/23/2021       Reactions   Amlodipine Besylate Hives   Hives and confusion.   Tuberculin Tests Swelling   Claritin [loratadine] Rash   Pt's daughter states she is allergic to claritin   Paroxetine Hcl Rash   Vit D-vit E-safflower Oil         Medication List        Accurate as of March 23, 2021 11:59 PM. If you have any questions, ask your nurse or doctor.          acetaminophen 325 MG tablet Commonly known as: TYLENOL Take 2 tablets (650 mg total) by mouth every 6 (six) hours as needed for mild pain, fever or headache.   donepezil 5 MG tablet Commonly known as: ARICEPT Take 5 mg by mouth daily.   Eliquis 5 MG Tabs tablet Generic drug: apixaban TAKE 1 TABLET BY MOUTH TWICE A DAY   escitalopram 10 MG tablet Commonly known as: LEXAPRO TAKE 1 TABLET BY MOUTH EVERY DAY   famotidine 20 MG tablet Commonly known as: PEPCID Take 1 tablet (20 mg total) by mouth every 12 (twelve) hours.   fluconazole 150 MG tablet Commonly known as: DIFLUCAN Take 150 mg by mouth once.   levothyroxine 50 MCG tablet Commonly known as: SYNTHROID TAKE 1 TABLET BY MOUTH EVERY DAY   loratadine 10 MG tablet Commonly known as: CLARITIN Take 10 mg by mouth daily.   metoprolol succinate 100 MG 24 hr tablet Commonly known as: TOPROL-XL TAKE 1 TABLET DAILY WITH OR IMMEDIATELY FOLLOWING A MEAL. What changed: See the new instructions.   polyethylene glycol powder 17 GM/SCOOP powder Commonly known as: [employer] Purelax TAKE 17 GRAMS (1 CAPFUL) MIXED IN LIQUID AS DIRECTED 2 (TWO) TIMES DAILY. FOR CONSTIPATION   QUEtiapine 25 MG tablet Commonly known as: SEROQUEL Take 1 tablet (25 mg total) by mouth at bedtime. What changed: how much to take   solifenacin 5 MG tablet Commonly known as: VESICARE Take 1 tablet (5 mg total) by mouth daily.   Toviaz 4 MG Tb24 tablet Generic drug: fesoterodine TAKE 1 TABLET BY MOUTH EVERY DAY          Follow-up: Return in about 1 month (around 04/22/2021), or if symptoms worsen or fail to improve.  [name], M.D.

## 2021-03-24 MED ORDER — CYANOCOBALAMIN 1000 MCG/ML IJ SOLN
1000.0000 ug | Freq: Once | INTRAMUSCULAR | Status: AC
  Administered 2021-03-23: 1000 ug via INTRAMUSCULAR

## 2021-03-27 ENCOUNTER — Encounter: Payer: Self-pay | Admitting: Family Medicine

## 2021-03-30 ENCOUNTER — Other Ambulatory Visit: Payer: Self-pay | Admitting: Family Medicine

## 2021-03-30 DIAGNOSIS — I10 Essential (primary) hypertension: Secondary | ICD-10-CM

## 2021-03-30 DIAGNOSIS — I4891 Unspecified atrial fibrillation: Secondary | ICD-10-CM

## 2021-04-04 ENCOUNTER — Other Ambulatory Visit: Payer: Self-pay | Admitting: Family Medicine

## 2021-04-04 DIAGNOSIS — I4891 Unspecified atrial fibrillation: Secondary | ICD-10-CM

## 2021-04-05 ENCOUNTER — Ambulatory Visit (HOSPITAL_COMMUNITY)
Admission: RE | Admit: 2021-04-05 | Discharge: 2021-04-05 | Disposition: A | Discharge status: Home / Self Care | Payer: Medicare Other | Source: Ambulatory Visit | Attending: Family Medicine | Admitting: Family Medicine

## 2021-04-05 ENCOUNTER — Other Ambulatory Visit: Payer: Self-pay | Admitting: Family Medicine

## 2021-04-05 ENCOUNTER — Other Ambulatory Visit: Payer: Self-pay

## 2021-04-05 DIAGNOSIS — K760 Fatty (change of) liver, not elsewhere classified: Secondary | ICD-10-CM | POA: Diagnosis not present

## 2021-04-05 DIAGNOSIS — K862 Cyst of pancreas: Secondary | ICD-10-CM | POA: Diagnosis not present

## 2021-04-05 DIAGNOSIS — C787 Secondary malignant neoplasm of liver and intrahepatic bile duct: Secondary | ICD-10-CM

## 2021-04-05 DIAGNOSIS — K7460 Unspecified cirrhosis of liver: Secondary | ICD-10-CM | POA: Diagnosis not present

## 2021-04-05 IMAGING — MR MR ABDOMEN WO/W CM
20 of 23 series · 41 of 48 positions shown · IV contrast (gadavist)
Comparison: No prior abdominal MRI. CT of the abdomen and pelvis
[DATE].

CLINICAL DATA: 87-year-old female with suspected metastatic disease
to the liver. Pancreatic lesion noted on prior CT examination.

EXAM:
MRI ABDOMEN WITHOUT AND WITH CONTRAST
TECHNIQUE: Multiplanar multisequence MR imaging of the abdomen was performed
both before and after the administration of intravenous contrast.
CONTRAST:  5mL GADAVIST GADOBUTROL 1 MMOL/ML IV SOLN

[Series 3: cor haste · coronal · 6.0mm · 1.25mm/px · 1 of 32 slices shown]
[im 1/32]
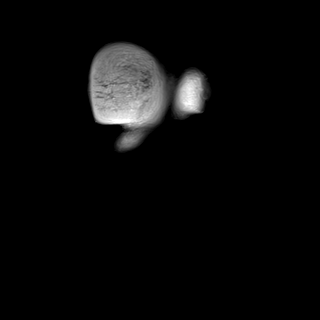

[Series 4: ax haste · axial · 6.0mm · 1.06mm/px · 1 of 30 slices shown]
[im 1/30]
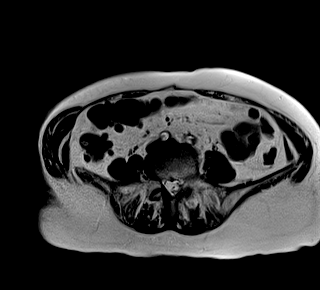

[Series 5: T2 fat-sat · axial · 6.0mm · 1.19mm/px · 1 of 30 slices shown]
[im 1/30]
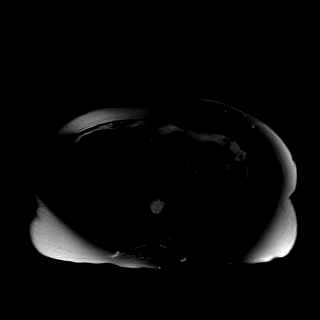

[Series 6: DWI · axial · 6.0mm · 1.27mm/px · 1 of 40 slices shown (1 of 4)]
[im 1/40]
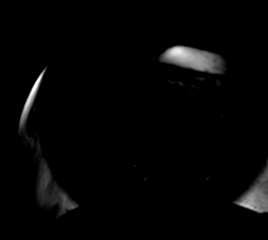

[Series 6: DWI · axial · 6.0mm · 1.27mm/px · 1 of 40 slices shown (2 of 4)]
[im 1/40]
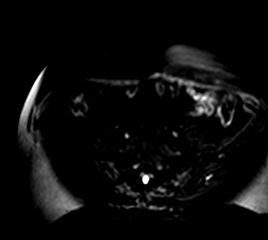

[Series 6: DWI · axial · 6.0mm · 1.27mm/px · 1 of 40 slices shown (3 of 4)]
[im 1/40]
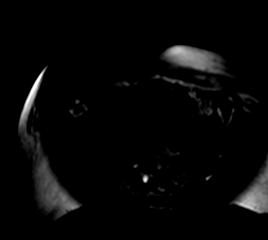

[Series 7: DWI · axial · 6.0mm · 1.27mm/px · 1 of 40 slices shown (4 of 4)]
[im 1/40]
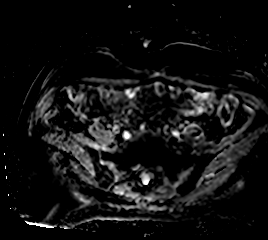

[Series 11: bSSFP · axial · 6.0mm · 0.66mm/px · 1 of 38 slices shown]
[im 1/38]
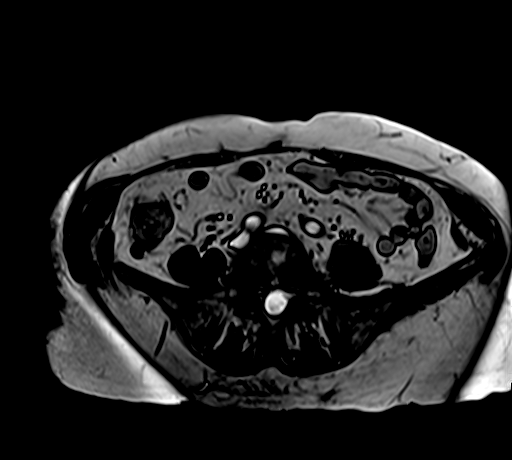

[Series 12: ax in and · axial · 3.0mm · 1.19mm/px · z∈[-79,+134]mm · 2 of 72 slices shown (1 of 2)]
[im 1/72]
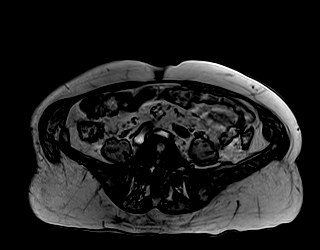
[im 72/72]
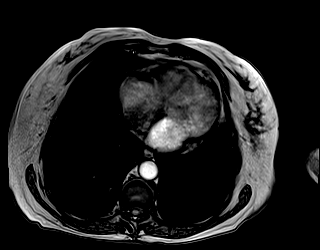

[Series 13: ax in and · axial · 3.0mm · 1.19mm/px · z∈[-79,+134]mm · 2 of 72 slices shown (2 of 2)]
[im 1/72]
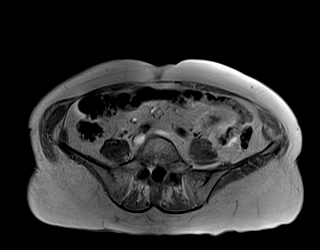
[im 72/72]
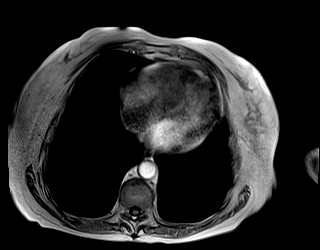

[Series 14: T1 dynamic · axial · non-contrast · 3.0mm · 1.19mm/px · z∈[-67,+146]mm · 2 of 72 slices shown (1 of 4)]
[im 1/72]
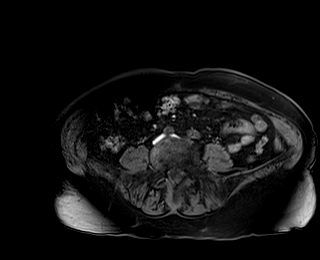
[im 72/72]
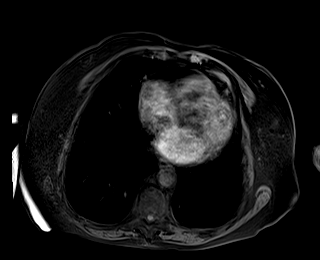

[Series 16: T1 dynamic post-contrast · axial · 3.0mm · 1.19mm/px · z∈[-67,+146]mm · 3 of 72 slices shown (1 of 6)]
[im 1/72]
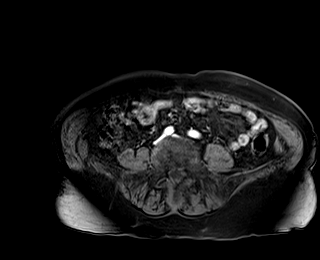
[im 36/72]
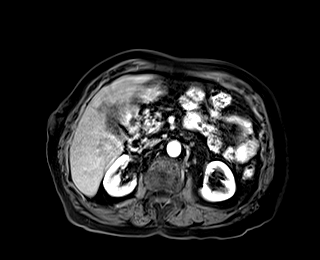
[im 72/72]
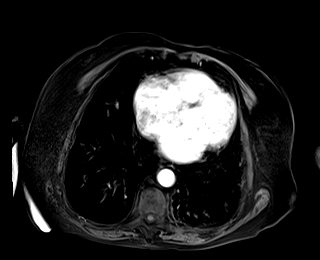

[Series 17: T1 dynamic · axial · 3.0mm · 1.19mm/px · z∈[-67,+146]mm · 3 of 72 slices shown (2 of 4)]
[im 1/72]
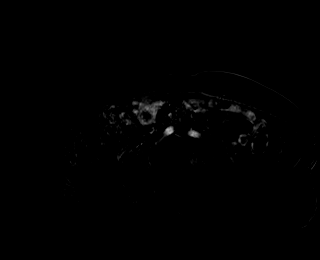
[im 36/72]
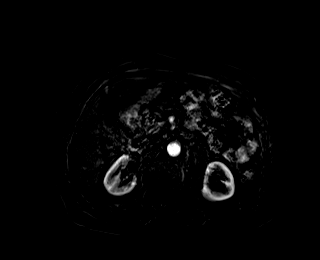
[im 72/72]
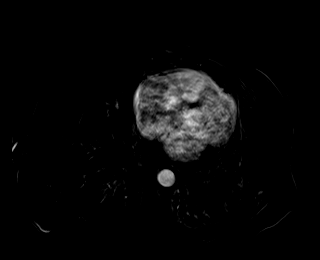

[Series 18: T1 dynamic post-contrast · axial · 3.0mm · 1.19mm/px · z∈[-67,+146]mm · 3 of 72 slices shown (2 of 6)]
[im 1/72]
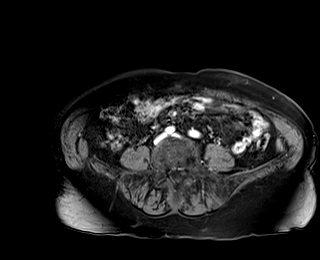
[im 36/72]
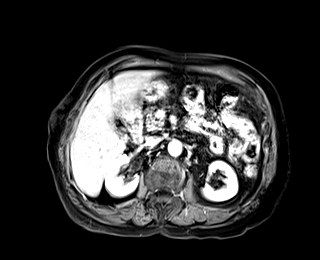
[im 72/72]
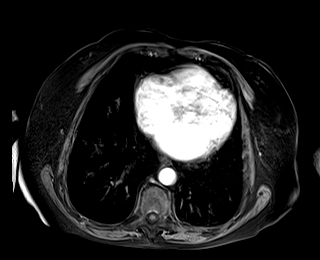

[Series 19: T1 dynamic · axial · 3.0mm · 1.19mm/px · z∈[-67,+146]mm · 3 of 72 slices shown (3 of 4)]
[im 1/72]
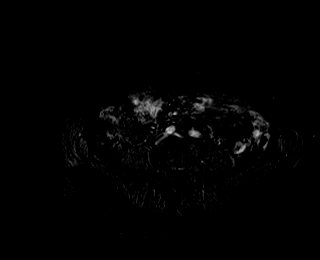
[im 36/72]
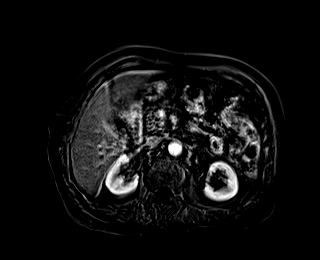
[im 72/72]
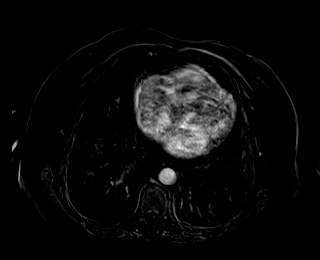

[Series 20: T1 dynamic post-contrast · axial · 3.0mm · 1.19mm/px · z∈[-67,+146]mm · 3 of 72 slices shown (3 of 6)]
[im 1/72]
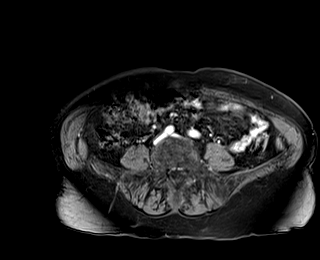
[im 36/72]
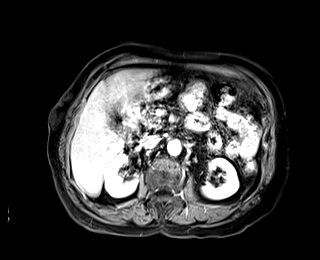
[im 72/72]
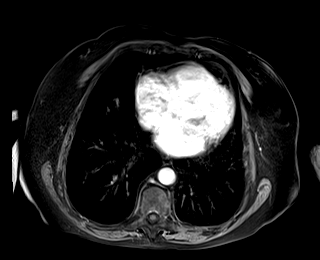

[Series 21: T1 dynamic · axial · 3.0mm · 1.19mm/px · z∈[-67,+146]mm · 3 of 72 slices shown (4 of 4)]
[im 1/72]
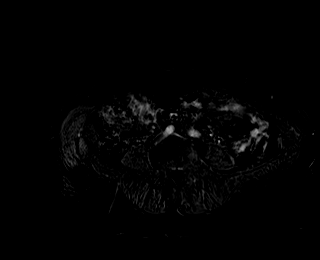
[im 36/72]
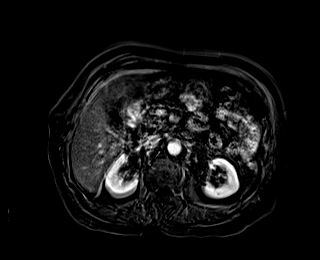
[im 72/72]
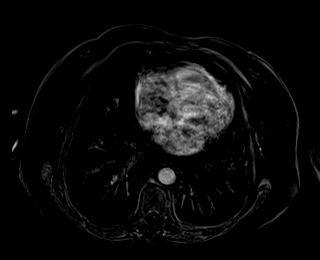

[Series 22: T1 dynamic post-contrast · coronal · 3.0mm · 1.19mm/px · 3 of 80 slices shown (4 of 6)]
[im 1/80]
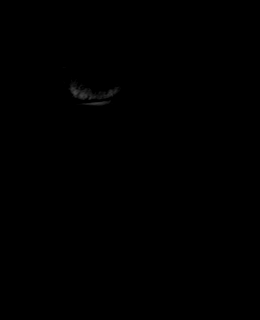
[im 40/80]
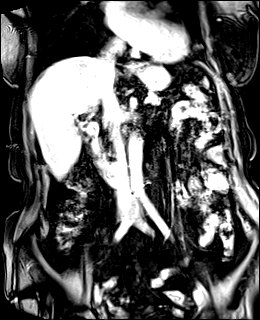
[im 80/80]
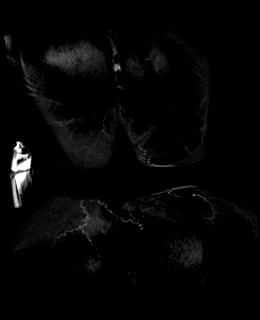

[Series 23: T1 dynamic post-contrast · axial · 3.0mm · 1.19mm/px · z∈[-67,+146]mm · 3 of 72 slices shown (5 of 6)]
[im 1/72]
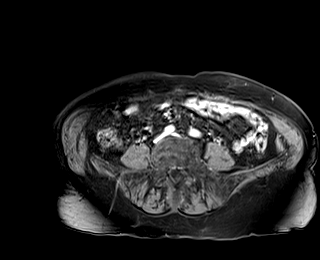
[im 36/72]
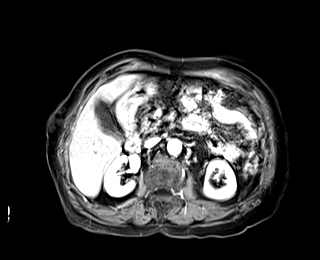
[im 72/72]
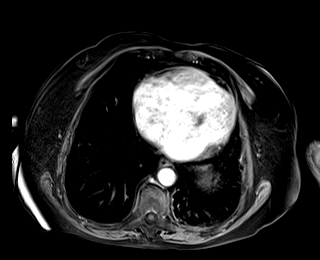

[Series 24: T1 dynamic post-contrast · axial · 3.0mm · 1.19mm/px · z∈[-67,+146]mm · 3 of 72 slices shown (6 of 6)]
[im 1/72]
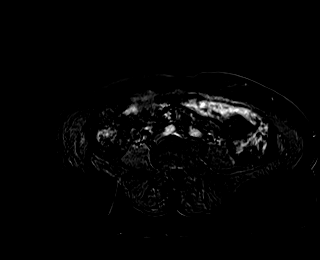
[im 36/72]
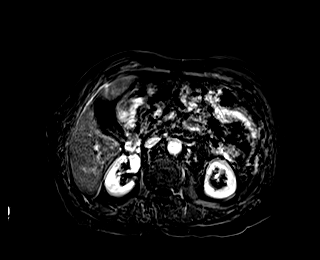
[im 72/72]
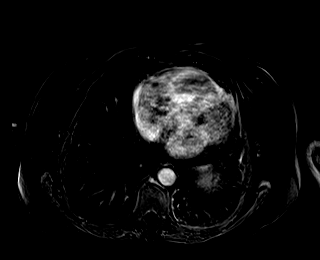

[41 of 48 positions shown; findings below may reference images not displayed]

FINDINGS: Comment: Today's study is limited by extensive patient motion.

Lower chest: Unremarkable.

Hepatobiliary: Mild diffuse loss of signal intensity throughout the
hepatic parenchyma on out of phase dual echo images, indicative of a
background of hepatic steatosis. Hepatic contour is mildly nodular,
suggesting early changes of cirrhosis. No suspicious cystic or solid
hepatic lesions. No intra or extrahepatic biliary ductal dilatation
noted on MRCP images. Common bile duct measures 3 mm in the porta
hepatis. No filling defects in the common bile duct to suggest
choledocholithiasis. Gallbladder is normal in appearance.

Pancreas: Diffuse atrophy throughout the pancreatic parenchyma. In
the inferior aspect of the pancreatic head (axial image 20 of series
4) there is a T1 hypointense, T2 hyperintense, 6 mm nonenhancing
lesion which has a benign appearance. No other definite pancreatic
lesions are confidently identified. Specifically, no cystic lesion
in the tail of the pancreas is readily apparent on today's motion
limited examination. No peripancreatic fluid collections or
inflammatory changes.

Spleen:  Unremarkable.

Adrenals/Urinary Tract: Bilateral kidneys and adrenal glands are
normal in appearance. No hydroureteronephrosis in the visualized
portions of the abdomen.

Stomach/Bowel: Visualized portions are unremarkable.

Vascular/Lymphatic: No aneurysm identified in the visualized
abdominal vasculature. No lymphadenopathy noted in the abdomen.

Other: No significant volume of ascites noted in the visualized
portions of the peritoneal cavity.

Musculoskeletal: No aggressive appearing osseous lesions are noted
in the visualized portions of the skeleton.
IMPRESSION: 1. No findings to suggest metastatic disease to the liver. There is
hepatic steatosis with evidence of early hepatic cirrhosis.
2. The only pancreatic lesion identified on today's examination is a
6 mm cystic lesion in the inferior aspect of the pancreatic head
which has a benign appearance, likely to represent a small side
branch IPMN (intraductal papillary mucinous neoplasm), or
potentially a tiny pancreatic pseudocyst. This is statistically
likely benign, but repeat abdominal MRI with and without IV
gadolinium with MRCP is recommended in 2 years to ensure the
stability of this finding.
3. Pancreatic atrophy.

## 2021-04-05 MED ORDER — GADOBUTROL 1 MMOL/ML IV SOLN
5.0000 mL | Freq: Once | INTRAVENOUS | Status: AC | PRN
  Administered 2021-04-05: 5 mL via INTRAVENOUS

## 2021-04-10 ENCOUNTER — Other Ambulatory Visit: Payer: Self-pay | Admitting: Family Medicine

## 2021-04-18 ENCOUNTER — Ambulatory Visit: Payer: Medicare Other | Admitting: Nurse Practitioner

## 2021-04-29 ENCOUNTER — Emergency Department (HOSPITAL_COMMUNITY)
Admission: EM | Admit: 2021-04-29 | Discharge: 2021-04-29 | Disposition: A | Discharge status: Home / Self Care | Payer: Medicare Other | Attending: Emergency Medicine | Admitting: Emergency Medicine

## 2021-04-29 ENCOUNTER — Other Ambulatory Visit: Payer: Self-pay | Admitting: Family Medicine

## 2021-04-29 ENCOUNTER — Emergency Department (HOSPITAL_COMMUNITY): Payer: Medicare Other

## 2021-04-29 ENCOUNTER — Encounter (HOSPITAL_COMMUNITY): Payer: Self-pay | Admitting: *Deleted

## 2021-04-29 ENCOUNTER — Other Ambulatory Visit: Payer: Self-pay

## 2021-04-29 DIAGNOSIS — F0390 Unspecified dementia without behavioral disturbance: Secondary | ICD-10-CM | POA: Insufficient documentation

## 2021-04-29 DIAGNOSIS — I10 Essential (primary) hypertension: Secondary | ICD-10-CM | POA: Diagnosis not present

## 2021-04-29 DIAGNOSIS — E039 Hypothyroidism, unspecified: Secondary | ICD-10-CM | POA: Insufficient documentation

## 2021-04-29 DIAGNOSIS — Z79899 Other long term (current) drug therapy: Secondary | ICD-10-CM | POA: Diagnosis not present

## 2021-04-29 DIAGNOSIS — I4891 Unspecified atrial fibrillation: Secondary | ICD-10-CM

## 2021-04-29 DIAGNOSIS — R443 Hallucinations, unspecified: Secondary | ICD-10-CM | POA: Diagnosis not present

## 2021-04-29 DIAGNOSIS — Z7901 Long term (current) use of anticoagulants: Secondary | ICD-10-CM | POA: Diagnosis not present

## 2021-04-29 DIAGNOSIS — Z8521 Personal history of malignant neoplasm of larynx: Secondary | ICD-10-CM | POA: Diagnosis not present

## 2021-04-29 DIAGNOSIS — R4182 Altered mental status, unspecified: Secondary | ICD-10-CM | POA: Diagnosis not present

## 2021-04-29 DIAGNOSIS — R441 Visual hallucinations: Secondary | ICD-10-CM | POA: Diagnosis not present

## 2021-04-29 LAB — RAPID URINE DRUG SCREEN, HOSP PERFORMED
Amphetamines: NOT DETECTED
Barbiturates: NOT DETECTED
Benzodiazepines: NOT DETECTED
Cocaine: NOT DETECTED
Opiates: NOT DETECTED
Tetrahydrocannabinol: NOT DETECTED

## 2021-04-29 LAB — CBC WITH DIFFERENTIAL/PLATELET
Abs Immature Granulocytes: 0.02 10*3/uL (ref 0.00–0.07)
Basophils Absolute: 0 10*3/uL (ref 0.0–0.1)
Basophils Relative: 0 %
Eosinophils Absolute: 0.3 10*3/uL (ref 0.0–0.5)
Eosinophils Relative: 5 %
HCT: 41.6 % (ref 36.0–46.0)
Hemoglobin: 13.6 g/dL (ref 12.0–15.0)
Immature Granulocytes: 0 %
Lymphocytes Relative: 28 %
Lymphs Abs: 1.6 10*3/uL (ref 0.7–4.0)
MCH: 32.5 pg (ref 26.0–34.0)
MCHC: 32.7 g/dL (ref 30.0–36.0)
MCV: 99.5 fL (ref 80.0–100.0)
Monocytes Absolute: 0.6 10*3/uL (ref 0.1–1.0)
Monocytes Relative: 10 %
Neutro Abs: 3.2 10*3/uL (ref 1.7–7.7)
Neutrophils Relative %: 57 %
Platelets: 198 10*3/uL (ref 150–400)
RBC: 4.18 MIL/uL (ref 3.87–5.11)
RDW: 13.5 % (ref 11.5–15.5)
WBC: 5.7 10*3/uL (ref 4.0–10.5)
nRBC: 0 % (ref 0.0–0.2)

## 2021-04-29 LAB — COMPREHENSIVE METABOLIC PANEL
ALT: 20 U/L (ref 0–44)
AST: 38 U/L (ref 15–41)
Albumin: 4.2 g/dL (ref 3.5–5.0)
Alkaline Phosphatase: 77 U/L (ref 38–126)
Anion gap: 7 (ref 5–15)
BUN: 14 mg/dL (ref 8–23)
CO2: 24 mmol/L (ref 22–32)
Calcium: 8.8 mg/dL — ABNORMAL LOW (ref 8.9–10.3)
Chloride: 106 mmol/L (ref 98–111)
Creatinine, Ser: 0.83 mg/dL (ref 0.44–1.00)
GFR, Estimated: >60 mL/min (ref >60)
Glucose, Bld: 97 mg/dL (ref 70–99)
Potassium: 4.2 mmol/L (ref 3.5–5.1)
Sodium: 137 mmol/L (ref 135–145)
Total Bilirubin: 0.4 mg/dL (ref 0.3–1.2)
Total Protein: 7.5 g/dL (ref 6.5–8.1)

## 2021-04-29 LAB — ETHANOL: Alcohol, Ethyl (B): <10 mg/dL (ref <10)

## 2021-04-29 LAB — URINALYSIS, ROUTINE W REFLEX MICROSCOPIC
Bilirubin Urine: NEGATIVE
Glucose, UA: NEGATIVE mg/dL
Hgb urine dipstick: NEGATIVE
Ketones, ur: NEGATIVE mg/dL
Leukocytes,Ua: NEGATIVE
Nitrite: NEGATIVE
Protein, ur: NEGATIVE mg/dL
Specific Gravity, Urine: 1.005 (ref 1.005–1.030)
pH: 7 (ref 5.0–8.0)

## 2021-04-29 LAB — CBG MONITORING, ED: Glucose-Capillary: 74 mg/dL (ref 70–99)

## 2021-04-29 MED ORDER — SODIUM CHLORIDE 0.9 % IV BOLUS
500.0000 mL | Freq: Once | INTRAVENOUS | Status: AC
  Administered 2021-04-29: 500 mL via INTRAVENOUS

## 2021-04-29 NOTE — Discharge Instructions (Addendum)
If you develop fever, new or worsening headache, new or worsening hallucinations, vomiting, or any other new/concerning symptoms then return to the ER for evaluation.

## 2021-04-29 NOTE — ED Notes (Signed)
Patient given peanut butter crackers, apple juice and jello.

## 2021-04-29 NOTE — ED Notes (Signed)
Patient transported to CT 

## 2021-04-29 NOTE — ED Triage Notes (Signed)
Here for medication evaluation

## 2021-04-29 NOTE — ED Provider Notes (Signed)
[facility] EMERGENCY DEPARTMENT Provider Note   CSN: [id number] Arrival date & time: 04/29/21  1438     History Chief Complaint  Patient presents with   Hallucinations    Crystal Browning is a 87 y.o. female.  HPI 87 year old female presents with family member for hallucinations.  In the history is a little unclear as the patient tells me she is been having hallucinations for quite some time as does the daughter-in-law but it seems like is getting worse over the last couple weeks.  Recently had one of her nighttime bipolar medicines increased though patient and daughter-in-law are not sure which.  It is not helping her sleep which was the primary goal.  She seems to be having hallucinations chronically during the daytime but now it is becoming more over nighttime over the last 2 weeks.  She feels like she hears voices.  Sometimes this happens when she gets a UTI which is why family wants to get checked out.  They called the doctor who told him to come to the ER to get evaluated.  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   Radiation    S/P radiation thrapy -06/2005   TIA (transient  ischemic attack)    History of TIA's with no residual effect    Patient Active Problem List   Diagnosis Date Noted   Hallucination 12/09/2020   Pharyngoesophageal dysphagia 01/14/2018   Long term (current) use of anticoagulants 12/14/2016   Atrial fibrillation ([place]) 10/30/2016   Other fatigue 10/30/2016   Venous insufficiency of both lower extremities 10/30/2016   History of laryngeal cancer 08/20/2016   Xerostomia 08/20/2016   Postmenopausal 07/17/2015   Thyroid activity decreased 07/17/2015   Essential hypertension 07/12/2015   Angina pectoris ([place]) 07/12/2015   Vitamin D deficiency 07/12/2015    Past Surgical History:  Procedure Laterality Date   CARDIOVERSION N/A 11/23/2016   Procedure: CARDIOVERSION;  Surgeon: Pixie Casino, MD;  Location: [facility];  Service: Cardiovascular;  Laterality: N/A;   CATARACT EXTRACTION, BILATERAL     DIRECT LARYNGOSCOPY  03/27/2005   S/P direct laryngoscopy, esophagoscopy and biopsy with Dr. Constance Holster   TOTAL ABDOMINAL HYSTERECTOMY W/ BILATERAL SALPINGOOPHORECTOMY  1986   TUBAL LIGATION       OB History   No obstetric history on file.     Family History  Problem Relation Age of Onset   Stroke Mother    Heart disease Mother 52   Cancer Father 14       Prostate   Alcohol abuse Brother    COPD Brother     Social History   Tobacco Use  Smoking status: Never   Smokeless tobacco: Never  Vaping Use   Vaping Use: Never used  Substance Use Topics   Alcohol use: No   Drug use: No    Home Medications Prior to Admission medications   Medication Sig Start Date End Date Taking? Authorizing Provider  acetaminophen (TYLENOL) 325 MG tablet Take 2 tablets (650 mg total) by mouth every 6 (six) hours as needed for mild pain, fever or headache. 10/07/19  Yes Emokpae, Courage, MD  donepezil (ARICEPT) 5 MG tablet Take 5 mg by mouth daily. 03/08/21  Yes [provider]  ELIQUIS 5 MG TABS tablet TAKE 1 TABLET BY MOUTH TWICE A DAY  04/06/21  Yes Stacks, Cletus Gash, MD  escitalopram (LEXAPRO) 10 MG tablet TAKE 1 TABLET BY MOUTH EVERY DAY 02/27/21  Yes [name], MD  famotidine (PEPCID) 20 MG tablet Take 1 tablet (20 mg total) by mouth every 12 (twelve) hours. Patient taking differently: Take 20 mg by mouth daily. 03/30/20  Yes Stacks, Cletus Gash, MD  levothyroxine (SYNTHROID) 50 MCG tablet TAKE 1 TABLET BY MOUTH EVERY DAY 01/02/21  Yes Stacks, Cletus Gash, MD  metoprolol succinate (TOPROL-XL) 100 MG 24 hr tablet TAKE 1 TABLET DAILY WITH OR IMMEDIATELY FOLLOWING A MEAL. 03/30/21  Yes [name], MD  QUEtiapine (SEROQUEL) 25 MG tablet Take 1 tablet (25 mg total) by mouth at bedtime. Patient taking differently: Take 12.5 mg by mouth at bedtime. 12/22/20  Yes [name], MD  fluconazole (DIFLUCAN) 150 MG tablet Take 150 mg by mouth once. Patient not taking: Reported on 04/29/2021 02/24/21   [provider]  loratadine (CLARITIN) 10 MG tablet Take 10 mg by mouth daily. Patient not taking: Reported on 04/29/2021    [provider]  polyethylene glycol powder ([employer] PURELAX) 17 GM/SCOOP powder TAKE 17 GRAMS (1 CAPFUL) MIXED IN LIQUID AS DIRECTED 2 (TWO) TIMES DAILY. FOR CONSTIPATION Patient not taking: Reported on 04/29/2021 03/22/21   [name], MD  solifenacin (VESICARE) 5 MG tablet Take 1 tablet (5 mg total) by mouth daily. Patient not taking: Reported on 04/29/2021 10/10/20 10/10/21  [name], MD  TOVIAZ 4 MG TB24 tablet TAKE 1 TABLET BY MOUTH EVERY DAY Patient not taking: No sig reported 04/10/21   [name], MD    Allergies    Amlodipine besylate, Tuberculin tests, Claritin [loratadine], Paroxetine hcl, and Vit d-vit e-safflower oil  Review of Systems   Review of Systems  Constitutional:  Negative for fever.  Gastrointestinal:  Negative for abdominal pain.  Genitourinary:  Positive for dysuria.  Neurological:  Positive for numbness (intermittent, in feet) and headaches. Negative for weakness.   Psychiatric/Behavioral:  Positive for hallucinations.   All other systems reviewed and are negative.  Physical Exam Updated Vital Signs BP (!) 174/60   Pulse (!) 53   Temp (!) 97.3 F (36.3 C) (Oral)   Resp 16   Ht 5\' 1"  (1.549 m)   Wt 59.5 kg   SpO2 99%   BMI 24.79 kg/m   Physical Exam Vitals and nursing note reviewed.  Constitutional:      Appearance: She is well-developed.  HENT:     Head: Normocephalic and atraumatic.     Right Ear: External ear normal.     Left Ear: External ear normal.     Nose: Nose normal.  Eyes:     General:        Right eye: No discharge.        Left eye: No discharge.  Extraocular Movements: Extraocular movements intact.     Pupils: Pupils are equal, round, and reactive to light.  Cardiovascular:     Rate and Rhythm: Normal rate and regular rhythm.     Heart sounds: Normal heart sounds.  Pulmonary:     Effort: Pulmonary effort is normal.     Breath sounds: Normal breath sounds.  Abdominal:     Palpations: Abdomen is soft.     Tenderness: There is no abdominal tenderness.  Musculoskeletal:     Cervical back: Normal range of motion and neck supple.  Skin:    General: Skin is warm and dry.  Neurological:     Mental Status: She is alert and oriented to person, place, and time.     Comments: CN 3-12 grossly intact. 5/5 strength in all 4 extremities. Grossly normal sensation. Normal finger to nose.   Psychiatric:        Mood and Affect: Mood is not anxious.    ED Results / Procedures / Treatments   Labs (all labs ordered are listed, but only abnormal results are displayed) Labs Reviewed  COMPREHENSIVE METABOLIC PANEL - Abnormal; Notable for the following components:      Result Value   Calcium 8.8 (*)    All other components within normal limits  URINALYSIS, ROUTINE W REFLEX MICROSCOPIC - Abnormal; Notable for the following components:   Color, Urine STRAW (*)    All other components within normal limits  URINE CULTURE  ETHANOL   RAPID URINE DRUG SCREEN, HOSP PERFORMED  CBC WITH DIFFERENTIAL/PLATELET  CBG MONITORING, ED    EKG EKG Interpretation  Date/Time:  Saturday April 29 2021 19:23:05 EDT Ventricular Rate:  74 PR Interval:    QRS Duration: 111 QT Interval:  431 QTC Calculation: 479 R Axis:   -16 Text Interpretation: Atrial fibrillation Incomplete RBBB and LAFB Consider RVH w/ secondary repol abnormality similar to Aug 2022 Confirmed by Sherwood Gambler [phone]) on 04/29/2021 8:10:10 PM  Radiology CT Head Wo Contrast  Result Date: 04/29/2021 CLINICAL DATA:  Mental status change, unknown cause, hallucinations EXAM: CT HEAD WITHOUT CONTRAST TECHNIQUE: Contiguous axial images were obtained from the base of the skull through the vertex without intravenous contrast. COMPARISON:  12/07/2020 FINDINGS: Brain: No evidence of acute infarction, hemorrhage, hydrocephalus, extra-axial collection or mass lesion/mass effect. Scattered low-density changes within the periventricular and subcortical [place] matter compatible with chronic microvascular ischemic change. Mild diffuse cerebral volume loss. Vascular: Atherosclerotic calcifications involving the large vessels of the skull base. No unexpected hyperdense vessel. Skull: Normal. Negative for fracture or focal lesion. Sinuses/Orbits: No acute finding. Other: None. IMPRESSION: 1. No acute intracranial findings. 2. Mild chronic microvascular ischemic change and cerebral volume loss. Electronically Signed   By: Davina Poke D.O.   On: 04/29/2021 20:06    Procedures Procedures   Medications Ordered in ED Medications  sodium chloride 0.9 % bolus 500 mL (0 mLs Intravenous Stopped 04/29/21 2200)    ED Course  I have reviewed the triage vital signs and the nursing notes.  Pertinent labs & imaging results that were available during my care of the patient were reviewed by me and considered in my medical decision making (see chart for details).    MDM  Rules/Calculators/A&P                           Work-up is unremarkable including normal WBC, electrolytes besides minimal hypocalcemia, urinalysis and CT head.  My suspicion is this  is related to her dementia/psychiatric disease.  She is not agitated here.  She actually is oriented to person, place, time and situation.  Could be medication related or psychiatric disease related but I think she is stable for an outpatient management/follow-up as this has been ongoing for weeks.  Given return precautions.  Initially was fairly hypertensive but this is down trended and so I do not think hypertension is playing an acute role. Final Clinical Impression(s) / ED Diagnoses Final diagnoses:  Hallucinations    Rx / DC Orders ED Discharge Orders     None        Sherwood Gambler, MD 04/29/21 [phone]

## 2021-04-29 NOTE — ED Notes (Addendum)
Patient presents with daughter in law stating patient has been having visual and auditory hallucinations for a while however has been more frequent recently. States she recently started on a dementia medication and increased her bipolar medication. Patient states she has been having issues sleeping. Patient lives alone.   States PCP wants patient evaluated for side effects of seroquel.

## 2021-05-01 LAB — URINE CULTURE: Culture: NO GROWTH

## 2021-05-03 DIAGNOSIS — Z79899 Other long term (current) drug therapy: Secondary | ICD-10-CM | POA: Diagnosis not present

## 2021-05-03 DIAGNOSIS — G3183 Dementia with Lewy bodies: Secondary | ICD-10-CM | POA: Diagnosis not present

## 2021-05-03 DIAGNOSIS — F32A Depression, unspecified: Secondary | ICD-10-CM | POA: Diagnosis not present

## 2021-05-03 DIAGNOSIS — R441 Visual hallucinations: Secondary | ICD-10-CM | POA: Diagnosis not present

## 2021-05-03 DIAGNOSIS — I4891 Unspecified atrial fibrillation: Secondary | ICD-10-CM | POA: Diagnosis not present

## 2021-05-04 ENCOUNTER — Other Ambulatory Visit: Payer: Self-pay | Admitting: Family Medicine

## 2021-05-04 DIAGNOSIS — I4891 Unspecified atrial fibrillation: Secondary | ICD-10-CM

## 2021-05-23 ENCOUNTER — Ambulatory Visit: Payer: Medicare Other | Admitting: Family Medicine

## 2021-05-23 ENCOUNTER — Other Ambulatory Visit: Payer: Self-pay | Admitting: Family Medicine

## 2021-05-23 DIAGNOSIS — I4891 Unspecified atrial fibrillation: Secondary | ICD-10-CM

## 2021-05-23 DIAGNOSIS — I10 Essential (primary) hypertension: Secondary | ICD-10-CM

## 2021-05-24 ENCOUNTER — Other Ambulatory Visit: Payer: Self-pay | Admitting: Family Medicine

## 2021-05-24 DIAGNOSIS — F419 Anxiety disorder, unspecified: Secondary | ICD-10-CM

## 2021-05-27 DIAGNOSIS — R457 State of emotional shock and stress, unspecified: Secondary | ICD-10-CM | POA: Diagnosis not present

## 2021-05-27 DIAGNOSIS — R069 Unspecified abnormalities of breathing: Secondary | ICD-10-CM | POA: Diagnosis not present

## 2021-05-27 DIAGNOSIS — I10 Essential (primary) hypertension: Secondary | ICD-10-CM | POA: Diagnosis not present

## 2021-05-27 DIAGNOSIS — I4891 Unspecified atrial fibrillation: Secondary | ICD-10-CM | POA: Diagnosis not present

## 2021-05-31 ENCOUNTER — Encounter: Payer: Self-pay | Admitting: Family Medicine

## 2021-05-31 ENCOUNTER — Ambulatory Visit: Payer: Medicare Other | Admitting: Family Medicine

## 2021-06-01 ENCOUNTER — Other Ambulatory Visit: Payer: Self-pay | Admitting: Family Medicine

## 2021-06-01 DIAGNOSIS — I4891 Unspecified atrial fibrillation: Secondary | ICD-10-CM

## 2021-06-05 ENCOUNTER — Other Ambulatory Visit: Payer: Self-pay

## 2021-06-05 ENCOUNTER — Encounter: Payer: Self-pay | Admitting: Family Medicine

## 2021-06-05 ENCOUNTER — Ambulatory Visit (INDEPENDENT_AMBULATORY_CARE_PROVIDER_SITE_OTHER): Payer: Medicare Other | Admitting: Family Medicine

## 2021-06-05 VITALS — BP 169/67 | HR 82 | Temp 97.0°F | Ht 61.0 in | Wt 125.6 lb

## 2021-06-05 DIAGNOSIS — S161XXA Strain of muscle, fascia and tendon at neck level, initial encounter: Secondary | ICD-10-CM

## 2021-06-05 MED ORDER — CELECOXIB 200 MG PO CAPS: 200.0000 mg | ORAL_CAPSULE | Freq: Every day | ORAL | 5 refills | Status: DC

## 2021-06-05 NOTE — Progress Notes (Addendum)
Chief Complaint  Patient presents with   neck swelling    HPI  Patient presents today for noting a lump in the neck on the right.  This is painful for her.  Not sure how long its been there just noted a few days ago.  It is painful but stable.  It is not affected by activities including chewing swallowing etc.  She does have a lot of baseline neck pain throughout the neck but it is not focused in this 1 spot.   PMH: Smoking status noted ROS: Per HPI  Objective: BP (!) 169/67   Pulse 82   Temp (!) 97 F (36.1 C)   Ht 5\' 1"  (1.549 m)   Wt 125 lb 9.6 oz (57 kg)   SpO2 97%   BMI 23.73 kg/m  Gen: NAD, alert, cooperative with exam HEENT: NCAT, EOMI, PERRL CV: RRR, good S1/S2, no murmur Resp: CTABL, no wheezes, non-labored Abd: SNTND, BS present, no guarding or organomegaly Ext: No edema, warm.  There is spasm and swelling about the mid section of the sternocleidomastoid muscle at its prominence in the mid section of the anterolateral neck on the right. Neuro: Alert and oriented, No gross deficits  Assessment and plan:  1. Strain of sternocleidomastoid muscle, initial encounter     Meds ordered this encounter  Medications   celecoxib (CELEBREX) 200 MG capsule    Sig: Take 1 capsule (200 mg total) by mouth daily. With food    Dispense:  30 capsule    Refill:  5    I recommended alternating heat and ice.  Daughter would like to use IcyHot as well.  I I think that should be sufficient.  Follow-up if symptoms persist.   Follow up as needed.  [name], MD

## 2021-06-13 DIAGNOSIS — E538 Deficiency of other specified B group vitamins: Secondary | ICD-10-CM | POA: Diagnosis not present

## 2021-06-13 DIAGNOSIS — F32A Depression, unspecified: Secondary | ICD-10-CM | POA: Diagnosis not present

## 2021-06-13 DIAGNOSIS — G3183 Dementia with Lewy bodies: Secondary | ICD-10-CM | POA: Diagnosis not present

## 2021-06-13 DIAGNOSIS — I4891 Unspecified atrial fibrillation: Secondary | ICD-10-CM | POA: Diagnosis not present

## 2021-06-13 DIAGNOSIS — R441 Visual hallucinations: Secondary | ICD-10-CM | POA: Diagnosis not present

## 2021-06-19 ENCOUNTER — Other Ambulatory Visit: Payer: Self-pay | Admitting: Family Medicine

## 2021-06-19 DIAGNOSIS — I10 Essential (primary) hypertension: Secondary | ICD-10-CM

## 2021-06-19 DIAGNOSIS — I4891 Unspecified atrial fibrillation: Secondary | ICD-10-CM

## 2021-06-19 NOTE — Telephone Encounter (Signed)
Stacks. NTBS 30 days given 05/28/21, did not show for that days appt

## 2021-06-20 ENCOUNTER — Encounter: Payer: Self-pay | Admitting: Family Medicine

## 2021-06-20 NOTE — Telephone Encounter (Signed)
LMTCB TO SCHEDULE APPT TO HAVE MEDICATION REFILLED/LETTER MAILED

## 2021-06-24 ENCOUNTER — Other Ambulatory Visit: Payer: Self-pay | Admitting: Family Medicine

## 2021-06-24 DIAGNOSIS — I10 Essential (primary) hypertension: Secondary | ICD-10-CM

## 2021-06-24 DIAGNOSIS — E039 Hypothyroidism, unspecified: Secondary | ICD-10-CM

## 2021-06-24 DIAGNOSIS — I4891 Unspecified atrial fibrillation: Secondary | ICD-10-CM

## 2021-06-28 ENCOUNTER — Encounter: Payer: Self-pay | Admitting: Family Medicine

## 2021-06-28 ENCOUNTER — Ambulatory Visit (INDEPENDENT_AMBULATORY_CARE_PROVIDER_SITE_OTHER): Payer: Medicare Other | Admitting: Family Medicine

## 2021-06-28 VITALS — BP 194/82 | HR 60 | Temp 98.2°F | Ht 61.0 in | Wt 125.2 lb

## 2021-06-28 DIAGNOSIS — N3090 Cystitis, unspecified without hematuria: Secondary | ICD-10-CM | POA: Diagnosis not present

## 2021-06-28 DIAGNOSIS — Z23 Encounter for immunization: Secondary | ICD-10-CM | POA: Diagnosis not present

## 2021-06-28 DIAGNOSIS — Z13220 Encounter for screening for lipoid disorders: Secondary | ICD-10-CM | POA: Diagnosis not present

## 2021-06-28 DIAGNOSIS — I10 Essential (primary) hypertension: Secondary | ICD-10-CM

## 2021-06-28 DIAGNOSIS — E039 Hypothyroidism, unspecified: Secondary | ICD-10-CM | POA: Diagnosis not present

## 2021-06-28 DIAGNOSIS — R441 Visual hallucinations: Secondary | ICD-10-CM | POA: Diagnosis not present

## 2021-06-28 DIAGNOSIS — I4891 Unspecified atrial fibrillation: Secondary | ICD-10-CM

## 2021-06-28 DIAGNOSIS — F419 Anxiety disorder, unspecified: Secondary | ICD-10-CM | POA: Diagnosis not present

## 2021-06-28 LAB — URINALYSIS, COMPLETE
Bilirubin, UA: NEGATIVE
Glucose, UA: NEGATIVE
Ketones, UA: NEGATIVE
Leukocytes,UA: NEGATIVE
Nitrite, UA: NEGATIVE
Protein,UA: NEGATIVE
Specific Gravity, UA: 1.015 (ref 1.005–1.030)
Urobilinogen, Ur: 0.2 mg/dL (ref 0.2–1.0)
pH, UA: 6 (ref 5.0–7.5)

## 2021-06-28 LAB — MICROSCOPIC EXAMINATION
Bacteria, UA: NONE SEEN
Epithelial Cells (non renal): NONE SEEN /hpf (ref 0–10)
RBC, Urine: NONE SEEN /hpf (ref 0–2)
Renal Epithel, UA: NONE SEEN /hpf
WBC, UA: NONE SEEN /hpf (ref 0–5)

## 2021-06-28 MED ORDER — LEVOTHYROXINE SODIUM 50 MCG PO TABS: 50.0000 ug | ORAL_TABLET | Freq: Every day | ORAL | 3 refills | Status: DC

## 2021-06-28 MED ORDER — METOPROLOL SUCCINATE ER 100 MG PO TB24: ORAL_TABLET | ORAL | 3 refills | Status: DC

## 2021-06-28 MED ORDER — QUETIAPINE FUMARATE 25 MG PO TABS: 25.0000 mg | ORAL_TABLET | Freq: Every day | ORAL | 1 refills | Status: DC

## 2021-06-28 MED ORDER — ESCITALOPRAM OXALATE 10 MG PO TABS: 10.0000 mg | ORAL_TABLET | Freq: Every day | ORAL | 3 refills | Status: DC

## 2021-06-28 MED ORDER — APIXABAN 5 MG PO TABS: 5.0000 mg | ORAL_TABLET | Freq: Two times a day (BID) | ORAL | 3 refills | Status: DC

## 2021-06-28 NOTE — Progress Notes (Signed)
Subjective:  Patient ID: Crystal Browning, female    DOB: [birth year]  Age: 87 y.o. MRN: [id number]  CC: Medical Management of Chronic Issues   HPI Crystal Browning presents for  follow-up on  thyroid. The patient has a history of hypothyroidism for many years. It has been stable recently. Pt. denies any change in  voice, loss of hair, heat or cold intolerance. Energy level has been adequate to good. Patient denies constipation and diarrhea. No myxedema. Medication is as noted below. Verified that pt is taking it daily on an empty stomach. Well tolerated. GAD 7 : Generalized Anxiety Score 06/05/2021 01/04/2021 01/25/2016  Nervous, Anxious, on Edge 0 1 1  Control/stop worrying '1 1 3  ' Worry too much - different things '1 1 3  ' Trouble relaxing 1 0 0  Restless 0 0 0  Easily annoyed or irritable - 1 0  Afraid - awful might happen 1 0 2  Total GAD 7 Score - 4 9  Anxiety Difficulty Somewhat difficult Not difficult at all -    Depression screen [facility] 2/9 06/28/2021 06/05/2021 06/05/2021 03/23/2021 01/19/2021  Decreased Interest 0 2 0 0 0  Down, Depressed, Hopeless 0 1 0 0 0  PHQ - 2 Score 0 3 0 0 0  Altered sleeping - 2 - - -  Tired, decreased energy - 2 - - -  Change in appetite - 2 - - -  Feeling bad or failure about yourself  - 0 - - -  Trouble concentrating - 0 - - -  Moving slowly or fidgety/restless - 0 - - -  Suicidal thoughts - 0 - - -  PHQ-9 Score - 9 - - -  Difficult doing work/chores - Somewhat difficult - - -  Some recent data might be hidden    Belly pain flares sometimes. Generally okay. Eating better now.  Also her spells of rage are gone since starting the "memory medicine."  Depression screen [facility] 2/9 06/28/2021 06/05/2021 06/05/2021  Decreased Interest 0 2 0  Down, Depressed, Hopeless 0 1 0  PHQ - 2 Score 0 3 0  Altered sleeping - 2 -  Tired, decreased energy - 2 -  Change in appetite - 2 -  Feeling bad or failure about yourself  - 0 -  Trouble concentrating - 0 -  Moving  slowly or fidgety/restless - 0 -  Suicidal thoughts - 0 -  PHQ-9 Score - 9 -  Difficult doing work/chores - Somewhat difficult -  Some recent data might be hidden    History Tyneka has a past medical history of Arthritis, Atrial fibrillation ([place]) (10/30/2016), Bilateral lower extremity edema (10/30/2016), Cancer ([place]) (03/28/2011), CAP (community acquired pneumonia) (10/04/2019), Dementia ([place]), Depression, Diverticulosis (02/2005), Esophageal stricture (03/03/2018), Generalized abdominal pain (07/17/2015), History of chemotherapy, History of external beam radiation therapy (08/20/2016), Hypertension, Hypothyroid, Osteoporosis, 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   She has a past surgical history that includes Direct laryngoscopy (03/27/2005); Tubal ligation; Total abdominal hysterectomy w/ bilateral salpingoophorectomy (1986); Cataract extraction, bilateral; and Cardioversion (N/A, 11/23/2016).   Her family history includes Alcohol abuse in her brother; COPD in her brother; Cancer (age of onset: 42) in her father; Heart disease (age of onset: 55) in her mother; Stroke in her mother.She reports that she has never smoked. She has never used smokeless tobacco. She reports that she does not drink alcohol and does not use drugs.    ROS Review  of Systems  Constitutional: Negative.   HENT: Negative.    Eyes:  Negative for visual disturbance.  Respiratory:  Negative for shortness of breath.   Cardiovascular:  Negative for chest pain.  Gastrointestinal:  Negative for abdominal pain.  Musculoskeletal:  Negative for arthralgias.   Objective:  BP (!) 194/82    Pulse 60    Temp 98.2 F (36.8 C)    Ht '5\' 1"'  (1.549 m)    Wt 125 lb 3.2 oz (56.8 kg)    SpO2 98%    BMI 23.66 kg/m   BP Readings from Last 3 Encounters:  06/28/21 (!) 194/82  06/05/21 (!) 169/67  04/29/21 (!) 174/60    Wt Readings from Last 3 Encounters:  06/28/21  125 lb 3.2 oz (56.8 kg)  06/05/21 125 lb 9.6 oz (57 kg)  04/29/21 131 lb 2.8 oz (59.5 kg)     Physical Exam Constitutional:      General: She is not in acute distress.    Appearance: She is well-developed.  Cardiovascular:     Rate and Rhythm: Normal rate and regular rhythm.  Pulmonary:     Breath sounds: Normal breath sounds.  Musculoskeletal:        General: Normal range of motion.  Skin:    General: Skin is warm and dry.  Neurological:     General: No focal deficit present.     Mental Status: She is alert and oriented to person, place, and time.  Psychiatric:        Mood and Affect: Mood normal.      Assessment & Plan:   Crystal Browning was seen today for medical management of chronic issues.  Diagnoses and all orders for this visit:  Essential hypertension -     CBC with Differential/Platelet -     CMP14+EGFR -     metoprolol succinate (TOPROL-XL) 100 MG 24 hr tablet; TAKE 1 TABLET DAILY WITH OR IMMEDIATELY FOLLOWING A MEAL.  Hypothyroidism, unspecified type -     TSH + free T4 -     levothyroxine (SYNTHROID) 50 MCG tablet; Take 1 tablet (50 mcg total) by mouth daily.  Lipid screening -     Lipid panel  Atrial fibrillation, unspecified type (HCC) -     apixaban (ELIQUIS) 5 MG TABS tablet; Take 1 tablet (5 mg total) by mouth 2 (two) times daily. -     metoprolol succinate (TOPROL-XL) 100 MG 24 hr tablet; TAKE 1 TABLET DAILY WITH OR IMMEDIATELY FOLLOWING A MEAL.  Anxiety -     escitalopram (LEXAPRO) 10 MG tablet; Take 1 tablet (10 mg total) by mouth daily.  Hallucinations, visual -     QUEtiapine (SEROQUEL) 25 MG tablet; Take 1 tablet (25 mg total) by mouth at bedtime.  Cystitis -     Urinalysis, Complete      I have changed Tatiyanna B. Zelaya's Eliquis to apixaban. I have also changed her escitalopram and levothyroxine. I am also having her maintain her acetaminophen, famotidine, donepezil, celecoxib, metoprolol succinate, and QUEtiapine.  Allergies as of  06/28/2021       Reactions   Amlodipine Besylate Hives   Hives and confusion.   Tuberculin Tests Swelling   Claritin [loratadine] Rash   Pt's daughter states she is allergic to claritin   Paroxetine Hcl Rash   Vit D-vit E-safflower Oil         Medication List        Accurate as of June 28, 2021  2:32 PM. If  you have any questions, ask your nurse or doctor.          acetaminophen 325 MG tablet Commonly known as: TYLENOL Take 2 tablets (650 mg total) by mouth every 6 (six) hours as needed for mild pain, fever or headache.   apixaban 5 MG Tabs tablet Commonly known as: Eliquis Take 1 tablet (5 mg total) by mouth 2 (two) times daily. What changed: how much to take Changed by: [name], MD   celecoxib 200 MG capsule Commonly known as: CeleBREX Take 1 capsule (200 mg total) by mouth daily. With food   donepezil 5 MG tablet Commonly known as: ARICEPT Take 5 mg by mouth daily.   escitalopram 10 MG tablet Commonly known as: LEXAPRO Take 1 tablet (10 mg total) by mouth daily.   famotidine 20 MG tablet Commonly known as: PEPCID Take 1 tablet (20 mg total) by mouth every 12 (twelve) hours. What changed: when to take this   levothyroxine 50 MCG tablet Commonly known as: SYNTHROID Take 1 tablet (50 mcg total) by mouth daily.   metoprolol succinate 100 MG 24 hr tablet Commonly known as: TOPROL-XL TAKE 1 TABLET DAILY WITH OR IMMEDIATELY FOLLOWING A MEAL.   QUEtiapine 25 MG tablet Commonly known as: SEROQUEL Take 1 tablet (25 mg total) by mouth at bedtime. What changed: how much to take         Follow-up: Return in about 3 months (around 09/26/2021).  [name], M.D.

## 2021-06-29 LAB — LIPID PANEL
Chol/HDL Ratio: 3.6 ratio (ref 0.0–4.4)
Cholesterol, Total: 184 mg/dL (ref 100–199)
HDL: 51 mg/dL (ref >39)
LDL Chol Calc (NIH): 114 mg/dL — ABNORMAL HIGH (ref 0–99)
Triglycerides: 104 mg/dL (ref 0–149)
VLDL Cholesterol Cal: 19 mg/dL (ref 5–40)

## 2021-06-29 LAB — CMP14+EGFR
ALT: 13 IU/L (ref 0–32)
AST: 30 IU/L (ref 0–40)
Albumin/Globulin Ratio: 1.7 (ref 1.2–2.2)
Albumin: 4.4 g/dL (ref 3.6–4.6)
Alkaline Phosphatase: 111 IU/L (ref 44–121)
BUN/Creatinine Ratio: 11 — ABNORMAL LOW (ref 12–28)
BUN: 9 mg/dL (ref 8–27)
Bilirubin Total: 0.6 mg/dL (ref 0.0–1.2)
CO2: 23 mmol/L (ref 20–29)
Calcium: 9.2 mg/dL (ref 8.7–10.3)
Chloride: 101 mmol/L (ref 96–106)
Creatinine, Ser: 0.81 mg/dL (ref 0.57–1.00)
Globulin, Total: 2.6 g/dL (ref 1.5–4.5)
Glucose: 82 mg/dL (ref 70–99)
Potassium: 4.5 mmol/L (ref 3.5–5.2)
Sodium: 139 mmol/L (ref 134–144)
Total Protein: 7 g/dL (ref 6.0–8.5)
eGFR: 71 mL/min/{1.73_m2} (ref >59)

## 2021-06-29 LAB — CBC WITH DIFFERENTIAL/PLATELET
Basophils Absolute: 0 10*3/uL (ref 0.0–0.2)
Basos: 0 %
EOS (ABSOLUTE): 0.2 10*3/uL (ref 0.0–0.4)
Eos: 4 %
Hematocrit: 38 % (ref 34.0–46.6)
Hemoglobin: 12.6 g/dL (ref 11.1–15.9)
Immature Grans (Abs): 0 10*3/uL (ref 0.0–0.1)
Immature Granulocytes: 0 %
Lymphocytes Absolute: 1.3 10*3/uL (ref 0.7–3.1)
Lymphs: 24 %
MCH: 31.9 pg (ref 26.6–33.0)
MCHC: 33.2 g/dL (ref 31.5–35.7)
MCV: 96 fL (ref 79–97)
Monocytes Absolute: 0.6 10*3/uL (ref 0.1–0.9)
Monocytes: 11 %
Neutrophils Absolute: 3.3 10*3/uL (ref 1.4–7.0)
Neutrophils: 61 %
Platelets: 230 10*3/uL (ref 150–450)
RBC: 3.95 x10E6/uL (ref 3.77–5.28)
RDW: 12.6 % (ref 11.7–15.4)
WBC: 5.5 10*3/uL (ref 3.4–10.8)

## 2021-06-29 LAB — TSH+FREE T4
Free T4: 1.28 ng/dL (ref 0.82–1.77)
TSH: 2.99 u[IU]/mL (ref 0.450–4.500)

## 2021-07-05 NOTE — Progress Notes (Signed)
Hello Dalphine,  Your lab result is normal and/or stable.Some minor variations that are not significant are commonly marked abnormal, but do not represent any medical problem for you.  Best regards, Thaddaeus Granja, M.D.

## 2021-07-13 DIAGNOSIS — M79651 Pain in right thigh: Secondary | ICD-10-CM | POA: Diagnosis not present

## 2021-09-14 ENCOUNTER — Ambulatory Visit: Payer: Medicare Other | Admitting: Family Medicine

## 2021-09-14 ENCOUNTER — Other Ambulatory Visit: Payer: Self-pay | Admitting: Family Medicine

## 2021-09-14 ENCOUNTER — Ambulatory Visit (INDEPENDENT_AMBULATORY_CARE_PROVIDER_SITE_OTHER): Payer: Medicare Other | Admitting: Family Medicine

## 2021-09-14 ENCOUNTER — Encounter: Payer: Self-pay | Admitting: Family Medicine

## 2021-09-14 ENCOUNTER — Ambulatory Visit: Payer: Medicare Other | Admitting: Internal Medicine

## 2021-09-14 VITALS — BP 187/76 | HR 54 | Temp 97.5°F | Ht 61.0 in | Wt 125.0 lb

## 2021-09-14 DIAGNOSIS — N3941 Urge incontinence: Secondary | ICD-10-CM

## 2021-09-14 DIAGNOSIS — R32 Unspecified urinary incontinence: Secondary | ICD-10-CM

## 2021-09-14 DIAGNOSIS — R300 Dysuria: Secondary | ICD-10-CM | POA: Diagnosis not present

## 2021-09-14 LAB — MICROSCOPIC EXAMINATION: Renal Epithel, UA: NONE SEEN /hpf

## 2021-09-14 LAB — URINALYSIS, COMPLETE
Bilirubin, UA: NEGATIVE
Glucose, UA: NEGATIVE
Nitrite, UA: NEGATIVE
RBC, UA: NEGATIVE
Specific Gravity, UA: 1.02 (ref 1.005–1.030)
Urobilinogen, Ur: 0.2 mg/dL (ref 0.2–1.0)
pH, UA: 5 (ref 5.0–7.5)

## 2021-09-14 MED ORDER — FELODIPINE ER 5 MG PO TB24: 5.0000 mg | ORAL_TABLET | Freq: Every day | ORAL | 2 refills | Status: DC

## 2021-09-14 MED ORDER — SOLIFENACIN SUCCINATE 5 MG PO TABS: 5.0000 mg | ORAL_TABLET | Freq: Every day | ORAL | 3 refills | Status: DC

## 2021-09-14 NOTE — Progress Notes (Signed)
? ?Subjective:  ?Patient ID: Crystal Browning, female    DOB: 09-16-1933  Age: 88 y.o. MRN: [id number] ? ?CC: Dysuria ? ? ?HPI ?[name] presents for urinary incontinence.  She says she loses urine when she is trying to go to the bathroom.  She has to change her clothes frequently.  She does not have enuresis.  She says sometimes she will have some burning.  There is no frequency or urgency.  She has discontinued her Toviaz.  Her daughter is with her and says that the insurance as they are not to pay for it anymore. ? ?Depression screen [facility] 2/9 09/14/2021 06/28/2021 06/05/2021  ?Decreased Interest 0 0 2  ?Down, Depressed, Hopeless 0 0 1  ?PHQ - 2 Score 0 0 3  ?Altered sleeping - - 2  ?Tired, decreased energy - - 2  ?Change in appetite - - 2  ?Feeling bad or failure about yourself  - - 0  ?Trouble concentrating - - 0  ?Moving slowly or fidgety/restless - - 0  ?Suicidal thoughts - - 0  ?PHQ-9 Score - - 9  ?Difficult doing work/chores - - Somewhat difficult  ?Some recent data might be hidden  ? ? 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  ? ?She has a past surgical history that includes Direct laryngoscopy (03/27/2005); Tubal ligation; Total abdominal hysterectomy w/ bilateral salpingoophorectomy (1986); Cataract extraction, bilateral; and Cardioversion (N/A, 11/23/2016).  ? ?Her family history includes Alcohol abuse in her brother; COPD in her brother; Cancer (age of onset: 63) in her father;  Heart disease (age of onset: 75) in her mother; Stroke in her mother.She reports that she has never smoked. She has never used smokeless tobacco. She reports that she does not drink alcohol and does not use drugs. ? ? ? ?ROS ?Review of Systems  ?Constitutional:  Negative for chills, diaphoresis and fever.  ?HENT:  Negative for congestion.   ?Respiratory:  Negative for cough.   ?Gastrointestinal:  Negative for diarrhea and nausea.  ?Genitourinary:  Negative for decreased urine volume, dysuria, flank pain, frequency, hematuria, menstrual problem, pelvic pain and urgency.  ?Musculoskeletal:  Negative for joint swelling.  ?Neurological:  Negative for dizziness and numbness.  ? ?Objective:  ?BP (!) 187/76   Pulse (!) 54   Temp (!) 97.5 ?F (36.4 ?C)   Ht 5\' 1"  (1.549 m)   Wt 125 lb (56.7 kg)   SpO2 95%   BMI 23.62 kg/m?  ? ?BP Readings from Last 3 Encounters:  ?09/14/21 (!) 187/76  ?06/28/21 (!) 194/82  ?06/05/21 (!) 169/67  ? ? ?Wt Readings from Last 3 Encounters:  ?09/14/21 125 lb (56.7 kg)  ?06/28/21 125 lb 3.2 oz (56.8 kg)  ?06/05/21 125 lb 9.6 oz (57 kg)  ? ? ? ?Physical Exam ? ? ? ?Assessment & Plan:  ? ?Kalicia was seen today for dysuria. ? ?Diagnoses and all orders for this visit: ? ?Dysuria ?-     Urinalysis, Complete ?-     Urine Culture ? ? ? ? ? ? ?I am having Doralene B. Kosh maintain her acetaminophen, famotidine, donepezil, celecoxib, apixaban, escitalopram, levothyroxine, metoprolol succinate, and QUEtiapine. ? ?  Allergies as of 09/14/2021   ? ?   Reactions  ? Amlodipine Besylate Hives  ? Hives and confusion.  ? Tuberculin Tests Swelling  ? Claritin [loratadine] Rash  ? Pt's daughter states she is allergic to claritin  ? Paroxetine Hcl Rash  ? Vit D-vit E-safflower Oil   ? ?  ? ?  ?Medication List  ?  ? ?  ? Accurate as of September 14, 2021 10:39 AM. If you have any questions, ask your nurse or doctor.  ?  ?  ? ?  ? ?acetaminophen 325 MG tablet ?Commonly known as: TYLENOL ?Take 2 tablets (650 mg total) by mouth  every 6 (six) hours as needed for mild pain, fever or headache. ?  ?apixaban 5 MG Tabs tablet ?Commonly known as: Eliquis ?Take 1 tablet (5 mg total) by mouth 2 (two) times daily. ?  ?celecoxib 200 MG capsule ?Commonly known as: CeleBREX ?Take 1 capsule (200 mg total) by mouth daily. With food ?  ?donepezil 5 MG tablet ?Commonly known as: ARICEPT ?Take 5 mg by mouth daily. ?  ?escitalopram 10 MG tablet ?Commonly known as: LEXAPRO ?Take 1 tablet (10 mg total) by mouth daily. ?  ?famotidine 20 MG tablet ?Commonly known as: PEPCID ?Take 1 tablet (20 mg total) by mouth every 12 (twelve) hours. ?What changed: when to take this ?  ?levothyroxine 50 MCG tablet ?Commonly known as: SYNTHROID ?Take 1 tablet (50 mcg total) by mouth daily. ?  ?metoprolol succinate 100 MG 24 hr tablet ?Commonly known as: TOPROL-XL ?TAKE 1 TABLET DAILY WITH OR IMMEDIATELY FOLLOWING A MEAL. ?  ?QUEtiapine 25 MG tablet ?Commonly known as: SEROQUEL ?Take 1 tablet (25 mg total) by mouth at bedtime. ?  ? ?  ? ? ? ?Follow-up: No follow-ups on file. ? ?[name], M.D. ?

## 2021-09-15 ENCOUNTER — Ambulatory Visit: Payer: Medicare Other | Admitting: Family Medicine

## 2021-09-18 ENCOUNTER — Other Ambulatory Visit: Payer: Self-pay | Admitting: Family Medicine

## 2021-09-18 MED ORDER — SULFAMETHOXAZOLE-TRIMETHOPRIM 800-160 MG PO TABS: 1.0000 | ORAL_TABLET | Freq: Two times a day (BID) | ORAL | 0 refills | Status: DC

## 2021-09-19 ENCOUNTER — Other Ambulatory Visit: Payer: Self-pay | Admitting: Family Medicine

## 2021-09-19 LAB — URINE CULTURE

## 2021-09-19 MED ORDER — LEVOFLOXACIN 500 MG PO TABS: 500.0000 mg | ORAL_TABLET | Freq: Every day | ORAL | 0 refills | Status: DC

## 2021-09-19 NOTE — Progress Notes (Signed)
Your urine culture shows the presence of a germ that is resistant to the current antibiotic you are taking. Please discontinue that medication and take the new one I have sent to your pharmacy.  Best Regards, Murielle Stang, M.D.  

## 2021-09-21 ENCOUNTER — Ambulatory Visit (INDEPENDENT_AMBULATORY_CARE_PROVIDER_SITE_OTHER): Payer: Medicare Other

## 2021-09-21 ENCOUNTER — Emergency Department (HOSPITAL_COMMUNITY): Payer: Medicare Other

## 2021-09-21 ENCOUNTER — Emergency Department (HOSPITAL_COMMUNITY)
Admission: EM | Admit: 2021-09-21 | Discharge: 2021-09-22 | Disposition: A | Discharge status: Home / Self Care | Payer: Medicare Other | Attending: Emergency Medicine | Admitting: Emergency Medicine

## 2021-09-21 ENCOUNTER — Encounter: Payer: Self-pay | Admitting: Family Medicine

## 2021-09-21 ENCOUNTER — Ambulatory Visit (INDEPENDENT_AMBULATORY_CARE_PROVIDER_SITE_OTHER): Payer: Medicare Other | Admitting: Family Medicine

## 2021-09-21 ENCOUNTER — Encounter (HOSPITAL_COMMUNITY): Payer: Self-pay | Admitting: *Deleted

## 2021-09-21 VITALS — BP 189/64 | HR 66 | Temp 98.0°F | Resp 20 | Ht 61.0 in | Wt 125.0 lb

## 2021-09-21 DIAGNOSIS — S0990XA Unspecified injury of head, initial encounter: Secondary | ICD-10-CM

## 2021-09-21 DIAGNOSIS — M542 Cervicalgia: Secondary | ICD-10-CM

## 2021-09-21 DIAGNOSIS — W19XXXA Unspecified fall, initial encounter: Secondary | ICD-10-CM

## 2021-09-21 DIAGNOSIS — R531 Weakness: Secondary | ICD-10-CM | POA: Diagnosis not present

## 2021-09-21 DIAGNOSIS — I10 Essential (primary) hypertension: Secondary | ICD-10-CM | POA: Diagnosis not present

## 2021-09-21 DIAGNOSIS — E039 Hypothyroidism, unspecified: Secondary | ICD-10-CM | POA: Diagnosis not present

## 2021-09-21 DIAGNOSIS — G319 Degenerative disease of nervous system, unspecified: Secondary | ICD-10-CM | POA: Diagnosis not present

## 2021-09-21 DIAGNOSIS — Y92009 Unspecified place in unspecified non-institutional (private) residence as the place of occurrence of the external cause: Secondary | ICD-10-CM | POA: Insufficient documentation

## 2021-09-21 DIAGNOSIS — Z7901 Long term (current) use of anticoagulants: Secondary | ICD-10-CM | POA: Insufficient documentation

## 2021-09-21 DIAGNOSIS — F0390 Unspecified dementia without behavioral disturbance: Secondary | ICD-10-CM | POA: Insufficient documentation

## 2021-09-21 DIAGNOSIS — Z79899 Other long term (current) drug therapy: Secondary | ICD-10-CM | POA: Insufficient documentation

## 2021-09-21 DIAGNOSIS — S161XXA Strain of muscle, fascia and tendon at neck level, initial encounter: Secondary | ICD-10-CM

## 2021-09-21 DIAGNOSIS — S199XXA Unspecified injury of neck, initial encounter: Secondary | ICD-10-CM | POA: Diagnosis not present

## 2021-09-21 DIAGNOSIS — M25512 Pain in left shoulder: Secondary | ICD-10-CM | POA: Diagnosis not present

## 2021-09-21 DIAGNOSIS — I6529 Occlusion and stenosis of unspecified carotid artery: Secondary | ICD-10-CM | POA: Diagnosis not present

## 2021-09-21 DIAGNOSIS — I517 Cardiomegaly: Secondary | ICD-10-CM | POA: Diagnosis not present

## 2021-09-21 LAB — COMPREHENSIVE METABOLIC PANEL
ALT: 19 U/L (ref 0–44)
AST: 35 U/L (ref 15–41)
Albumin: 4 g/dL (ref 3.5–5.0)
Alkaline Phosphatase: 85 U/L (ref 38–126)
Anion gap: 6 (ref 5–15)
BUN: 15 mg/dL (ref 8–23)
CO2: 25 mmol/L (ref 22–32)
Calcium: 9.2 mg/dL (ref 8.9–10.3)
Chloride: 105 mmol/L (ref 98–111)
Creatinine, Ser: 1.04 mg/dL — ABNORMAL HIGH (ref 0.44–1.00)
GFR, Estimated: 53 mL/min — ABNORMAL LOW (ref >60)
Glucose, Bld: 106 mg/dL — ABNORMAL HIGH (ref 70–99)
Potassium: 4.3 mmol/L (ref 3.5–5.1)
Sodium: 136 mmol/L (ref 135–145)
Total Bilirubin: 0.6 mg/dL (ref 0.3–1.2)
Total Protein: 7.5 g/dL (ref 6.5–8.1)

## 2021-09-21 LAB — CBC WITH DIFFERENTIAL/PLATELET
Abs Immature Granulocytes: 0.01 10*3/uL (ref 0.00–0.07)
Basophils Absolute: 0 10*3/uL (ref 0.0–0.1)
Basophils Relative: 0 %
Eosinophils Absolute: 0.2 10*3/uL (ref 0.0–0.5)
Eosinophils Relative: 3 %
HCT: 40.4 % (ref 36.0–46.0)
Hemoglobin: 13.1 g/dL (ref 12.0–15.0)
Immature Granulocytes: 0 %
Lymphocytes Relative: 25 %
Lymphs Abs: 1.3 10*3/uL (ref 0.7–4.0)
MCH: 31.8 pg (ref 26.0–34.0)
MCHC: 32.4 g/dL (ref 30.0–36.0)
MCV: 98.1 fL (ref 80.0–100.0)
Monocytes Absolute: 0.6 10*3/uL (ref 0.1–1.0)
Monocytes Relative: 12 %
Neutro Abs: 3.2 10*3/uL (ref 1.7–7.7)
Neutrophils Relative %: 60 %
Platelets: 208 10*3/uL (ref 150–400)
RBC: 4.12 MIL/uL (ref 3.87–5.11)
RDW: 14.1 % (ref 11.5–15.5)
WBC: 5.3 10*3/uL (ref 4.0–10.5)
nRBC: 0 % (ref 0.0–0.2)

## 2021-09-21 IMAGING — CT CT HEAD W/O CM
4 series · 17 of 47 positions shown, 19 images · non-contrast
Comparison: [DATE]

CLINICAL DATA: Head trauma



[Series 2: head bone · axial · 0.44mm/px · z∈[+1661,+1719]mm · 4 of 84 slices shown]
[im 9/84  bone]
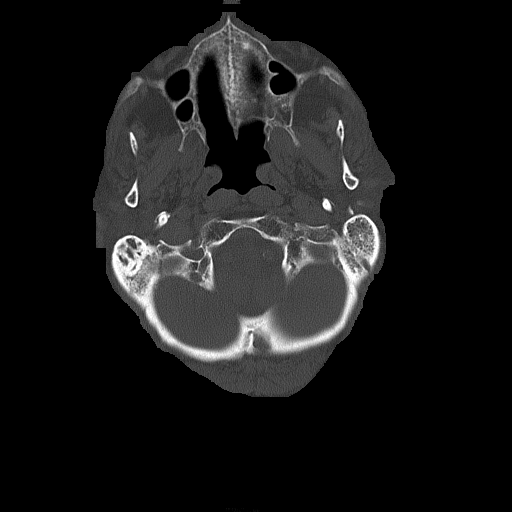
[im 17/84  bone]
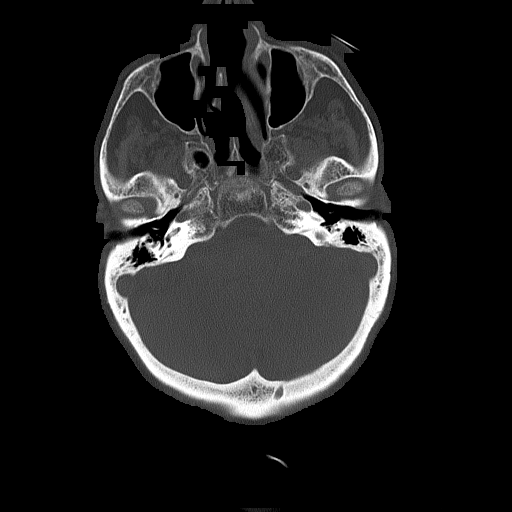
[im 25/84  bone]
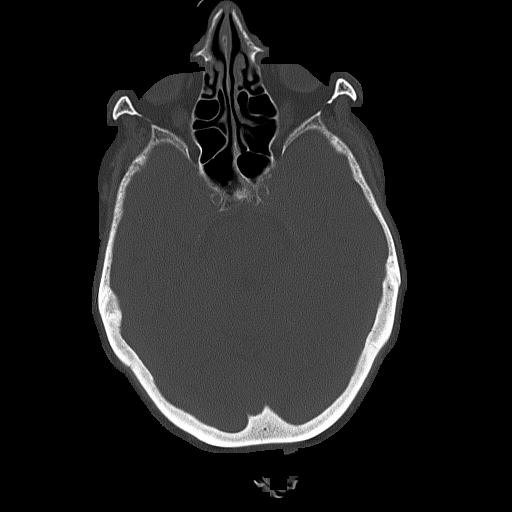
[im 38/84  bone]
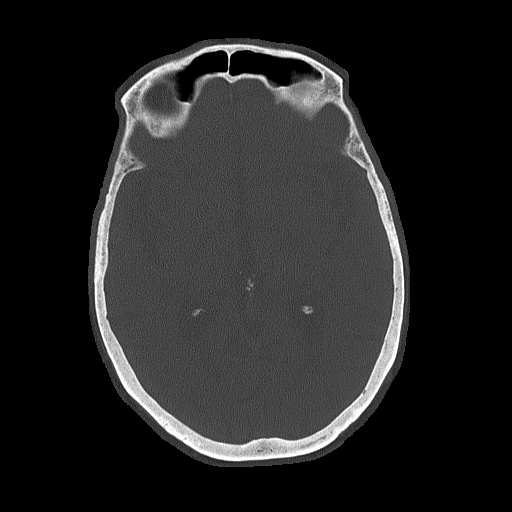

[Series 3: head w o · axial · 0.44mm/px · z∈[+1665,+1785]mm · 7 of 34 slices shown, 9 images]
[im 5/34  brain]
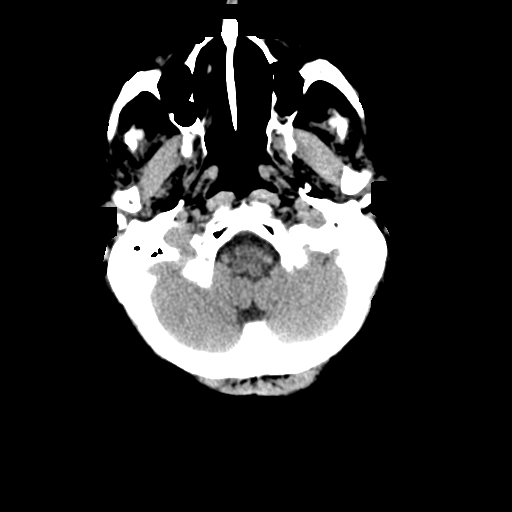
[im 5/34  bone]
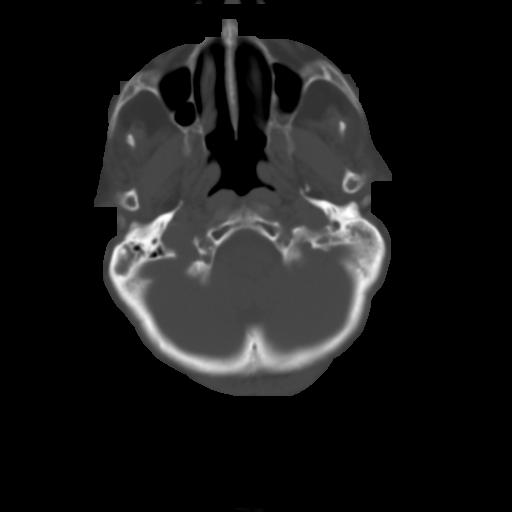
[im 9/34  brain]
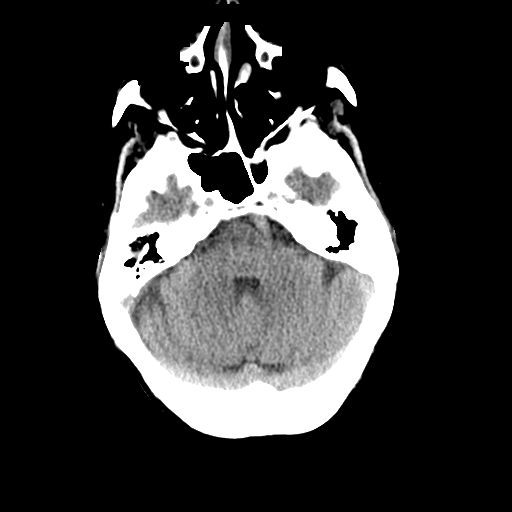
[im 13/34  brain]
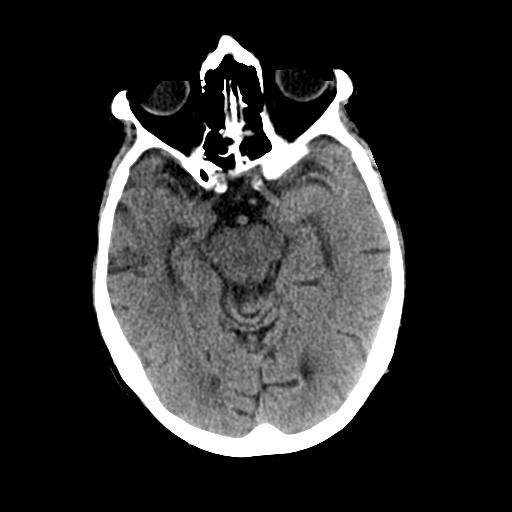
[im 17/34  brain]
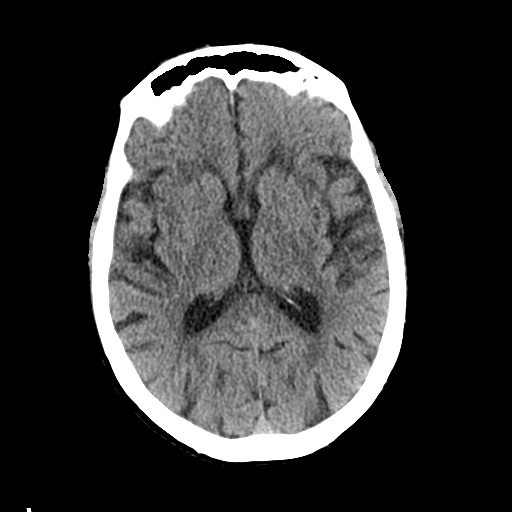
[im 21/34  brain]
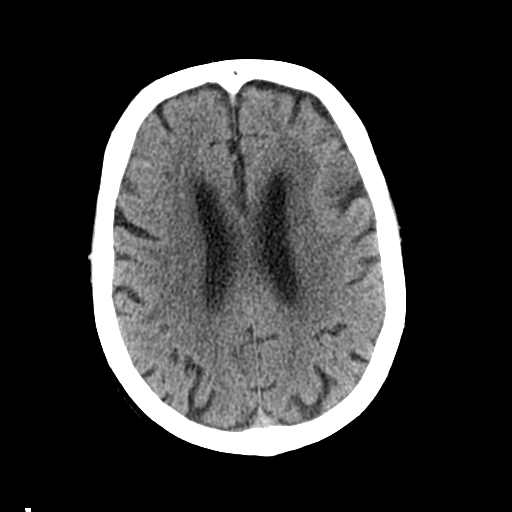
[im 21/34  bone]
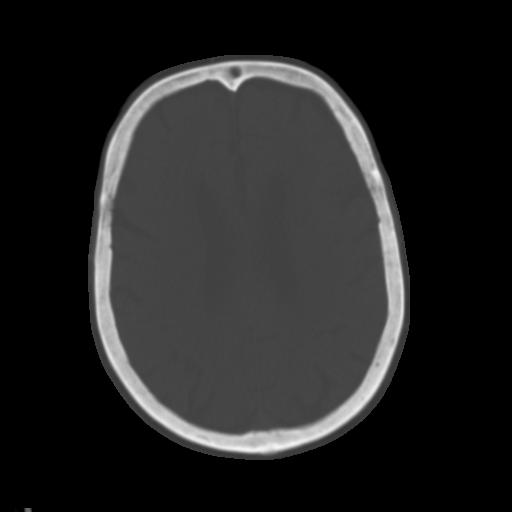
[im 25/34  brain]
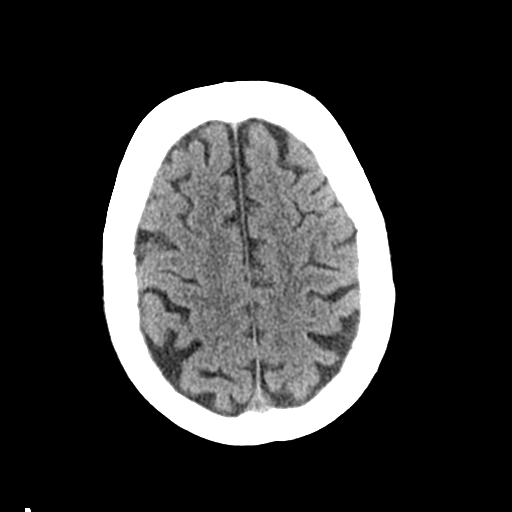
[im 29/34  brain]
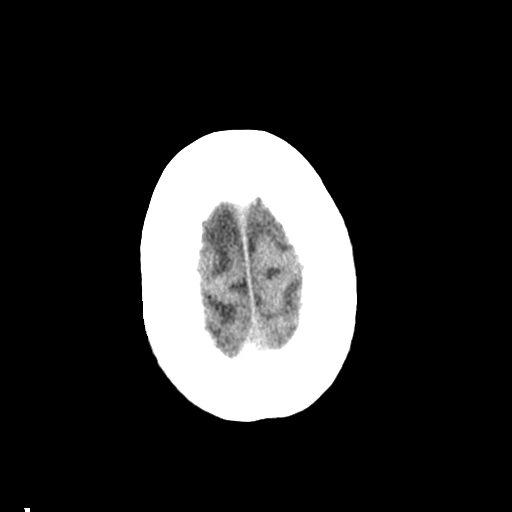

[Series 4: coronal soft · coronal · 0.35mm/px · 3 of 69 slices shown]
[im 23/69  brain]
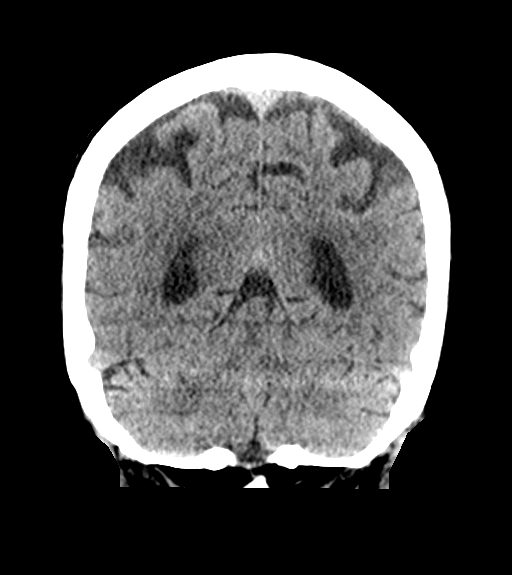
[im 31/69  brain]
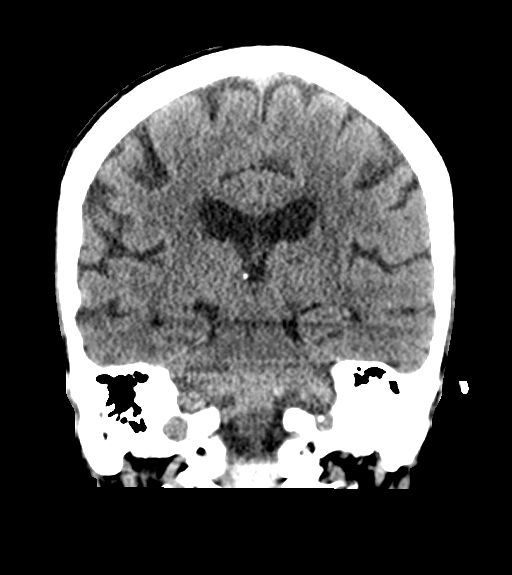
[im 38/69  brain]
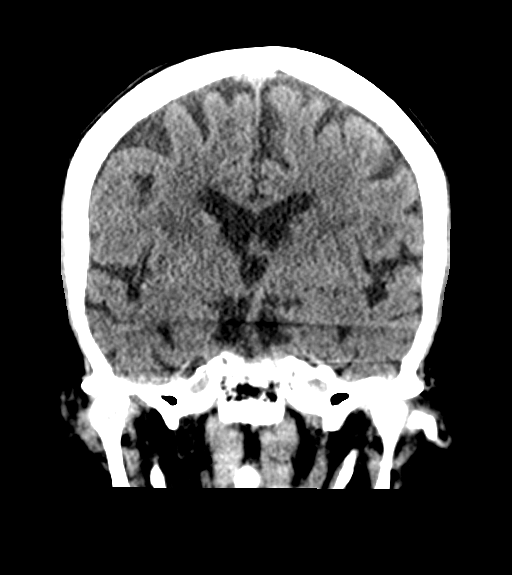

[Series 5: sagittal soft · sagittal · 0.39mm/px · 3 of 60 slices shown]
[im 20/60  brain]
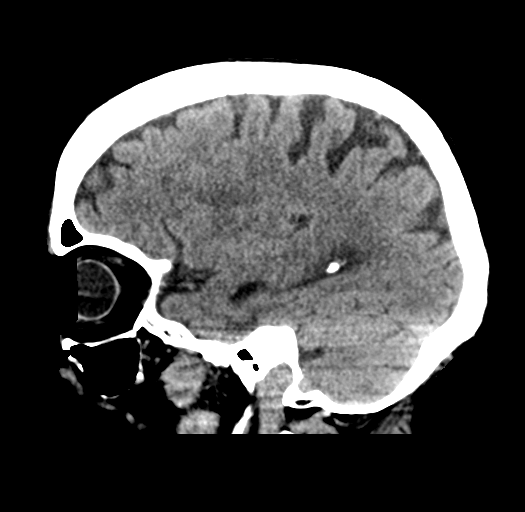
[im 30/60  brain]
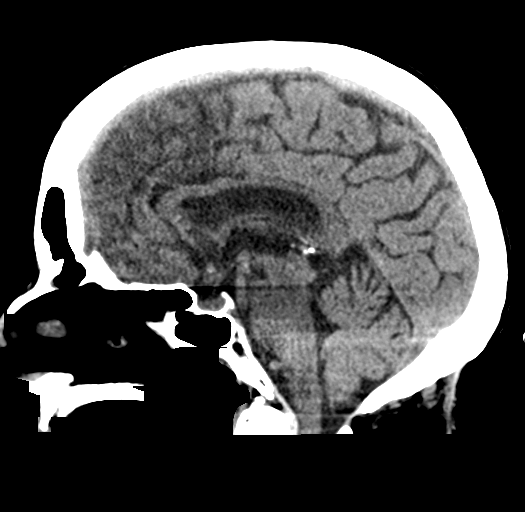
[im 40/60  brain]
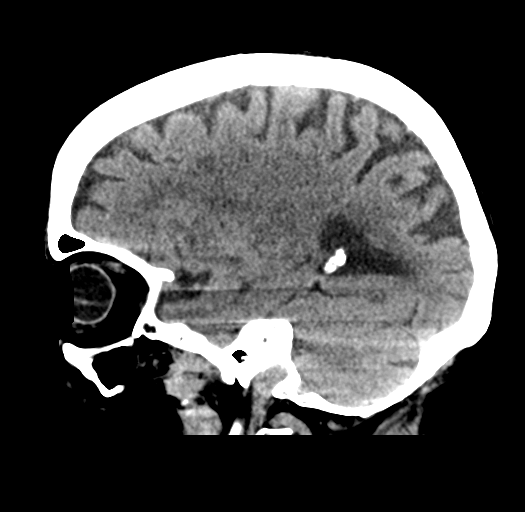

[17 of 47 positions shown; findings below may reference images not displayed]

FINDINGS: Brain: No acute territorial infarction, hemorrhage, or intracranial
mass. White matter hypodensity likely chronic small vessel ischemic
change. Mild atrophy. Stable ventricle size

Vascular: No hyperdense vessels. Vertebral and carotid vascular
calcification

Skull: Trace fluid right mastoid.  No fracture

Sinuses/Orbits: No acute finding.

Other: None
IMPRESSION: 1. No CT evidence for acute intracranial abnormality.
2. Mild atrophy and chronic small vessel ischemic changes.

## 2021-09-21 IMAGING — DX DG CERVICAL SPINE 2 OR 3 VIEWS
2 series · 2 of 2 positions shown · non-contrast
Comparison: Cervical spine radiographs [DATE]

CLINICAL DATA: Left shoulder and neck pain, fall

EXAM:
CERVICAL SPINE - 2-3 VIEW

[c-spine lat]
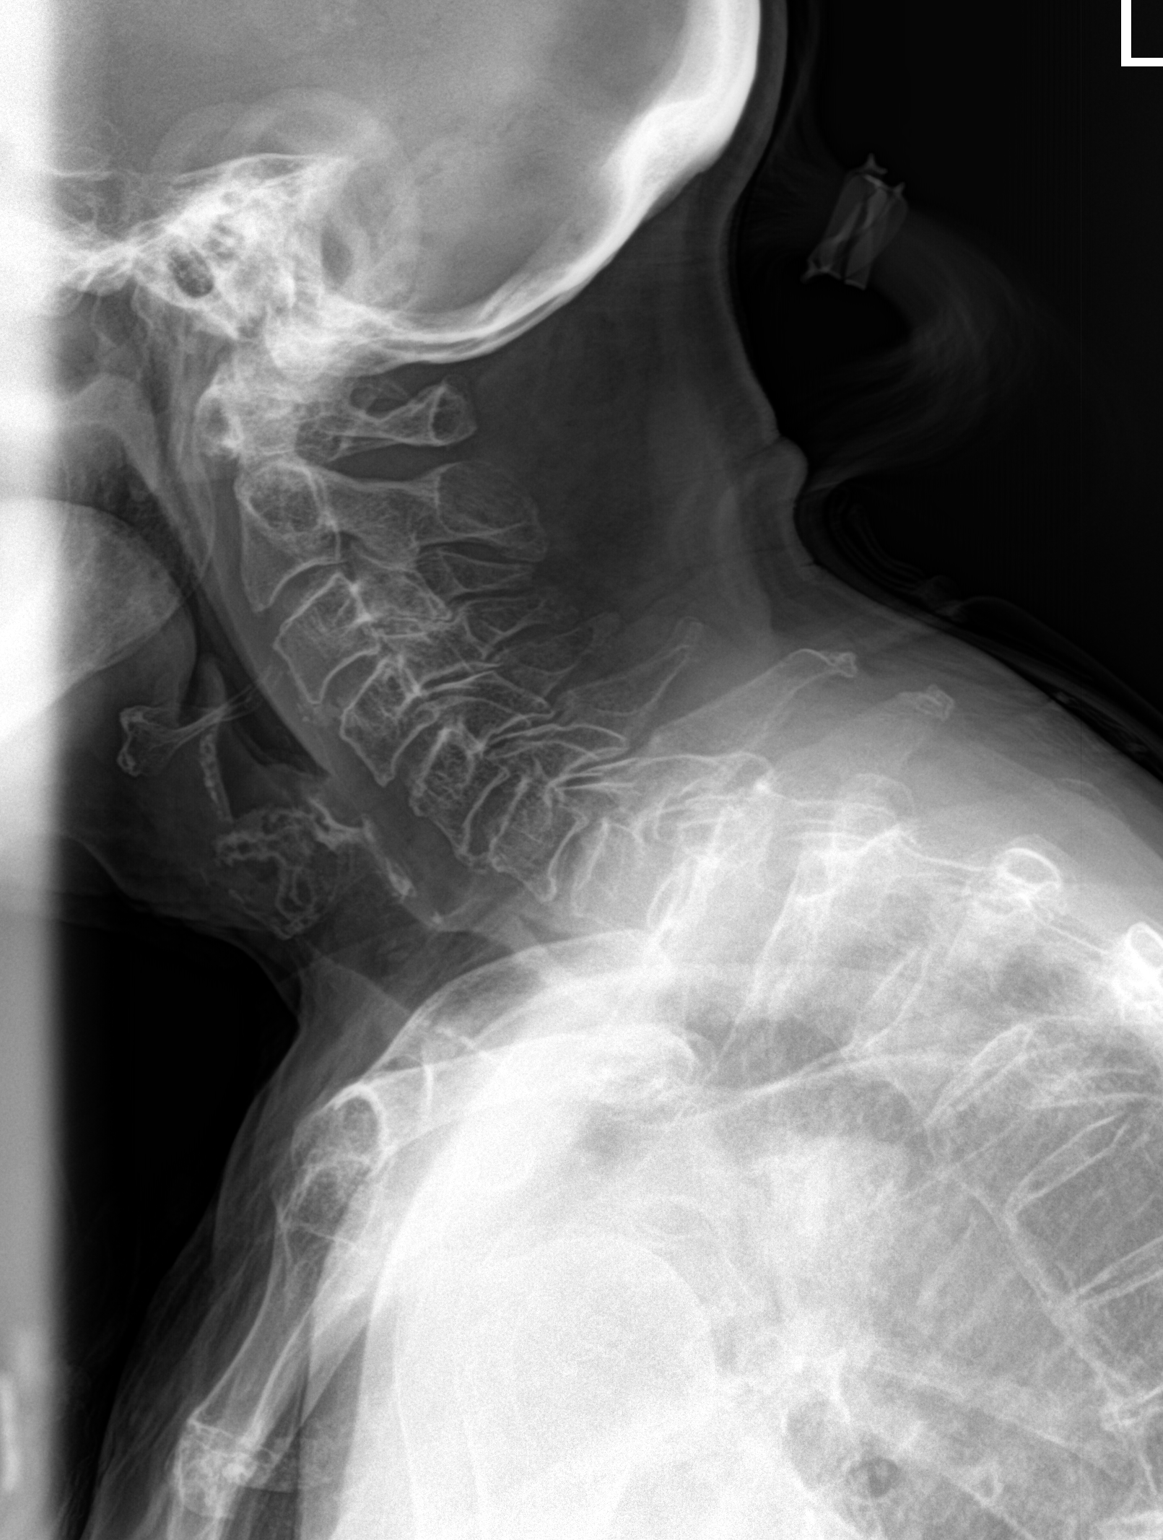

[c-spine ap]
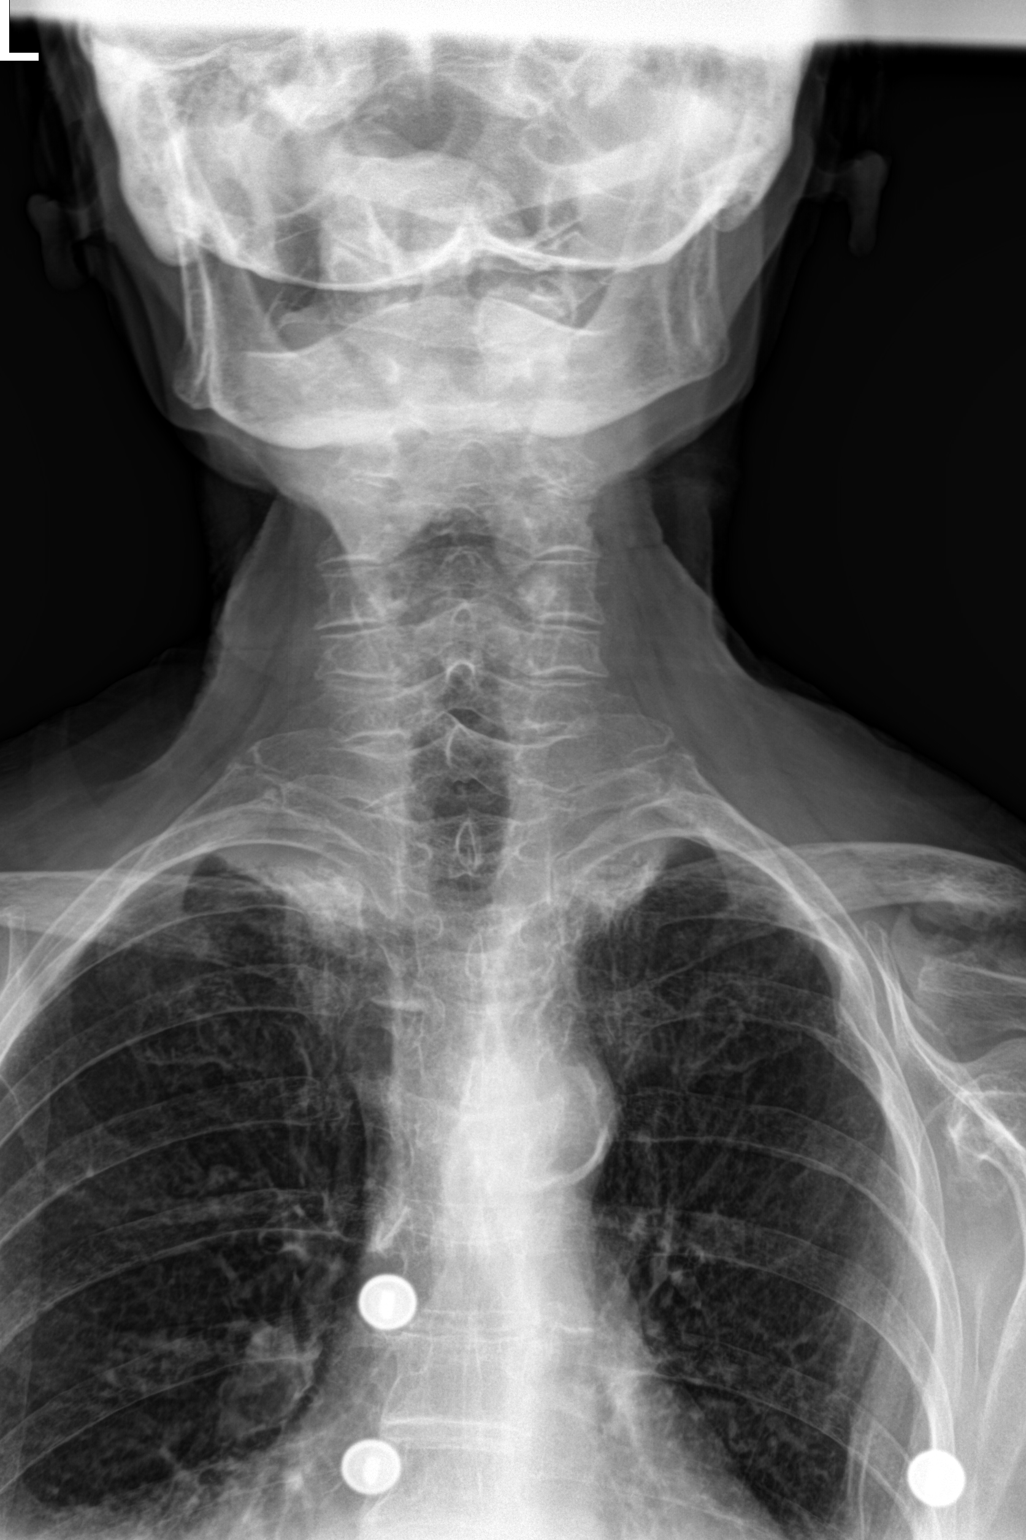

[2 of 2 positions shown; findings below may reference images not displayed]

FINDINGS: The cervical spine is imaged through the C7 vertebral body in the
lateral projection. The bones are osteopenic. Vertebral body heights
are preserved, without definite evidence of acute injury. There is
trace grade 1 anterolisthesis of C5 on C6, likely degenerative in
nature. There is no evidence of traumatic malalignment. The disc
heights are overall preserved. There is mild multilevel degenerative
endplate change.

The prevertebral soft tissues are unremarkable.
IMPRESSION: No definite evidence of acute injury in the cervical spine. If there
is persistent clinical concern, cross-sectional imaging is
recommended.

## 2021-09-21 IMAGING — DX DG CHEST 1V
1 series · 1 of 1 positions shown · non-contrast
Comparison: [DATE], [DATE], clavicle x-ray [DATE]

CLINICAL DATA: Fall

EXAM:
CHEST  1 VIEW

[chest ap]
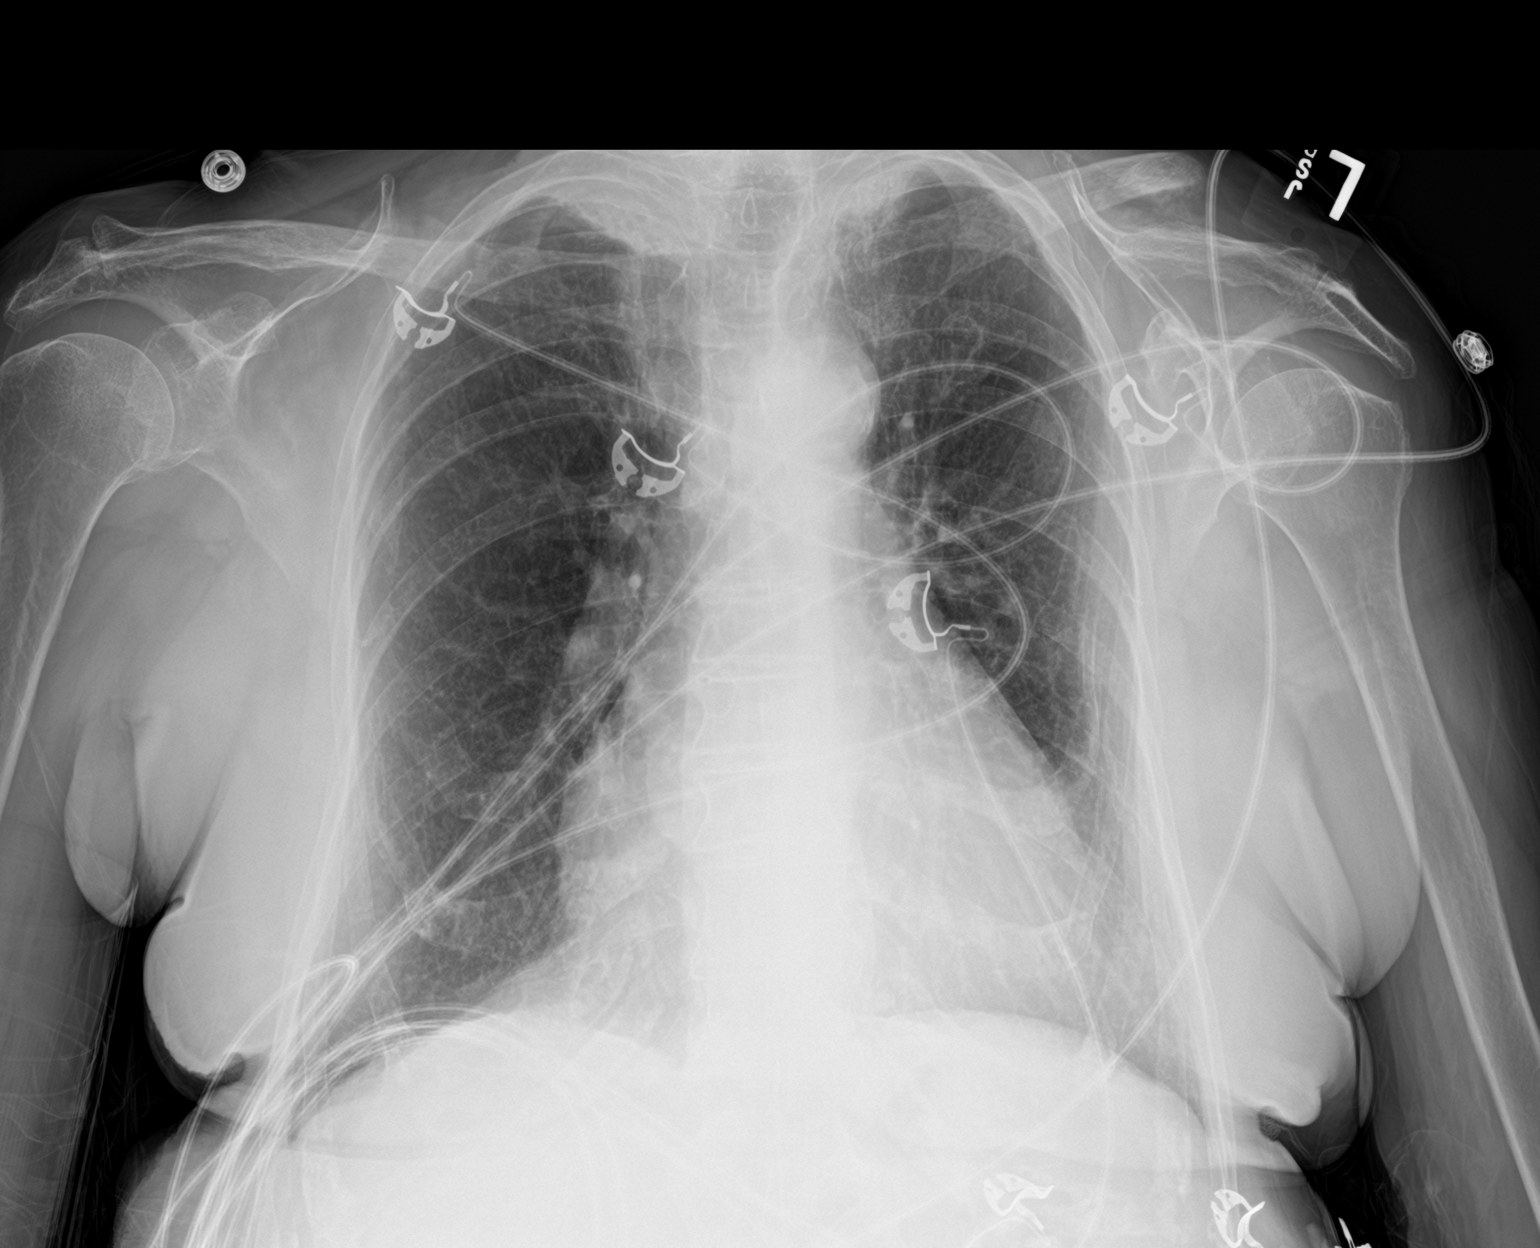

[1 of 1 positions shown; findings below may reference images not displayed]

FINDINGS: Mild cardiomegaly. No focal opacity, pleural effusion, or
pneumothorax. Right greater than left apical pleural and parenchymal
scarring. Multiple chronic bilateral rib fractures. Chronic ununited
left mid clavicle fracture.
IMPRESSION: No active disease.  Mild cardiomegaly.

## 2021-09-21 IMAGING — DX DG SHOULDER 2+V*L*
4 series · 4 of 4 positions shown · non-contrast
Comparison: [DATE]

CLINICAL DATA: Shoulder and neck pain fall

EXAM:
LEFT SHOULDER - 2+ VIEW

[shoulder ap (1 of 4)]
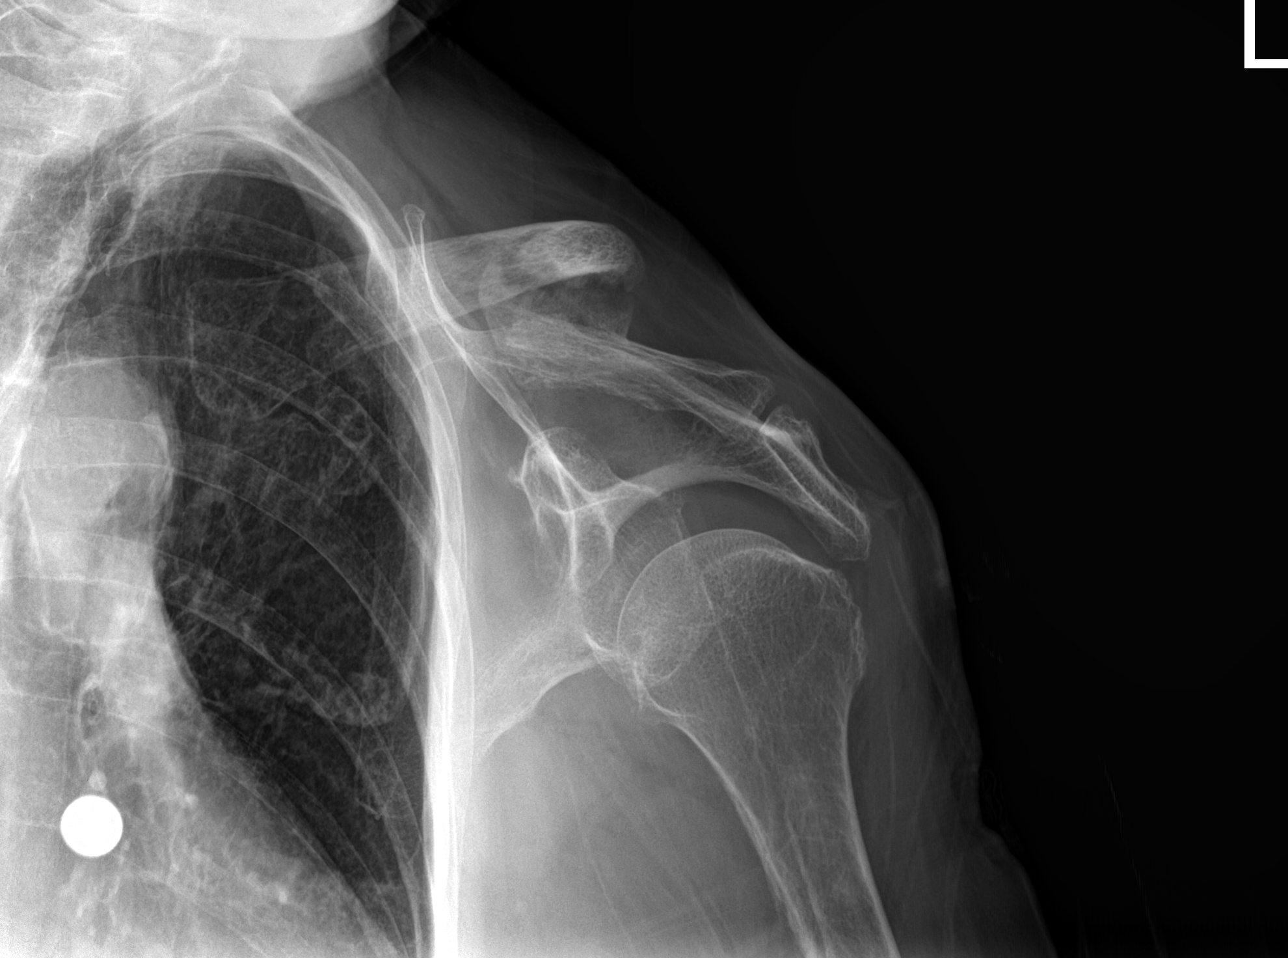

[shoulder ap (2 of 4)]
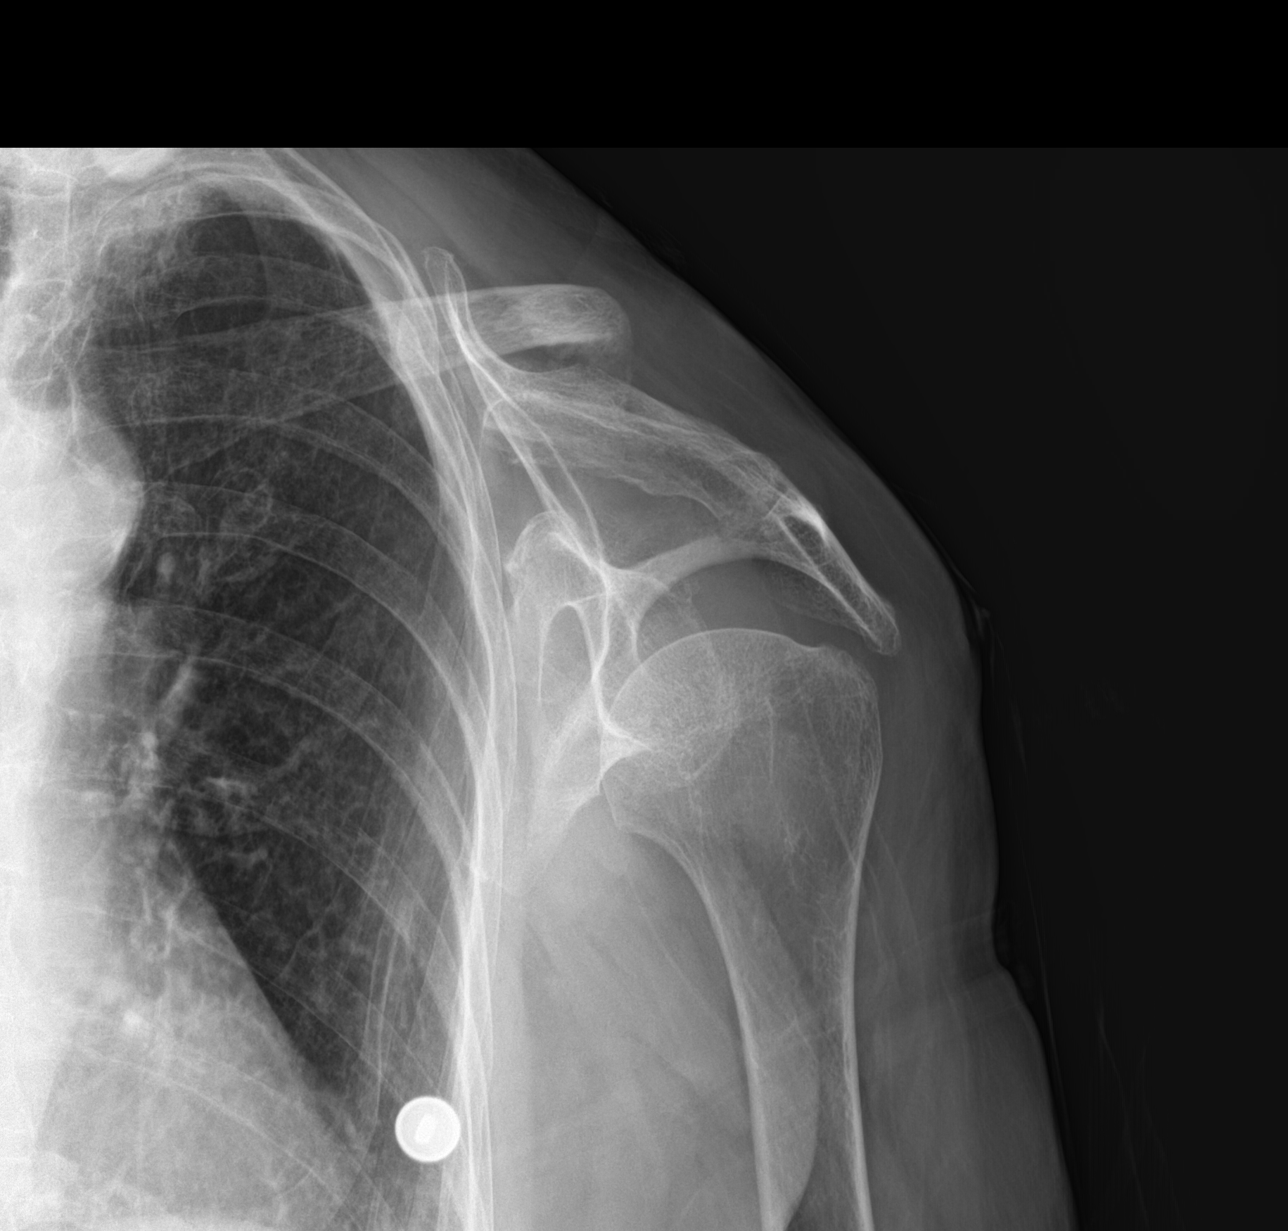

[shoulder ap (3 of 4)]
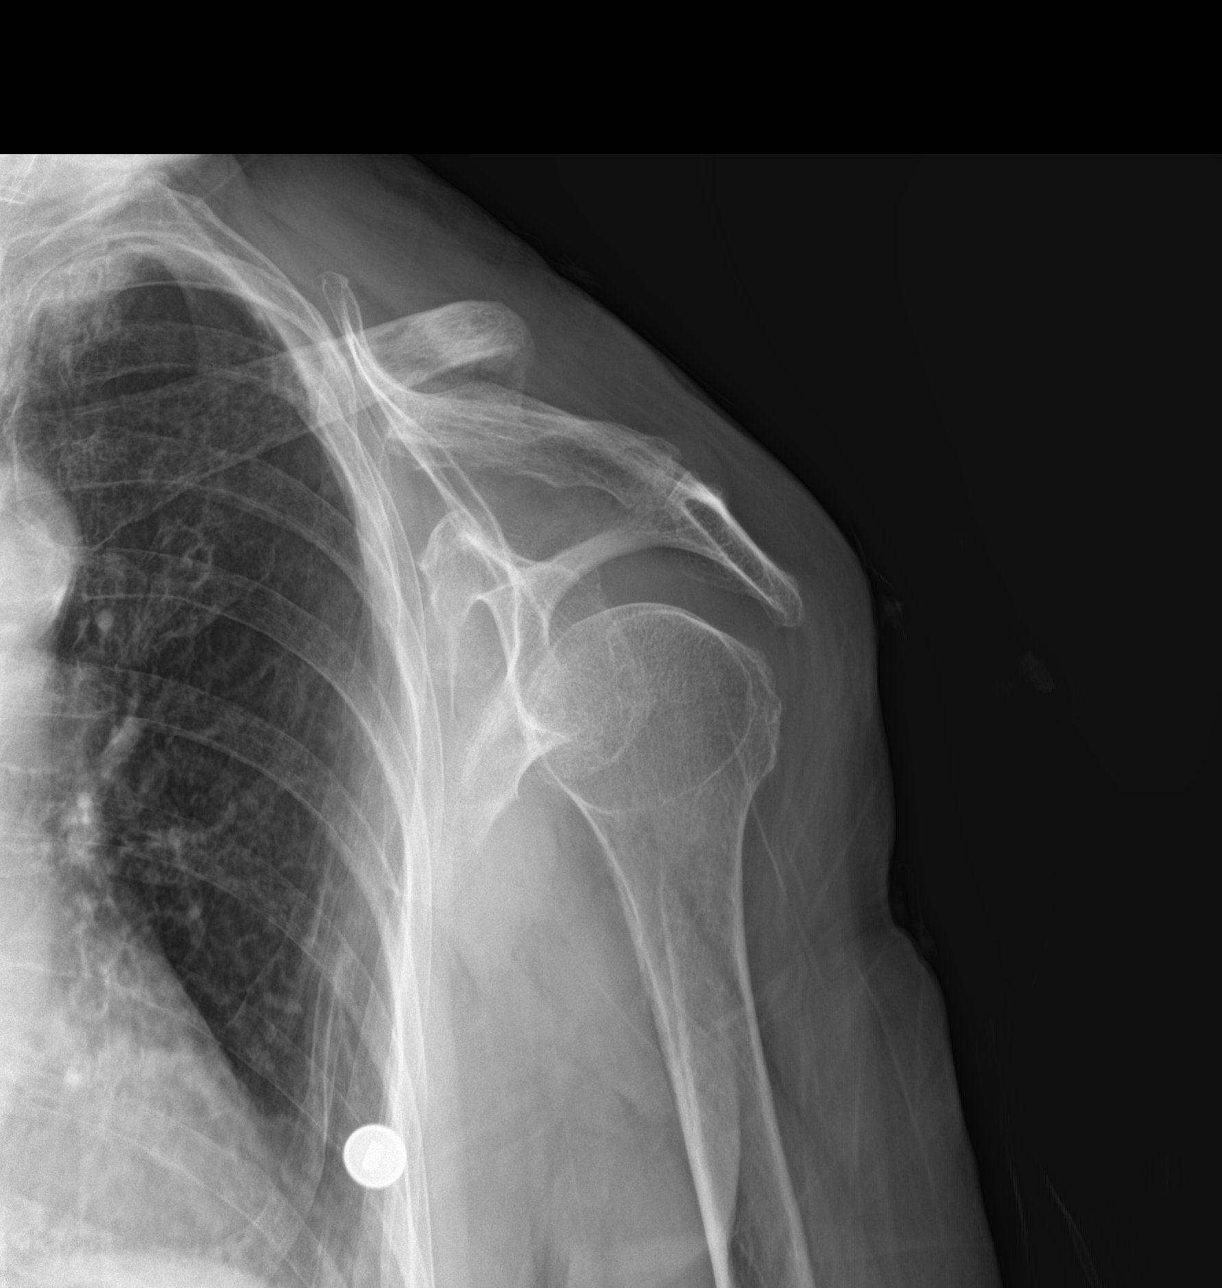

[shoulder ap (4 of 4)]
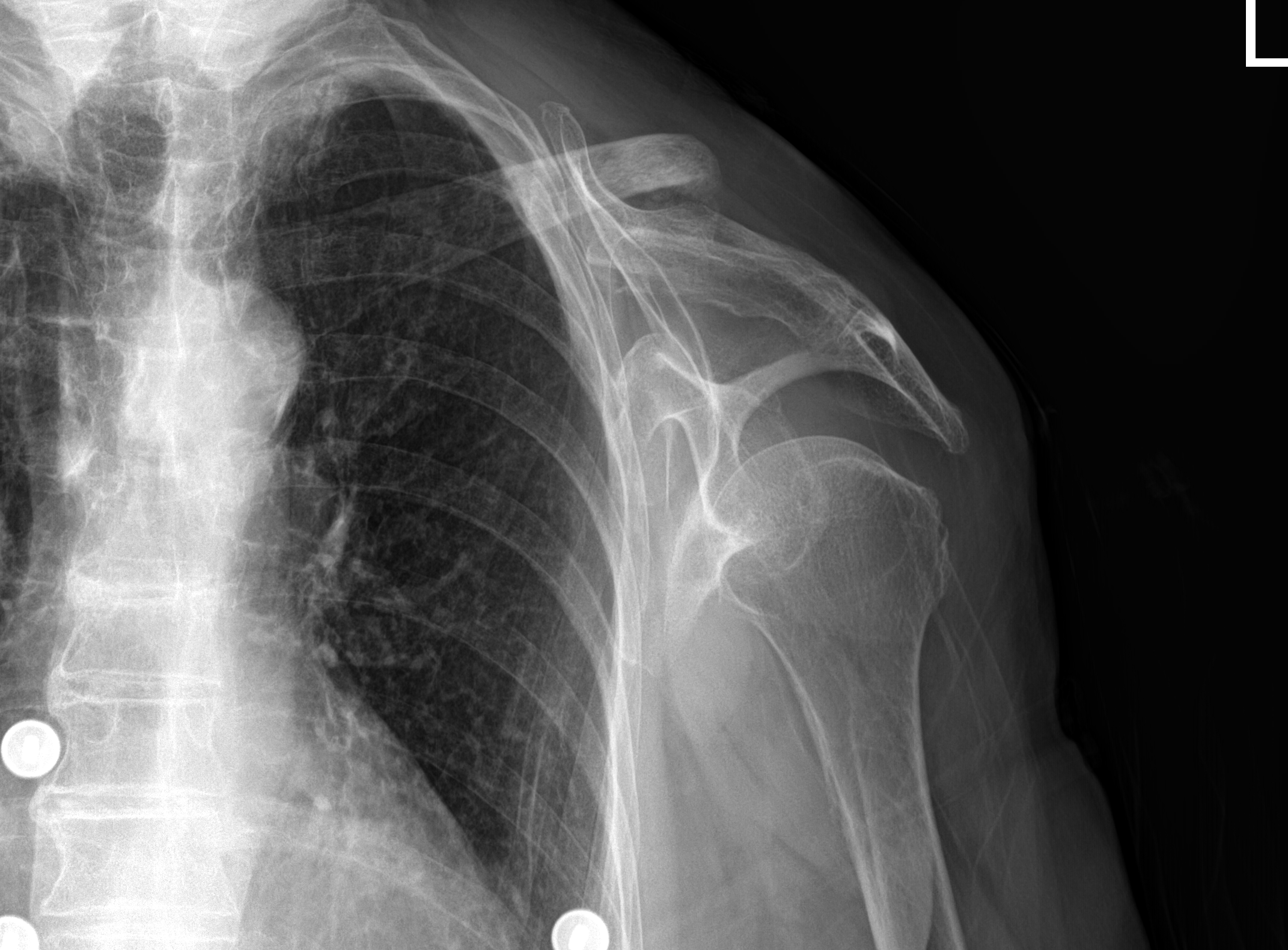

[4 of 4 positions shown; findings below may reference images not displayed]

FINDINGS: Fracture through the mid left clavicle is chronic and ununited. Some
callus formation. 2.2 cm of overriding with 12 mm inferior
displacement of distal fracture fragment. AC joint is intact. There
are multiple chronic appearing left upper rib fractures. There is
mild inferior positioning of the humeral head
IMPRESSION: 1. Old ununited left clavicle fracture
2. Mild inferior positioning of the humeral head, question related
to positioning or presence of an effusion, limited assessment for
anterior or posterior displacement due to lack of true Y-view.

## 2021-09-21 IMAGING — CT CT CERVICAL SPINE W/O CM
3 of 4 series · 13 of 33 positions shown, 16 images · non-contrast
Comparison: CT [DATE], radiograph [DATE]

CLINICAL DATA: Poly trauma fall



[Series 4: c spine soft · axial · 0.43mm/px · z∈[+1521,+1643]mm · 5 of 89 slices shown, 7 images]
[im 14/89  soft-tissue]
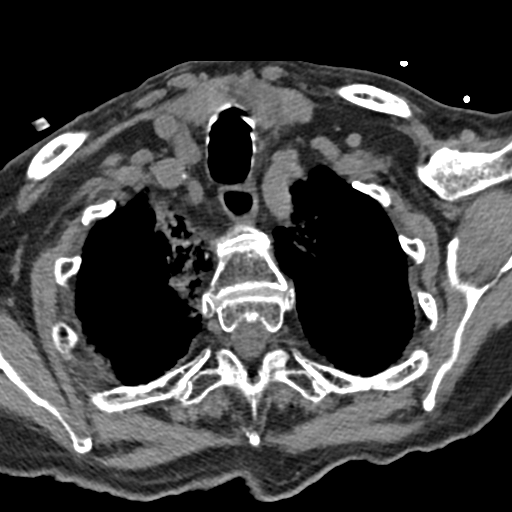
[im 14/89  bone]
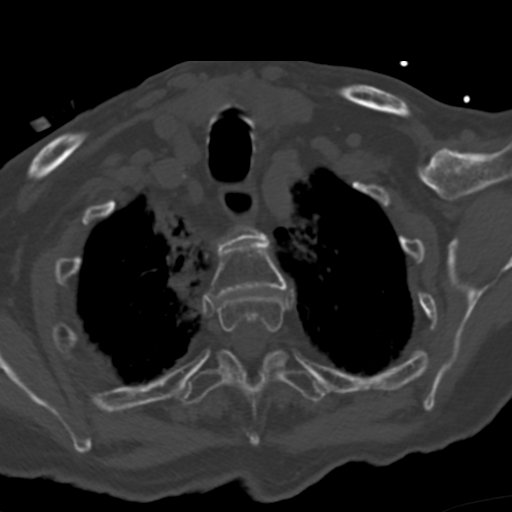
[im 28/89  bone]
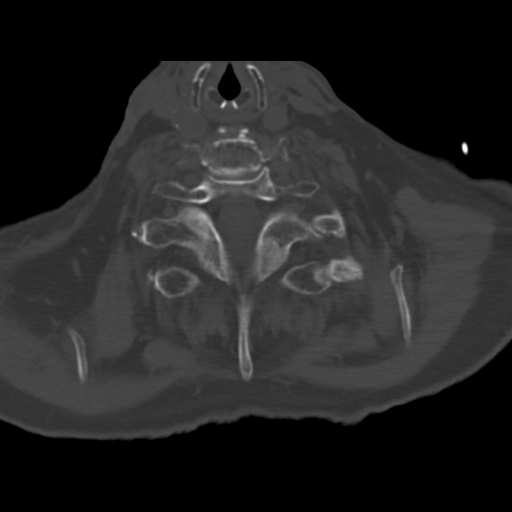
[im 48/89  bone]
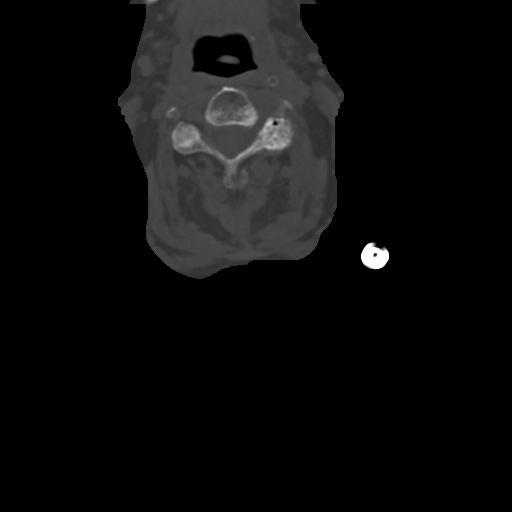
[im 61/89  bone]
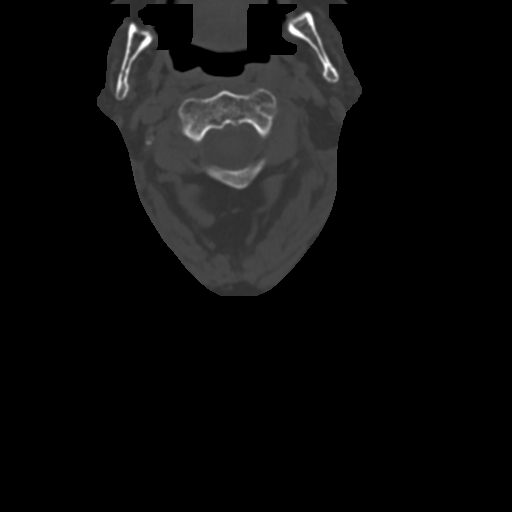
[im 75/89  soft-tissue]
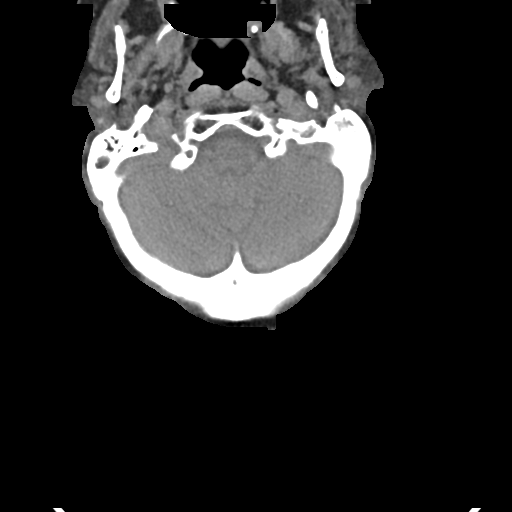
[im 75/89  bone]
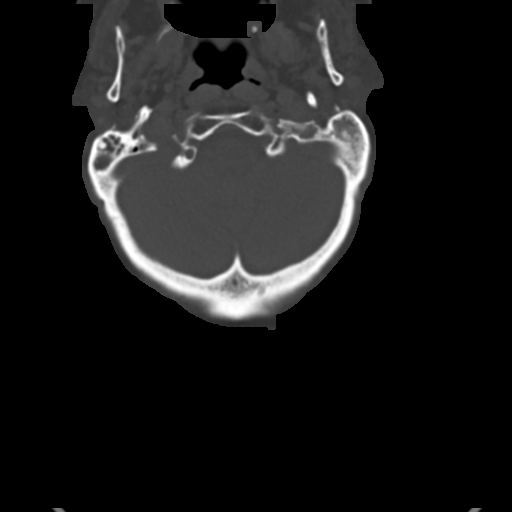

[Series 5: sagittal bone · sagittal · 0.32mm/px · 5 of 61 slices shown, 6 images]
[im 21/61  bone]
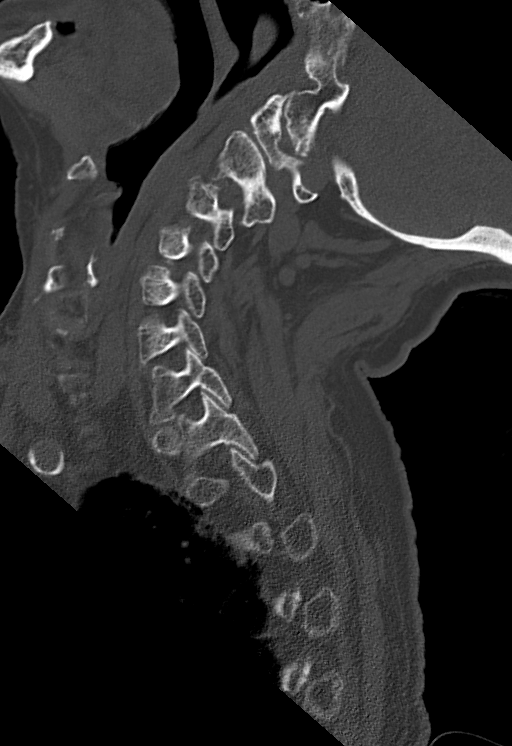
[im 26/61  bone]
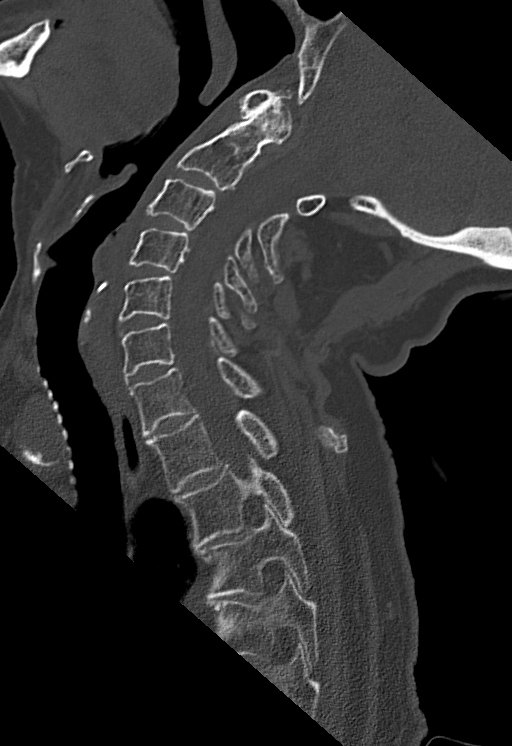
[im 31/61  soft-tissue]
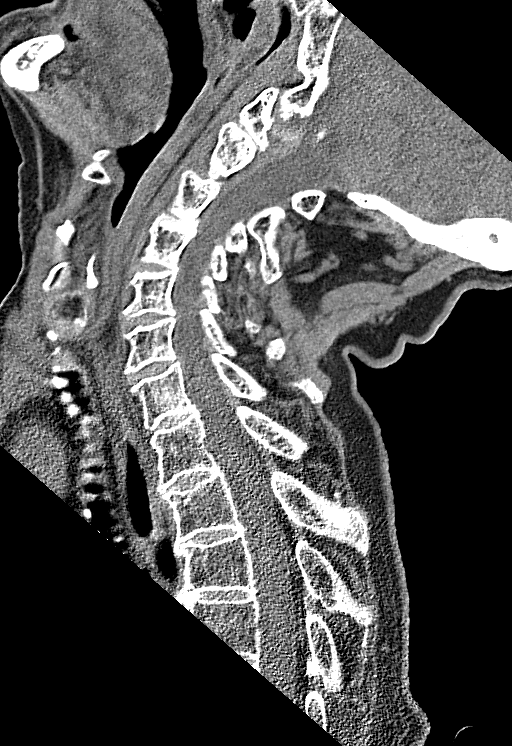
[im 31/61  bone]
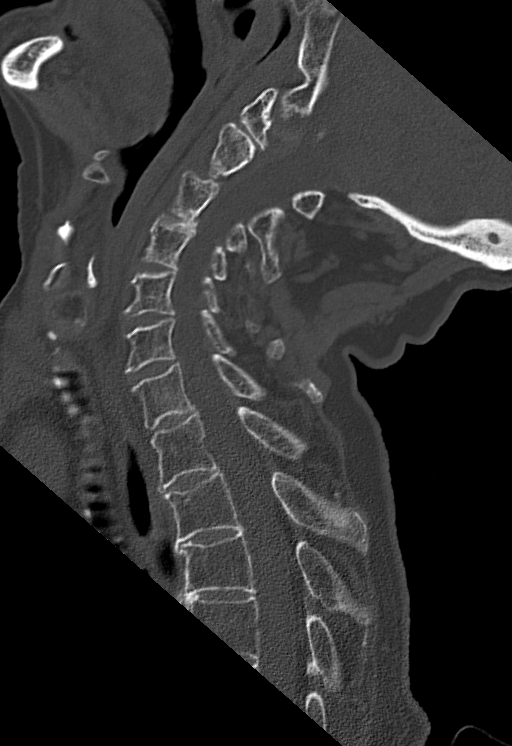
[im 36/61  bone]
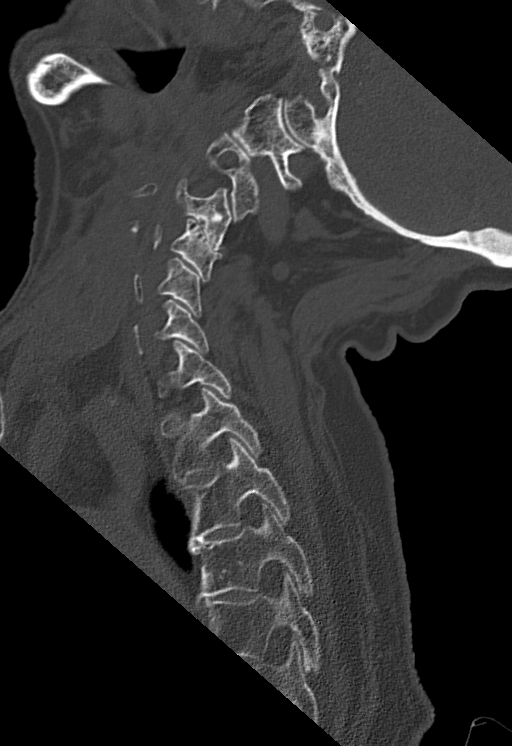
[im 41/61  bone]
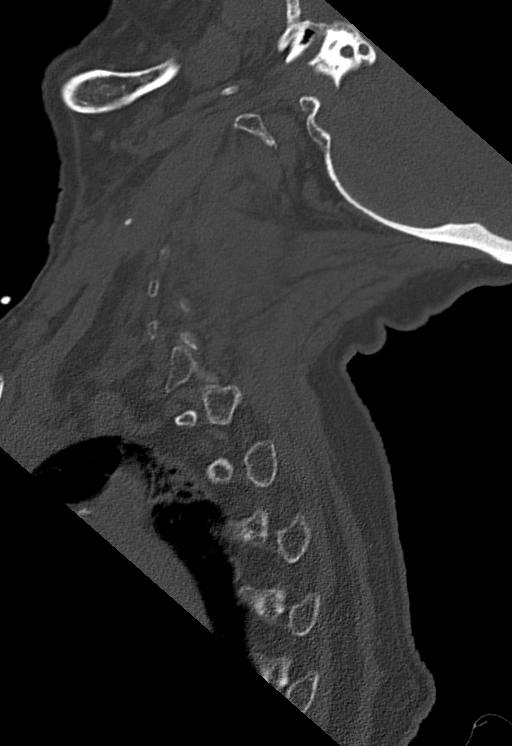

[Series 8: coronal bone · coronal · 0.30mm/px · 3 of 83 slices shown]
[im 21/83  bone]
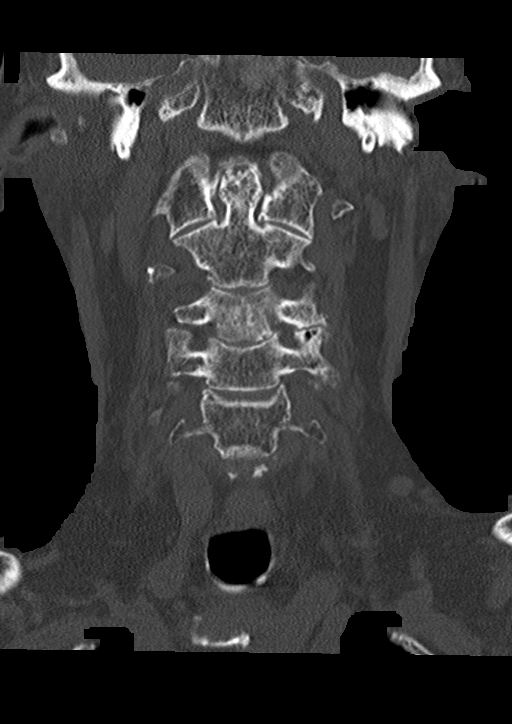
[im 42/83  bone]
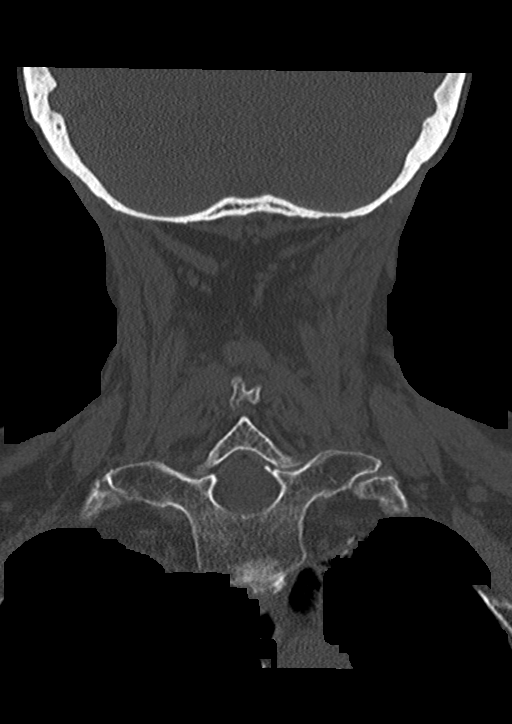
[im 62/83  bone]
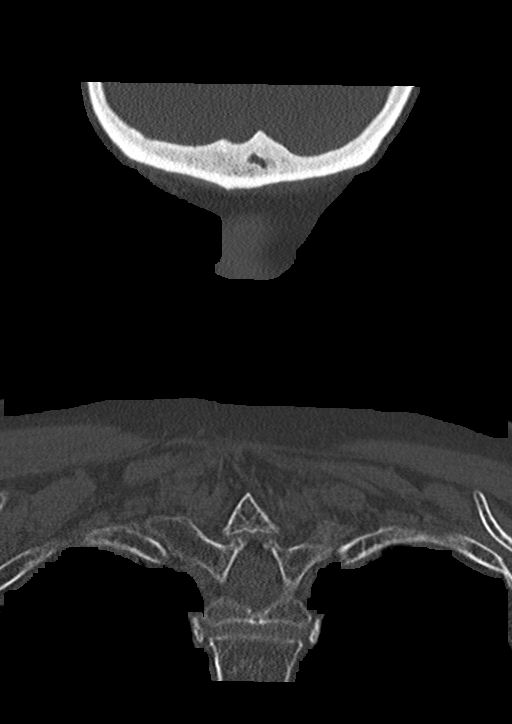

[13 of 33 positions shown; findings below may reference images not displayed]

FINDINGS: Alignment: No subluxation. Facet alignment within normal limits.
Exaggerated lordosis.

Skull base and vertebrae: No acute fracture. No primary bone lesion
or focal pathologic process.

Soft tissues and spinal canal: No prevertebral fluid or swelling. No
visible canal hematoma.

Disc levels:  Mild multilevel degenerative changes.

Upper chest: Apical fibrosis

Other: None
IMPRESSION: No acute osseous abnormality

## 2021-09-21 MED ORDER — SODIUM CHLORIDE 0.9 % IV SOLN: INTRAVENOUS | Status: DC

## 2021-09-21 NOTE — Progress Notes (Signed)
Subjective:  Patient ID: Crystal Browning, female    DOB: 1934/05/14, 88 y.o.   MRN: [id number]  Patient Care Team: [name], MD as PCP - General (Family Medicine)   Chief Complaint:  Fall (Pain in left shoulder and neck from fall /) and Altered Mental Status (Family thinks she may have UTI or be dehydrated )   HPI: Crystal Browning is a 88 y.o. female presenting on 09/21/2021 for Fall (Pain in left shoulder and neck from fall /) and Altered Mental Status (Family thinks she may have UTI or be dehydrated )   Fall Associated symptoms include headaches.  Altered Mental Status Associated symptoms include arthralgias, headaches, myalgias, neck pain and weakness. Pertinent negatives include no joint swelling.  1. Fall, initial encounter Pt fell while trying to stand out of a chair today at 1530. She describes pain of the posterior neck. She states she has blurred vision and a headache. She is currently being treated with Levaquin for a UTI. She is alert to person by not place or time.    Relevant past medical, surgical, family, and social history reviewed and updated as indicated.  Allergies and medications reviewed and updated. Data reviewed: Chart in Epic.   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 Constance Holster   TOTAL ABDOMINAL HYSTERECTOMY W/ BILATERAL SALPINGOOPHORECTOMY  1986   TUBAL LIGATION      Social History   Socioeconomic History   Marital status: Widowed    Spouse name: Not on file   Number of children: 6   Years of education: Not on file   Highest education level: Not on file  Occupational History   Not on file  Tobacco Use   Smoking status: Never   Smokeless tobacco: Never  Vaping Use   Vaping Use: Never used  Substance and Sexual Activity   Alcohol use: No   Drug use: No   Sexual activity: Not on file  Other Topics Concern   Not on file  Social History Narrative   Lives alone in her mobile home. Children live nearby - they take her to appts and help  out as needed.   Social Determinants of Health   Financial Resource Strain: Low Risk    Difficulty of Paying Living Expenses: Not hard at all  Food Insecurity: No Food Insecurity   Worried About Charity fundraiser in the Last Year: Never true   [place] in the Last Year: Never true  Transportation Needs: No Transportation Needs   Lack of Transportation (Medical): No   Lack of Transportation (Non-Medical): No  Physical Activity: Inactive   Days of Exercise per Week: 0 days   Minutes of Exercise per Session: 0 min  Stress: No Stress Concern Present   Feeling of Stress : Only a little  Social Connections: Moderately  Isolated   Frequency of Communication with Friends and Family: More than three times a week   Frequency of Social Gatherings with Friends and Family: More than three times a week   Attends Religious Services: 1 to 4 times per year   Active Member of [employer] or Organizations: No   Attends Archivist Meetings: Never   Marital Status: Widowed  Intimate Partner Violence: Not At Risk   Fear of Current or Ex-Partner: No   Emotionally Abused: No   Physically Abused: No   Sexually Abused: No    Outpatient Encounter Medications as of 09/21/2021  Medication Sig   acetaminophen (TYLENOL) 325 MG tablet Take 2 tablets (650 mg total) by mouth every 6 (six) hours as needed for mild pain, fever or headache.   apixaban (ELIQUIS) 5 MG TABS tablet Take 1 tablet (5 mg total) by mouth 2 (two) times daily.   donepezil (ARICEPT) 5 MG tablet Take 5 mg by mouth daily.   escitalopram (LEXAPRO) 10 MG tablet Take 1 tablet (10 mg total) by mouth daily.   famotidine (PEPCID) 20 MG tablet Take 1 tablet (20 mg total) by mouth every 12 (twelve) hours. (Patient taking differently: Take 20 mg by mouth daily.)   levofloxacin (LEVAQUIN) 500 MG tablet Take 1 tablet (500 mg total) by mouth daily.   levothyroxine (SYNTHROID) 50 MCG tablet Take 1 tablet (50 mcg total) by mouth daily.   metoprolol succinate (TOPROL-XL) 100 MG 24 hr tablet TAKE 1 TABLET DAILY WITH OR IMMEDIATELY FOLLOWING A MEAL.   QUEtiapine (SEROQUEL) 25 MG tablet Take 1 tablet (25 mg total) by mouth at bedtime.   solifenacin (VESICARE) 5 MG tablet Take 1 tablet (5 mg total) by mouth daily.   [DISCONTINUED] celecoxib (CELEBREX) 200 MG capsule Take 1 capsule (200 mg total) by mouth daily. With food   [DISCONTINUED] felodipine (PLENDIL) 5 MG 24 hr tablet Take 1 tablet (5 mg total) by mouth daily. For blood pressure   [DISCONTINUED] sulfamethoxazole-trimethoprim (BACTRIM DS) 800-160 MG tablet Take 1 tablet by mouth 2 (two) times daily.   No  facility-administered encounter medications on file as of 09/21/2021.    Allergies  Allergen Reactions   Amlodipine Besylate Hives    Hives and confusion.   Tuberculin Tests Swelling   Claritin [Loratadine] Rash    Pt's daughter states she is allergic to claritin   Paroxetine Hcl Rash   Vit D-Vit E-Safflower Oil     Review of Systems  Eyes:  Positive for visual disturbance.  Musculoskeletal:  Positive for arthralgias, back pain, gait problem, myalgias, neck pain and neck stiffness. Negative for joint swelling.  Neurological:  Positive for weakness and headaches.  Psychiatric/Behavioral:  Positive for confusion.   All other systems reviewed and are negative.  Objective:  BP (!) 189/64    Pulse 66    Temp 98 F (36.7 C)    Resp 20    Ht '5\' 1"'$  (1.549 m)    Wt 56.7 kg    SpO2 98%    BMI 23.62 kg/m    Wt Readings from Last 3 Encounters:  09/21/21 56.7 kg  09/14/21 56.7 kg  06/28/21 56.8 kg    Physical Exam Vitals and nursing note reviewed.  Constitutional:      Appearance: She is normal weight.     Comments: Frail elderly  HENT:     Head: Normocephalic and atraumatic.  Eyes:     Pupils: Pupils are equal, round, and reactive to light.  Cardiovascular:     Rate and Rhythm: Normal rate and regular rhythm.     Heart sounds: Normal heart sounds.  Pulmonary:     Effort: Pulmonary effort is normal.     Breath sounds: Normal breath sounds.  Musculoskeletal:     Cervical back: Tenderness present. Pain with movement present.     Right lower leg: Edema present.     Left lower leg: Edema present.  Skin:    General: Skin is warm and dry.     Capillary Refill: Capillary refill takes less than 2 seconds.  Neurological:     Mental Status: She is disoriented and confused.     Motor: Weakness present.     Gait: Gait abnormal.    Results for orders placed or performed in visit on 09/14/21  Urine Culture   Specimen: Urine   UR  Result Value Ref Range   Urine Culture,  Routine Final report (A)    Organism ID, Bacteria Escherichia coli (A)    ORGANISM ID, BACTERIA Comment    Antimicrobial Susceptibility Comment   Microscopic Examination   Urine  Result Value Ref Range   WBC, UA 0-5 0 - 5 /hpf   RBC 0-2 0 - 2 /hpf   Epithelial Cells (non renal) 0-10 0 - 10 /hpf   Renal Epithel, UA None seen None seen /hpf   Bacteria, UA Few (A) None seen/Few  Urinalysis, Complete  Result Value Ref Range   Specific Gravity, UA 1.020 1.005 - 1.030   pH, UA 5.0 5.0 - 7.5   Color, UA Yellow Yellow   Appearance Ur Clear Clear   Leukocytes,UA Trace (A) Negative   Protein,UA Trace (A) Negative/Trace   Glucose, UA Negative Negative   Ketones, UA Trace (A) Negative   RBC, UA Negative Negative   Bilirubin, UA Negative Negative   Urobilinogen, Ur 0.2 0.2 - 1.0 mg/dL   Nitrite, UA Negative Negative   Microscopic Examination See below:        Pertinent labs & imaging results that were available during my care of the patient were reviewed by me and considered in my medical decision making.  Assessment & Plan:  [place] was seen today for fall and altered mental status.  Diagnoses and all orders for this visit:  Fall, initial encounter -     DG Cervical Spine 2 or 3 views; Future. Stat read negative, further imaging recommended.  -     DG Shoulder Left; Future -     Urinalysis Acute pain of left shoulder -     DG Shoulder Left; Future Neck pain -     DG Cervical Spine 2 or 3 views; Future Weakness -     Urinalysis   Patient hypertensive, confused, disoriented, with pain of  the cervical neck after fall today. Currently treated with Levaquin for UTI.  Will send to [facility] for further evaluation and additional imaging.      Continue all other maintenance medications.  Follow up plan: To ED for further evaluation. Attempted to call ED for report, placed on hold for several minutes.   Continue healthy lifestyle choices, including diet (rich in fruits, vegetables, and  lean proteins, and low in salt and simple carbohydrates) and exercise (at least 30 minutes of moderate physical activity daily).    The above assessment and management plan was discussed with the patient. The patient verbalized understanding of and has agreed to the management plan. Patient is aware to call the clinic if they develop any new symptoms or if symptoms persist or worsen. Patient is aware when to return to the clinic for a follow-up visit. Patient educated on when it is appropriate to go to the emergency department.   Addison Yeng Perz, NP-S  I personally was present during the history, physical exam, and medical decision-making activities of this visit and have verified that the services and findings are accurately documented in the nurse practitioner student's note.  Monia Pouch, FNP-C [facility] [address] [place], [place] [address] [phone]

## 2021-09-21 NOTE — ED Notes (Signed)
Pt fell at home hit the back of her head, is on Eliquis. States that she has had a Uti x 2 weeks now, just started on a 2nd ABX ?

## 2021-09-21 NOTE — ED Triage Notes (Signed)
Fell at home 1530 today multiple complaints, family suspect UTI ?

## 2021-09-21 NOTE — ED Provider Notes (Signed)
[facility] EMERGENCY DEPARTMENT Provider Note   CSN: [id number] Arrival date & time: 09/21/21  1725     History  Chief Complaint  Patient presents with   Lytle Michaels    Crystal Browning is a 88 y.o. female.  Patient was at [facility] earlier today.  Patient fell at home at about 1530.  They did x-rays of her left shoulder.  Did according to family may be an x-ray of the neck.  But I am not able to see that.  So maybe they did not do that.  Patient with a fall did hit her head.  Does have some posterior neck pain.  Does have some discomfort to the proximal left shoulder.  I reviewed the x-ray of that had no acute findings.  In addition patient is followed by them for hypertension.  Patient also was seen earlier in the week for probable urinary tract infection.  Had a urinalysis done with culture.  And was contacted within the last 2 days and they switched antibiotics.  In addition there is been some changes in her blood pressure medicine where they have been decreasing that.  Blood pressures are high today.'s upon arrival was 176/73 according to notes patient is on Eliquis the blood thinner and that is for history of atrial fibrillation.  Based on looking at her meds that are listed here the only blood pressure medicine would be the Toprol XL.  Patient states that she fell by tripping.  Past medical history is significant for hypertension history of a TIA diverticulosis atrial fibrillation esophageal stricture history of dementia community-acquired pneumonia hypothyroidism and as mentioned atrial fibrillation.      Home Medications Prior to Admission medications   Medication Sig Start Date End Date Taking? Authorizing Provider  acetaminophen (TYLENOL) 325 MG tablet Take 2 tablets (650 mg total) by mouth every 6 (six) hours as needed for mild pain, fever or headache. 10/07/19  Yes Emokpae, Courage, MD  apixaban (ELIQUIS) 5 MG TABS tablet Take 1 tablet (5 mg total) by mouth 2  (two) times daily. 06/28/21  Yes Stacks, Cletus Gash, MD  donepezil (ARICEPT) 5 MG tablet Take 5 mg by mouth daily. 03/08/21  Yes [provider]  escitalopram (LEXAPRO) 10 MG tablet Take 1 tablet (10 mg total) by mouth daily. 06/28/21  Yes [name], MD  famotidine (PEPCID) 20 MG tablet Take 1 tablet (20 mg total) by mouth every 12 (twelve) hours. Patient taking differently: Take 20 mg by mouth daily. 03/30/20  Yes Stacks, Cletus Gash, MD  levofloxacin (LEVAQUIN) 500 MG tablet Take 1 tablet (500 mg total) by mouth daily. 09/19/21  Yes Stacks, Cletus Gash, MD  levothyroxine (SYNTHROID) 50 MCG tablet Take 1 tablet (50 mcg total) by mouth daily. 06/28/21  Yes Stacks, Cletus Gash, MD  metoprolol succinate (TOPROL-XL) 100 MG 24 hr tablet TAKE 1 TABLET DAILY WITH OR IMMEDIATELY FOLLOWING A MEAL. 06/28/21  Yes [name], MD  OVER THE COUNTER MEDICATION Vitamin b 12   Yes [provider]  QUEtiapine (SEROQUEL) 25 MG tablet Take 1 tablet (25 mg total) by mouth at bedtime. 06/28/21  Yes Stacks, Cletus Gash, MD  solifenacin (VESICARE) 5 MG tablet Take 1 tablet (5 mg total) by mouth daily. 09/14/21 09/14/22 Yes Stacks, Cletus Gash, MD  vitamin B-12 (CYANOCOBALAMIN) 1000 MCG tablet Take 1,000 mcg by mouth daily.   Yes [provider]      Allergies    Amlodipine besylate, Tuberculin tests, Claritin [loratadine], Paroxetine hcl, and Vit d-vit e-safflower oil  Review of Systems   Review of Systems  Constitutional:  Negative for chills and fever.  HENT:  Negative for ear pain and sore throat.   Eyes:  Negative for pain and visual disturbance.  Respiratory:  Negative for cough and shortness of breath.   Cardiovascular:  Negative for chest pain and palpitations.  Gastrointestinal:  Negative for abdominal pain and vomiting.  Genitourinary:  Negative for dysuria and hematuria.  Musculoskeletal:  Positive for neck pain. Negative for arthralgias and back pain.  Skin:  Negative for color change and rash.   Neurological:  Positive for headaches. Negative for seizures and syncope.  All other systems reviewed and are negative.  Physical Exam Updated Vital Signs BP (!) 174/79    Pulse 66    Temp 98.1 F (36.7 C) (Oral)    Resp 17    SpO2 95%  Physical Exam Vitals and nursing note reviewed.  Constitutional:      General: She is not in acute distress.    Appearance: Normal appearance. She is well-developed.  HENT:     Head: Normocephalic and atraumatic.  Eyes:     Extraocular Movements: Extraocular movements intact.     Conjunctiva/sclera: Conjunctivae normal.     Pupils: Pupils are equal, round, and reactive to light.  Neck:     Comments: Some mild tenderness to posterior palpitation to the cervical spine. Cardiovascular:     Rate and Rhythm: Normal rate and regular rhythm.     Heart sounds: No murmur heard. Pulmonary:     Effort: Pulmonary effort is normal. No respiratory distress.     Breath sounds: Normal breath sounds.  Abdominal:     Palpations: Abdomen is soft.     Tenderness: There is no abdominal tenderness.  Musculoskeletal:        General: Tenderness present. No swelling.     Cervical back: Normal range of motion and neck supple. Tenderness present.     Comments: Some tenderness to to the proximal left shoulder but no deformity.  Good movement of both arms.  Patient is able to hold them up.  Good movement at the elbow wrist and fingers bilaterally.  Radial pulses 2+.  Good cap refill and sensations intact distally.  No pain with movement of lower extremities.  No concerns for any hip injury.  Skin:    General: Skin is warm and dry.     Capillary Refill: Capillary refill takes less than 2 seconds.  Neurological:     General: No focal deficit present.     Mental Status: She is alert and oriented to person, place, and time.     Cranial Nerves: No cranial nerve deficit.     Sensory: No sensory deficit.     Motor: No weakness.  Psychiatric:        Mood and Affect: Mood  normal.    ED Results / Procedures / Treatments   Labs (all labs ordered are listed, but only abnormal results are displayed) Labs Reviewed  COMPREHENSIVE METABOLIC PANEL - Abnormal; Notable for the following components:      Result Value   Glucose, Bld 106 (*)    Creatinine, Ser 1.04 (*)    GFR, Estimated 53 (*)    All other components within normal limits  URINALYSIS, ROUTINE W REFLEX MICROSCOPIC - Abnormal; Notable for the following components:   APPearance CLOUDY (*)    All other components within normal limits  CBC WITH DIFFERENTIAL/PLATELET    EKG EKG Interpretation  Date/Time:  Thursday September 21 2021 19:28:08 EST Ventricular Rate:  83 PR Interval:    QRS Duration: 117 QT Interval:  408 QTC Calculation: 421 R Axis:   -62 Text Interpretation: Atrial fibrillation Incomplete right bundle branch block LVH with IVCD, LAD and secondary repol abnrm No significant change since last tracing Confirmed by Fredia Sorrow [phone]) on 09/21/2021 7:40:08 PM  Radiology DG Chest 1 View  Result Date: 09/21/2021 CLINICAL DATA:  Fall EXAM: CHEST  1 VIEW COMPARISON:  08/28/2020, 10/06/2019, clavicle x-ray 08/07/2020 FINDINGS: Mild cardiomegaly. No focal opacity, pleural effusion, or pneumothorax. Right greater than left apical pleural and parenchymal scarring. Multiple chronic bilateral rib fractures. Chronic ununited left mid clavicle fracture. IMPRESSION: No active disease.  Mild cardiomegaly. Electronically Signed   By: Donavan Foil M.D.   On: 09/21/2021 19:58   DG Cervical Spine 2 or 3 views  Result Date: 09/21/2021 CLINICAL DATA:  Left shoulder and neck pain, fall EXAM: CERVICAL SPINE - 2-3 VIEW COMPARISON:  Cervical spine radiographs 11/02/2020 FINDINGS: The cervical spine is imaged through the C7 vertebral body in the lateral projection. The bones are osteopenic. Vertebral body heights are preserved, without definite evidence of acute injury. There is trace grade 1 anterolisthesis of C5  on C6, likely degenerative in nature. There is no evidence of traumatic malalignment. The disc heights are overall preserved. There is mild multilevel degenerative endplate change. The prevertebral soft tissues are unremarkable. IMPRESSION: No definite evidence of acute injury in the cervical spine. If there is persistent clinical concern, cross-sectional imaging is recommended. Electronically Signed   By: Valetta Mole M.D.   On: 09/21/2021 16:46   CT Head Wo Contrast  Result Date: 09/21/2021 CLINICAL DATA:  Head trauma EXAM: CT HEAD WITHOUT CONTRAST TECHNIQUE: Contiguous axial images were obtained from the base of the skull through the vertex without intravenous contrast. RADIATION DOSE REDUCTION: This exam was performed according to the departmental dose-optimization program which includes automated exposure control, adjustment of the mA and/or kV according to patient size and/or use of iterative reconstruction technique. COMPARISON:  04/29/2021 FINDINGS: Brain: No acute territorial infarction, hemorrhage, or intracranial mass. Suttles matter hypodensity likely chronic small vessel ischemic change. Mild atrophy. Stable ventricle size Vascular: No hyperdense vessels. Vertebral and carotid vascular calcification Skull: Trace fluid right mastoid.  No fracture Sinuses/Orbits: No acute finding. Other: None IMPRESSION: 1. No CT evidence for acute intracranial abnormality. 2. Mild atrophy and chronic small vessel ischemic changes. Electronically Signed   By: Donavan Foil M.D.   On: 09/21/2021 19:49   CT Cervical Spine Wo Contrast  Result Date: 09/21/2021 CLINICAL DATA:  Poly trauma fall EXAM: CT CERVICAL SPINE WITHOUT CONTRAST TECHNIQUE: Multidetector CT imaging of the cervical spine was performed without intravenous contrast. Multiplanar CT image reconstructions were also generated. RADIATION DOSE REDUCTION: This exam was performed according to the departmental dose-optimization program which includes automated  exposure control, adjustment of the mA and/or kV according to patient size and/or use of iterative reconstruction technique. COMPARISON:  CT 07/18/2020, radiograph 09/21/2021 FINDINGS: Alignment: No subluxation. Facet alignment within normal limits. Exaggerated lordosis. Skull base and vertebrae: No acute fracture. No primary bone lesion or focal pathologic process. Soft tissues and spinal canal: No prevertebral fluid or swelling. No visible canal hematoma. Disc levels:  Mild multilevel degenerative changes. Upper chest: Apical fibrosis Other: None IMPRESSION: No acute osseous abnormality Electronically Signed   By: Donavan Foil M.D.   On: 09/21/2021 20:02   DG Shoulder Left  Result Date: 09/21/2021 CLINICAL DATA:  Shoulder and neck pain fall EXAM: LEFT SHOULDER - 2+ VIEW COMPARISON:  10/11/2020 FINDINGS: Fracture through the mid left clavicle is chronic and ununited. Some callus formation. 2.2 cm of overriding with 12 mm inferior displacement of distal fracture fragment. AC joint is intact. There are multiple chronic appearing left upper rib fractures. There is mild inferior positioning of the humeral head IMPRESSION: 1. 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 Mild inferior positioning of the humeral head, question related to positioning or presence of an effusion, limited assessment for anterior or posterior displacement due to lack of true Y-view. Electronically Signed   By: Donavan Foil M.D.   On: 09/21/2021 20:02    Procedures Procedures    Medications Ordered in ED Medications  0.9 %  sodium chloride infusion (0 mLs Intravenous Stopped 09/22/21 0044)    ED Course/ Medical Decision Making/ A&P                           Medical Decision Making Amount and/or Complexity of Data Reviewed Labs: ordered. Radiology: ordered.  Risk Prescription drug management.   Patient has a dementia but the daughter able to answer most of the review of system questions.  It seems that there is concerned  about head injury posterior neck injury reviewed the x-ray of the left shoulder area.  With no acute abnormalities.  Patient clinically without any injury.  Did do chest x-ray that looked at the chest and both shoulders were negative.  No acute findings on the chest x-ray.  Head CT CT of the neck without any acute findings.  CBC [place] blood cell count 5.3 hemoglobin 93.5 complete metabolic panel without any significant abnormalities GFR was 53.  Creatinine up a little bit at 1.04.  Urinalysis here was cloudy.  But leukocytes were negative.  So it appears that the antibiotic that she is being treated with currently is taking care of the urinary tract infection.  Patient's blood pressure is elevated here.  She is taking her medications.  We will have her follow back up with her primary care provider for further management of the blood pressure.  No acute intervention required here tonight.   Final Clinical Impression(s) / ED Diagnoses Final diagnoses:  Fall, initial encounter  Injury of head, initial encounter  Cervical strain, acute, initial encounter  Primary hypertension    Rx / DC Orders ED Discharge Orders     None         Fredia Sorrow, MD 09/22/21 [phone]

## 2021-09-21 NOTE — ED Triage Notes (Signed)
Patient has already had xrays at PCP office today ?

## 2021-09-22 LAB — URINALYSIS, ROUTINE W REFLEX MICROSCOPIC
Bilirubin Urine: NEGATIVE
Glucose, UA: NEGATIVE mg/dL
Hgb urine dipstick: NEGATIVE
Ketones, ur: NEGATIVE mg/dL
Leukocytes,Ua: NEGATIVE
Nitrite: NEGATIVE
Protein, ur: NEGATIVE mg/dL
Specific Gravity, Urine: 1.017 (ref 1.005–1.030)
pH: 7 (ref 5.0–8.0)

## 2021-09-22 NOTE — Discharge Instructions (Signed)
Continue your antibiotic for the urinary tract infection.  Follow-up with your primary care doctor to have your urine rechecked to make sure that that is clearing since they just switched antibiotics just the other day.  Today's work-up head CT CT neck chest x-ray without any acute findings. ? ?Blood pressure is elevated here tonight.  Need to follow-up closely with your primary care doctor for any adjustments.  We do not want to leave at this high long-term.  But it is okay for the short run. ?

## 2021-09-22 NOTE — ED Notes (Signed)
Pt given water per MD ok ?

## 2021-09-25 DIAGNOSIS — Z923 Personal history of irradiation: Secondary | ICD-10-CM | POA: Diagnosis not present

## 2021-09-25 DIAGNOSIS — Z8521 Personal history of malignant neoplasm of larynx: Secondary | ICD-10-CM | POA: Diagnosis not present

## 2021-09-26 ENCOUNTER — Telehealth: Payer: Self-pay | Admitting: Family Medicine

## 2021-09-26 NOTE — Telephone Encounter (Signed)
Spoke with daughter, ER follow up appointment scheduled for 09/28/21. ?

## 2021-09-28 ENCOUNTER — Ambulatory Visit (INDEPENDENT_AMBULATORY_CARE_PROVIDER_SITE_OTHER): Payer: Medicare Other | Admitting: Family Medicine

## 2021-09-28 ENCOUNTER — Encounter: Payer: Self-pay | Admitting: Family Medicine

## 2021-09-28 VITALS — BP 136/59 | HR 64 | Temp 97.6°F | Ht 61.0 in | Wt 121.8 lb

## 2021-09-28 DIAGNOSIS — F03B18 Unspecified dementia, moderate, with other behavioral disturbance: Secondary | ICD-10-CM

## 2021-09-28 DIAGNOSIS — R441 Visual hallucinations: Secondary | ICD-10-CM | POA: Diagnosis not present

## 2021-09-28 DIAGNOSIS — N3090 Cystitis, unspecified without hematuria: Secondary | ICD-10-CM

## 2021-09-28 DIAGNOSIS — R296 Repeated falls: Secondary | ICD-10-CM | POA: Diagnosis not present

## 2021-09-28 LAB — URINALYSIS, COMPLETE
Bilirubin, UA: NEGATIVE
Glucose, UA: NEGATIVE
Ketones, UA: NEGATIVE
Microscopic Examination: NEGATIVE
Nitrite, UA: NEGATIVE
Protein,UA: NEGATIVE
RBC, UA: NEGATIVE
Specific Gravity, UA: 1.01 (ref 1.005–1.030)
Urobilinogen, Ur: 0.2 mg/dL (ref 0.2–1.0)
pH, UA: 6 (ref 5.0–7.5)

## 2021-09-28 LAB — MICROSCOPIC EXAMINATION
Bacteria, UA: NONE SEEN
RBC, Urine: NONE SEEN /hpf (ref 0–2)
Renal Epithel, UA: NONE SEEN /hpf

## 2021-09-28 MED ORDER — QUETIAPINE FUMARATE 50 MG PO TABS: 50.0000 mg | ORAL_TABLET | Freq: Every day | ORAL | 2 refills | Status: DC

## 2021-09-28 NOTE — Progress Notes (Signed)
? ?Subjective:  ?Patient ID: Crystal Browning, female    DOB: [birth year]  Age: 88 y.o. MRN: [id number] ? ?CC: No chief complaint on file. ? ? ?HPI ?Crystal Browning presents for falling out of her chair last week. She slid on th floor. Concerned about falls. No injury noted.  ? ?Depression screen [employer] 2/9 09/28/2021 09/28/2021 09/21/2021  ?Decreased Interest 2 0 2  ?Down, Depressed, Hopeless 2 0 0  ?PHQ - 2 Score 4 0 2  ?Altered sleeping 2 - 0  ?Tired, decreased energy 2 - 2  ?Change in appetite 2 - 0  ?Feeling bad or failure about yourself  3 - 0  ?Trouble concentrating 2 - 0  ?Moving slowly or fidgety/restless 2 - 0  ?Suicidal thoughts 0 - 0  ?PHQ-9 Score 17 - 4  ?Difficult doing work/chores Somewhat difficult - Somewhat difficult  ?Some recent data might be hidden  ? ?GAD 7 : Generalized Anxiety Score 09/28/2021 06/05/2021 01/04/2021 01/25/2016  ?Nervous, Anxious, on Edge 3 0 1 1  ?Control/stop worrying '3 1 1 3  '$ ?Worry too much - different things '3 1 1 3  '$ ?Trouble relaxing 3 1 0 0  ?Restless 2 0 0 0  ?Easily annoyed or irritable 2 - 1 0  ?Afraid - awful might happen 3 1 0 2  ?Total GAD 7 Score 19 - 4 9  ?Anxiety Difficulty Not difficult at all Somewhat difficult Not difficult at all -  ? ? ? ? 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  ? ?She has a past surgical history that includes Direct laryngoscopy (03/27/2005); Tubal ligation; Total abdominal hysterectomy w/ bilateral salpingoophorectomy (1986);  Cataract extraction, bilateral; and Cardioversion (N/A, 11/23/2016).  ? ?Her family history includes Alcohol abuse in her brother; COPD in her brother; Cancer (age of onset: 33) in her father; Heart disease (age of onset: 46) in her mother; Stroke in her mother.She reports that she has never smoked. She has never used smokeless tobacco. She reports that she does not drink alcohol and does not use drugs. ? ? ? ?ROS ?Review of Systems  ?Constitutional: Negative.   ?HENT: Negative.    ?Eyes:  Negative for visual disturbance.  ?Respiratory:  Negative for shortness of breath.   ?Cardiovascular:  Negative for chest pain.  ?Gastrointestinal:  Negative for abdominal pain.  ?Musculoskeletal:  Negative for arthralgias.  ?Psychiatric/Behavioral:  Positive for confusion and dysphoric mood. The patient is nervous/anxious.   ? ?Objective:  ?BP (!) 136/59   Pulse 64   Temp 97.6 ?F (36.4 ?C)   Ht '5\' 1"'$  (1.549 m)   Wt 121 lb 12.8 oz (55.2 kg)   SpO2 98%   BMI 23.01 kg/m?  ? ?BP Readings from Last 3 Encounters:  ?09/28/21 (!) 136/59  ?09/22/21 (!) 174/79  ?09/21/21 (!) 189/64  ? ? ?Wt Readings from Last 3 Encounters:  ?09/28/21 121 lb 12.8 oz (55.2 kg)  ?09/21/21 125 lb (56.7 kg)  ?09/14/21 125 lb (56.7 kg)  ? ? ? ?Physical Exam ? ? ? ?Assessment & Plan:  ? ?Diagnoses and all orders for this visit: ? ?Cystitis ?-     Urinalysis, Complete ?-  Urine Culture ? ?Hallucinations, visual ?-     QUEtiapine (SEROQUEL) 50 MG tablet; Take 1 tablet (50 mg total) by mouth at bedtime. ? ?Falling episodes ?-     DME Bedside commode ? ?Moderate dementia with behavioral disturbance ? ?Other orders ?-     Microscopic Examination ? ? ? ? ? ? ?I have discontinued Crystal Browning's levofloxacin. I have also changed her QUEtiapine. Additionally, [name] maintain her acetaminophen, famotidine, donepezil, apixaban, escitalopram, levothyroxine, metoprolol succinate, solifenacin, OVER THE COUNTER MEDICATION, and vitamin B-12. ? ?Allergies as of  09/28/2021   ? ?   Reactions  ? Amlodipine Besylate Hives  ? Hives and confusion.  ? Tuberculin Tests Swelling  ? Claritin [loratadine] Rash  ? Pt's daughter states she is allergic to claritin  ? Paroxetine Hcl Rash  ? Vit D-vit E-safflower Oil   ? ?  ? ?  ?Medication List  ?  ? ?  ? Accurate as of September 28, 2021 11:59 PM. If you have any questions, ask your nurse or doctor.  ?  ?  ? ?  ? ?STOP taking these medications   ? ?levofloxacin 500 MG tablet ?Commonly known as: LEVAQUIN ?Stopped by: [name], MD ?  ? ?  ? ?TAKE these medications   ? ?acetaminophen 325 MG tablet ?Commonly known as: TYLENOL ?Take 2 tablets (650 mg total) by mouth every 6 (six) hours as needed for mild pain, fever or headache. ?  ?apixaban 5 MG Tabs tablet ?Commonly known as: Eliquis ?Take 1 tablet (5 mg total) by mouth 2 (two) times daily. ?  ?donepezil 5 MG tablet ?Commonly known as: ARICEPT ?Take 5 mg by mouth daily. ?  ?escitalopram 10 MG tablet ?Commonly known as: LEXAPRO ?Take 1 tablet (10 mg total) by mouth daily. ?  ?famotidine 20 MG tablet ?Commonly known as: PEPCID ?Take 1 tablet (20 mg total) by mouth every 12 (twelve) hours. ?What changed: when to take this ?  ?levothyroxine 50 MCG tablet ?Commonly known as: SYNTHROID ?Take 1 tablet (50 mcg total) by mouth daily. ?  ?metoprolol succinate 100 MG 24 hr tablet ?Commonly known as: TOPROL-XL ?TAKE 1 TABLET DAILY WITH OR IMMEDIATELY FOLLOWING A MEAL. ?  ?OVER THE COUNTER MEDICATION ?Vitamin b 12 ?  ?QUEtiapine 50 MG tablet ?Commonly known as: SEROQUEL ?Take 1 tablet (50 mg total) by mouth at bedtime. ?What changed:  ?medication strength ?how much to take ?Changed by: [name], MD ?  ?solifenacin 5 MG tablet ?Commonly known as: VESICARE ?Take 1 tablet (5 mg total) by mouth daily. ?  ?vitamin B-12 1000 MCG tablet ?Commonly known as: CYANOCOBALAMIN ?Take 1,000 mcg by mouth daily. ?  ? ?  ? ?  ?  ? ? ?  ?Durable Medical Equipment  ?(From admission, onward)  ?  ? ? ?  ? ?  Start      Ordered  ? 09/28/21 0000  DME Bedside commode       ?Question:  Patient needs a bedside commode to treat with the following condition  Answer:  Imbalance  ? 09/28/21 1643  ? ?  ?  ? ?  ? ? ? ?Follow-up: Return in about 6 weeks (around 11/09/2021). ? ?[name], M.D. ?

## 2021-09-30 ENCOUNTER — Encounter: Payer: Self-pay | Admitting: Family Medicine

## 2021-09-30 LAB — URINE CULTURE

## 2021-09-30 NOTE — Progress Notes (Signed)
Hello Crystal Browning,  Your lab result is normal and/or stable.Some minor variations that are not significant are commonly marked abnormal, but do not represent any medical problem for you.  Best regards, Janely Gullickson, M.D.

## 2021-10-04 ENCOUNTER — Other Ambulatory Visit: Payer: Self-pay | Admitting: Family Medicine

## 2021-10-04 ENCOUNTER — Telehealth: Payer: Self-pay | Admitting: Family Medicine

## 2021-10-04 DIAGNOSIS — R296 Repeated falls: Secondary | ICD-10-CM

## 2021-10-04 DIAGNOSIS — R531 Weakness: Secondary | ICD-10-CM

## 2021-10-04 NOTE — Telephone Encounter (Signed)
Order printed. Please fax to [facility] ?

## 2021-10-05 NOTE — Telephone Encounter (Signed)
Faxed as requested

## 2021-10-21 ENCOUNTER — Other Ambulatory Visit: Payer: Self-pay | Admitting: Family Medicine

## 2021-10-21 DIAGNOSIS — R441 Visual hallucinations: Secondary | ICD-10-CM

## 2021-11-15 ENCOUNTER — Telehealth: Payer: Self-pay

## 2021-11-15 NOTE — Chronic Care Management (AMB) (Signed)
  Chronic Care Management   Outreach Note  11/15/2021 Name: RAMIYA DELAHUNTY MRN: [id number] DOB: 09-21-1933  VERONIKA HEARD is a 88 y.o. year old female who is a primary care patient of Stacks, Cletus Gash, MD. I reached out to [name] by phone today in response to a referral sent by Ms. Orlene Plum Batzel's primary care provider.  An unsuccessful telephone outreach was attempted today. The patient was referred to the case management team for assistance with care management and care coordination.   Follow Up Plan: A HIPAA compliant phone message was left for the patient providing contact information and requesting a return call.  The care management team will reach out to the patient again over the next 7 days.  If patient returns call to provider office, please advise to call [place] * at [phone]*  Noreene Larsson, [place], [place] Management  [place], [place] [address] Direct Dial: [phone] [email or web address]: [email or web address]

## 2021-11-28 DIAGNOSIS — G3183 Dementia with Lewy bodies: Secondary | ICD-10-CM | POA: Diagnosis not present

## 2021-11-28 DIAGNOSIS — I482 Chronic atrial fibrillation, unspecified: Secondary | ICD-10-CM | POA: Diagnosis not present

## 2021-11-28 DIAGNOSIS — Z79899 Other long term (current) drug therapy: Secondary | ICD-10-CM | POA: Diagnosis not present

## 2021-11-28 DIAGNOSIS — I10 Essential (primary) hypertension: Secondary | ICD-10-CM | POA: Diagnosis not present

## 2021-12-14 ENCOUNTER — Encounter: Payer: Self-pay | Admitting: Family Medicine

## 2021-12-14 ENCOUNTER — Ambulatory Visit (INDEPENDENT_AMBULATORY_CARE_PROVIDER_SITE_OTHER): Payer: Medicare Other | Admitting: Family Medicine

## 2021-12-14 VITALS — BP 139/70 | HR 54 | Temp 98.7°F | Ht 61.0 in | Wt 123.4 lb

## 2021-12-14 DIAGNOSIS — R399 Unspecified symptoms and signs involving the genitourinary system: Secondary | ICD-10-CM | POA: Diagnosis not present

## 2021-12-14 DIAGNOSIS — I10 Essential (primary) hypertension: Secondary | ICD-10-CM

## 2021-12-14 DIAGNOSIS — R1314 Dysphagia, pharyngoesophageal phase: Secondary | ICD-10-CM | POA: Diagnosis not present

## 2021-12-14 DIAGNOSIS — R443 Hallucinations, unspecified: Secondary | ICD-10-CM | POA: Diagnosis not present

## 2021-12-14 LAB — URINALYSIS, COMPLETE
Bilirubin, UA: NEGATIVE
Glucose, UA: NEGATIVE
Ketones, UA: NEGATIVE
Leukocytes,UA: NEGATIVE
Nitrite, UA: NEGATIVE
Protein,UA: NEGATIVE
RBC, UA: NEGATIVE
Specific Gravity, UA: 1.02 (ref 1.005–1.030)
Urobilinogen, Ur: 1 mg/dL (ref 0.2–1.0)
pH, UA: 6 (ref 5.0–7.5)

## 2021-12-14 LAB — MICROSCOPIC EXAMINATION
RBC, Urine: NONE SEEN /hpf (ref 0–2)
Renal Epithel, UA: NONE SEEN /hpf

## 2021-12-14 NOTE — Progress Notes (Signed)
Established Patient Office Visit  Subjective   Patient ID: Crystal Browning, female    DOB: January 28, 1934  Age: 88 y.o. MRN: [id number]  Chief Complaint  Patient presents with   Dysphagia    HPI Crystal Browning is here today with her duaghter who helped provide the history due to her dementia. Crystal Browning reports that she has difficulty swallowing when she eats certain foods. 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 This is not a new problem for her. Her daughter helps to cut up her food. She does not have difficulty swallowing when her food is cut up. She does not have vomiting. She has not lost weight. Denies abdominal pain.   She has had dark urine for a few days with urgency and frequency. She denies dysuria, fever, chills, nausea, vomiting, abdominal pain, flank pain, or dysuria. There is a history of UTIs.   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   Radiation    S/P radiation thrapy -06/2005   TIA (transient ischemic attack)    History of TIA's with no residual effect      ROS As per HPI.    Objective:     BP 139/70 Comment: at home reading  per pt  Pulse (!) 54   Temp 98.7 F (37.1 C) (Temporal)   Ht '5\' 1"'$  (1.549 m)   Wt 123 lb 6 oz (56 kg)   SpO2 97%   BMI 23.31 kg/m  BP Readings from Last 3 Encounters:  12/14/21 139/70  09/28/21 (!) 136/59  09/22/21 (!) 174/79      Physical Exam Vitals and nursing note reviewed.  Constitutional:      General: She is not in acute distress.    Appearance: Normal appearance. She is normal weight. She is not ill-appearing, toxic-appearing or diaphoretic.  HENT:     Nose: Nose normal.     Mouth/Throat:     Mouth: Mucous membranes are moist.     Pharynx: Oropharynx is clear.  Cardiovascular:     Rate and Rhythm: Normal rate and regular rhythm.     Heart sounds: Normal heart sounds. No murmur heard. Pulmonary:     Effort: Pulmonary effort is normal. No respiratory distress.     Breath sounds: Normal breath sounds. No wheezing, rhonchi or rales.  Abdominal:     General: Bowel sounds are normal. There is no distension.     Palpations: Abdomen is soft.     Tenderness: There is no abdominal tenderness. There is no right CVA tenderness, left CVA tenderness, guarding or rebound.  Musculoskeletal:  Cervical back: No rigidity or tenderness.     Right lower leg: No edema.     Left lower leg: No edema.  Skin:    General: Skin is warm and dry.  Neurological:     Mental Status: She is alert and oriented to person, place, and time. Mental status is at baseline.     Motor: No weakness.  Psychiatric:        Mood and Affect: Mood normal.        Behavior: Behavior normal.   Urine dipstick shows negative for all components.  Micro exam: 0-5 WBC's per HPF and few+ bacteria.   No results found for any visits on 12/14/21.    The ASCVD Risk score (Arnett DK, et al., 2019) failed to calculate for the following reasons:   The 2019 ASCVD risk score is only valid for ages 24 to 53    Assessment & Plan:   Crystal Browning was seen today for dysphagia.  Diagnoses and all orders for this  visit:  UTI symptoms UA negative. Culture pending. Discussed hydration.  -     Urinalysis, Complete -     Urine Culture  Essential hypertension Elevated today in office but well controlled at home. Asymptomatic.   Pharyngoesophageal dysphagia Chronic. Reports stable.   Return if symptoms worsen or fail to improve.   The patient indicates understanding of these issues and agrees with the plan.   Gwenlyn Perking, FNP

## 2021-12-16 LAB — URINE CULTURE

## 2021-12-26 NOTE — Chronic Care Management (AMB) (Signed)
  Care Management   Outreach Note  12/26/2021 Name: Crystal Browning MRN: [id number] DOB: [birth year]  Referred by: [name], MD Reason for referral : Care Coordination (Outreach to schedule initial RNCM from HR List )   A second unsuccessful telephone outreach was attempted today. The patient was referred to the case management team for assistance with care management and care coordination.   Follow Up Plan:  A HIPAA compliant phone message was left for the patient providing contact information and requesting a return call.  The care management team will reach out to the patient again over the next 7 days.  If patient returns call to provider office, please advise to call [place] * at [phone]*  Noreene Larsson, [place], [place] Management  [place], [place] [address] Direct Dial: [phone] [email or web address]: [email or web address]

## 2022-01-08 ENCOUNTER — Ambulatory Visit: Payer: Medicare Other | Admitting: Internal Medicine

## 2022-01-19 ENCOUNTER — Ambulatory Visit: Payer: Medicare Other

## 2022-01-20 ENCOUNTER — Other Ambulatory Visit: Payer: Self-pay | Admitting: Family Medicine

## 2022-01-20 DIAGNOSIS — R441 Visual hallucinations: Secondary | ICD-10-CM

## 2022-01-22 ENCOUNTER — Encounter: Payer: Self-pay | Admitting: Family Medicine

## 2022-01-22 NOTE — Telephone Encounter (Signed)
Letter sent.

## 2022-01-22 NOTE — Telephone Encounter (Signed)
Stacks. NTBS 30 days given 10/21/21

## 2022-02-20 ENCOUNTER — Encounter: Payer: Self-pay | Admitting: Family Medicine

## 2022-02-20 ENCOUNTER — Ambulatory Visit (INDEPENDENT_AMBULATORY_CARE_PROVIDER_SITE_OTHER): Payer: Medicare Other | Admitting: Family Medicine

## 2022-02-20 VITALS — BP 214/81 | HR 62 | Temp 97.5°F | Ht 61.0 in | Wt 127.6 lb

## 2022-02-20 DIAGNOSIS — N390 Urinary tract infection, site not specified: Secondary | ICD-10-CM

## 2022-02-20 DIAGNOSIS — R441 Visual hallucinations: Secondary | ICD-10-CM | POA: Diagnosis not present

## 2022-02-20 DIAGNOSIS — Z7901 Long term (current) use of anticoagulants: Secondary | ICD-10-CM | POA: Diagnosis not present

## 2022-02-20 DIAGNOSIS — I10 Essential (primary) hypertension: Secondary | ICD-10-CM

## 2022-02-20 DIAGNOSIS — N898 Other specified noninflammatory disorders of vagina: Secondary | ICD-10-CM | POA: Diagnosis not present

## 2022-02-20 DIAGNOSIS — I4811 Longstanding persistent atrial fibrillation: Secondary | ICD-10-CM

## 2022-02-20 DIAGNOSIS — E039 Hypothyroidism, unspecified: Secondary | ICD-10-CM | POA: Diagnosis not present

## 2022-02-20 DIAGNOSIS — R6889 Other general symptoms and signs: Secondary | ICD-10-CM | POA: Diagnosis not present

## 2022-02-20 LAB — URINALYSIS, COMPLETE
Bilirubin, UA: NEGATIVE
Glucose, UA: NEGATIVE
Ketones, UA: NEGATIVE
Leukocytes,UA: NEGATIVE
Nitrite, UA: NEGATIVE
Protein,UA: NEGATIVE
Specific Gravity, UA: <1.005 — ABNORMAL LOW (ref 1.005–1.030)
Urobilinogen, Ur: 0.2 mg/dL (ref 0.2–1.0)
pH, UA: 6 (ref 5.0–7.5)

## 2022-02-20 LAB — MICROSCOPIC EXAMINATION
Renal Epithel, UA: NONE SEEN /hpf
WBC, UA: NONE SEEN /hpf (ref 0–5)

## 2022-02-20 MED ORDER — QUETIAPINE FUMARATE 50 MG PO TABS: 50.0000 mg | ORAL_TABLET | Freq: Every day | ORAL | 1 refills | Status: DC

## 2022-02-20 MED ORDER — NIFEDIPINE ER OSMOTIC RELEASE 30 MG PO TB24: 30.0000 mg | ORAL_TABLET | Freq: Every day | ORAL | 1 refills | Status: DC

## 2022-02-20 MED ORDER — ESTRADIOL 0.1 MG/GM VA CREA: 1.0000 | TOPICAL_CREAM | Freq: Every day | VAGINAL | 12 refills | Status: AC

## 2022-02-20 NOTE — Progress Notes (Unsigned)
Subjective:  Patient ID: Crystal Browning, female    DOB: [birth year]  Age: 88 y.o. MRN: [id number]  CC: Medical Management of Chronic Issues   HPI Crystal Browning presents for [employer] well. Hallucinations gone since adding seroquel.      02/20/2022    3:27 PM 09/28/2021    3:54 PM 09/28/2021    3:46 PM  Depression screen PHQ 2/9  Decreased Interest 0 2 0  Down, Depressed, Hopeless 0 2 0  PHQ - 2 Score 0 4 0  Altered sleeping  2   Tired, decreased energy  2   Change in appetite  2   Feeling bad or failure about yourself   3   Trouble concentrating  2   Moving slowly or fidgety/restless  2   Suicidal thoughts  0   PHQ-9 Score  17   Difficult doing work/chores  Somewhat difficult     History Crystal Browning has a past medical history of Arthritis, Atrial fibrillation ([place]) (10/30/2016), Bilateral lower extremity edema (10/30/2016), Cancer ([place]) (03/28/2011), CAP (community acquired pneumonia) (10/04/2019), Dementia ([place]), Depression, Diverticulosis (02/2005), Esophageal stricture (03/03/2018), Generalized abdominal pain (07/17/2015), History of chemotherapy, History of external beam radiation therapy (08/20/2016), Hypertension, Hypothyroid, Osteoporosis, 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   She has a past surgical history that includes Direct laryngoscopy (03/27/2005); Tubal ligation; Total abdominal hysterectomy w/ bilateral salpingoophorectomy (1986); Cataract extraction, bilateral; and Cardioversion (N/A, 11/23/2016).   Her family history includes Alcohol abuse in her brother; COPD in her brother; Cancer (age of onset: 49) in her father; Heart disease (age of onset: 105) in her mother; Stroke in her mother.She reports that she has never smoked. She has never used smokeless tobacco. She reports that she does not drink alcohol and does not use drugs.    ROS Review of Systems  Objective:  BP (!) 214/81   Pulse 62    Temp (!) 97.5 F (36.4 C)   Ht '5\' 1"'  (1.549 m)   Wt 127 lb 9.6 oz (57.9 kg)   SpO2 97%   BMI 24.11 kg/m   BP Readings from Last 3 Encounters:  02/20/22 (!) 214/81  12/14/21 139/70  09/28/21 (!) 136/59    Wt Readings from Last 3 Encounters:  02/20/22 127 lb 9.6 oz (57.9 kg)  12/14/21 123 lb 6 oz (56 kg)  09/28/21 121 lb 12.8 oz (55.2 kg)     Physical Exam    Assessment & Plan:   Nicoya was seen today for medical management of chronic issues.  Diagnoses and all orders for this visit:  Chronic UTI -     Urinalysis, Complete  Longstanding persistent atrial fibrillation (HCC)  Itching in the vaginal area  Long term (current) use of anticoagulants  Hypothyroidism, unspecified type -     TSH + free T4 -     CBC with Differential/Platelet -     CMP14+EGFR -     Vitamin B12  Essential hypertension -     CBC with Differential/Platelet -     CMP14+EGFR -     Vitamin B12  Other orders -     estradiol (ESTRACE VAGINAL) 0.1 MG/GM vaginal cream; Place 1 Applicatorful vaginally at bedtime.       I am having Shandria B. Zeman start on estradiol. I am also having her maintain her acetaminophen, famotidine, apixaban, escitalopram, levothyroxine, metoprolol succinate, solifenacin, OVER THE COUNTER MEDICATION, cyanocobalamin, QUEtiapine, and donepezil.  Allergies as  of 02/20/2022       Reactions   Amlodipine Besylate Hives   Hives and confusion.   Tuberculin Tests Swelling   Claritin [loratadine] Rash   Pt's daughter states she is allergic to claritin   Paroxetine Hcl Rash   Vit D-vit E-safflower Oil         Medication List        Accurate as of February 20, 2022  3:58 PM. If you have any questions, ask your nurse or doctor.          acetaminophen 325 MG tablet Commonly known as: TYLENOL Take 2 tablets (650 mg total) by mouth every 6 (six) hours as needed for mild pain, fever or headache.   apixaban 5 MG Tabs tablet Commonly known as: Eliquis Take 1 tablet  (5 mg total) by mouth 2 (two) times daily.   cyanocobalamin 1000 MCG tablet Commonly known as: VITAMIN B12 Take 1,000 mcg by mouth daily.   donepezil 10 MG tablet Commonly known as: ARICEPT Take 10 mg by mouth daily.   escitalopram 10 MG tablet Commonly known as: LEXAPRO Take 1 tablet (10 mg total) by mouth daily.   estradiol 0.1 MG/GM vaginal cream Commonly known as: ESTRACE VAGINAL Place 1 Applicatorful vaginally at bedtime. Started by: [name], MD   famotidine 20 MG tablet Commonly known as: PEPCID Take 1 tablet (20 mg total) by mouth every 12 (twelve) hours. What changed: when to take this   levothyroxine 50 MCG tablet Commonly known as: SYNTHROID Take 1 tablet (50 mcg total) by mouth daily.   metoprolol succinate 100 MG 24 hr tablet Commonly known as: TOPROL-XL TAKE 1 TABLET DAILY WITH OR IMMEDIATELY FOLLOWING A MEAL.   OVER THE COUNTER MEDICATION Vitamin b 12   QUEtiapine 50 MG tablet Commonly known as: SEROQUEL Take 1 tablet (50 mg total) by mouth at bedtime. Needs to be seen for any further refills   solifenacin 5 MG tablet Commonly known as: VESICARE Take 1 tablet (5 mg total) by mouth daily.         Follow-up: No follow-ups on file.  [name], M.D.

## 2022-02-21 ENCOUNTER — Encounter: Payer: Self-pay | Admitting: Family Medicine

## 2022-02-21 LAB — CMP14+EGFR
ALT: 11 IU/L (ref 0–32)
AST: 26 IU/L (ref 0–40)
Albumin/Globulin Ratio: 1.6 (ref 1.2–2.2)
Albumin: 4.2 g/dL (ref 3.7–4.7)
Alkaline Phosphatase: 87 IU/L (ref 44–121)
BUN/Creatinine Ratio: 11 — ABNORMAL LOW (ref 12–28)
BUN: 10 mg/dL (ref 8–27)
Bilirubin Total: 0.6 mg/dL (ref 0.0–1.2)
CO2: 23 mmol/L (ref 20–29)
Calcium: 9.2 mg/dL (ref 8.7–10.3)
Chloride: 104 mmol/L (ref 96–106)
Creatinine, Ser: 0.89 mg/dL (ref 0.57–1.00)
Globulin, Total: 2.7 g/dL (ref 1.5–4.5)
Glucose: 89 mg/dL (ref 70–99)
Potassium: 4.2 mmol/L (ref 3.5–5.2)
Sodium: 141 mmol/L (ref 134–144)
Total Protein: 6.9 g/dL (ref 6.0–8.5)
eGFR: 63 mL/min/{1.73_m2} (ref >59)

## 2022-02-21 LAB — CBC WITH DIFFERENTIAL/PLATELET
Basophils Absolute: 0 10*3/uL (ref 0.0–0.2)
Basos: 0 %
EOS (ABSOLUTE): 0.2 10*3/uL (ref 0.0–0.4)
Eos: 3 %
Hematocrit: 37.5 % (ref 34.0–46.6)
Hemoglobin: 12.4 g/dL (ref 11.1–15.9)
Immature Grans (Abs): 0 10*3/uL (ref 0.0–0.1)
Immature Granulocytes: 0 %
Lymphocytes Absolute: 1.5 10*3/uL (ref 0.7–3.1)
Lymphs: 28 %
MCH: 32 pg (ref 26.6–33.0)
MCHC: 33.1 g/dL (ref 31.5–35.7)
MCV: 97 fL (ref 79–97)
Monocytes Absolute: 0.5 10*3/uL (ref 0.1–0.9)
Monocytes: 10 %
Neutrophils Absolute: 3.1 10*3/uL (ref 1.4–7.0)
Neutrophils: 59 %
Platelets: 206 10*3/uL (ref 150–450)
RBC: 3.88 x10E6/uL (ref 3.77–5.28)
RDW: 13.5 % (ref 11.7–15.4)
WBC: 5.3 10*3/uL (ref 3.4–10.8)

## 2022-02-21 LAB — TSH+FREE T4
Free T4: 1.14 ng/dL (ref 0.82–1.77)
TSH: 3.04 u[IU]/mL (ref 0.450–4.500)

## 2022-02-21 LAB — VITAMIN B12: Vitamin B-12: 1305 pg/mL — ABNORMAL HIGH (ref 232–1245)

## 2022-02-21 NOTE — Progress Notes (Signed)
Hello Tacy,  Your lab result is normal and/or stable.Some minor variations that are not significant are commonly marked abnormal, but do not represent any medical problem for you.  Best regards, [name], M.D.

## 2022-03-01 ENCOUNTER — Telehealth: Payer: Self-pay | Admitting: Family Medicine

## 2022-03-01 NOTE — Telephone Encounter (Signed)
Daughter aware.

## 2022-03-01 NOTE — Telephone Encounter (Signed)
The nifedipine can cause harmless swelling. Do NOT stop the medication for this.

## 2022-03-08 ENCOUNTER — Encounter: Payer: Self-pay | Admitting: Family Medicine

## 2022-03-08 ENCOUNTER — Ambulatory Visit (INDEPENDENT_AMBULATORY_CARE_PROVIDER_SITE_OTHER): Payer: Medicare Other | Admitting: Family Medicine

## 2022-03-08 VITALS — BP 211/68 | HR 52 | Temp 97.6°F | Ht 61.0 in | Wt 129.0 lb

## 2022-03-08 DIAGNOSIS — I10 Essential (primary) hypertension: Secondary | ICD-10-CM | POA: Diagnosis not present

## 2022-03-08 MED ORDER — TRIAMTERENE-HCTZ 37.5-25 MG PO TABS: 1.0000 | ORAL_TABLET | Freq: Every day | ORAL | 3 refills | Status: DC

## 2022-03-08 NOTE — Progress Notes (Signed)
Subjective:  Patient ID: Crystal Browning, female    DOB: 1934-07-15  Age: 88 y.o. MRN: [id number]  CC: Follow-up and Hypertension   HPI Crystal Browning presents for  follow-up of hypertension. Patient has no history of headache chest pain or shortness of breath or recent cough. Patient also denies symptoms of TIA such as focal numbness or weakness.Swelled so bad with nifedipine that she called the ambulance. States home BP today was 139/81.   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   She has a past surgical history that includes Direct laryngoscopy (03/27/2005); Tubal ligation; Total abdominal hysterectomy w/ bilateral salpingoophorectomy (1986); Cataract extraction, bilateral; and Cardioversion (N/A, 11/23/2016).   Her family history includes Alcohol abuse in her brother; COPD in her brother; Cancer (age of onset: 49) in her father; Heart disease (age of onset: 20) in her mother; Stroke in her mother.She reports that she has never smoked. She has never used smokeless tobacco. She reports that she does not drink alcohol and does not use drugs.  Current Outpatient Medications on File Prior to Visit  Medication Sig Dispense Refill   acetaminophen (TYLENOL) 325 MG tablet Take 2 tablets (650 mg total) by mouth every 6 (six) hours as needed for mild pain, fever or headache. 12 tablet 0   apixaban (ELIQUIS) 5 MG TABS tablet Take 1 tablet (5 mg total) by mouth 2 (two) times daily.  180 tablet 3   donepezil (ARICEPT) 10 MG tablet Take 10 mg by mouth daily.     escitalopram (LEXAPRO) 10 MG tablet Take 1 tablet (10 mg total) by mouth daily. 90 tablet 3   estradiol (ESTRACE VAGINAL) 0.1 MG/GM vaginal cream Place 1 Applicatorful vaginally at bedtime. 42.5 g 12   famotidine (PEPCID) 20 MG tablet Take 1 tablet (20 mg total) by mouth every 12 (twelve) hours. (Patient taking differently: Take 20 mg by mouth daily.) 180 tablet 1   levothyroxine (SYNTHROID) 50 MCG tablet Take 1 tablet (50 mcg total) by mouth daily. 90 tablet 3   metoprolol succinate (TOPROL-XL) 100 MG 24 hr tablet TAKE 1 TABLET DAILY WITH OR IMMEDIATELY FOLLOWING A MEAL. 90 tablet 3   OVER THE COUNTER MEDICATION Vitamin b 12     QUEtiapine (SEROQUEL) 50 MG tablet Take 1 tablet (50 mg total) by mouth at bedtime. 90 tablet 1   solifenacin (VESICARE) 5 MG tablet Take 1 tablet (5 mg total) by mouth daily. 90 tablet 3   vitamin B-12 (CYANOCOBALAMIN) 1000 MCG tablet Take 1,000 mcg by mouth daily.     NIFEdipine (PROCARDIA-XL/NIFEDICAL-XL) 30 MG 24 hr tablet Take 1 tablet (30 mg total) by mouth daily. (Patient not taking: Reported on 03/08/2022) 30 tablet 1   No current facility-administered medications on file prior to visit.    ROS Review of Systems  Constitutional: Negative.   HENT: Negative.    Eyes:  Negative for visual disturbance.  Respiratory:  Negative for shortness of breath.   Cardiovascular:  Negative for chest pain.  Gastrointestinal:  Negative for abdominal pain.  Musculoskeletal:  Negative for arthralgias.    Objective:  BP (!) 211/68   Pulse (!) 52  Temp 97.6 F (36.4 C)   Ht '5\' 1"'$  (1.549 m)   Wt 129 lb (58.5 kg)   SpO2 97%   BMI 24.37 kg/m   BP Readings from Last 3 Encounters:  03/08/22 (!) 211/68  02/20/22 (!) 214/81  12/14/21 139/70    Wt Readings from Last 3 Encounters:  03/08/22 129 lb (58.5 kg)  02/20/22 127 lb 9.6 oz (57.9 kg)  12/14/21 123 lb 6 oz (56 kg)     Physical  Exam Constitutional:      General: She is not in acute distress.    Appearance: She is well-developed.  Cardiovascular:     Rate and Rhythm: Normal rate and regular rhythm.  Pulmonary:     Breath sounds: Normal breath sounds.  Musculoskeletal:        General: Normal range of motion.  Skin:    General: Skin is warm and dry.  Neurological:     Mental Status: She is alert and oriented to person, place, and time.       Assessment & Plan:   Crystal Browning was seen today for follow-up and hypertension.  Diagnoses and all orders for this visit:  Essential hypertension  Other orders -     triamterene-hydrochlorothiazide (MAXZIDE-25) 37.5-25 MG tablet; Take 1 tablet by mouth daily. For blood pressure and fluid   Allergies as of 03/08/2022       Reactions   Amlodipine Besylate Hives   Hives and confusion.   Tuberculin Tests Swelling   Claritin [loratadine] Rash   Pt's daughter states she is allergic to claritin   Paroxetine Hcl Rash   Vit D-vit E-safflower Oil         Medication List        Accurate as of March 08, 2022 11:59 PM. If you have any questions, ask your nurse or doctor.          acetaminophen 325 MG tablet Commonly known as: TYLENOL Take 2 tablets (650 mg total) by mouth every 6 (six) hours as needed for mild pain, fever or headache.   apixaban 5 MG Tabs tablet Commonly known as: Eliquis Take 1 tablet (5 mg total) by mouth 2 (two) times daily.   cyanocobalamin 1000 MCG tablet Commonly known as: VITAMIN B12 Take 1,000 mcg by mouth daily.   donepezil 10 MG tablet Commonly known as: ARICEPT Take 10 mg by mouth daily.   escitalopram 10 MG tablet Commonly known as: LEXAPRO Take 1 tablet (10 mg total) by mouth daily.   estradiol 0.1 MG/GM vaginal cream Commonly known as: ESTRACE VAGINAL Place 1 Applicatorful vaginally at bedtime.   famotidine 20 MG tablet Commonly known as: PEPCID Take 1 tablet (20 mg total) by mouth every 12 (twelve) hours. What  changed: when to take this   levothyroxine 50 MCG tablet Commonly known as: SYNTHROID Take 1 tablet (50 mcg total) by mouth daily.   metoprolol succinate 100 MG 24 hr tablet Commonly known as: TOPROL-XL TAKE 1 TABLET DAILY WITH OR IMMEDIATELY FOLLOWING A MEAL.   NIFEdipine 30 MG 24 hr tablet Commonly known as: PROCARDIA-XL/NIFEDICAL-XL Take 1 tablet (30 mg total) by mouth daily.   OVER THE COUNTER MEDICATION Vitamin b 12   QUEtiapine 50 MG tablet Commonly known as: SEROQUEL Take 1 tablet (50 mg total) by mouth at bedtime.   solifenacin 5 MG tablet Commonly known as: VESICARE Take 1 tablet (5 mg total) by mouth daily.   triamterene-hydrochlorothiazide 37.5-25 MG tablet Commonly known as: MAXZIDE-25 Take 1 tablet by  mouth daily. For blood pressure and fluid Started by: [name], MD        Meds ordered this encounter  Medications   triamterene-hydrochlorothiazide (MAXZIDE-25) 37.5-25 MG tablet    Sig: Take 1 tablet by mouth daily. For blood pressure and fluid    Dispense:  90 tablet    Refill:  3        Follow-up: Return in about 3 months (around 06/08/2022).  [name], M.D.

## 2022-03-12 ENCOUNTER — Encounter: Payer: Self-pay | Admitting: Family Medicine

## 2022-03-16 ENCOUNTER — Other Ambulatory Visit: Payer: Self-pay | Admitting: Family Medicine

## 2022-03-16 DIAGNOSIS — I10 Essential (primary) hypertension: Secondary | ICD-10-CM

## 2022-03-27 ENCOUNTER — Telehealth: Payer: Self-pay

## 2022-03-27 NOTE — Telephone Encounter (Signed)
Patient fell last night and hit her head and arm. She has come light headedness and per daughter denies n/v and confusion. Daughter states that the patient's arm is still bleed some - patient is on a blood thinner.  Advised daughter that patient needs to be evaluated at the ED as we do not have any openings until tomorrow and patient needs care today. Explained to daughter that the arm needs to be looked at and treated to help stop bleeding and that the head injury may require imaging that the ED can order and have done today. Daughter expressed understanding and will call back if needed for further questions or concerns.

## 2022-04-08 ENCOUNTER — Other Ambulatory Visit: Payer: Self-pay | Admitting: Family Medicine

## 2022-04-08 DIAGNOSIS — I10 Essential (primary) hypertension: Secondary | ICD-10-CM

## 2022-05-07 ENCOUNTER — Telehealth: Payer: Self-pay | Admitting: Family Medicine

## 2022-05-07 NOTE — Telephone Encounter (Signed)
Left message for patient to call back and schedule Medicare Annual Wellness Visit (AWV) to be completed by video or phone.   Last AWV: 01/18/2021  Please schedule at anytime with [facility] Nurse Health Advisor     45 minute appointment  Any questions, please contact me at [phone]      

## 2022-05-22 DIAGNOSIS — G3183 Dementia with Lewy bodies: Secondary | ICD-10-CM | POA: Diagnosis not present

## 2022-05-22 DIAGNOSIS — I10 Essential (primary) hypertension: Secondary | ICD-10-CM | POA: Diagnosis not present

## 2022-05-22 DIAGNOSIS — Z79899 Other long term (current) drug therapy: Secondary | ICD-10-CM | POA: Diagnosis not present

## 2022-05-22 DIAGNOSIS — I482 Chronic atrial fibrillation, unspecified: Secondary | ICD-10-CM | POA: Diagnosis not present

## 2022-05-31 ENCOUNTER — Encounter: Payer: Self-pay | Admitting: Internal Medicine

## 2022-05-31 ENCOUNTER — Ambulatory Visit: Payer: Medicare Other | Attending: Internal Medicine | Admitting: Internal Medicine

## 2022-05-31 VITALS — BP 196/83 | HR 60 | Resp 97 | Ht 60.0 in | Wt 130.8 lb

## 2022-05-31 DIAGNOSIS — I10 Essential (primary) hypertension: Secondary | ICD-10-CM | POA: Diagnosis not present

## 2022-05-31 DIAGNOSIS — Z7901 Long term (current) use of anticoagulants: Secondary | ICD-10-CM | POA: Insufficient documentation

## 2022-05-31 DIAGNOSIS — I4821 Permanent atrial fibrillation: Secondary | ICD-10-CM | POA: Diagnosis not present

## 2022-05-31 MED ORDER — APIXABAN 2.5 MG PO TABS: 2.5000 mg | ORAL_TABLET | Freq: Two times a day (BID) | ORAL | 3 refills | Status: DC

## 2022-05-31 NOTE — Progress Notes (Signed)
OFFICE CONSULT NOTE  Chief Complaint:  No complaints  Primary Care Physician: [name], MD  HPI:  KHYLAH KENDRA is a 88 y.o. female who is being seen today for the evaluation of atrial fibrillation at the request of [name], MD. Ailene was recently seen for symptoms of palpitations and shortness of breath which was mostly noted by her daughter. She said that they routinely go shopping at [facility] and that while walking through the building she became more short of breath easier. It does not seem that Mrs. Fulcher typically complaints about any worsening shortness of breath or symptoms but it was clear to her daughter that she was struggling somewhat to get around. She noted that her pulse was irregular and when she was seen in her primary care doctor's office she was found to be in atrial fibrillation. There is no history of A. fib in the family or personal history although she did have possibly a mild MI after the death of her husband, which sounds like it could've been a stress-induced cardiomyopathic event. She was a ready on metoprolol succinate 50 mg daily and that was increased up to 100 mg daily for rate control and started on Xarelto. Unfortunate she was given the Xarelto DBT dose pack which is 50 mg twice a day and then switching to 20 mg daily. The appropriate dose for her would've been 20 mg daily from the beginning. So far she has not had any bleeding problems. She is also on low-dose aspirin. There is apparently history of diverticulum.  12/14/2016  Mrs. Mow returns today for follow-up of her cardioversion. This is performed by myself on 11/23/2016. She was cardioverted once at 150 J successfully to sinus rhythm. Per her daughter's report she had a partial choking event which was related to x-ray therapy in the past 2 days after her cardioversion. EMS was called and she was noted to be in sinus rhythm at the time. She says she's felt very good since then. She continues to feel  well today. Unfortunately, her EKG today shows she is back in atrial fibrillation although rate controlled in the 70s. Based on this finding I think that she is actually somewhat asymptomatic with the A. fib and therefore I would not recommend pursuing a rhythm control strategy in her. We also discussed anticoagulation. Her daughter's concerned about possibly higher bleeding risk on Xarelto and is interested in switching her to Eliquis.  06/14/2017  Mrs. Chew returns today for follow-up.  She is done well without any recurrent A. fib.  She occasionally gets some dizziness but I do not feel that this is the A. fib.  EKG shows sinus rhythm today.  She denies any bleeding problems on Eliquis.  Recently labs were done in August by her primary care provider which showed normal renal function and stable hemoglobin and hematocrit.  Blood pressure is at goal today.  06/24/2018  Mrs. Mogensen is seen today in follow-up.  She seems to be back in A. fib today.  Apparently recently she was seen in her PCPs office and was in sinus rhythm.  This suggests her A. fib is paroxysmal.  She is tolerating Eliquis without any bleeding problems.  Her blood pressure is noted to be high today at 180/84 however recheck came down to 314 systolic.  Her PCP has noted labile blood pressures in the past and apparently she was placed on some additional blood pressure medicine which "bottomed her out" according to her daughter.  Labs from June 11, 2018 showed total cholesterol 166, HDL 41, LDL 97 and triglycerides 141.  07/01/2019  Mrs. Star returns today in follow-up.  She is accompanied by her daughter.  Blood pressure is again elevated in the office today however at home she is certain it runs around 465 systolic.  She denies any chest pain or worsening shortness of breath.  She is back in A. fib today with controlled ventricular response at 74, but is asymptomatic with this.  She is anticoagulated on Eliquis without bleeding  issues.  06/20/2020  Mrs. Rana is seen today in follow-up. Her daughter is with her again today. Her PCP apparently started her back on some low-dose amlodipine. Her daughter was concerned because she had side effects with this including hives and has had recent issues with memory that seem to be worse. There is a suggestion of labile hypertension and at times her blood pressure was low at home. Today the blood pressure is high in the office at 174/68. She is in A. fib today but rate controlled. No difficulties with anticoagulation has been reported. She denies any falls. She had labs in September showing total cholesterol 166, HDL 54, LDL 89 and triglycerides 131.  05/31/2022  Mrs. Huskins is seen today in follow-up with her daughter.  She continues to do well.  Her home blood pressure readings generally run in the 681 systolic range.  Her office readings however are usually quite elevated.  Her PCP has gotten readings in the 200s.  Her systolic blood pressure today was 196.  The home cuff appears to be calibrated and has been used by other family members which seem to correlate with office readings.  I think she has some significant whitecoat hypertension.  She has not had any recent falls.  She is in A-fib which is persistent.  She has been on lower dose Eliquis however her weight is remained below 130 pounds which is less than 60 kg.  Based on this and her age over 65, she will qualify for Eliquis 2.5 mg twice daily.  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   Radiation    S/P radiation thrapy -06/2005   TIA (transient ischemic attack)    History of TIA's with no residual effect    Past Surgical History:  Procedure Laterality Date   CARDIOVERSION N/A 11/23/2016   Procedure: CARDIOVERSION;  Surgeon: Pixie Casino, MD;  Location: [facility];  Service: Cardiovascular;  Laterality: N/A;   CATARACT EXTRACTION, BILATERAL     DIRECT LARYNGOSCOPY  03/27/2005   S/P direct laryngoscopy, esophagoscopy and biopsy with Dr. Constance Holster  TOTAL ABDOMINAL HYSTERECTOMY W/ BILATERAL SALPINGOOPHORECTOMY  1986   TUBAL LIGATION      FAMHx:  Family History  Problem Relation Age of Onset   Stroke Mother    Heart disease Mother 56   Cancer Father 22       Prostate   Alcohol abuse Brother    COPD Brother     SOCHx:   reports that she has never smoked. She has never used smokeless tobacco. She reports that she does not drink alcohol and does not use drugs.  ALLERGIES:  Allergies  Allergen Reactions   Amlodipine Besylate Hives    Hives and confusion.   Tuberculin Tests Swelling   Nifedipine Swelling   Claritin [Loratadine] Rash    Pt's daughter states she is allergic to claritin   Paroxetine Hcl Rash   Vit D-Vit E-Safflower Oil     ROS: Pertinent items noted in HPI and remainder of comprehensive ROS otherwise negative.  HOME MEDS: Current Outpatient Medications on File Prior to Visit  Medication Sig Dispense Refill   acetaminophen (TYLENOL) 325 MG tablet Take 2 tablets (650 mg total) by mouth every 6 (six) hours as needed for mild pain, fever or headache. 12 tablet 0    apixaban (ELIQUIS) 5 MG TABS tablet Take 1 tablet (5 mg total) by mouth 2 (two) times daily. 180 tablet 3   donepezil (ARICEPT) 10 MG tablet Take 10 mg by mouth daily.     escitalopram (LEXAPRO) 10 MG tablet Take 1 tablet (10 mg total) by mouth daily. 90 tablet 3   estradiol (ESTRACE VAGINAL) 0.1 MG/GM vaginal cream Place 1 Applicatorful vaginally at bedtime. 42.5 g 12   famotidine (PEPCID) 20 MG tablet Take 1 tablet (20 mg total) by mouth every 12 (twelve) hours. (Patient taking differently: Take 20 mg by mouth daily.) 180 tablet 1   levothyroxine (SYNTHROID) 50 MCG tablet Take 1 tablet (50 mcg total) by mouth daily. 90 tablet 3   metoprolol succinate (TOPROL-XL) 100 MG 24 hr tablet TAKE 1 TABLET DAILY WITH OR IMMEDIATELY FOLLOWING A MEAL. 90 tablet 3   OVER THE COUNTER MEDICATION Vitamin b 12     QUEtiapine (SEROQUEL) 50 MG tablet Take 1 tablet (50 mg total) by mouth at bedtime. 90 tablet 1   solifenacin (VESICARE) 5 MG tablet Take 1 tablet (5 mg total) by mouth daily. 90 tablet 3   triamterene-hydrochlorothiazide (MAXZIDE-25) 37.5-25 MG tablet Take 1 tablet by mouth daily. For blood pressure and fluid 90 tablet 3   vitamin B-12 (CYANOCOBALAMIN) 1000 MCG tablet Take 1,000 mcg by mouth daily.     No current facility-administered medications on file prior to visit.    LABS/IMAGING: No results found for this or any previous visit (from the past 48 hour(s)). No results found.  WEIGHTS: Wt Readings from Last 3 Encounters:  05/31/22 130 lb 12.8 oz (59.3 kg)  03/08/22 129 lb (58.5 kg)  02/20/22 127 lb 9.6 oz (57.9 kg)    VITALS: BP (!) 196/83 (BP Location: Left Arm, Patient Position: Sitting)   Pulse 60   Resp (!) 97   Ht 5' (1.524 m)   Wt 130 lb 12.8 oz (59.3 kg)   BMI 25.55 kg/m   EXAM: General appearance: alert and no distress Neck: no carotid bruit and no JVD Lungs: clear to auscultation bilaterally Heart: regular rate and rhythm, S1, S2 normal, no murmur, click, rub or  gallop Abdomen: soft, non-tender; bowel sounds normal; no masses,  no organomegaly Extremities: extremities normal,  atraumatic, no cyanosis or edema and varicose veins noted Pulses: 2+ and symmetric Skin: Skin color, texture, turgor normal. No rashes or lesions Neurologic: Grossly normal Psych: Pleasant  EKG: A-fib, bifascicular block at 63-personally reviewed  ASSESSMENT: Persistent atrial fibrillation-successful DCCV, however there was recurrence CHADSVASC score of 4-on Eliquis Hypertension Venous insufficiency  PLAN: 1.   Mrs. Baltazar has done fairly well.  No significant recurrent falls.  Because of her age and weight less than 60 kg she qualifies for 2.5 mg twice daily Eliquis dose which is associated with lower bleeding risk with similar efficacy.  I will make that change today.  Office blood pressures are high which is likely whitecoat hypertension.  Her home blood pressure readings are much lower.  Overall she is getting around pretty well.  Follow-up annually or sooner as necessary.  Pixie Casino, MD, [facility], [place] Director of the Advanced Lipid Disorders &  Cardiovascular [facility] Attending Cardiologist  Direct Dial: [phone]  Fax: [phone]  Website:  [email or web address] 05/31/2022, 2:02 PM

## 2022-05-31 NOTE — Patient Instructions (Signed)
Medication Instructions:  Decrease Eliquis 2.5 mg twice a day Continue all other medications *If you need a refill on your cardiac medications before your next appointment, please call your pharmacy*   Lab Work: None ordered   Testing/Procedures: None ordered   Follow-Up: At [facility], you and your health needs are our priority.  As part of our continuing mission to provide you with exceptional heart care, we have created designated Provider Care Teams.  These Care Teams include your primary Cardiologist (physician) and Advanced Practice Providers (APPs -  Physician Assistants and Nurse Practitioners) who all work together to provide you with the care you need, when you need it.  We recommend signing up for the patient portal called "MyChart".  Sign up information is provided on this After Visit Summary.  MyChart is used to connect with patients for Virtual Visits (Telemedicine).  Patients are able to view lab/test results, encounter notes, upcoming appointments, etc.  Non-urgent messages can be sent to your provider as well.   To learn more about what you can do with MyChart, go to [email or web address].    Your next appointment:  1 Year    Call in July to schedule Nov appointment     The format for your next appointment: Office   Provider:  Dr.Hilty   Important Information About Sugar

## 2022-07-05 ENCOUNTER — Other Ambulatory Visit: Payer: Self-pay | Admitting: Family Medicine

## 2022-07-05 DIAGNOSIS — I10 Essential (primary) hypertension: Secondary | ICD-10-CM

## 2022-07-07 ENCOUNTER — Other Ambulatory Visit: Payer: Self-pay | Admitting: Family Medicine

## 2022-07-12 ENCOUNTER — Other Ambulatory Visit: Payer: Self-pay | Admitting: Family Medicine

## 2022-07-13 ENCOUNTER — Other Ambulatory Visit: Payer: Self-pay | Admitting: Family Medicine

## 2022-07-13 DIAGNOSIS — I10 Essential (primary) hypertension: Secondary | ICD-10-CM

## 2022-07-13 DIAGNOSIS — E039 Hypothyroidism, unspecified: Secondary | ICD-10-CM

## 2022-07-13 DIAGNOSIS — I4891 Unspecified atrial fibrillation: Secondary | ICD-10-CM

## 2022-07-17 ENCOUNTER — Other Ambulatory Visit: Payer: Self-pay | Admitting: *Deleted

## 2022-07-17 ENCOUNTER — Telehealth: Payer: Self-pay | Admitting: Family Medicine

## 2022-07-17 DIAGNOSIS — N3941 Urge incontinence: Secondary | ICD-10-CM

## 2022-07-17 MED ORDER — SOLIFENACIN SUCCINATE 5 MG PO TABS: 5.0000 mg | ORAL_TABLET | Freq: Every day | ORAL | 3 refills | Status: DC

## 2022-07-17 NOTE — Telephone Encounter (Signed)
REFILL SENT AS REQUESTED ?

## 2022-07-17 NOTE — Telephone Encounter (Signed)
Pts caregiver called requesting to speak with Dr Livia Snellen nurse about getting refill on her bladder medication. Says pt was supposed to be taking it everyday but they found out that patient was not taking it everyday. Says she had spoken with Dr Livia Snellen nurse about this and nurse told her to call back if she did need a refill. Caregiver says patient does because it helps her with her bladder.  Please advise and call patient or caregiver [name]).

## 2022-07-18 ENCOUNTER — Other Ambulatory Visit: Payer: Self-pay | Admitting: Family Medicine

## 2022-07-18 DIAGNOSIS — R441 Visual hallucinations: Secondary | ICD-10-CM

## 2022-08-05 ENCOUNTER — Other Ambulatory Visit: Payer: Self-pay | Admitting: Family Medicine

## 2022-08-05 DIAGNOSIS — F419 Anxiety disorder, unspecified: Secondary | ICD-10-CM

## 2022-08-05 DIAGNOSIS — I10 Essential (primary) hypertension: Secondary | ICD-10-CM

## 2022-08-05 DIAGNOSIS — I4891 Unspecified atrial fibrillation: Secondary | ICD-10-CM

## 2022-08-09 ENCOUNTER — Encounter: Payer: Self-pay | Admitting: Family Medicine

## 2022-08-09 ENCOUNTER — Ambulatory Visit (INDEPENDENT_AMBULATORY_CARE_PROVIDER_SITE_OTHER): Payer: Medicare Other | Admitting: Family Medicine

## 2022-08-09 VITALS — BP 239/86 | HR 60 | Temp 97.6°F | Ht 60.0 in | Wt 127.4 lb

## 2022-08-09 DIAGNOSIS — K5900 Constipation, unspecified: Secondary | ICD-10-CM | POA: Diagnosis not present

## 2022-08-09 DIAGNOSIS — K921 Melena: Secondary | ICD-10-CM

## 2022-08-09 DIAGNOSIS — I4811 Longstanding persistent atrial fibrillation: Secondary | ICD-10-CM | POA: Diagnosis not present

## 2022-08-09 DIAGNOSIS — I10 Essential (primary) hypertension: Secondary | ICD-10-CM

## 2022-08-09 DIAGNOSIS — K625 Hemorrhage of anus and rectum: Secondary | ICD-10-CM | POA: Diagnosis not present

## 2022-08-09 NOTE — Progress Notes (Signed)
Acute Office Visit  Subjective:     Patient ID: Crystal Browning, female    DOB: 08-17-1933, 89 y.o.   MRN: [id number]  Chief Complaint  Patient presents with   Stool Color Change    HPI Here with daughter in law today. Patient is in today for blood with a bowel movement. She noticed bright read blood in the toliet bowel with a BM 2 days ago and not since then. She was constipated at the time that the bleeding occurred. She took some miralax with some relief. She denies rectal pain, itching, blood in stool, nausea, vomiting, diarrhea, fever, or abdominal pain. She continues to have daily hard, small stools.   She checks her blood pressure regularly at home. Reports 140-150s/80s at home. Her blood pressure is always significantly elevated when in the office. She denies focal weakness, chest pain, shortness of breath, edema, dizziness, palpations, visual disturbances, or HA.   ROS As per HPI.      Objective:    BP (!) 239/86   Pulse 60   Temp 97.6 F (36.4 C) (Temporal)   Ht 5' (1.524 m)   Wt 127 lb 6 oz (57.8 kg)   SpO2 97%   BMI 24.88 kg/m  BP Readings from Last 3 Encounters:  08/09/22 (!) 239/86  05/31/22 (!) 196/83  03/08/22 (!) 211/68      Physical Exam Vitals and nursing note reviewed.  Constitutional:      General: She is not in acute distress.    Appearance: Normal appearance. She is not ill-appearing, toxic-appearing or diaphoretic.  Cardiovascular:     Rate and Rhythm: Normal rate. Rhythm irregular.     Heart sounds: Normal heart sounds. No murmur heard. Pulmonary:     Effort: Pulmonary effort is normal. No respiratory distress.     Breath sounds: Normal breath sounds. No wheezing.  Abdominal:     General: Bowel sounds are normal. There is no distension.     Palpations: Abdomen is soft.     Tenderness: There is no abdominal tenderness. There is no guarding or rebound.  Musculoskeletal:     Right lower leg: No edema.     Left lower leg: No edema.   Skin:    General: Skin is warm and dry.  Neurological:     Mental Status: She is alert and oriented to person, place, and time. Mental status is at baseline.     Motor: No weakness.     Gait: Gait normal.  Psychiatric:        Mood and Affect: Mood normal.        Behavior: Behavior normal.     No results found for any visits on 08/09/22.      Assessment & Plan:   [place] was seen today for stool color change.  Diagnoses and all orders for this visit:  Rectal bleeding 1 episode with constipation, now resolved. Will check CBC and have patient return FOBT.  -     CBC with Differential/Platelet -     Fecal occult blood, imunochemical([facility])  Constipation, unspecified constipation type Discussed miralax prn, fiber, hydration.   Primary hypertension Significantly elevated today in office. Reports well controlled at home. Recently saw cardiology, reviewed note. Continue to monitor at home and notify for elevated readings.   Longstanding persistent atrial fibrillation (HCC) Rate controlled today. Managed by cardiology. She is on eliqus and metoprolol.   Return to office for new or worsening symptoms, or if symptoms persist.   The  patient indicates understanding of these issues and agrees with the plan.  Gwenlyn Perking, FNP

## 2022-08-10 ENCOUNTER — Other Ambulatory Visit: Payer: Self-pay | Admitting: Family Medicine

## 2022-08-10 DIAGNOSIS — R441 Visual hallucinations: Secondary | ICD-10-CM

## 2022-08-10 LAB — CBC WITH DIFFERENTIAL/PLATELET
Basophils Absolute: 0 10*3/uL (ref 0.0–0.2)
Basos: 0 %
EOS (ABSOLUTE): 0.2 10*3/uL (ref 0.0–0.4)
Eos: 3 %
Hematocrit: 40.5 % (ref 34.0–46.6)
Hemoglobin: 13.2 g/dL (ref 11.1–15.9)
Immature Grans (Abs): 0 10*3/uL (ref 0.0–0.1)
Immature Granulocytes: 0 %
Lymphocytes Absolute: 1.6 10*3/uL (ref 0.7–3.1)
Lymphs: 28 %
MCH: 31.9 pg (ref 26.6–33.0)
MCHC: 32.6 g/dL (ref 31.5–35.7)
MCV: 98 fL — ABNORMAL HIGH (ref 79–97)
Monocytes Absolute: 0.6 10*3/uL (ref 0.1–0.9)
Monocytes: 10 %
Neutrophils Absolute: 3.2 10*3/uL (ref 1.4–7.0)
Neutrophils: 59 %
Platelets: 204 10*3/uL (ref 150–450)
RBC: 4.14 x10E6/uL (ref 3.77–5.28)
RDW: 13.1 % (ref 11.7–15.4)
WBC: 5.6 10*3/uL (ref 3.4–10.8)

## 2022-08-14 ENCOUNTER — Encounter: Payer: Self-pay | Admitting: Family Medicine

## 2022-08-16 ENCOUNTER — Ambulatory Visit: Payer: Medicare Other | Admitting: Family Medicine

## 2022-08-16 ENCOUNTER — Other Ambulatory Visit: Payer: Medicare Other

## 2022-08-16 ENCOUNTER — Telehealth: Payer: Self-pay | Admitting: Family Medicine

## 2022-08-16 DIAGNOSIS — K625 Hemorrhage of anus and rectum: Secondary | ICD-10-CM | POA: Diagnosis not present

## 2022-08-16 NOTE — Telephone Encounter (Signed)
When patient calls back, they need to schedule an appointment to be seen for this.  Left message to call back.

## 2022-08-16 NOTE — Telephone Encounter (Signed)
Family wants to know if Crystal Browning can add urine test to visit she had on 08/09/22 because they believe she could have UTI.   Please advise and call patient or family back.  [phone]

## 2022-08-17 LAB — FECAL OCCULT BLOOD, IMMUNOCHEMICAL: Fecal Occult Bld: NEGATIVE

## 2022-08-21 ENCOUNTER — Telehealth: Payer: Self-pay | Admitting: Family Medicine

## 2022-08-21 NOTE — Telephone Encounter (Signed)
[place]

## 2022-08-21 NOTE — Telephone Encounter (Signed)
Family wants to know what pt can take OTC to help her stomach until she can come in for her appt on 2/27 to get refills on her FAMOTIDINE.  Please advise.

## 2022-08-21 NOTE — Telephone Encounter (Signed)
Famotidine is available OTC.

## 2022-08-27 ENCOUNTER — Telehealth: Payer: Self-pay | Admitting: Family Medicine

## 2022-08-27 NOTE — Telephone Encounter (Signed)
Lmtcb.

## 2022-08-28 NOTE — Telephone Encounter (Signed)
Advised daughter that her neurologist Dr Merlene Laughter has retired.

## 2022-09-03 ENCOUNTER — Telehealth: Payer: Self-pay | Admitting: Family Medicine

## 2022-09-03 NOTE — Telephone Encounter (Signed)
Called patient to schedule Medicare Annual Wellness Visit (AWV). Left message for patient to call back and schedule Medicare Annual Wellness Visit (AWV).  Last date of AWV: 01/18/2021   Please schedule an appointment at any time with [facility].  If any questions, please contact me at [phone].  Thank you,  [place] ??[phone]

## 2022-09-03 NOTE — Telephone Encounter (Signed)
Contacted Crystal Browning to schedule their annual wellness visit. Appointment made for 09/05/2022.   Thank you,  [place] ??[phone]

## 2022-09-05 ENCOUNTER — Ambulatory Visit (INDEPENDENT_AMBULATORY_CARE_PROVIDER_SITE_OTHER): Payer: Medicare Other

## 2022-09-05 VITALS — Ht 61.0 in | Wt 130.0 lb

## 2022-09-05 DIAGNOSIS — Z0000 Encounter for general adult medical examination without abnormal findings: Secondary | ICD-10-CM

## 2022-09-05 NOTE — Patient Instructions (Signed)
Crystal Browning , Thank you for taking time to come for your Medicare Wellness Visit. I appreciate your ongoing commitment to your health goals. Please review the following plan we discussed and let me know if I can assist you in the future.   These are the goals we discussed:  Goals      Exercise 3x per week (30 min per time)        This is a list of the screening recommended for you and due dates:  Health Maintenance  Topic Date Due   COVID-19 Vaccine (1) Never done   DTaP/Tdap/Td vaccine (1 - Tdap) Never done   Zoster (Shingles) Vaccine (1 of 2) Never done   Pneumonia Vaccine (2 of 2 - PCV) 10/04/2020   Flu Shot  10/14/2022*   Medicare Annual Wellness Visit  09/06/2023   DEXA scan (bone density measurement)  Completed   HPV Vaccine  Aged Out  *Topic was postponed. The date shown is not the original due date.    Advanced directives: Advance directive discussed with you today. I have provided a copy for you to complete at home and have notarized. Once this is complete please bring a copy in to our office so we can scan it into your chart.   Conditions/risks identified: Aim for 30 minutes of exercise or brisk walking, 6-8 glasses of water, and 5 servings of fruits and vegetables each day.   Next appointment: Follow up in one year for your annual wellness visit    Preventive Care 65 Years and Older, Female Preventive care refers to lifestyle choices and visits with your health care provider that can promote health and wellness. What does preventive care include? A yearly physical exam. This is also called an annual well check. Dental exams once or twice a year. Routine eye exams. Ask your health care provider how often you should have your eyes checked. Personal lifestyle choices, including: Daily care of your teeth and gums. Regular physical activity. Eating a healthy diet. Avoiding tobacco and drug use. Limiting alcohol use. Practicing safe sex. Taking low-dose aspirin every  day. Taking vitamin and mineral supplements as recommended by your health care provider. What happens during an annual well check? The services and screenings done by your health care provider during your annual well check will depend on your age, overall health, lifestyle risk factors, and family history of disease. Counseling  Your health care provider may ask you questions about your: Alcohol use. Tobacco use. Drug use. Emotional well-being. Home and relationship well-being. Sexual activity. Eating habits. History of falls. Memory and ability to understand (cognition). Work and work Statistician. Reproductive health. Screening  You may have the following tests or measurements: Height, weight, and BMI. Blood pressure. Lipid and cholesterol levels. These may be checked every 5 years, or more frequently if you are over 77 years old. Skin check. Lung cancer screening. You may have this screening every year starting at age 52 if you have a 30-pack-year history of smoking and currently smoke or have quit within the past 15 years. Fecal occult blood test (FOBT) of the stool. You may have this test every year starting at age 27. Flexible sigmoidoscopy or colonoscopy. You may have a sigmoidoscopy every 5 years or a colonoscopy every 10 years starting at age 7. Hepatitis C blood test. Hepatitis B blood test. Sexually transmitted disease (STD) testing. Diabetes screening. This is done by checking your blood sugar (glucose) after you have not eaten for a while (fasting). You  may have this done every 1-3 years. Bone density scan. This is done to screen for osteoporosis. You may have this done starting at age 6. Mammogram. This may be done every 1-2 years. Talk to your health care provider about how often you should have regular mammograms. Talk with your health care provider about your test results, treatment options, and if necessary, the need for more tests. Vaccines  Your health care  provider may recommend certain vaccines, such as: Influenza vaccine. This is recommended every year. Tetanus, diphtheria, and acellular pertussis (Tdap, Td) vaccine. You may need a Td booster every 10 years. Zoster vaccine. You may need this after age 23. Pneumococcal 13-valent conjugate (PCV13) vaccine. One dose is recommended after age 41. Pneumococcal polysaccharide (PPSV23) vaccine. One dose is recommended after age 51. Talk to your health care provider about which screenings and vaccines you need and how often you need them. This information is not intended to replace advice given to you by your health care provider. Make sure you discuss any questions you have with your health care provider. Document Released: 07/29/2015 Document Revised: 03/21/2016 Document Reviewed: 05/03/2015 Elsevier Interactive Patient Education  2017 [place] Prevention in the Home Falls can cause injuries. They can happen to people of all ages. There are many things you can do to make your home safe and to help prevent falls. What can I do on the outside of my home? Regularly fix the edges of walkways and driveways and fix any cracks. Remove anything that might make you trip as you walk through a door, such as a raised step or threshold. Trim any bushes or trees on the path to your home. Use bright outdoor lighting. Clear any walking paths of anything that might make someone trip, such as rocks or tools. Regularly check to see if handrails are loose or broken. Make sure that both sides of any steps have handrails. Any raised decks and porches should have guardrails on the edges. Have any leaves, snow, or ice cleared regularly. Use sand or salt on walking paths during winter. Clean up any spills in your garage right away. This includes oil or grease spills. What can I do in the bathroom? Use night lights. Install grab bars by the toilet and in the tub and shower. Do not use towel bars as grab  bars. Use non-skid mats or decals in the tub or shower. If you need to sit down in the shower, use a plastic, non-slip stool. Keep the floor dry. Clean up any water that spills on the floor as soon as it happens. Remove soap buildup in the tub or shower regularly. Attach bath mats securely with double-sided non-slip rug tape. Do not have throw rugs and other things on the floor that can make you trip. What can I do in the bedroom? Use night lights. Make sure that you have a light by your bed that is easy to reach. Do not use any sheets or blankets that are too big for your bed. They should not hang down onto the floor. Have a firm chair that has side arms. You can use this for support while you get dressed. Do not have throw rugs and other things on the floor that can make you trip. What can I do in the kitchen? Clean up any spills right away. Avoid walking on wet floors. Keep items that you use a lot in easy-to-reach places. If you need to reach something above you, use a strong  step stool that has a grab bar. Keep electrical cords out of the way. Do not use floor polish or wax that makes floors slippery. If you must use wax, use non-skid floor wax. Do not have throw rugs and other things on the floor that can make you trip. What can I do with my stairs? Do not leave any items on the stairs. Make sure that there are handrails on both sides of the stairs and use them. Fix handrails that are broken or loose. Make sure that handrails are as long as the stairways. Check any carpeting to make sure that it is firmly attached to the stairs. Fix any carpet that is loose or worn. Avoid having throw rugs at the top or bottom of the stairs. If you do have throw rugs, attach them to the floor with carpet tape. Make sure that you have a light switch at the top of the stairs and the bottom of the stairs. If you do not have them, ask someone to add them for you. What else can I do to help prevent  falls? Wear shoes that: Do not have high heels. Have rubber bottoms. Are comfortable and fit you well. Are closed at the toe. Do not wear sandals. If you use a stepladder: Make sure that it is fully opened. Do not climb a closed stepladder. Make sure that both sides of the stepladder are locked into place. Ask someone to hold it for you, if possible. Clearly mark and make sure that you can see: Any grab bars or handrails. First and last steps. Where the edge of each step is. Use tools that help you move around (mobility aids) if they are needed. These include: Canes. Walkers. Scooters. Crutches. Turn on the lights when you go into a dark area. Replace any light bulbs as soon as they burn out. Set up your furniture so you have a clear path. Avoid moving your furniture around. If any of your floors are uneven, fix them. If there are any pets around you, be aware of where they are. Review your medicines with your doctor. Some medicines can make you feel dizzy. This can increase your chance of falling. Ask your doctor what other things that you can do to help prevent falls. This information is not intended to replace advice given to you by your health care provider. Make sure you discuss any questions you have with your health care provider. Document Released: 04/28/2009 Document Revised: 12/08/2015 Document Reviewed: 08/06/2014 Elsevier Interactive Patient Education  2017 [employer].

## 2022-09-05 NOTE — Progress Notes (Signed)
Subjective:   Crystal Browning is a 89 y.o. female who presents for Medicare Annual (Subsequent) preventive examination. I connected with  Crystal Browning on 09/05/22 by a audio enabled telemedicine application and verified that I am speaking with the correct person using two identifiers.  Patient Location: Home  Provider Location: Home Office  I discussed the limitations of evaluation and management by telemedicine. The patient expressed understanding and agreed to proceed.  Review of Systems     Cardiac Risk Factors include: advanced age (>2mn, >>34women);hypertension;dyslipidemia     Objective:    Today's Vitals   09/05/22 1439  Weight: 130 lb (59 kg)  Height: 5' 1"$  (1.549 m)   Body mass index is 24.56 kg/m.     09/05/2022    2:43 PM 09/21/2021    6:08 PM 04/29/2021    3:55 PM 03/14/2021    6:35 PM 01/13/2021    8:21 PM 08/28/2020    9:34 PM 07/18/2020    3:35 PM  Advanced Directives  Does Patient Have a Medical Advance Directive? No No No No No No No  Would patient like information on creating a medical advance directive? No - Patient declined No - Patient declined No - Patient declined No - Patient declined No - Patient declined  No - Patient declined    Current Medications (verified) Outpatient Encounter Medications as of 09/05/2022  Medication Sig   acetaminophen (TYLENOL) 325 MG tablet Take 2 tablets (650 mg total) by mouth every 6 (six) hours as needed for mild pain, fever or headache.   apixaban (ELIQUIS) 2.5 MG TABS tablet Take 1 tablet (2.5 mg total) by mouth 2 (two) times daily.   donepezil (ARICEPT) 10 MG tablet Take 10 mg by mouth daily.   escitalopram (LEXAPRO) 10 MG tablet TAKE 1 TABLET BY MOUTH EVERY DAY   estradiol (ESTRACE VAGINAL) 0.1 MG/GM vaginal cream Place 1 Applicatorful vaginally at bedtime.   famotidine (PEPCID) 20 MG tablet Take 1 tablet (20 mg total) by mouth every 12 (twelve) hours. (Patient taking differently: Take 20 mg by mouth daily.)    levothyroxine (SYNTHROID) 50 MCG tablet TAKE 1 TABLET BY MOUTH EVERY DAY   metoprolol succinate (TOPROL-XL) 100 MG 24 hr tablet TAKE 1 TABLET DAILY WITH OR IMMEDIATELY FOLLOWING A MEAL.   OVER THE COUNTER MEDICATION Vitamin b 12   QUEtiapine (SEROQUEL) 50 MG tablet TAKE 1 TABLET BY MOUTH EVERYDAY AT BEDTIME   solifenacin (VESICARE) 5 MG tablet Take 1 tablet (5 mg total) by mouth daily.   triamterene-hydrochlorothiazide (MAXZIDE-25) 37.5-25 MG tablet Take 1 tablet by mouth daily. For blood pressure and fluid   vitamin B-12 (CYANOCOBALAMIN) 1000 MCG tablet Take 1,000 mcg by mouth daily.   No facility-administered encounter medications on file as of 09/05/2022.    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 Constance Holster   TOTAL ABDOMINAL HYSTERECTOMY W/ BILATERAL SALPINGOOPHORECTOMY  1986   TUBAL LIGATION     Family History  Problem Relation Age of Onset   Stroke Mother    Heart disease Mother 44   Cancer Father 88       Prostate   Alcohol abuse Brother    COPD Brother    Social History   Socioeconomic History   Marital status: Widowed    Spouse name: Not on file   Number of children: 6   Years of education: Not on file   Highest education level: Not on file  Occupational History   Not on file  Tobacco Use   Smoking status: Never   Smokeless tobacco: Never  Vaping Use   Vaping Use: Never used  Substance and Sexual Activity   Alcohol use: No   Drug use: No   Sexual activity: Not on file  Other Topics Concern   Not on file  Social History Narrative   Lives alone in her mobile home. Children live nearby - they take her to appts and help out as needed.   Social Determinants of Health   Financial Resource Strain: Low Risk  (09/05/2022)   Overall Financial Resource Strain (CARDIA)    Difficulty of Paying Living Expenses: Not hard at all  Food Insecurity: No Food Insecurity (09/05/2022)   Hunger Vital Sign    Worried About Running Out of Food in the Last Year: Never true    Ran Out of Food in the Last Year: Never true  Transportation Needs: No Transportation Needs (09/05/2022)   PRAPARE - Hydrologist (Medical): No    Lack of Transportation (Non-Medical): No  Physical Activity: Insufficiently Active (09/05/2022)   Exercise Vital Sign    Days of Exercise per Week: 3 days    Minutes of Exercise per Session: 30 min  Stress: No Stress Concern Present (09/05/2022)   [place]    Feeling of Stress : Not at all  Social Connections: Socially Isolated (09/05/2022)   Social Connection and Isolation Panel [NHANES]    Frequency of Communication with Friends and Family: More than three times a week    Frequency of Social Gatherings with Friends and Family: More than three times a week    Attends Religious Services: Never    Marine scientist or Organizations: No    Attends Archivist Meetings: Never    Marital Status: Widowed    Tobacco Counseling Counseling given: Not Answered   Clinical Intake:  Pre-visit preparation completed: Yes  Pain : No/denies pain     Nutritional Risks: None  Diabetes: No  How often do you need to have someone help you when you read instructions, pamphlets, or other written materials from your doctor or pharmacy?: 1 - Never  Diabetic?no   Interpreter Needed?: No  Information entered by :: Jadene Pierini, LPN   Activities of Daily Living    09/05/2022    2:44 PM  In your present state of health, do you have any difficulty performing the following activities:  Hearing? 0  Vision? 0  Difficulty concentrating or making decisions? 0  Walking or climbing stairs? 0  Dressing or bathing? 0  Doing errands, shopping? 0  Preparing Food and eating ? N  Using the Toilet? N  In the past six months, have you accidently leaked urine? N  Do you have problems with loss of bowel control? N  Managing your Medications? N  Managing your Finances? N  Housekeeping or managing your Housekeeping? N    Patient Care Team: [name], MD as PCP - General (Family Medicine)  Indicate any recent Medical Services you may have received from other than [facility] providers in the past year (date may be approximate).     Assessment:   This is a routine wellness examination for Balinda.  Hearing/Vision screen Vision Screening - Comments:: Wears rx glasses - up to date with routine eye exams with  Dr.Johnson   Dietary  issues and exercise activities discussed: Current Exercise Habits: Home exercise routine, Type of exercise: walking, Time (Minutes): 30, Frequency (Times/Week): 3, Weekly Exercise (Minutes/Week): 90, Intensity: Mild, Exercise limited by: None identified   Goals Addressed             This Visit's Progress    Exercise 3x per week (30 min per time)   On track      Depression Screen    09/05/2022    2:43 PM 08/09/2022    3:42 PM 02/20/2022    3:27 PM 12/14/2021    2:03 PM 09/28/2021    3:54 PM 09/28/2021    3:46 PM 09/21/2021    4:32 PM  PHQ 2/9 Scores  PHQ - 2 Score 0 0 0  4 0 2  PHQ- 9 Score 0 3   17  4  $ Exception Documentation    Medical reason       Fall Risk    09/05/2022    2:40 PM 08/09/2022    3:42 PM 02/20/2022    3:36 PM 02/20/2022    3:27 PM 12/14/2021    2:02 PM  Fall Risk   Falls in the past year? 0 1 0 0 1  Number falls in past yr: 0 1   1  Injury with Fall? 0 0   1  Risk for fall due to : No Fall Risks History of fall(s)   Mental status change  Follow up Falls prevention discussed Falls evaluation completed   Falls evaluation completed    [place]:  Any stairs in or around the home? No  If so, are there any without handrails? No  Home free of loose throw rugs in walkways, pet beds, electrical cords, etc? Yes  Adequate lighting in your home to reduce risk of falls? Yes   ASSISTIVE DEVICES UTILIZED TO PREVENT FALLS:  Life alert? No  Use of a cane, walker or w/c? No  Grab bars in the bathroom? No  Shower chair or bench in shower? No  Elevated toilet seat or a handicapped toilet? No  02/27/2021   11:56 AM 12/07/2020    2:39 PM 06/29/2020   10:34 AM  MMSE - Mini Mental State Exam  Orientation to time 4 5 4  $ Orientation to Place 0 2 4  Registration 3 3 3  $ Attention/ Calculation 4 0 2  Recall 1 1 3  $ Language- name 2 objects 2 2 2  $ Language- repeat 1 1 0  Language- follow 3 step command 3 2 3  $ Language- read & follow  direction 0 1 1  Write a sentence 1 0 0  Copy design 0 0 0  Total score 19 17 22        $ 09/05/2022    2:44 PM  6CIT Screen  What Year? 0 points  What month? 0 points  What time? 3 points  Count back from 20 2 points  Months in reverse 2 points  Repeat phrase 4 points  Total Score 11 points    Immunizations Immunization History  Administered Date(s) Administered   Fluad Quad(high Dose 65+) 10/05/2019, 06/02/2020, 06/28/2021   Influenza Inj Mdck Quad Pf 10/05/2019   Influenza Split 05/27/2006   Influenza, High Dose Seasonal PF 06/11/2018   Pneumococcal Polysaccharide-23 10/05/2019    TDAP status: Due, Education has been provided regarding the importance of this vaccine. Advised may receive this vaccine at local pharmacy or Health Dept. Aware to provide a copy of the vaccination record if obtained from local pharmacy or Health Dept. Verbalized acceptance and understanding.  Flu Vaccine status: Declined, Education has been provided regarding the importance of this vaccine but patient still declined. Advised may receive this vaccine at local pharmacy or Health Dept. Aware to provide a copy of the vaccination record if obtained from local pharmacy or Health Dept. Verbalized acceptance and understanding.  Pneumococcal vaccine status: Up to date  Covid-19 vaccine status: Declined, Education has been provided regarding the importance of this vaccine but patient still declined. Advised may receive this vaccine at local pharmacy or Health Dept.or vaccine clinic. Aware to provide a copy of the vaccination record if obtained from local pharmacy or Health Dept. Verbalized acceptance and understanding.  Qualifies for Shingles Vaccine? Yes   Zostavax completed No   Shingrix Completed?: No.    Education has been provided regarding the importance of this vaccine. Patient has been advised to call insurance company to determine out of pocket expense if they have not yet received this vaccine.  Advised may also receive vaccine at local pharmacy or Health Dept. Verbalized acceptance and understanding.  Screening Tests Health Maintenance  Topic Date Due   COVID-19 Vaccine (1) Never done   DTaP/Tdap/Td (1 - Tdap) Never done   Zoster Vaccines- Shingrix (1 of 2) Never done   Pneumonia Vaccine 72+ Years old (2 of 2 - PCV) 10/04/2020   INFLUENZA VACCINE  10/14/2022 (Originally 02/13/2022)   Medicare Annual Wellness (AWV)  09/06/2023   DEXA SCAN  Completed   HPV VACCINES  Aged Out    Health Maintenance  Health Maintenance Due  Topic Date Due   COVID-19 Vaccine (1) Never done   DTaP/Tdap/Td (1 - Tdap) Never done   Zoster Vaccines- Shingrix (1 of 2) Never done   Pneumonia Vaccine 9+ Years old (2 of 2 - PCV) 10/04/2020    Colorectal cancer screening: No longer required.   Mammogram status: No longer required due to age.  Bone Density status: Ordered declined . Pt provided with contact info and advised to call to schedule appt.  Lung Cancer Screening: (Low  Dose CT Chest recommended if Age 4-80 years, 30 pack-year currently smoking OR have quit w/in 15years.) does not qualify.   Lung Cancer Screening Referral: n/a  Additional Screening:  Hepatitis C Screening: does not qualify;   Vision Screening: Recommended annual ophthalmology exams for early detection of glaucoma and other disorders of the eye. Is the patient up to date with their annual eye exam?  Yes  Who is the provider or what is the name of the office in which the patient attends annual eye exams? Dr.Johnson  If pt is not established with a provider, would they like to be referred to a provider to establish care? No .   Dental Screening: Recommended annual dental exams for proper oral hygiene  Community Resource Referral / Chronic Care Management: CRR required this visit?  No   CCM required this visit?  No      Plan:     I have personally reviewed and noted the following in the patient's chart:    Medical and social history Use of alcohol, tobacco or illicit drugs  Current medications and supplements including opioid prescriptions. Patient is not currently taking opioid prescriptions. Functional ability and status Nutritional status Physical activity Advanced directives List of other physicians Hospitalizations, surgeries, and ER visits in previous 12 months Vitals Screenings to include cognitive, depression, and falls Referrals and appointments  In addition, I have reviewed and discussed with patient certain preventive protocols, quality metrics, and best practice recommendations. A written personalized care plan for preventive services as well as general preventive health recommendations were provided to patient.     Daphane Shepherd, LPN   [id number]   Nurse Notes: Due TDAP Vaccine

## 2022-09-11 ENCOUNTER — Ambulatory Visit: Payer: Medicare Other | Admitting: Family Medicine

## 2022-09-18 ENCOUNTER — Telehealth: Payer: Self-pay | Admitting: Family Medicine

## 2022-09-18 NOTE — Telephone Encounter (Signed)
Patient has appointment tomorrow, 09/19/22

## 2022-09-18 NOTE — Telephone Encounter (Signed)
Patient has appointment on 3/6, and her daughter in law wanted to let her PCP know that she will not be with her for the appointment but that she has been breaking out on her arms and she spoke with the patient's doctor that has been helping her with her dementia diagnosis - they told her to start only taking half of her acrocef pill so they have been breaking them in half to give her. This medication is not prescribed by PCP but she wanted him to be aware. She also said that the QUEtiapine (SEROQUEL) 50 MG tablet has started to make the patient act "drunk" so they have only been giving it to her every other day instead of every single day.   Needs to be sure that more of her medication is called in that helps her stomach, they were not sure of the name of this medication but said they were told that she needed to see her PCP before more could be called in.   Does not need a call back unless PCP has questions. Just wanted him to be aware for patient's appointment on 3/6

## 2022-09-19 ENCOUNTER — Ambulatory Visit: Payer: Medicare Other | Admitting: Family Medicine

## 2022-09-21 ENCOUNTER — Encounter: Payer: Self-pay | Admitting: Family Medicine

## 2022-09-26 ENCOUNTER — Encounter: Payer: Self-pay | Admitting: Family Medicine

## 2022-09-26 ENCOUNTER — Ambulatory Visit (INDEPENDENT_AMBULATORY_CARE_PROVIDER_SITE_OTHER): Payer: Medicare Other | Admitting: Family Medicine

## 2022-09-26 VITALS — BP 199/86 | HR 50 | Temp 97.0°F | Ht 61.0 in | Wt 125.8 lb

## 2022-09-26 DIAGNOSIS — G301 Alzheimer's disease with late onset: Secondary | ICD-10-CM | POA: Diagnosis not present

## 2022-09-26 DIAGNOSIS — I10 Essential (primary) hypertension: Secondary | ICD-10-CM | POA: Diagnosis not present

## 2022-09-26 DIAGNOSIS — F02B2 Dementia in other diseases classified elsewhere, moderate, with psychotic disturbance: Secondary | ICD-10-CM | POA: Diagnosis not present

## 2022-09-26 DIAGNOSIS — R300 Dysuria: Secondary | ICD-10-CM | POA: Diagnosis not present

## 2022-09-26 DIAGNOSIS — I4811 Longstanding persistent atrial fibrillation: Secondary | ICD-10-CM

## 2022-09-26 DIAGNOSIS — E039 Hypothyroidism, unspecified: Secondary | ICD-10-CM | POA: Diagnosis not present

## 2022-09-26 LAB — MICROSCOPIC EXAMINATION
RBC, Urine: NONE SEEN /hpf (ref 0–2)
Renal Epithel, UA: NONE SEEN /hpf

## 2022-09-26 LAB — URINALYSIS, COMPLETE
Bilirubin, UA: NEGATIVE
Glucose, UA: NEGATIVE
Nitrite, UA: NEGATIVE
Protein,UA: NEGATIVE
RBC, UA: NEGATIVE
Specific Gravity, UA: 1.025 (ref 1.005–1.030)
Urobilinogen, Ur: 0.2 mg/dL (ref 0.2–1.0)
pH, UA: 5.5 (ref 5.0–7.5)

## 2022-09-26 NOTE — Progress Notes (Signed)
Subjective:  Patient ID: Crystal Browning, female    DOB: January 12, 1934  Age: 89 y.o. MRN: [id number]  CC: Medical Management of Chronic Issues   HPI Crystal Browning presents for reaction to donepezil and quetiapine. Had a lot of swelling in the right arm. Seemed confused.Having hallucinations. Cut the two meds to qohs and arm is back to normal. More back to herself and not hallucinating.Was clawing at her skin until med decreased.  Burning with urination. One of the daughters decided to cut ack on the vesicare and give it prn. Not sure what criteria she chose to use. Now having dysuria and overflow incontinence.     09/26/2022   11:42 AM 09/26/2022   11:19 AM 09/05/2022    2:43 PM  Depression screen PHQ 2/9  Decreased Interest 2 0 0  Down, Depressed, Hopeless 0 0 0  PHQ - 2 Score 2 0 0  Altered sleeping 2  0  Tired, decreased energy 1  0  Change in appetite 1  0  Feeling bad or failure about yourself  0  0  Trouble concentrating 0  0  Moving slowly or fidgety/restless 0  0  Suicidal thoughts 0  0  PHQ-9 Score 6  0  Difficult doing work/chores Not difficult at all  Not difficult at all    History Crystal Browning has a past medical history of Arthritis, Atrial fibrillation ([place]) (10/30/2016), Bilateral lower extremity edema (10/30/2016), Cancer ([place]) (03/28/2011), CAP (community acquired pneumonia) (10/04/2019), Dementia ([place]), Depression, Diverticulosis (02/2005), Esophageal stricture (03/03/2018), Generalized abdominal pain (07/17/2015), History of chemotherapy, History of external beam radiation therapy (08/20/2016), Hypertension, Hypothyroid, Osteoporosis, 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   She has a past surgical history that includes Direct laryngoscopy (03/27/2005); Tubal ligation; Total abdominal hysterectomy w/ bilateral salpingoophorectomy (1986); Cataract extraction, bilateral; and Cardioversion (N/A, 11/23/2016).   Her  family history includes Alcohol abuse in her brother; COPD in her brother; Cancer (age of onset: 12) in her father; Heart disease (age of onset: 14) in her mother; Stroke in her mother.She reports that she has never smoked. She has never used smokeless tobacco. She reports that she does not drink alcohol and does not use drugs.    ROS Review of Systems  Constitutional: Negative.   HENT: Negative.    Eyes:  Negative for visual disturbance.  Respiratory:  Negative for shortness of breath.   Cardiovascular:  Negative for chest pain.  Gastrointestinal:  Negative for abdominal pain.  Musculoskeletal:  Negative for arthralgias.    Objective:  BP (!) 199/86   Pulse (!) 50   Temp (!) 97 F (36.1 C)   Ht '5\' 1"'$  (1.549 m)   Wt 125 lb 12.8 oz (57.1 kg)   SpO2 99%   BMI 23.77 kg/m   BP Readings from Last 3 Encounters:  09/26/22 (!) 199/86  08/09/22 (!) 239/86  05/31/22 (!) 196/83    Wt Readings from Last 3 Encounters:  09/26/22 125 lb 12.8 oz (57.1 kg)  09/05/22 130 lb (59 kg)  08/09/22 127 lb 6 oz (57.8 kg)     Physical Exam Constitutional:      General: She is not in acute distress.    Appearance: She is well-developed.  Cardiovascular:     Rate and Rhythm: Normal rate and regular rhythm.  Pulmonary:     Breath sounds: Normal breath sounds.  Musculoskeletal:        General: Normal range of motion.  Skin:    General: Skin is warm and dry.  Neurological:     Mental Status: She is alert and oriented to person, place, and time.  Psychiatric:        Mood and Affect: Mood normal.        Cognition and Memory: Cognition is impaired. Memory is impaired.    UA - no sign of infection   Assessment & Plan:   Crystal Browning was seen today for medical management of chronic issues.  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 Needs to take vesicare daily or use incontinence garment or both.     I am having Crystal Browning maintain her acetaminophen, famotidine, OVER THE COUNTER MEDICATION, cyanocobalamin, donepezil, estradiol, triamterene-hydrochlorothiazide, apixaban, levothyroxine, solifenacin, escitalopram, metoprolol succinate, and QUEtiapine.  Allergies as of 09/26/2022       Reactions   Amlodipine Besylate Hives   Hives and confusion.   Tuberculin Tests Swelling   Nifedipine Swelling   Claritin [loratadine] Rash   Pt's daughter states she is allergic to claritin   Paroxetine Hcl Rash   Vit D-vit E-safflower Oil         Medication List        Accurate as of September 26, 2022 12:15 PM. If you have any questions, ask your nurse or doctor.          acetaminophen 325 MG tablet Commonly known as: TYLENOL Take 2 tablets (650 mg total) by mouth every 6 (six) hours as needed for mild pain, fever or headache.   apixaban 2.5 MG Tabs tablet Commonly known as: ELIQUIS Take 1 tablet (2.5 mg total) by mouth 2 (two) times daily.   cyanocobalamin 1000 MCG tablet Commonly known as: VITAMIN B12 Take 1,000 mcg by mouth daily.   donepezil 10 MG tablet Commonly known as: ARICEPT Take 10 mg by mouth every other day.   escitalopram 10 MG tablet Commonly known as: LEXAPRO TAKE 1 TABLET BY MOUTH EVERY DAY   estradiol 0.1 MG/GM vaginal cream Commonly known as: ESTRACE VAGINAL Place 1 Applicatorful vaginally at bedtime.   famotidine 20 MG  tablet Commonly known as: PEPCID Take 1 tablet (20 mg total) by mouth every 12 (twelve) hours. What changed: when to take this   levothyroxine 50 MCG tablet Commonly known as: SYNTHROID TAKE 1 TABLET BY MOUTH EVERY DAY   metoprolol succinate 100 MG 24 hr tablet Commonly known as: TOPROL-XL TAKE 1 TABLET DAILY WITH OR IMMEDIATELY FOLLOWING A MEAL.   OVER THE COUNTER MEDICATION Vitamin b 12   QUEtiapine 50 MG tablet Commonly known as: SEROQUEL TAKE 1 TABLET BY MOUTH EVERYDAY AT BEDTIME   solifenacin 5  MG tablet Commonly known as: VESICARE Take 1 tablet (5 mg total) by mouth daily.   triamterene-hydrochlorothiazide 37.5-25 MG tablet Commonly known as: MAXZIDE-25 Take 1 tablet by mouth daily. For blood pressure and fluid         Follow-up: Return in about 6 months (around 03/29/2023), or if symptoms worsen or fail to improve.  [name], M.D.

## 2022-09-27 ENCOUNTER — Other Ambulatory Visit: Payer: Self-pay | Admitting: *Deleted

## 2022-09-27 ENCOUNTER — Telehealth: Payer: Self-pay | Admitting: Family Medicine

## 2022-09-27 DIAGNOSIS — R1314 Dysphagia, pharyngoesophageal phase: Secondary | ICD-10-CM

## 2022-09-27 LAB — CBC WITH DIFFERENTIAL/PLATELET
Basophils Absolute: 0 10*3/uL (ref 0.0–0.2)
Basos: 0 %
EOS (ABSOLUTE): 0.2 10*3/uL (ref 0.0–0.4)
Eos: 4 %
Hematocrit: 38.5 % (ref 34.0–46.6)
Hemoglobin: 12.7 g/dL (ref 11.1–15.9)
Immature Grans (Abs): 0 10*3/uL (ref 0.0–0.1)
Immature Granulocytes: 0 %
Lymphocytes Absolute: 1.3 10*3/uL (ref 0.7–3.1)
Lymphs: 24 %
MCH: 32.4 pg (ref 26.6–33.0)
MCHC: 33 g/dL (ref 31.5–35.7)
MCV: 98 fL — ABNORMAL HIGH (ref 79–97)
Monocytes Absolute: 0.6 10*3/uL (ref 0.1–0.9)
Monocytes: 11 %
Neutrophils Absolute: 3.4 10*3/uL (ref 1.4–7.0)
Neutrophils: 61 %
Platelets: 243 10*3/uL (ref 150–450)
RBC: 3.92 x10E6/uL (ref 3.77–5.28)
RDW: 12.6 % (ref 11.7–15.4)
WBC: 5.5 10*3/uL (ref 3.4–10.8)

## 2022-09-27 LAB — TSH+FREE T4
Free T4: 1.27 ng/dL (ref 0.82–1.77)
TSH: 4.2 u[IU]/mL (ref 0.450–4.500)

## 2022-09-27 LAB — CMP14+EGFR
ALT: 12 IU/L (ref 0–32)
AST: 28 IU/L (ref 0–40)
Albumin/Globulin Ratio: 1.8 (ref 1.2–2.2)
Albumin: 4.1 g/dL (ref 3.7–4.7)
Alkaline Phosphatase: 103 IU/L (ref 44–121)
BUN/Creatinine Ratio: 15 (ref 12–28)
BUN: 12 mg/dL (ref 8–27)
Bilirubin Total: 0.4 mg/dL (ref 0.0–1.2)
CO2: 24 mmol/L (ref 20–29)
Calcium: 9 mg/dL (ref 8.7–10.3)
Chloride: 105 mmol/L (ref 96–106)
Creatinine, Ser: 0.78 mg/dL (ref 0.57–1.00)
Globulin, Total: 2.3 g/dL (ref 1.5–4.5)
Glucose: 86 mg/dL (ref 70–99)
Potassium: 4.2 mmol/L (ref 3.5–5.2)
Sodium: 139 mmol/L (ref 134–144)
Total Protein: 6.4 g/dL (ref 6.0–8.5)
eGFR: 74 mL/min/{1.73_m2} (ref >59)

## 2022-09-27 MED ORDER — FAMOTIDINE 20 MG PO TABS: 20.0000 mg | ORAL_TABLET | Freq: Two times a day (BID) | ORAL | 1 refills | Status: DC

## 2022-09-27 NOTE — Telephone Encounter (Signed)
Sent as requested.

## 2022-09-28 LAB — URINE CULTURE

## 2022-10-01 NOTE — Progress Notes (Signed)
Hello [place],  Your lab result is normal and/or stable.Some minor variations that are not significant are commonly marked abnormal, but do not represent any medical problem for you.  Best regards, Donivin Wirt, M.D.

## 2022-10-31 ENCOUNTER — Other Ambulatory Visit: Payer: Self-pay | Admitting: Family Medicine

## 2022-10-31 DIAGNOSIS — I10 Essential (primary) hypertension: Secondary | ICD-10-CM

## 2022-10-31 DIAGNOSIS — I4891 Unspecified atrial fibrillation: Secondary | ICD-10-CM

## 2022-10-31 DIAGNOSIS — F419 Anxiety disorder, unspecified: Secondary | ICD-10-CM

## 2022-11-05 ENCOUNTER — Other Ambulatory Visit: Payer: Self-pay | Admitting: Family Medicine

## 2022-11-05 DIAGNOSIS — R441 Visual hallucinations: Secondary | ICD-10-CM

## 2022-11-08 ENCOUNTER — Telehealth: Payer: Self-pay | Admitting: Family Medicine

## 2022-11-08 NOTE — Telephone Encounter (Signed)
Son wants to know how pt should be taking her medications. He wants to know why pt was taken off her dementia rx for a week? He also wants to know why pt is taking her bipolar rx every other day.  He does not know that name of the rx. Please call back

## 2022-11-08 NOTE — Telephone Encounter (Signed)
Son says she isnt seeing specialist anymore.

## 2022-11-08 NOTE — Telephone Encounter (Signed)
I do not know of a medication she takes for bipolar dx. The seroquel is for anxiety at lower doses. One of the family members changes the dose a lot. Doesn't go through me. I recommend it daily.   She was not taken off of the dementia med for a week to my knowledge. However, one of the family members chose to take her to a specialist for this. I'm not sure who that is. It may have been the specialist.

## 2022-11-08 NOTE — Telephone Encounter (Signed)
Son aware.

## 2022-11-13 ENCOUNTER — Emergency Department (HOSPITAL_COMMUNITY)
Admission: EM | Admit: 2022-11-13 | Discharge: 2022-11-13 | Disposition: A | Discharge status: Home / Self Care | Payer: Medicare Other | Attending: Emergency Medicine | Admitting: Emergency Medicine

## 2022-11-13 ENCOUNTER — Other Ambulatory Visit: Payer: Self-pay

## 2022-11-13 ENCOUNTER — Encounter (HOSPITAL_COMMUNITY): Payer: Self-pay

## 2022-11-13 ENCOUNTER — Emergency Department (HOSPITAL_COMMUNITY): Payer: Medicare Other

## 2022-11-13 DIAGNOSIS — M5441 Lumbago with sciatica, right side: Secondary | ICD-10-CM | POA: Diagnosis not present

## 2022-11-13 DIAGNOSIS — N3000 Acute cystitis without hematuria: Secondary | ICD-10-CM

## 2022-11-13 DIAGNOSIS — M8588 Other specified disorders of bone density and structure, other site: Secondary | ICD-10-CM | POA: Diagnosis not present

## 2022-11-13 DIAGNOSIS — F0390 Unspecified dementia without behavioral disturbance: Secondary | ICD-10-CM | POA: Insufficient documentation

## 2022-11-13 DIAGNOSIS — M545 Low back pain, unspecified: Secondary | ICD-10-CM | POA: Diagnosis not present

## 2022-11-13 LAB — URINALYSIS, ROUTINE W REFLEX MICROSCOPIC
Bilirubin Urine: NEGATIVE
Glucose, UA: NEGATIVE mg/dL
Hgb urine dipstick: NEGATIVE
Ketones, ur: NEGATIVE mg/dL
Nitrite: NEGATIVE
Protein, ur: 30 mg/dL — AB
Specific Gravity, Urine: 1.017 (ref 1.005–1.030)
pH: 5 (ref 5.0–8.0)

## 2022-11-13 MED ORDER — CEPHALEXIN 500 MG PO CAPS: 500.0000 mg | ORAL_CAPSULE | Freq: Four times a day (QID) | ORAL | 0 refills | Status: DC

## 2022-11-13 MED ORDER — CEPHALEXIN 500 MG PO CAPS
500.0000 mg | ORAL_CAPSULE | Freq: Once | ORAL | Status: AC
  Administered 2022-11-13: 500 mg via ORAL
  Filled 2022-11-13: qty 1

## 2022-11-13 MED ORDER — PREDNISONE 10 MG (21) PO TBPK: ORAL_TABLET | Freq: Every day | ORAL | 0 refills | Status: DC

## 2022-11-13 NOTE — ED Triage Notes (Addendum)
Pt BIB daughter in law due to pt c/o lower back and leg pain that began yesterday. Pt denies any recent falls or injury that would cause pain. Pain is worse with movement and ambulation.   Pt has hx dementia.   R leg pain greater than L leg.   Second RN in triage, NIH 0. Pt A & O x 4.   Pt also states she has burning with urination x months.

## 2022-11-13 NOTE — Discharge Instructions (Signed)
You were evaluated today for low back pain which is consistent with lumbar radiculopathy (nerve irritation). I have prescribed a steroid.  I also prescribed an antibiotic for signs of a urinary tract infection. Please complete the course of antibiotics unless contacted and told otherwise.  Follow up with your primary care provider.

## 2022-11-13 NOTE — ED Provider Notes (Signed)
[place] EMERGENCY DEPARTMENT AT [facility] Provider Note   CSN: [id number] Arrival date & time: 11/13/22  1811     History  Chief Complaint  Patient presents with   Back Pain    Crystal Browning is a 89 y.o. female.  Patient presents to the emergency department complaining of nontraumatic back pain which began on Sunday.  Patient denies any recent falls or trauma.  She states that the pain is worse with movement and ambulation.  Pain radiates down the right leg.  Patient does have history of dementia.  Patient also complains of burning with urination for the past month.  Patient denies abdominal pain, nausea, vomiting, shortness of breath, chest pain.  Other past medical history significant for hypertension, TIA, cancer, atrial fibrillation on Eliquis  HPI     Home Medications Prior to Admission medications   Medication Sig Start Date End Date Taking? Authorizing Provider  cephALEXin (KEFLEX) 500 MG capsule Take 1 capsule (500 mg total) by mouth 4 (four) times daily. 11/13/22  Yes Darrick Grinder, PA-C  predniSONE (STERAPRED UNI-PAK 21 TAB) 10 MG (21) TBPK tablet Take by mouth daily. Take 6 tabs by mouth daily  for 2 days, then 5 tabs for 2 days, then 4 tabs for 2 days, then 3 tabs for 2 days, 2 tabs for 2 days, then 1 tab by mouth daily for 2 days 11/13/22  Yes Darrick Grinder, PA-C  acetaminophen (TYLENOL) 325 MG tablet Take 2 tablets (650 mg total) by mouth every 6 (six) hours as needed for mild pain, fever or headache. 10/07/19   Shon Hale, MD  apixaban (ELIQUIS) 2.5 MG TABS tablet Take 1 tablet (2.5 mg total) by mouth 2 (two) times daily. 05/31/22   Hilty, Lisette Abu, MD  donepezil (ARICEPT) 10 MG tablet Take 10 mg by mouth every other day. 10/19/21   [provider]  escitalopram (LEXAPRO) 10 MG tablet TAKE 1 TABLET BY MOUTH EVERY DAY 10/31/22   Mechele Claude, MD  estradiol (ESTRACE VAGINAL) 0.1 MG/GM vaginal cream Place 1 Applicatorful vaginally at  bedtime. 02/20/22   Mechele Claude, MD  famotidine (PEPCID) 20 MG tablet Take 1 tablet (20 mg total) by mouth every 12 (twelve) hours. 09/27/22   Mechele Claude, MD  levothyroxine (SYNTHROID) 50 MCG tablet TAKE 1 TABLET BY MOUTH EVERY DAY 07/13/22   Mechele Claude, MD  metoprolol succinate (TOPROL-XL) 100 MG 24 hr tablet TAKE 1 TABLET DAILY WITH OR IMMEDIATELY FOLLOWING A MEAL. 10/31/22   Mechele Claude, MD  OVER THE COUNTER MEDICATION Vitamin b 12    [provider]  QUEtiapine (SEROQUEL) 50 MG tablet TAKE 1 TABLET BY MOUTH EVERYDAY AT BEDTIME 11/05/22   Mechele Claude, MD  solifenacin (VESICARE) 5 MG tablet Take 1 tablet (5 mg total) by mouth daily. 07/17/22 07/17/23  Mechele Claude, MD  triamterene-hydrochlorothiazide (MAXZIDE-25) 37.5-25 MG tablet Take 1 tablet by mouth daily. For blood pressure and fluid 03/08/22   Mechele Claude, MD  vitamin B-12 (CYANOCOBALAMIN) 1000 MCG tablet Take 1,000 mcg by mouth daily.    [provider]      Allergies    Amlodipine besylate, Tuberculin tests, Nifedipine, Claritin [loratadine], Paroxetine hcl, and Vit d-vit e-safflower oil    Review of Systems   Review of Systems  Physical Exam Updated Vital Signs BP (!) 189/87 (BP Location: Left Arm)   Pulse 60   Temp 98 F (36.7 C) (Oral)   Resp 18   Ht 5\' 1"  (1.549  m)   Wt 52.2 kg   SpO2 96%   BMI 21.73 kg/m  Physical Exam Vitals and nursing note reviewed.  Constitutional:      General: She is not in acute distress.    Appearance: She is well-developed.  HENT:     Head: Normocephalic and atraumatic.  Eyes:     Conjunctiva/sclera: Conjunctivae normal.  Cardiovascular:     Rate and Rhythm: Normal rate.  Pulmonary:     Effort: Pulmonary effort is normal. No respiratory distress.     Breath sounds: Normal breath sounds.  Abdominal:     Palpations: Abdomen is soft.     Tenderness: There is no abdominal tenderness.  Musculoskeletal:        General: No swelling, tenderness, deformity or  signs of injury. Normal range of motion.     Cervical back: Neck supple.  Skin:    General: Skin is warm and dry.     Capillary Refill: Capillary refill takes less than 2 seconds.  Neurological:     General: No focal deficit present.     Mental Status: She is alert.     Sensory: No sensory deficit.     Motor: No weakness.  Psychiatric:        Mood and Affect: Mood normal.     ED Results / Procedures / Treatments   Labs (all labs ordered are listed, but only abnormal results are displayed) Labs Reviewed  URINALYSIS, ROUTINE W REFLEX MICROSCOPIC - Abnormal; Notable for the following components:      Result Value   Protein, ur 30 (*)    Leukocytes,Ua LARGE (*)    Bacteria, UA RARE (*)    All other components within normal limits    EKG None  Radiology DG Lumbar Spine Complete  Result Date: 11/13/2022 CLINICAL DATA:  Low back pain for 2 days EXAM: LUMBAR SPINE - COMPLETE 4+ VIEW COMPARISON:  None Available. FINDINGS: Five lumbar type vertebral bodies are well visualized. Vertebral body height is well maintained with the exception of the L4 vertebral body which shows mild height loss which appears chronic in nature. Generalized osteopenia is noted. No soft tissue abnormality is seen. Mild osteophytic changes are noted. IMPRESSION: Mild height loss at L4 which is new from a prior exam from 03/14/2021 but appears chronic in nature. Mild degenerative changes. No acute abnormality is seen. Electronically Signed   By: Alcide Clever M.D.   On: 11/13/2022 19:15    Procedures Procedures    Medications Ordered in ED Medications  cephALEXin (KEFLEX) capsule 500 mg (has no administration in time range)    ED Course/ Medical Decision Making/ A&P                             Medical Decision Making Amount and/or Complexity of Data Reviewed Labs: ordered. Radiology: ordered.   Patient presents to the emergency department with a chief complaint of low back pain with pain radiating  down the right leg.  Differential diagnosis includes but is not limited to fracture, dislocation, lumbar radiculopathy, cauda equina, and others  The patient has comorbidities including chronic Eliquis usage  I ordered and reviewed labs.  Pertinent results include UA with large leukocytes, rare bacteria  I ordered and interpreted imaging including plain films of the lumbar spine. Mild height loss at L4 which is new from a prior exam from  03/14/2021 but appears chronic in nature.    Mild degenerative changes.  No acute abnormality is seen.  I agree with the radiologist's findings  I ordered the patient 1 dose of Keflex to start antibiotic coverage for acute cystitis.   No acute findings on patient's imaging.  It does appear the patient has some height loss at L4.  Physical exam with no unilateral weakness, no sensory deficit in the lower extremities.  Patient with no red flag symptoms such as urinary incontinence, urinary retention, fecal incontinence, saddle anesthesia.  Symptoms seem most consistent with lumbar radiculopathy.  UA questionable for possible UTI.  Combined with patient systems plan to treat for urinary tract infection as well.  Patient to be discharged home on Keflex and Medrol Dosepak.  Patient will follow-up with her primary care provider.       Final Clinical Impression(s) / ED Diagnoses Final diagnoses:  Acute bilateral low back pain with right-sided sciatica  Acute cystitis without hematuria    Rx / DC Orders ED Discharge Orders          Ordered    cephALEXin (KEFLEX) 500 MG capsule  4 times daily        11/13/22 2001    predniSONE (STERAPRED UNI-PAK 21 TAB) 10 MG (21) TBPK tablet  Daily        11/13/22 2001              Pamala Duffel 11/13/22 2001    Eber Hong, MD 11/15/22 [phone]

## 2022-11-15 ENCOUNTER — Encounter: Payer: Self-pay | Admitting: Family Medicine

## 2022-11-15 ENCOUNTER — Ambulatory Visit (INDEPENDENT_AMBULATORY_CARE_PROVIDER_SITE_OTHER): Payer: Medicare Other | Admitting: Family Medicine

## 2022-11-15 VITALS — BP 203/97 | HR 63 | Temp 98.3°F | Ht 61.0 in

## 2022-11-15 DIAGNOSIS — N3000 Acute cystitis without hematuria: Secondary | ICD-10-CM

## 2022-11-15 DIAGNOSIS — M5441 Lumbago with sciatica, right side: Secondary | ICD-10-CM | POA: Diagnosis not present

## 2022-11-15 DIAGNOSIS — I10 Essential (primary) hypertension: Secondary | ICD-10-CM

## 2022-11-15 DIAGNOSIS — I4891 Unspecified atrial fibrillation: Secondary | ICD-10-CM | POA: Diagnosis not present

## 2022-11-15 NOTE — Progress Notes (Signed)
Established Patient Office Visit  Subjective   Patient ID: Crystal Browning, female    DOB: 1933-10-13  Age: 89 y.o. MRN: [id number]  Chief Complaint  Patient presents with   Back Pain    HPI Kalisha is here with daughter today. She was seen in the ER at [facility] on 4/30 for back pain. There  was no prior trauma. The pain was in her lower back and radiated down the right leg, worse with movement or ambulation. She also reported dysuria x 1 month. Xray of lumbar what mild height loss at L4, with was new from prior xray along with mild degenerative changes. She had no red flags. UA was questionable for possible. She was discharged with keflex and prednisone taper over 12 days.   She reports that her back pack is now much better with less radiation. Denies numbness, tingling, focal weakness. Denies urinary symptoms now. Denies changes in bowel or bladder control.   They report that home BPs have been stable for her with reading of 140-150s/70s. Denies chest pain, dyspnea, focal weakness, edema, visual disturbances. Denies palpations.     ROS As per HPI.    Objective:     BP (!) 203/97   Pulse 63   Temp 98.3 F (36.8 C) (Temporal)   Ht 5\' 1"  (1.549 m)   SpO2 94%   BMI 21.73 kg/m  BP Readings from Last 3 Encounters:  11/15/22 (!) 203/97  11/13/22 (!) 189/87  09/26/22 (!) 199/86      Physical Exam Vitals and nursing note reviewed.  Constitutional:      General: She is not in acute distress.    Appearance: Normal appearance. She is not ill-appearing, toxic-appearing or diaphoretic.  Cardiovascular:     Rate and Rhythm: Normal rate and regular rhythm.     Heart sounds: Normal heart sounds. No murmur heard. Pulmonary:     Effort: Pulmonary effort is normal. No respiratory distress.     Breath sounds: Normal breath sounds.  Abdominal:     General: Bowel sounds are normal. There is no distension.     Palpations: Abdomen is soft.     Tenderness: There is no abdominal tenderness.  There is no right CVA tenderness, left CVA tenderness, guarding or rebound.  Musculoskeletal:     Lumbar back: No swelling, edema or bony tenderness.  Neurological:     Mental Status: She is alert and oriented to person, place, and time. Mental status is at baseline.     Gait: Gait abnormal (arrives in wheelchair).  Psychiatric:        Mood and Affect: Mood normal.        Behavior: Behavior normal.      No results found for any visits on 11/15/22.    The ASCVD Risk score (Arnett DK, et al., 2019) failed to calculate for the following reasons:   The 2019 ASCVD risk score is only valid for ages 30 to 83    Assessment & Plan:   Mallery was seen today for back pain.  Diagnoses and all orders for this visit:  Acute bilateral low back pain with right-sided sciatica  Acute cystitis without hematuria  Primary hypertension  Atrial fibrillation, unspecified type [facility])  Reviewed ER note, imaging, and labs. Back pain has improved with prednisone. No red flags. BP is uncontrolled today in office, but reprots well controlled at home. Discussed side effects of prednisone and risks with concurrent HTN and A. Fib. Discussed can taper quicker with decrease  in dose each day, rather than every other day. Discussed to monitor BP at home and notify for elevated reading. HR is regular today with normal rate. She denies palpitations. Complete keflex as prescribed. Strict return precautions given.    Return if symptoms worsen or fail to improve.  The patient indicates understanding of these issues and agrees with the plan.    Gabriel Earing, FNP

## 2022-12-20 ENCOUNTER — Telehealth: Payer: Self-pay | Admitting: Family Medicine

## 2022-12-20 NOTE — Telephone Encounter (Signed)
Pt can't take the SOLIFENACIN Rx because it makes her feet and legs swell. Wants to know if something else can be sent in for her?  Also wants to know if the Seroquel can be changed to taking 25 mg everyday instead of taking 50mg  everyday because it helps even thought it helps her rest good at night time, it makes her feel sluggish during the day.  Please advise and call back with update.

## 2022-12-20 NOTE — Telephone Encounter (Signed)
DC solifenacin. Can discuss replacement at next follow up. Okay to decrease the seroquel.

## 2022-12-20 NOTE — Telephone Encounter (Signed)
DAUGHTER JULIE AWARE

## 2023-01-09 ENCOUNTER — Encounter: Payer: Self-pay | Admitting: Family Medicine

## 2023-01-09 ENCOUNTER — Ambulatory Visit (INDEPENDENT_AMBULATORY_CARE_PROVIDER_SITE_OTHER): Payer: Medicare Other | Admitting: Family Medicine

## 2023-01-09 VITALS — BP 157/67 | HR 73 | Temp 98.0°F | Resp 20 | Ht 61.0 in | Wt 115.0 lb

## 2023-01-09 DIAGNOSIS — R238 Other skin changes: Secondary | ICD-10-CM

## 2023-01-09 DIAGNOSIS — R300 Dysuria: Secondary | ICD-10-CM

## 2023-01-09 LAB — URINALYSIS, ROUTINE W REFLEX MICROSCOPIC
Bilirubin, UA: NEGATIVE
Glucose, UA: NEGATIVE
Ketones, UA: NEGATIVE
Nitrite, UA: NEGATIVE
Protein,UA: NEGATIVE
Specific Gravity, UA: 1.015 (ref 1.005–1.030)
Urobilinogen, Ur: 0.2 mg/dL (ref 0.2–1.0)
pH, UA: 5.5 (ref 5.0–7.5)

## 2023-01-09 LAB — MICROSCOPIC EXAMINATION
RBC, Urine: NONE SEEN /hpf (ref 0–2)
Renal Epithel, UA: NONE SEEN /hpf

## 2023-01-09 MED ORDER — NYSTATIN 100000 UNIT/GM EX CREA
1.0000 | TOPICAL_CREAM | Freq: Two times a day (BID) | CUTANEOUS | 0 refills | Status: AC
Start: 2023-01-09 — End: ?

## 2023-01-09 MED ORDER — ZINC OXIDE 10 % EX OINT
TOPICAL_OINTMENT | CUTANEOUS | 1 refills | Status: AC
Start: 2023-01-09 — End: ?

## 2023-01-09 MED ORDER — CEPHALEXIN 500 MG PO CAPS
500.0000 mg | ORAL_CAPSULE | Freq: Two times a day (BID) | ORAL | 0 refills | Status: DC
Start: 2023-01-09 — End: 2023-03-05

## 2023-01-09 NOTE — Progress Notes (Signed)
Acute Office Visit  Subjective:     Patient ID: Crystal Browning, female    DOB: 1934-01-01, 89 y.o.   MRN: [id number]  Chief Complaint  Patient presents with   vaginal irritation   Dysuria   Here with daughter today.   Dysuria  This is a new problem. The current episode started 1 to 4 weeks ago. The problem occurs every urination. The problem has been gradually worsening. The quality of the pain is described as burning. There has been no fever. Associated symptoms include frequency. Pertinent negatives include no chills, discharge, flank pain, hematuria, hesitancy, nausea, possible pregnancy, sweats, urgency or vomiting. Associated symptoms comments: erythema to perineum with itching. She has tried increased fluids for the symptoms. Her past medical history is significant for recurrent UTIs.   She is incontinent at night and wears depends at night.  She has had leakage during the day for the last few weeks.   Review of Systems  Constitutional:  Negative for chills.  Gastrointestinal:  Negative for nausea and vomiting.  Genitourinary:  Positive for dysuria and frequency. Negative for flank pain, hematuria, hesitancy and urgency.        Objective:    BP (!) 157/67   Pulse 73   Temp 98 F (36.7 C) (Temporal)   Resp 20   Ht 5\' 1"  (1.549 m)   Wt 115 lb (52.2 kg)   SpO2 97%   BMI 21.73 kg/m    Physical Exam Vitals and nursing note reviewed.  Constitutional:      General: She is not in acute distress.    Appearance: She is not ill-appearing, toxic-appearing or diaphoretic.  Cardiovascular:     Rate and Rhythm: Normal rate and regular rhythm.     Heart sounds: Normal heart sounds. No murmur heard. Pulmonary:     Effort: Pulmonary effort is normal. No respiratory distress.     Breath sounds: Normal breath sounds.  Abdominal:     General: Bowel sounds are normal. There is no distension.     Palpations: Abdomen is soft.     Tenderness: There is no abdominal tenderness.  There is no right CVA tenderness, left CVA tenderness, guarding or rebound.  Musculoskeletal:     Cervical back: Neck supple. No rigidity.     Right lower leg: No edema.     Left lower leg: No edema.  Skin:    General: Skin is warm and dry.  Neurological:     Mental Status: She is alert and oriented to person, place, and time. Mental status is at baseline.     Gait: Gait abnormal (arrives in wheelchair).  Psychiatric:        Mood and Affect: Mood normal.     Urine dipstick shows positive for RBC's and positive for leukocytes.  Micro exam: 0-5 WBC's per HPF, 0 RBC's per HPF, and few+ bacteria.       Assessment & Plan:   Crystal Browning was seen today for vaginal irritation and dysuria.  Diagnoses and all orders for this visit:  Dysuria Leuks and some bacteria on UA. Will treat with keflex pending culture.  -     Cancel: Urinalysis -     Urinalysis, Routine w reflex microscopic -     Urine Culture -     cephALEXin (KEFLEX) 500 MG capsule; Take 1 capsule (500 mg total) by mouth 2 (two) times daily.  Skin irritation Barrier cream as below. Nystatin BID x1 week. Discussed prevention.  -  Zinc Oxide 10 % OINT; Apply to skin as needed. -     nystatin cream (MYCOSTATIN); Apply 1 Application topically 2 (two) times daily.   Return if symptoms worsen or fail to improve.  The patient indicates understanding of these issues and agrees with the plan.  Gabriel Earing, FNP

## 2023-01-13 LAB — URINE CULTURE

## 2023-01-15 LAB — URINE CULTURE

## 2023-02-03 ENCOUNTER — Other Ambulatory Visit: Payer: Self-pay

## 2023-02-03 ENCOUNTER — Emergency Department (HOSPITAL_COMMUNITY)
Admission: EM | Admit: 2023-02-03 | Discharge: 2023-02-03 | Disposition: A | Discharge status: Home / Self Care | Payer: Medicare Other | Attending: Emergency Medicine | Admitting: Emergency Medicine

## 2023-02-03 ENCOUNTER — Encounter (HOSPITAL_COMMUNITY): Payer: Self-pay

## 2023-02-03 ENCOUNTER — Emergency Department (HOSPITAL_COMMUNITY): Payer: Medicare Other

## 2023-02-03 DIAGNOSIS — I517 Cardiomegaly: Secondary | ICD-10-CM | POA: Diagnosis not present

## 2023-02-03 DIAGNOSIS — I4891 Unspecified atrial fibrillation: Secondary | ICD-10-CM | POA: Insufficient documentation

## 2023-02-03 DIAGNOSIS — I10 Essential (primary) hypertension: Secondary | ICD-10-CM | POA: Insufficient documentation

## 2023-02-03 DIAGNOSIS — I4510 Unspecified right bundle-branch block: Secondary | ICD-10-CM | POA: Insufficient documentation

## 2023-02-03 DIAGNOSIS — R55 Syncope and collapse: Secondary | ICD-10-CM | POA: Diagnosis not present

## 2023-02-03 DIAGNOSIS — Z7901 Long term (current) use of anticoagulants: Secondary | ICD-10-CM | POA: Insufficient documentation

## 2023-02-03 DIAGNOSIS — I959 Hypotension, unspecified: Secondary | ICD-10-CM | POA: Diagnosis not present

## 2023-02-03 DIAGNOSIS — I700 Atherosclerosis of aorta: Secondary | ICD-10-CM | POA: Diagnosis not present

## 2023-02-03 DIAGNOSIS — J984 Other disorders of lung: Secondary | ICD-10-CM | POA: Diagnosis not present

## 2023-02-03 DIAGNOSIS — R531 Weakness: Secondary | ICD-10-CM | POA: Diagnosis not present

## 2023-02-03 DIAGNOSIS — J189 Pneumonia, unspecified organism: Secondary | ICD-10-CM

## 2023-02-03 DIAGNOSIS — R300 Dysuria: Secondary | ICD-10-CM | POA: Insufficient documentation

## 2023-02-03 DIAGNOSIS — Z79899 Other long term (current) drug therapy: Secondary | ICD-10-CM | POA: Diagnosis not present

## 2023-02-03 DIAGNOSIS — R918 Other nonspecific abnormal finding of lung field: Secondary | ICD-10-CM | POA: Diagnosis not present

## 2023-02-03 LAB — CBC WITH DIFFERENTIAL/PLATELET
Abs Immature Granulocytes: 0.01 10*3/uL (ref 0.00–0.07)
Basophils Absolute: 0 10*3/uL (ref 0.0–0.1)
Basophils Relative: 0 %
Eosinophils Absolute: 0.2 10*3/uL (ref 0.0–0.5)
Eosinophils Relative: 4 %
HCT: 38.4 % (ref 36.0–46.0)
Hemoglobin: 12.4 g/dL (ref 12.0–15.0)
Immature Granulocytes: 0 %
Lymphocytes Relative: 23 %
Lymphs Abs: 1.2 10*3/uL (ref 0.7–4.0)
MCH: 31.8 pg (ref 26.0–34.0)
MCHC: 32.3 g/dL (ref 30.0–36.0)
MCV: 98.5 fL (ref 80.0–100.0)
Monocytes Absolute: 0.6 10*3/uL (ref 0.1–1.0)
Monocytes Relative: 11 %
Neutro Abs: 3.4 10*3/uL (ref 1.7–7.7)
Neutrophils Relative %: 62 %
Platelets: 238 10*3/uL (ref 150–400)
RBC: 3.9 MIL/uL (ref 3.87–5.11)
RDW: 13.9 % (ref 11.5–15.5)
WBC: 5.4 10*3/uL (ref 4.0–10.5)
nRBC: 0 % (ref 0.0–0.2)

## 2023-02-03 LAB — COMPREHENSIVE METABOLIC PANEL
ALT: 16 U/L (ref 0–44)
AST: 34 U/L (ref 15–41)
Albumin: 3.3 g/dL — ABNORMAL LOW (ref 3.5–5.0)
Alkaline Phosphatase: 145 U/L — ABNORMAL HIGH (ref 38–126)
Anion gap: 6 (ref 5–15)
BUN: 12 mg/dL (ref 8–23)
CO2: 27 mmol/L (ref 22–32)
Calcium: 8.6 mg/dL — ABNORMAL LOW (ref 8.9–10.3)
Chloride: 103 mmol/L (ref 98–111)
Creatinine, Ser: 0.92 mg/dL (ref 0.44–1.00)
GFR, Estimated: >60 mL/min (ref >60)
Glucose, Bld: 118 mg/dL — ABNORMAL HIGH (ref 70–99)
Potassium: 4.3 mmol/L (ref 3.5–5.1)
Sodium: 136 mmol/L (ref 135–145)
Total Bilirubin: 0.7 mg/dL (ref 0.3–1.2)
Total Protein: 6.5 g/dL (ref 6.5–8.1)

## 2023-02-03 LAB — I-STAT CHEM 8, ED
BUN: 12 mg/dL (ref 8–23)
Calcium, Ion: 1.01 mmol/L — ABNORMAL LOW (ref 1.15–1.40)
Chloride: 101 mmol/L (ref 98–111)
Creatinine, Ser: 1 mg/dL (ref 0.44–1.00)
Glucose, Bld: 117 mg/dL — ABNORMAL HIGH (ref 70–99)
HCT: 39 % (ref 36.0–46.0)
Hemoglobin: 13.3 g/dL (ref 12.0–15.0)
Potassium: 4.4 mmol/L (ref 3.5–5.1)
Sodium: 138 mmol/L (ref 135–145)
TCO2: 28 mmol/L (ref 22–32)

## 2023-02-03 LAB — URINALYSIS, W/ REFLEX TO CULTURE (INFECTION SUSPECTED)
Bacteria, UA: NONE SEEN
Bilirubin Urine: NEGATIVE
Glucose, UA: NEGATIVE mg/dL
Hgb urine dipstick: NEGATIVE
Ketones, ur: NEGATIVE mg/dL
Nitrite: NEGATIVE
Protein, ur: NEGATIVE mg/dL
Specific Gravity, Urine: 1.008 (ref 1.005–1.030)
pH: 6 (ref 5.0–8.0)

## 2023-02-03 LAB — TROPONIN I (HIGH SENSITIVITY)
Troponin I (High Sensitivity): 11 ng/L (ref <18)
Troponin I (High Sensitivity): 10 ng/L (ref <18)

## 2023-02-03 LAB — APTT: aPTT: 24 seconds (ref 24–36)

## 2023-02-03 LAB — MAGNESIUM: Magnesium: 2.1 mg/dL (ref 1.7–2.4)

## 2023-02-03 LAB — LACTIC ACID, PLASMA
Lactic Acid, Venous: 1.7 mmol/L (ref 0.5–1.9)
Lactic Acid, Venous: 1.6 mmol/L (ref 0.5–1.9)

## 2023-02-03 LAB — PROTIME-INR
INR: 1.3 — ABNORMAL HIGH (ref 0.8–1.2)
Prothrombin Time: 16.4 seconds — ABNORMAL HIGH (ref 11.4–15.2)

## 2023-02-03 LAB — TSH: TSH: 1.913 u[IU]/mL (ref 0.350–4.500)

## 2023-02-03 LAB — CBG MONITORING, ED: Glucose-Capillary: 106 mg/dL — ABNORMAL HIGH (ref 70–99)

## 2023-02-03 LAB — BRAIN NATRIURETIC PEPTIDE: B Natriuretic Peptide: 163 pg/mL — ABNORMAL HIGH (ref 0.0–100.0)

## 2023-02-03 MED ORDER — LACTATED RINGERS IV BOLUS (SEPSIS)
500.0000 mL | Freq: Once | INTRAVENOUS | Status: AC
  Administered 2023-02-03: 500 mL via INTRAVENOUS

## 2023-02-03 MED ORDER — DOXYCYCLINE HYCLATE 100 MG PO CAPS: 100.0000 mg | ORAL_CAPSULE | Freq: Two times a day (BID) | ORAL | 0 refills | Status: DC

## 2023-02-03 MED ORDER — FLUCONAZOLE 150 MG PO TABS: 150.0000 mg | ORAL_TABLET | Freq: Once | ORAL | 0 refills | Status: AC

## 2023-02-03 NOTE — ED Notes (Signed)
Pt is complaining of vaginal itching. No redness or discharge noted. Pt also complains of excoriated area to back that's covered with bandaid from home. Doctor informed.

## 2023-02-03 NOTE — ED Provider Notes (Signed)
  Physical Exam  BP (!) 177/75   Pulse 76   Temp 98 F (36.7 C) (Oral)   Resp 15   Ht 5\' 1"  (1.549 m)   Wt 52.2 kg   SpO2 99%   BMI 21.73 kg/m   Physical Exam  Procedures  Procedures  ED Course / MDM    Medical Decision Making Amount and/or Complexity of Data Reviewed Labs: ordered. Radiology: ordered.  Risk Prescription drug management.   Received patient in signout.  Weakness for 2 weeks.  Has had a cough with some sputum production.  Also itching on the back.  Initial hypotension with A-fib but has resolved.  Blood work overall reassuring.  X-ray shows potential pneumonia.  Not hypoxic.  Initial plan for discharge but family had some worry.  Will ambulate patient to see how she does.  Ambulate around department.  Will discharge with short-term follow-up.       Benjiman Core, MD 02/03/23 1800

## 2023-02-03 NOTE — ED Provider Notes (Signed)
[place] EMERGENCY DEPARTMENT AT [facility] Provider Note   CSN: [id number] Arrival date & time: 02/03/23  1322     History  Chief Complaint  Patient presents with   Weakness    Crystal Browning is a 89 y.o. female.   Weakness Associated symptoms: chest pain and dysuria   Patient presents for generalized weakness and near syncope.  Medical history includes HTN, atrial fibrillation.  She is on Eliquis.  Other medications include metoprolol, Synthroid.  She denies any missed doses of Eliquis.  She did take her morning medications.  Yesterday, she was in her normal state of health.  Patient lives independently and ambulates with a walker at baseline.  Her daughter is checking on her frequently.  This morning, she felt generalized weakness with near syncopal symptoms, worsened with standing.  She also describes a left lateral chest discomfort.  She has had recent dysuria.  She denies any other recent symptoms.     Home Medications Prior to Admission medications   Medication Sig Start Date End Date Taking? Authorizing Provider  acetaminophen (TYLENOL) 325 MG tablet Take 2 tablets (650 mg total) by mouth every 6 (six) hours as needed for mild pain, fever or headache. 10/07/19   Shon Hale, MD  apixaban (ELIQUIS) 2.5 MG TABS tablet Take 1 tablet (2.5 mg total) by mouth 2 (two) times daily. 05/31/22   Hilty, Lisette Abu, MD  cephALEXin (KEFLEX) 500 MG capsule Take 1 capsule (500 mg total) by mouth 2 (two) times daily. 01/09/23   Gabriel Earing, FNP  donepezil (ARICEPT) 10 MG tablet Take 10 mg by mouth every other day. 10/19/21   [provider]  escitalopram (LEXAPRO) 10 MG tablet TAKE 1 TABLET BY MOUTH EVERY DAY 10/31/22   Mechele Claude, MD  estradiol (ESTRACE VAGINAL) 0.1 MG/GM vaginal cream Place 1 Applicatorful vaginally at bedtime. 02/20/22   Mechele Claude, MD  famotidine (PEPCID) 20 MG tablet Take 1 tablet (20 mg total) by mouth every 12 (twelve) hours. 09/27/22    Mechele Claude, MD  levothyroxine (SYNTHROID) 50 MCG tablet TAKE 1 TABLET BY MOUTH EVERY DAY 07/13/22   Mechele Claude, MD  metoprolol succinate (TOPROL-XL) 100 MG 24 hr tablet TAKE 1 TABLET DAILY WITH OR IMMEDIATELY FOLLOWING A MEAL. 10/31/22   Mechele Claude, MD  nystatin cream (MYCOSTATIN) Apply 1 Application topically 2 (two) times daily. 01/09/23   Gabriel Earing, FNP  OVER THE COUNTER MEDICATION Vitamin b 12    [provider]  QUEtiapine (SEROQUEL) 50 MG tablet TAKE 1 TABLET BY MOUTH EVERYDAY AT BEDTIME 11/05/22   Mechele Claude, MD  solifenacin (VESICARE) 5 MG tablet Take 1 tablet (5 mg total) by mouth daily. 07/17/22 07/17/23  Mechele Claude, MD  triamterene-hydrochlorothiazide (MAXZIDE-25) 37.5-25 MG tablet Take 1 tablet by mouth daily. For blood pressure and fluid 03/08/22   Mechele Claude, MD  vitamin B-12 (CYANOCOBALAMIN) 1000 MCG tablet Take 1,000 mcg by mouth daily.    [provider]  Zinc Oxide 10 % OINT Apply to skin as needed. 01/09/23   Gabriel Earing, FNP      Allergies    Amlodipine besylate, Tuberculin tests, Nifedipine, Claritin [loratadine], Paroxetine hcl, and Vit d-vit e-safflower oil    Review of Systems   Review of Systems  Cardiovascular:  Positive for chest pain.  Genitourinary:  Positive for dysuria.  Neurological:  Positive for weakness (Generalized) and light-headedness.  All other systems reviewed and are negative.   Physical Exam Updated Vital  Signs BP (!) 135/50   Pulse 66   Temp 98.3 F (36.8 C)   Resp 19   Ht 5\' 1"  (1.549 m)   Wt 52.2 kg   SpO2 97%   BMI 21.73 kg/m  Physical Exam Vitals and nursing note reviewed.  Constitutional:      General: She is not in acute distress.    Appearance: Normal appearance. She is well-developed. She is not ill-appearing, toxic-appearing or diaphoretic.  HENT:     Head: Normocephalic and atraumatic.     Right Ear: External ear normal.     Left Ear: External ear normal.     Nose: Nose  normal.     Mouth/Throat:     Mouth: Mucous membranes are moist.  Eyes:     Extraocular Movements: Extraocular movements intact.     Conjunctiva/sclera: Conjunctivae normal.  Cardiovascular:     Rate and Rhythm: Normal rate. Rhythm irregular.     Heart sounds: No murmur heard. Pulmonary:     Effort: Pulmonary effort is normal. No respiratory distress.     Breath sounds: Normal breath sounds. No wheezing or rales.  Chest:     Chest wall: No tenderness.  Abdominal:     General: There is no distension.     Palpations: Abdomen is soft.     Tenderness: There is no abdominal tenderness.  Musculoskeletal:        General: No swelling. Normal range of motion.     Cervical back: Normal range of motion and neck supple.     Right lower leg: No edema.     Left lower leg: No edema.  Skin:    General: Skin is warm and dry.     Coloration: Skin is not jaundiced or pale.  Neurological:     General: No focal deficit present.     Mental Status: She is alert and oriented to person, place, and time.     Cranial Nerves: No cranial nerve deficit.     Sensory: No sensory deficit.     Motor: No weakness.     Coordination: Coordination normal.  Psychiatric:        Mood and Affect: Mood normal.        Behavior: Behavior normal.     ED Results / Procedures / Treatments   Labs (all labs ordered are listed, but only abnormal results are displayed) Labs Reviewed  COMPREHENSIVE METABOLIC PANEL - Abnormal; Notable for the following components:      Result Value   Glucose, Bld 118 (*)    Calcium 8.6 (*)    Albumin 3.3 (*)    Alkaline Phosphatase 145 (*)    All other components within normal limits  PROTIME-INR - Abnormal; Notable for the following components:   Prothrombin Time 16.4 (*)    INR 1.3 (*)    All other components within normal limits  CBG MONITORING, ED - Abnormal; Notable for the following components:   Glucose-Capillary 106 (*)    All other components within normal limits  I-STAT  CHEM 8, ED - Abnormal; Notable for the following components:   Glucose, Bld 117 (*)    Calcium, Ion 1.01 (*)    All other components within normal limits  CULTURE, BLOOD (ROUTINE X 2)  CULTURE, BLOOD (ROUTINE X 2)  CBC WITH DIFFERENTIAL/PLATELET  LACTIC ACID, PLASMA  APTT  TSH  MAGNESIUM  LACTIC ACID, PLASMA  BRAIN NATRIURETIC PEPTIDE  URINALYSIS, W/ REFLEX TO CULTURE (INFECTION SUSPECTED)  TROPONIN I (HIGH SENSITIVITY)  TROPONIN I (HIGH SENSITIVITY)    EKG EKG Interpretation Date/Time:  Sunday February 03 2023 13:41:44 EDT Ventricular Rate:  54 PR Interval:    QRS Duration:  118 QT Interval:  460 QTC Calculation: 436 R Axis:   -55  Text Interpretation: Atrial fibrillation with slow ventricular response Left axis deviation Right bundle branch block Minimal voltage criteria for LVH, may be normal variant ( R in aVL ) Abnormal ECG Confirmed by Gloris Manchester [id number]) on 02/03/2023 2:11:18 PM  Radiology DG Chest Port 1 View  Result Date: 02/03/2023 CLINICAL DATA:  Questionable sepsis.  Evaluate for abnormality. EXAM: PORTABLE CHEST 1 VIEW COMPARISON:  Radiographs 09/21/2021 and 08/28/2020. FINDINGS: 1459 hours. Stable cardiomegaly and aortic atherosclerosis. There is stable chronic scarring at both lung apices with associated mild volume loss. Questionable mildly increased volume loss of the left lung base which may relate to patient rotation. There is mild streaky opacity in the left lower lobe. The right lung is clear. No pleural effusion or pneumothorax. Grossly stable old fracture of the mid left clavicle with inferior displacement. No acute osseous findings are seen. Telemetry leads overlie the chest. IMPRESSION: 1. Mild streaky opacity in the left lower lobe may reflect atelectasis or early pneumonia. 2. Stable cardiomegaly and chronic biapical scarring. 3. Stable old fracture of the mid left clavicle. Electronically Signed   By: Carey Bullocks M.D.   On: 02/03/2023 15:49     Procedures Procedures    Medications Ordered in ED Medications  lactated ringers bolus 500 mL (0 mLs Intravenous Stopped 02/03/23 1552)    ED Course/ Medical Decision Making/ A&P                             Medical Decision Making Amount and/or Complexity of Data Reviewed Labs: ordered. Radiology: ordered.   This patient presents to the ED for concern of near syncope, this involves an extensive number of treatment options, and is a complaint that carries with it a high risk of complications and morbidity.  The differential diagnosis includes dehydration, infection, polypharmacy, metabolic derangements, arrhythmia   Co morbidities that complicate the patient evaluation  HTN, atrial fibrillation   Additional history obtained:  Additional history obtained from N/A External records from outside source obtained and reviewed including EMR   Lab Tests:  I Ordered, and personally interpreted labs.  The pertinent results include: Normal kidney function, normal electrolytes, normal hemoglobin, no leukocytosis, normal troponin   Imaging Studies ordered:  I ordered imaging studies including chest x-ray I independently visualized and interpreted imaging which showed pending at time of signout I agree with the radiologist interpretation   Cardiac Monitoring: / EKG:  The patient was maintained on a cardiac monitor.  I personally viewed and interpreted the cardiac monitored which showed an underlying rhythm of: Atrial fibrillation  Problem List / ED Course / Critical interventions / Medication management  Patient presents for generalized weakness and near syncope starting today.  Initial vital signs in triage are notable for bradycardia and hypotension.  On EKG, patient has atrial fibrillation with a slow ventricular response.  When patient was bedded in the ED, she had continued atrial fibrillation, now at a normal rate.  Blood pressure is now normotensive.  She is overall  well-appearing on exam.  She has no focal neurologic deficits.  Broad workup was initiated.  Initial serum lab work is unremarkable.  Secondary troponin and urinalysis pending at time of signout.  Care  of patient was signed out to oncoming ED provider. I ordered medication including IV fluids for hydration Reevaluation of the patient after these medicines showed that the patient improved I have reviewed the patients home medicines and have made adjustments as needed   Social Determinants of Health:  Lives independently        Final Clinical Impression(s) / ED Diagnoses Final diagnoses:  Generalized weakness  Near syncope    Rx / DC Orders ED Discharge Orders     None         Gloris Manchester, MD 02/03/23 1553

## 2023-02-03 NOTE — ED Notes (Signed)
Pt ambulated in room and around entire nurses station.  No c/o pain, SOB or discomfort.  VSS.  Hr 67 Afib, Sats 99% on RA, rest 18.  Dr Rubin Payor notified.

## 2023-02-03 NOTE — ED Triage Notes (Signed)
Brought by EMS General weakness. Burning on urination. CP  Pt is A&Ox4 in triage but very hard to get a story from her.   Pt stated feels like she is going to pass out because the pinched nerve and pain in back.  Pt stated that her chest hurts. Can't given description. "It just hurts" Denies SOB Complains of burning on urination

## 2023-02-04 LAB — CULTURE, BLOOD (ROUTINE X 2)
Culture: NO GROWTH
Special Requests: ADEQUATE

## 2023-02-05 ENCOUNTER — Telehealth: Payer: Self-pay

## 2023-02-05 LAB — CULTURE, BLOOD (ROUTINE X 2)
Culture: NO GROWTH
Special Requests: ADEQUATE

## 2023-02-05 NOTE — Telephone Encounter (Signed)
Transition Care Management Unsuccessful Follow-up Telephone Call  Date of discharge and from where:  02/03/2023 [facility]  Attempts:  1st Attempt  Reason for unsuccessful TCM follow-up call:  Unable to reach patient  Azlan Hanway [facility]  [facility] Population Health Community Resource Care Guide   [email or web address]  ?? [phone]   Website: [email or web address]  [email or web address]

## 2023-02-05 NOTE — Telephone Encounter (Signed)
Transition Care Management Unsuccessful Follow-up Telephone Call  Date of discharge and from where:  02/03/2023 [facility]  Attempts:  2nd Attempt  Reason for unsuccessful TCM follow-up call:  No answer/busy  Lamonica Trueba [facility]  [facility] Population Health Community Resource Care Guide   [email or web address]  ?? [phone]   Website: [email or web address]  [email or web address]

## 2023-02-07 LAB — CULTURE, BLOOD (ROUTINE X 2)

## 2023-02-17 ENCOUNTER — Emergency Department (HOSPITAL_COMMUNITY)
Admission: EM | Admit: 2023-02-17 | Discharge: 2023-02-17 | Disposition: A | Discharge status: Home / Self Care | Payer: Medicare Other | Attending: Emergency Medicine | Admitting: Emergency Medicine

## 2023-02-17 ENCOUNTER — Other Ambulatory Visit: Payer: Self-pay

## 2023-02-17 ENCOUNTER — Encounter (HOSPITAL_COMMUNITY): Payer: Self-pay

## 2023-02-17 ENCOUNTER — Emergency Department (HOSPITAL_COMMUNITY): Payer: Medicare Other

## 2023-02-17 DIAGNOSIS — F0390 Unspecified dementia without behavioral disturbance: Secondary | ICD-10-CM | POA: Diagnosis not present

## 2023-02-17 DIAGNOSIS — M545 Low back pain, unspecified: Secondary | ICD-10-CM | POA: Diagnosis not present

## 2023-02-17 DIAGNOSIS — I4891 Unspecified atrial fibrillation: Secondary | ICD-10-CM | POA: Insufficient documentation

## 2023-02-17 DIAGNOSIS — H6122 Impacted cerumen, left ear: Secondary | ICD-10-CM | POA: Diagnosis not present

## 2023-02-17 DIAGNOSIS — R1031 Right lower quadrant pain: Secondary | ICD-10-CM | POA: Diagnosis not present

## 2023-02-17 DIAGNOSIS — R531 Weakness: Secondary | ICD-10-CM | POA: Insufficient documentation

## 2023-02-17 DIAGNOSIS — K5730 Diverticulosis of large intestine without perforation or abscess without bleeding: Secondary | ICD-10-CM | POA: Diagnosis not present

## 2023-02-17 DIAGNOSIS — Z7901 Long term (current) use of anticoagulants: Secondary | ICD-10-CM | POA: Insufficient documentation

## 2023-02-17 DIAGNOSIS — Z8521 Personal history of malignant neoplasm of larynx: Secondary | ICD-10-CM | POA: Insufficient documentation

## 2023-02-17 DIAGNOSIS — I10 Essential (primary) hypertension: Secondary | ICD-10-CM | POA: Insufficient documentation

## 2023-02-17 DIAGNOSIS — I959 Hypotension, unspecified: Secondary | ICD-10-CM | POA: Diagnosis not present

## 2023-02-17 DIAGNOSIS — K7689 Other specified diseases of liver: Secondary | ICD-10-CM | POA: Diagnosis not present

## 2023-02-17 LAB — COMPREHENSIVE METABOLIC PANEL
ALT: 17 U/L (ref 0–44)
AST: 29 U/L (ref 15–41)
Albumin: 3.3 g/dL — ABNORMAL LOW (ref 3.5–5.0)
Alkaline Phosphatase: 152 U/L — ABNORMAL HIGH (ref 38–126)
Anion gap: 7 (ref 5–15)
BUN: 8 mg/dL (ref 8–23)
CO2: 27 mmol/L (ref 22–32)
Calcium: 8.6 mg/dL — ABNORMAL LOW (ref 8.9–10.3)
Chloride: 100 mmol/L (ref 98–111)
Creatinine, Ser: 0.77 mg/dL (ref 0.44–1.00)
GFR, Estimated: >60 mL/min (ref >60)
Glucose, Bld: 116 mg/dL — ABNORMAL HIGH (ref 70–99)
Potassium: 3.4 mmol/L — ABNORMAL LOW (ref 3.5–5.1)
Sodium: 134 mmol/L — ABNORMAL LOW (ref 135–145)
Total Bilirubin: 0.9 mg/dL (ref 0.3–1.2)
Total Protein: 6.5 g/dL (ref 6.5–8.1)

## 2023-02-17 LAB — CBC WITH DIFFERENTIAL/PLATELET
Abs Immature Granulocytes: 0.04 10*3/uL (ref 0.00–0.07)
Basophils Absolute: 0 10*3/uL (ref 0.0–0.1)
Basophils Relative: 0 %
Eosinophils Absolute: 0 10*3/uL (ref 0.0–0.5)
Eosinophils Relative: 0 %
HCT: 38.9 % (ref 36.0–46.0)
Hemoglobin: 12.6 g/dL (ref 12.0–15.0)
Immature Granulocytes: 1 %
Lymphocytes Relative: 11 %
Lymphs Abs: 0.9 10*3/uL (ref 0.7–4.0)
MCH: 31.8 pg (ref 26.0–34.0)
MCHC: 32.4 g/dL (ref 30.0–36.0)
MCV: 98.2 fL (ref 80.0–100.0)
Monocytes Absolute: 0.9 10*3/uL (ref 0.1–1.0)
Monocytes Relative: 10 %
Neutro Abs: 6.7 10*3/uL (ref 1.7–7.7)
Neutrophils Relative %: 78 %
Platelets: 177 10*3/uL (ref 150–400)
RBC: 3.96 MIL/uL (ref 3.87–5.11)
RDW: 13.6 % (ref 11.5–15.5)
WBC: 8.5 10*3/uL (ref 4.0–10.5)
nRBC: 0 % (ref 0.0–0.2)

## 2023-02-17 LAB — URINALYSIS, W/ REFLEX TO CULTURE (INFECTION SUSPECTED)
Bacteria, UA: NONE SEEN
Bilirubin Urine: NEGATIVE
Glucose, UA: NEGATIVE mg/dL
Hgb urine dipstick: NEGATIVE
Ketones, ur: NEGATIVE mg/dL
Leukocytes,Ua: NEGATIVE
Nitrite: NEGATIVE
Protein, ur: NEGATIVE mg/dL
Specific Gravity, Urine: 1.004 — ABNORMAL LOW (ref 1.005–1.030)
pH: 6 (ref 5.0–8.0)

## 2023-02-17 LAB — LIPASE, BLOOD: Lipase: 25 U/L (ref 11–51)

## 2023-02-17 MED ORDER — ONDANSETRON HCL 4 MG/2ML IJ SOLN
4.0000 mg | Freq: Once | INTRAMUSCULAR | Status: AC
  Administered 2023-02-17: 4 mg via INTRAVENOUS
  Filled 2023-02-17: qty 2

## 2023-02-17 MED ORDER — IOHEXOL 300 MG/ML  SOLN
100.0000 mL | Freq: Once | INTRAMUSCULAR | Status: AC | PRN
  Administered 2023-02-17: 100 mL via INTRAVENOUS

## 2023-02-17 MED ORDER — HYDROMORPHONE HCL 1 MG/ML IJ SOLN
0.5000 mg | Freq: Once | INTRAMUSCULAR | Status: AC
  Administered 2023-02-17: 0.5 mg via INTRAVENOUS
  Filled 2023-02-17: qty 0.5

## 2023-02-17 MED ORDER — SODIUM CHLORIDE 0.9 % IV BOLUS
1000.0000 mL | Freq: Once | INTRAVENOUS | Status: AC
  Administered 2023-02-17: 1000 mL via INTRAVENOUS

## 2023-02-17 NOTE — ED Provider Notes (Signed)
[place] EMERGENCY DEPARTMENT AT [facility] Provider Note  CSN: [id number] Arrival date & time: 02/17/23 5621  Chief Complaint(s) Back Pain  HPI Crystal Browning is a 89 y.o. female history of atrial fibrillation, dementia, hypertension presenting to the emergency department with flank and abdominal pain.  Patient reports right-sided flank pain and right abdominal pain.  Reports some burning with urination.  She reports that the symptoms have been going on for a month but worsened this morning and was also feeling lightheaded and dizzy.  No nausea or vomiting.  Subjective fevers and chills.  Denies shortness of breath.  No syncope.  No diarrhea.  She is concerned that this could be related to her chronic back pain.  She also reports some left ear fullness.   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   Radiation    S/P radiation thrapy -06/2005   TIA (transient ischemic attack)    History of TIA's with no residual effect   Patient Active Problem List   Diagnosis Date Noted   Hallucination 12/09/2020   Pharyngoesophageal dysphagia 01/14/2018   Long term  (current) use of anticoagulants 12/14/2016   Atrial fibrillation (HCC) 10/30/2016   Other fatigue 10/30/2016   Venous insufficiency of both lower extremities 10/30/2016   History of laryngeal cancer 08/20/2016   Xerostomia 08/20/2016   Postmenopausal 07/17/2015   Thyroid activity decreased 07/17/2015   Essential hypertension 07/12/2015   Angina pectoris (HCC) 07/12/2015   Vitamin D deficiency 07/12/2015   Home Medication(s) Prior to Admission medications   Medication Sig Start Date End Date Taking? Authorizing Provider  acetaminophen (TYLENOL) 325 MG tablet Take 2 tablets (650 mg total) by mouth every 6 (six) hours as needed for mild pain, fever or headache. 10/07/19   Shon Hale, MD  apixaban (ELIQUIS) 2.5 MG TABS tablet Take 1 tablet (2.5 mg total) by mouth 2 (two) times daily. 05/31/22   Hilty, Lisette Abu, MD  cephALEXin (KEFLEX) 500 MG capsule Take 1 capsule (500 mg total) by mouth 2 (two) times daily. 01/09/23   Gabriel Earing, FNP  donepezil (ARICEPT) 10 MG tablet Take 10 mg by mouth every other day. 10/19/21   [provider]  doxycycline (VIBRAMYCIN) 100 MG capsule Take 1 capsule (100 mg total) by mouth 2 (two) times daily. 02/03/23   Benjiman Core, MD  escitalopram (LEXAPRO) 10 MG tablet TAKE 1 TABLET BY MOUTH EVERY DAY 10/31/22   Mechele Claude, MD  estradiol (ESTRACE VAGINAL) 0.1 MG/GM vaginal cream Place 1 Applicatorful vaginally at bedtime. 02/20/22   Mechele Claude, MD  famotidine (PEPCID) 20 MG tablet Take  1 tablet (20 mg total) by mouth every 12 (twelve) hours. 09/27/22   Mechele Claude, MD  levothyroxine (SYNTHROID) 50 MCG tablet TAKE 1 TABLET BY MOUTH EVERY DAY 07/13/22   Mechele Claude, MD  metoprolol succinate (TOPROL-XL) 100 MG 24 hr tablet TAKE 1 TABLET DAILY WITH OR IMMEDIATELY FOLLOWING A MEAL. 10/31/22   Mechele Claude, MD  nystatin cream (MYCOSTATIN) Apply 1 Application topically 2 (two) times daily. 01/09/23   Gabriel Earing, FNP  OVER THE COUNTER  MEDICATION Vitamin b 12    [provider]  QUEtiapine (SEROQUEL) 50 MG tablet TAKE 1 TABLET BY MOUTH EVERYDAY AT BEDTIME 11/05/22   Mechele Claude, MD  solifenacin (VESICARE) 5 MG tablet Take 1 tablet (5 mg total) by mouth daily. 07/17/22 07/17/23  Mechele Claude, MD  triamterene-hydrochlorothiazide (MAXZIDE-25) 37.5-25 MG tablet Take 1 tablet by mouth daily. For blood pressure and fluid 03/08/22   Mechele Claude, MD  vitamin B-12 (CYANOCOBALAMIN) 1000 MCG tablet Take 1,000 mcg by mouth daily.    [provider]  Zinc Oxide 10 % OINT Apply to skin as needed. 01/09/23   Gabriel Earing, FNP                                                                                                                                    Past Surgical History Past Surgical History:  Procedure Laterality Date   CARDIOVERSION N/A 11/23/2016   Procedure: CARDIOVERSION;  Surgeon: Chrystie Nose, MD;  Location: [facility] ENDOSCOPY;  Service: Cardiovascular;  Laterality: N/A;   CATARACT EXTRACTION, BILATERAL     DIRECT LARYNGOSCOPY  03/27/2005   S/P direct laryngoscopy, esophagoscopy and biopsy with Dr. Pollyann Kennedy   TOTAL ABDOMINAL HYSTERECTOMY W/ BILATERAL SALPINGOOPHORECTOMY  1986   TUBAL LIGATION     Family History Family History  Problem Relation Age of Onset   Stroke Mother    Heart disease Mother 96   Cancer Father 86       Prostate   Alcohol abuse Brother    COPD Brother     Social History Social History   Tobacco Use   Smoking status: Never   Smokeless tobacco: Never  Vaping Use   Vaping status: Never Used  Substance Use Topics   Alcohol use: No   Drug use: No   Allergies Amlodipine besylate, Tuberculin tests, Nifedipine, Claritin [loratadine], Paroxetine hcl, and Vit d-vit e-safflower oil  Review of Systems Review of Systems  All other systems reviewed and are negative.   Physical Exam Vital Signs  I have reviewed the triage vital signs BP (!) 108/51 (BP Location: Left Arm)    Pulse 60   Temp 98 F (36.7 C) (Oral)   Resp 16   Ht 5\' 1"  (1.549 m)   Wt 52 kg   SpO2 97%   BMI 21.66 kg/m  Physical Exam Vitals and nursing note reviewed.  Constitutional:      General: She  is not in acute distress.    Appearance: She is well-developed.  HENT:     Head: Normocephalic and atraumatic.     Right Ear: Tympanic membrane normal.     Left Ear: There is impacted cerumen.     Mouth/Throat:     Mouth: Mucous membranes are moist.  Eyes:     Pupils: Pupils are equal, round, and reactive to light.  Cardiovascular:     Rate and Rhythm: Normal rate and regular rhythm.     Heart sounds: No murmur heard. Pulmonary:     Effort: Pulmonary effort is normal. No respiratory distress.     Breath sounds: Normal breath sounds.  Abdominal:     General: Abdomen is flat.     Palpations: Abdomen is soft.     Tenderness: There is abdominal tenderness (RLQ). There is right CVA tenderness.  Musculoskeletal:        General: No tenderness.     Right lower leg: No edema.     Left lower leg: No edema.  Skin:    General: Skin is warm and dry.  Neurological:     General: No focal deficit present.     Mental Status: She is alert. Mental status is at baseline.  Psychiatric:        Mood and Affect: Mood normal.        Behavior: Behavior normal.     ED Results and Treatments Labs (all labs ordered are listed, but only abnormal results are displayed) Labs Reviewed  COMPREHENSIVE METABOLIC PANEL  CBC WITH DIFFERENTIAL/PLATELET  LIPASE, BLOOD  URINALYSIS, W/ REFLEX TO CULTURE (INFECTION SUSPECTED)                                                                                                                          Radiology No results found.  Pertinent labs & imaging results that were available during my care of the patient were reviewed by me and considered in my medical decision making (see MDM for details).  Medications Ordered in ED Medications  sodium chloride 0.9 % bolus  1,000 mL (has no administration in time range)  ondansetron (ZOFRAN) injection 4 mg (has no administration in time range)  HYDROmorphone (DILAUDID) injection 0.5 mg (has no administration in time range)  Procedures Procedures  (including critical care time)  Medical Decision Making / ED Course   MDM:  89 year old female presenting to the emergency department with flank and abdominal pain.  Patient well-appearing, not tachycardic or febrile.  Physical exam with right CVA and right lower quadrant tenderness on exam..  Differential includes pyelonephritis, nephrolithiasis, appendicitis, obstruction, perforation, abscess, constipation, chronic pain.  Will obtain labs including urinalysis and obtain CT scan to further evaluate.  Less likely this is related to her chronic back pain given abdominal tenderness.  Patient also reports left ear fullness.  On exam she has cerumen impaction. Will prescribe debrox if discharged      Additional history obtained: -Additional history obtained from {wsadditionalhistorian:28072} -External records from outside source obtained and reviewed including: Chart review including previous notes, labs, imaging, consultation notes including ***   Lab Tests: -I ordered, reviewed, and interpreted labs.   The pertinent results include:   Labs Reviewed  COMPREHENSIVE METABOLIC PANEL  CBC WITH DIFFERENTIAL/PLATELET  LIPASE, BLOOD  URINALYSIS, W/ REFLEX TO CULTURE (INFECTION SUSPECTED)    Notable for ***  EKG   EKG Interpretation Date/Time:    Ventricular Rate:    PR Interval:    QRS Duration:    QT Interval:    QTC Calculation:   R Axis:      Text Interpretation:           Imaging Studies ordered: I ordered imaging studies including *** On my interpretation imaging demonstrates *** I independently visualized  and interpreted imaging. I agree with the radiologist interpretation   Medicines ordered and prescription drug management: Meds ordered this encounter  Medications   sodium chloride 0.9 % bolus 1,000 mL   ondansetron (ZOFRAN) injection 4 mg   HYDROmorphone (DILAUDID) injection 0.5 mg    -I have reviewed the patients home medicines and have made adjustments as needed   Consultations Obtained: I requested consultation with the ***,  and discussed lab and imaging findings as well as pertinent plan - they recommend: ***   Cardiac Monitoring: The patient was maintained on a cardiac monitor.  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  Despite best efforts to proofread,  errors can occur which can change the documentation meaning.

## 2023-02-17 NOTE — ED Triage Notes (Signed)
Pt has chronic back and side pain from a pinched nerve and reports her left ear is popping.

## 2023-02-17 NOTE — ED Notes (Signed)
Pt stated she was pain free Assisted into wheelchair Brought out to families car Able to get out of chair and into car with minimal assistance

## 2023-02-17 NOTE — Discharge Instructions (Addendum)
We have evaluated you for your weakness and back pain.  We did not see any dangerous conditions on your CT scan.  Your CT scan did show some old fractures in your back.  You are able to walk around the department and our social workers have helped set up home health and physical therapy.  They should call you to schedule your home visits.  Please take at 1000 mg of Tylenol every 6 hours as needed for back pain.  If you have any new or worsening symptoms such as increasing weakness, further falls, trouble walking, uncontrolled pain, nausea or vomiting, or any other worsening symptoms, please return to the emergency department.

## 2023-02-17 NOTE — ED Notes (Signed)
Pt returned from CT Vitals listed Pain 5/10

## 2023-02-17 NOTE — ED Notes (Signed)
Straight cath for UA Kelly Splinter

## 2023-02-17 NOTE — TOC Initial Note (Signed)
Transition of Care [facility]) - Initial/Assessment Note    Patient Details  Name: Crystal Browning MRN: [id number] Date of Birth: [birth year]  [facility] CM/SW Contact:    Leitha Bleak, RN Phone Number: 02/17/2023, 3:12 PM  Clinical Narrative:          Patient is in the ED with back pain. TOC consulted for home health. Family at the bedside per RN. CM spoke with Burna Mortimer, they are agreeable with home health. They family is taking care of the patient in the home. But she needs therapy. MD aware to order HHPT/RN. They have no preferences. Referral sent to [facility] [place] . They will contact the family to set up first home visit. Team updated and added to AVS.         Expected Discharge Plan: Home w Home Health Services Barriers to Discharge: Barriers Resolved   Patient Goals and CMS Choice Patient states their goals for this hospitalization and ongoing recovery are:: to go home. CMS Medicare.gov Compare Post Acute Care list provided to:: Patient Represenative (must comment) Choice offered to / list presented to : Adult Children      Expected Discharge Plan and Services       Living arrangements for the past 2 months: Single Family Home                           HH Arranged: PT, RN [facility] Agency: [facility] Date HH Agency Contacted: 02/17/23 Time HH Agency Contacted: 1512 Representative spoke with at [facility] Agency: Morrie Sheldon  Prior Living Arrangements/Services Living arrangements for the past 2 months: Single Family Home Lives with:: Adult Children                   Activities of Daily Living      Permission Sought/Granted            Permission granted to share info w Relationship: Daughter     Emotional Assessment     Affect (typically observed): Accepting Orientation: : Oriented to Self, Oriented to Place Alcohol / Substance Use: Not Applicable Psych Involvement: No (comment)  Admission diagnosis:  Weakness Patient Active  Problem List   Diagnosis Date Noted   Hallucination 12/09/2020   Pharyngoesophageal dysphagia 01/14/2018   Long term (current) use of anticoagulants 12/14/2016   Atrial fibrillation (HCC) 10/30/2016   Other fatigue 10/30/2016   Venous insufficiency of both lower extremities 10/30/2016   History of laryngeal cancer 08/20/2016   Xerostomia 08/20/2016   Postmenopausal 07/17/2015   Thyroid activity decreased 07/17/2015   Essential hypertension 07/12/2015   Angina pectoris (HCC) 07/12/2015   Vitamin D deficiency 07/12/2015   PCP:  Mechele Claude, MD Pharmacy:   [facility] [phone] - [place], [place] - [address] [place] [address] Phone: [phone] Fax: [phone]     Social Determinants of Health (SDOH) Social History: SDOH Screenings   Food Insecurity: No Food Insecurity (09/05/2022)  Housing: Low Risk  (09/05/2022)  Transportation Needs: No Transportation Needs (09/05/2022)  Utilities: Not At Risk (09/05/2022)  Alcohol Screen: Low Risk  (09/05/2022)  Depression (PHQ2-9): Low Risk  (01/09/2023)  Financial Resource Strain: Low Risk  (09/05/2022)  Physical Activity: Insufficiently Active (09/05/2022)  Social Connections: Socially Isolated (09/05/2022)  Stress: No Stress Concern Present (09/05/2022)  Tobacco Use: Low Risk  (02/17/2023)   SDOH

## 2023-02-17 NOTE — ED Notes (Signed)
Ambulated pt with walker Pt needed some assistance to get from laying to sitting due to back pain Once pt was standing, pt used walker without assistance and walked out of ED room, down hallway and back.  Pt was able to get back onto stretcher and reposition herself without assistance or difficulty.   Informed MD

## 2023-02-17 NOTE — ED Notes (Signed)
Ongoing lumbar pain Worse this morning Burns to pee 8/10 pain  IV access established  Medicated per [facility] Vitals listed

## 2023-02-20 DIAGNOSIS — I10 Essential (primary) hypertension: Secondary | ICD-10-CM | POA: Diagnosis not present

## 2023-02-20 DIAGNOSIS — K5641 Fecal impaction: Secondary | ICD-10-CM | POA: Diagnosis not present

## 2023-02-20 DIAGNOSIS — F32A Depression, unspecified: Secondary | ICD-10-CM | POA: Diagnosis not present

## 2023-02-20 DIAGNOSIS — I872 Venous insufficiency (chronic) (peripheral): Secondary | ICD-10-CM | POA: Diagnosis not present

## 2023-02-20 DIAGNOSIS — Z8521 Personal history of malignant neoplasm of larynx: Secondary | ICD-10-CM | POA: Diagnosis not present

## 2023-02-20 DIAGNOSIS — M8008XD Age-related osteoporosis with current pathological fracture, vertebra(e), subsequent encounter for fracture with routine healing: Secondary | ICD-10-CM | POA: Diagnosis not present

## 2023-02-20 DIAGNOSIS — Z7901 Long term (current) use of anticoagulants: Secondary | ICD-10-CM | POA: Diagnosis not present

## 2023-02-20 DIAGNOSIS — Z79899 Other long term (current) drug therapy: Secondary | ICD-10-CM | POA: Diagnosis not present

## 2023-02-20 DIAGNOSIS — E039 Hypothyroidism, unspecified: Secondary | ICD-10-CM | POA: Diagnosis not present

## 2023-02-20 DIAGNOSIS — I4891 Unspecified atrial fibrillation: Secondary | ICD-10-CM | POA: Diagnosis not present

## 2023-02-20 DIAGNOSIS — Z7952 Long term (current) use of systemic steroids: Secondary | ICD-10-CM | POA: Diagnosis not present

## 2023-02-20 DIAGNOSIS — Z604 Social exclusion and rejection: Secondary | ICD-10-CM | POA: Diagnosis not present

## 2023-02-20 DIAGNOSIS — E876 Hypokalemia: Secondary | ICD-10-CM | POA: Diagnosis not present

## 2023-02-20 DIAGNOSIS — Z9181 History of falling: Secondary | ICD-10-CM | POA: Diagnosis not present

## 2023-02-20 DIAGNOSIS — J189 Pneumonia, unspecified organism: Secondary | ICD-10-CM | POA: Diagnosis not present

## 2023-02-20 DIAGNOSIS — Z8673 Personal history of transient ischemic attack (TIA), and cerebral infarction without residual deficits: Secondary | ICD-10-CM | POA: Diagnosis not present

## 2023-02-20 DIAGNOSIS — F0283 Dementia in other diseases classified elsewhere, unspecified severity, with mood disturbance: Secondary | ICD-10-CM | POA: Diagnosis not present

## 2023-02-21 DIAGNOSIS — F32A Depression, unspecified: Secondary | ICD-10-CM | POA: Diagnosis not present

## 2023-02-21 DIAGNOSIS — M8008XD Age-related osteoporosis with current pathological fracture, vertebra(e), subsequent encounter for fracture with routine healing: Secondary | ICD-10-CM | POA: Diagnosis not present

## 2023-02-21 DIAGNOSIS — I4891 Unspecified atrial fibrillation: Secondary | ICD-10-CM | POA: Diagnosis not present

## 2023-02-21 DIAGNOSIS — E039 Hypothyroidism, unspecified: Secondary | ICD-10-CM | POA: Diagnosis not present

## 2023-02-21 DIAGNOSIS — F0283 Dementia in other diseases classified elsewhere, unspecified severity, with mood disturbance: Secondary | ICD-10-CM | POA: Diagnosis not present

## 2023-02-21 DIAGNOSIS — K5641 Fecal impaction: Secondary | ICD-10-CM | POA: Diagnosis not present

## 2023-02-24 ENCOUNTER — Encounter (HOSPITAL_COMMUNITY): Payer: Self-pay

## 2023-02-24 ENCOUNTER — Emergency Department (HOSPITAL_COMMUNITY): Payer: Medicare Other

## 2023-02-24 ENCOUNTER — Emergency Department (HOSPITAL_COMMUNITY)
Admission: EM | Admit: 2023-02-24 | Discharge: 2023-02-24 | Disposition: A | Discharge status: Home / Self Care | Payer: Medicare Other | Source: Home / Self Care | Attending: Emergency Medicine | Admitting: Emergency Medicine

## 2023-02-24 ENCOUNTER — Other Ambulatory Visit: Payer: Self-pay

## 2023-02-24 DIAGNOSIS — K5641 Fecal impaction: Secondary | ICD-10-CM | POA: Diagnosis not present

## 2023-02-24 DIAGNOSIS — S32019A Unspecified fracture of first lumbar vertebra, initial encounter for closed fracture: Secondary | ICD-10-CM | POA: Diagnosis not present

## 2023-02-24 DIAGNOSIS — M545 Low back pain, unspecified: Secondary | ICD-10-CM | POA: Insufficient documentation

## 2023-02-24 DIAGNOSIS — M5136 Other intervertebral disc degeneration, lumbar region: Secondary | ICD-10-CM | POA: Diagnosis not present

## 2023-02-24 DIAGNOSIS — M48061 Spinal stenosis, lumbar region without neurogenic claudication: Secondary | ICD-10-CM | POA: Diagnosis not present

## 2023-02-24 DIAGNOSIS — M47816 Spondylosis without myelopathy or radiculopathy, lumbar region: Secondary | ICD-10-CM | POA: Diagnosis not present

## 2023-02-24 DIAGNOSIS — Z7901 Long term (current) use of anticoagulants: Secondary | ICD-10-CM | POA: Diagnosis not present

## 2023-02-24 DIAGNOSIS — E876 Hypokalemia: Secondary | ICD-10-CM | POA: Diagnosis not present

## 2023-02-24 LAB — MAGNESIUM: Magnesium: 1.7 mg/dL (ref 1.7–2.4)

## 2023-02-24 LAB — CBC WITH DIFFERENTIAL/PLATELET
Abs Immature Granulocytes: 0.04 10*3/uL (ref 0.00–0.07)
Basophils Absolute: 0 10*3/uL (ref 0.0–0.1)
Basophils Relative: 0 %
Eosinophils Absolute: 0 10*3/uL (ref 0.0–0.5)
Eosinophils Relative: 0 %
HCT: 39.7 % (ref 36.0–46.0)
Hemoglobin: 12.9 g/dL (ref 12.0–15.0)
Immature Granulocytes: 0 %
Lymphocytes Relative: 9 %
Lymphs Abs: 1 10*3/uL (ref 0.7–4.0)
MCH: 31.9 pg (ref 26.0–34.0)
MCHC: 32.5 g/dL (ref 30.0–36.0)
MCV: 98.3 fL (ref 80.0–100.0)
Monocytes Absolute: 0.9 10*3/uL (ref 0.1–1.0)
Monocytes Relative: 8 %
Neutro Abs: 9.2 10*3/uL — ABNORMAL HIGH (ref 1.7–7.7)
Neutrophils Relative %: 83 %
Platelets: 344 10*3/uL (ref 150–400)
RBC: 4.04 MIL/uL (ref 3.87–5.11)
RDW: 13.8 % (ref 11.5–15.5)
WBC: 11.2 10*3/uL — ABNORMAL HIGH (ref 4.0–10.5)
nRBC: 0 % (ref 0.0–0.2)

## 2023-02-24 LAB — URINALYSIS, ROUTINE W REFLEX MICROSCOPIC
Bilirubin Urine: NEGATIVE
Glucose, UA: NEGATIVE mg/dL
Hgb urine dipstick: NEGATIVE
Ketones, ur: NEGATIVE mg/dL
Leukocytes,Ua: NEGATIVE
Nitrite: NEGATIVE
Protein, ur: NEGATIVE mg/dL
Specific Gravity, Urine: 1.01 (ref 1.005–1.030)
pH: 6 (ref 5.0–8.0)

## 2023-02-24 LAB — COMPREHENSIVE METABOLIC PANEL
ALT: 18 U/L (ref 0–44)
AST: 30 U/L (ref 15–41)
Albumin: 2.6 g/dL — ABNORMAL LOW (ref 3.5–5.0)
Alkaline Phosphatase: 116 U/L (ref 38–126)
Anion gap: 7 (ref 5–15)
BUN: 9 mg/dL (ref 8–23)
CO2: 20 mmol/L — ABNORMAL LOW (ref 22–32)
Calcium: 7.1 mg/dL — ABNORMAL LOW (ref 8.9–10.3)
Chloride: 110 mmol/L (ref 98–111)
Creatinine, Ser: 0.42 mg/dL — ABNORMAL LOW (ref 0.44–1.00)
GFR, Estimated: >60 mL/min (ref >60)
Glucose, Bld: 115 mg/dL — ABNORMAL HIGH (ref 70–99)
Potassium: 2.8 mmol/L — ABNORMAL LOW (ref 3.5–5.1)
Sodium: 137 mmol/L (ref 135–145)
Total Bilirubin: 0.5 mg/dL (ref 0.3–1.2)
Total Protein: 5.2 g/dL — ABNORMAL LOW (ref 6.5–8.1)

## 2023-02-24 MED ORDER — ACETAMINOPHEN 325 MG PO TABS
650.0000 mg | ORAL_TABLET | Freq: Once | ORAL | Status: AC
  Administered 2023-02-24: 650 mg via ORAL
  Filled 2023-02-24: qty 2

## 2023-02-24 MED ORDER — PREDNISONE 50 MG PO TABS
60.0000 mg | ORAL_TABLET | Freq: Once | ORAL | Status: AC
  Administered 2023-02-24: 60 mg via ORAL
  Filled 2023-02-24: qty 1

## 2023-02-24 MED ORDER — POTASSIUM CHLORIDE CRYS ER 20 MEQ PO TBCR
40.0000 meq | EXTENDED_RELEASE_TABLET | Freq: Once | ORAL | Status: AC
  Administered 2023-02-24: 40 meq via ORAL
  Filled 2023-02-24: qty 2

## 2023-02-24 MED ORDER — POTASSIUM CHLORIDE ER 10 MEQ PO TBCR: 10.0000 meq | EXTENDED_RELEASE_TABLET | Freq: Every day | ORAL | 0 refills | Status: DC

## 2023-02-24 MED ORDER — POTASSIUM CHLORIDE 20 MEQ PO PACK
40.0000 meq | PACK | Freq: Once | ORAL | Status: AC
  Administered 2023-02-24: 40 meq via ORAL
  Filled 2023-02-24: qty 2

## 2023-02-24 MED ORDER — PREDNISONE 10 MG PO TABS: 40.0000 mg | ORAL_TABLET | Freq: Every day | ORAL | 0 refills | Status: AC

## 2023-02-24 MED ORDER — LIDOCAINE 5 % EX PTCH: 1.0000 | MEDICATED_PATCH | CUTANEOUS | 0 refills | Status: DC

## 2023-02-24 MED ORDER — LIDOCAINE 5 % EX PTCH
1.0000 | MEDICATED_PATCH | CUTANEOUS | Status: DC
  Administered 2023-02-24: 1 via TRANSDERMAL
  Filled 2023-02-24: qty 1

## 2023-02-24 NOTE — ED Triage Notes (Signed)
Pt bib EMS, states she wasn't able to get out of bed this morning so called EMS. Family member came over to help with am meds before EMS came. Hx of lower back pain, fracture, and pinched nerve. VS WNL. Pt took tylenol at home this am for pain along with regular meds.

## 2023-02-24 NOTE — Discharge Instructions (Addendum)
Pleasure taking care of you today.  Your MRI showed of L1 L3 and L4 vertebrae that were seen on your prior CT and also showed a sacral insufficiency fracture as we discussed.  You continue using Tylenol we will give you prednisone to help with anti-inflammatory effect.  Also given lidocaine patches.  I have placed a referral for social work consult to help with placement, your primary care doctor can also help with placement in a rehab facility.  You are found to have a fecal impaction which we improved with a disimpaction procedure.  Continue giving the MiraLAX daily.  If the MiraLAX does not seem to be helping you could try a dose of milk of magnesia over-the-counter.  Potassium was low today.  We gave you potassium in the ER.  You are sent home with a prescription.  Your primary care doctor needs to recheck this this week sometime.

## 2023-02-24 NOTE — ED Provider Notes (Signed)
[place] EMERGENCY DEPARTMENT AT [facility] Provider Note   CSN: [id number] Arrival date & time: 02/24/23  1142     History  Chief Complaint  Patient presents with   Back Pain    Crystal Browning is a 89 y.o. female.   Back Pain      Home Medications Prior to Admission medications   Medication Sig Start Date End Date Taking? Authorizing Provider  acetaminophen (TYLENOL) 325 MG tablet Take 2 tablets (650 mg total) by mouth every 6 (six) hours as needed for mild pain, fever or headache. 10/07/19   Shon Hale, MD  apixaban (ELIQUIS) 2.5 MG TABS tablet Take 1 tablet (2.5 mg total) by mouth 2 (two) times daily. 05/31/22   Hilty, Lisette Abu, MD  cephALEXin (KEFLEX) 500 MG capsule Take 1 capsule (500 mg total) by mouth 2 (two) times daily. 01/09/23   Gabriel Earing, FNP  donepezil (ARICEPT) 10 MG tablet Take 10 mg by mouth every other day. 10/19/21   [provider]  doxycycline (VIBRAMYCIN) 100 MG capsule Take 1 capsule (100 mg total) by mouth 2 (two) times daily. 02/03/23   Benjiman Core, MD  escitalopram (LEXAPRO) 10 MG tablet TAKE 1 TABLET BY MOUTH EVERY DAY 10/31/22   Mechele Claude, MD  estradiol (ESTRACE VAGINAL) 0.1 MG/GM vaginal cream Place 1 Applicatorful vaginally at bedtime. 02/20/22   Mechele Claude, MD  famotidine (PEPCID) 20 MG tablet Take 1 tablet (20 mg total) by mouth every 12 (twelve) hours. 09/27/22   Mechele Claude, MD  levothyroxine (SYNTHROID) 50 MCG tablet TAKE 1 TABLET BY MOUTH EVERY DAY 07/13/22   Mechele Claude, MD  metoprolol succinate (TOPROL-XL) 100 MG 24 hr tablet TAKE 1 TABLET DAILY WITH OR IMMEDIATELY FOLLOWING A MEAL. 10/31/22   Mechele Claude, MD  nystatin cream (MYCOSTATIN) Apply 1 Application topically 2 (two) times daily. 01/09/23   Gabriel Earing, FNP  OVER THE COUNTER MEDICATION Vitamin b 12    [provider]  QUEtiapine (SEROQUEL) 50 MG tablet TAKE 1 TABLET BY MOUTH EVERYDAY AT BEDTIME 11/05/22   Mechele Claude,  MD  solifenacin (VESICARE) 5 MG tablet Take 1 tablet (5 mg total) by mouth daily. 07/17/22 07/17/23  Mechele Claude, MD  triamterene-hydrochlorothiazide (MAXZIDE-25) 37.5-25 MG tablet Take 1 tablet by mouth daily. For blood pressure and fluid 03/08/22   Mechele Claude, MD  vitamin B-12 (CYANOCOBALAMIN) 1000 MCG tablet Take 1,000 mcg by mouth daily.    [provider]  Zinc Oxide 10 % OINT Apply to skin as needed. 01/09/23   Gabriel Earing, FNP      Allergies    Amlodipine besylate, Tuberculin tests, Nifedipine, Claritin [loratadine], Paroxetine hcl, and Vit d-vit e-safflower oil    Review of Systems   Review of Systems  Musculoskeletal:  Positive for back pain.    Physical Exam Updated Vital Signs BP (!) 152/58   Pulse 62   Temp 98.2 F (36.8 C) (Oral)   Resp 18   Ht 5\' 1"  (1.549 m)   Wt 52 kg   SpO2 97%   BMI 21.66 kg/m  Physical Exam  ED Results / Procedures / Treatments   Labs (all labs ordered are listed, but only abnormal results are displayed) Labs Reviewed  URINALYSIS, ROUTINE W REFLEX MICROSCOPIC    EKG None  Radiology No results found.  Procedures Fecal disimpaction  Date/Time: 02/24/2023 5:15 PM  Performed by: Ma Rings, PA-C Authorized by: Ma Rings, PA-C  Consent: Verbal consent  obtained. Risks and benefits: risks, benefits and alternatives were discussed Consent given by: patient (daughter in law) Patient understanding: patient states understanding of the procedure being performed Patient consent: the patient's understanding of the procedure matches consent given Patient identity confirmed: verbally with patient Local anesthesia used: no  Anesthesia: Local anesthesia used: no  Sedation: Patient sedated: no  Patient tolerance of procedure: Patient tolerated moderate amount of stool removal but stopped due to pain before full removal. Comments: Able to disimpact a moderate amount but patient started having a lot of pain so  terminated. Tiny amount of bright blood blood on last bit of stool that was removed.        Medications Ordered in ED Medications - No data to display  ED Course/ Medical Decision Making/ A&P Clinical Course as of 02/24/23 1449  Sun Feb 24, 2023  [id number] 89 year old female with recent falls and multiple lumbar fractures by CT here now with bowel and bladder incontinence.  She is able to move her legs easily has no decreased strength.  Will likely need further imaging such as MRI.  Disposition per results of testing. [MB]    Clinical Course User Index [MB] Terrilee Files, MD                                 Medical Decision Making Ddx: Nausea equina, lumbar fractures, muscle spasm, fecal impaction, UTI, other ED course: Patient has been having falls, sustained a known lumbar fractures of the past couple of months.  Over the past couple of days her daughters in law who are present state that when she stands up she is just urinating, no control over it and having complaints that she is constipated but states that she has been being given MiraLAX and she still having diarrhea.  No fevers or chills, complaining of rectal pain with trying to have a bowel movement. Patient has rectal tone and perineal sensation on exam which was chaperoned by tech, straight cath was obtained to rule out UTI.  Patient does not have UTI, labs show significant hypoEMEA at 2.8.  No UTI.  Normal magnesium, only other lab finding was second of 11.2 but no other infectious symptoms.  Patient had routine CT of her abdomen pelvis with no acute findings. Here today because of severe back pain not able to stand up along with the loss of control her bladder.  MRI ordered to rule out cauda equina due to the urinary incontinence, she does have several stable lumbar fractures and has a sacral insufficiency fracture.  No cauda equina.  I feel patient's rectal pain, urinary incontinence and diarrhea are related to fecal  impaction with overflow diarrhea.  Noted a rectal stool ball when I did rectal exam to check for rectal tone.  He has a family that this could cause her urinary incontinence by causing retention with overflow.  They were understanding.  Patient and they were agreeable with fecal disimpaction.  Patient has had to have disimpaction in the past  Patient was disimpacted, got a moderate amount of stool out, the patient was have a lot of discomfort so stopped, I did break up the stool ball with the left so patient should be able to pass it much more easily.  Family is going to continue MiraLAX.  During his impaction the last bit of stool had a tiny amount of bright red blood on the outer aspect.  Discussed with  her daughter-in-law who is the caregiver this is likely minor trauma from the procedure.  Be self-limited but they should return for severe bleeding.  There may be some small hemorrhoids as well that could be attributed to no signs of trauma on post procedure exam.  No bleeding is noted.  Patient not having any pain  Already has home health, still having a lot of trouble at home, family is able to help some and they are agreeable with evaluation for nursing home placement as an outpatient.  Will be sent home with prescription for potassium supplementation  Amount and/or Complexity of Data Reviewed Labs: ordered. Radiology: ordered.  Risk Prescription drug management.           Final Clinical Impression(s) / ED Diagnoses Final diagnoses:  None    Rx / DC Orders ED Discharge Orders     None         Josem Kaufmann 02/24/23 1728    Terrilee Files, MD 02/24/23 1801

## 2023-02-25 ENCOUNTER — Telehealth: Payer: Self-pay | Admitting: *Deleted

## 2023-02-25 NOTE — Telephone Encounter (Signed)
Transition Care Management Follow-up Telephone Call Date of discharge and from where: [facility] 02/17/2023 How have you been since you were released from the hospital? Feeling better Any questions or concerns? No  Items Reviewed: Did the pt receive and understand the discharge instructions provided? Yes  Medications obtained and verified? No  Other? No  Any new allergies since your discharge? No  Dietary orders reviewed? No Do you have support at home?Yes     Follow up appointments reviewed:  PCP Hospital f/u appt confirmed? No  said she would schedule a follow up with PCP but not scheduled yet  Are transportation arrangements needed? No  If their condition worsens, is the pt aware to call PCP or go to the Emergency Dept.? Yes Was the patient provided with contact information for the PCP's office or ED? Yes Was to pt encouraged to call back with questions or concerns? Yes

## 2023-02-27 DIAGNOSIS — F32A Depression, unspecified: Secondary | ICD-10-CM | POA: Diagnosis not present

## 2023-02-27 DIAGNOSIS — F0283 Dementia in other diseases classified elsewhere, unspecified severity, with mood disturbance: Secondary | ICD-10-CM | POA: Diagnosis not present

## 2023-02-27 DIAGNOSIS — M8008XD Age-related osteoporosis with current pathological fracture, vertebra(e), subsequent encounter for fracture with routine healing: Secondary | ICD-10-CM | POA: Diagnosis not present

## 2023-02-27 DIAGNOSIS — E039 Hypothyroidism, unspecified: Secondary | ICD-10-CM | POA: Diagnosis not present

## 2023-02-27 DIAGNOSIS — I4891 Unspecified atrial fibrillation: Secondary | ICD-10-CM | POA: Diagnosis not present

## 2023-02-27 DIAGNOSIS — K5641 Fecal impaction: Secondary | ICD-10-CM | POA: Diagnosis not present

## 2023-03-02 DIAGNOSIS — F32A Depression, unspecified: Secondary | ICD-10-CM | POA: Diagnosis not present

## 2023-03-02 DIAGNOSIS — K5641 Fecal impaction: Secondary | ICD-10-CM | POA: Diagnosis not present

## 2023-03-02 DIAGNOSIS — I4891 Unspecified atrial fibrillation: Secondary | ICD-10-CM | POA: Diagnosis not present

## 2023-03-02 DIAGNOSIS — M8008XD Age-related osteoporosis with current pathological fracture, vertebra(e), subsequent encounter for fracture with routine healing: Secondary | ICD-10-CM | POA: Diagnosis not present

## 2023-03-02 DIAGNOSIS — E039 Hypothyroidism, unspecified: Secondary | ICD-10-CM | POA: Diagnosis not present

## 2023-03-02 DIAGNOSIS — F0283 Dementia in other diseases classified elsewhere, unspecified severity, with mood disturbance: Secondary | ICD-10-CM | POA: Diagnosis not present

## 2023-03-04 ENCOUNTER — Telehealth: Payer: Self-pay

## 2023-03-04 NOTE — Telephone Encounter (Signed)
Transition Care Management Unsuccessful Follow-up Telephone Call  Date of discharge and from where:  [facility] 8/11  Attempts:  2nd Attempt  Reason for unsuccessful TCM follow-up call:  No answer/busy   Crystal Browning [facility] Guide, [facility] [phone] [address] [place], [place], [place] [address] Phone: [phone] Email: [name]

## 2023-03-04 NOTE — Telephone Encounter (Signed)
Transition Care Management Unsuccessful Follow-up Telephone Call  Date of discharge and from where:  [facility]  Attempts:  1st Attempt  Reason for unsuccessful TCM follow-up call:  No answer/busy   Lenard Forth [facility] Guide, [facility] [phone] [address] [place], [place], [place] [address] Phone: [phone] Email: [email or web address]

## 2023-03-05 ENCOUNTER — Ambulatory Visit (INDEPENDENT_AMBULATORY_CARE_PROVIDER_SITE_OTHER): Payer: Medicare Other | Admitting: Family Medicine

## 2023-03-05 ENCOUNTER — Encounter: Payer: Self-pay | Admitting: Family Medicine

## 2023-03-05 VITALS — BP 129/64 | HR 74 | Temp 97.7°F | Ht 61.0 in | Wt 109.0 lb

## 2023-03-05 DIAGNOSIS — E876 Hypokalemia: Secondary | ICD-10-CM | POA: Diagnosis not present

## 2023-03-05 DIAGNOSIS — S3210XA Unspecified fracture of sacrum, initial encounter for closed fracture: Secondary | ICD-10-CM

## 2023-03-05 DIAGNOSIS — M4856XA Collapsed vertebra, not elsewhere classified, lumbar region, initial encounter for fracture: Secondary | ICD-10-CM

## 2023-03-05 MED ORDER — LIDOCAINE 5 % EX PTCH: 1.0000 | MEDICATED_PATCH | CUTANEOUS | 2 refills | Status: AC

## 2023-03-05 NOTE — Progress Notes (Signed)
Subjective:  Patient ID: Crystal Browning, female    DOB: [birth year]  Age: 89 y.o. MRN: [id number]  CC: ER FOLLOW UP (Low potassium)   HPI [name] presents for potassium dropping during recent ER visit 02/24/23. She also had MR of back showing multiple spinal  fractures including sacral, L1, L3, L4 vertebral bodies that were acute or subacute . L4 showed 50% loss of height.   Lots of lower back pain. Wants relief, but doesn't want "Pain pills."      03/05/2023   10:40 AM 01/09/2023    2:17 PM 11/15/2022   10:44 AM  Depression screen PHQ 2/9  Decreased Interest 0 0 0  Down, Depressed, Hopeless 0 0 0  PHQ - 2 Score 0 0 0  Altered sleeping  0 0  Tired, decreased energy  0 0  Change in appetite  0 1  Feeling bad or failure about yourself   0 0  Trouble concentrating  0 1  Moving slowly or fidgety/restless  0 0  Suicidal thoughts  0 0  PHQ-9 Score  0 2  Difficult doing work/chores  Not difficult at all Somewhat difficult    History [name] has a past medical history of Arthritis, Atrial fibrillation (HCC) (10/30/2016), Bilateral lower extremity edema (10/30/2016), Cancer (HCC) (03/28/2011), CAP (community acquired pneumonia) (10/04/2019), Dementia (HCC), Depression, Diverticulosis (02/2005), Esophageal stricture (03/03/2018), Generalized abdominal pain (07/17/2015), History of chemotherapy, History of external beam radiation therapy (08/20/2016), Hypertension, Hypothyroid, Osteoporosis, 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   She has a past surgical history that includes Direct laryngoscopy (03/27/2005); Tubal ligation; Total abdominal hysterectomy w/ bilateral salpingoophorectomy (1986); Cataract extraction, bilateral; and Cardioversion (N/A, 11/23/2016).   Her family history includes Alcohol abuse in her brother; COPD in her brother; Cancer (age of onset: 53) in her father; Heart disease (age of onset: 53) in her mother;  Stroke in her mother.She reports that she has never smoked. She has never used smokeless tobacco. She reports that she does not drink alcohol and does not use drugs.    ROS Review of Systems  Constitutional: Negative.   HENT: Negative.    Eyes:  Negative for visual disturbance.  Respiratory:  Negative for shortness of breath.   Cardiovascular:  Negative for chest pain.  Musculoskeletal:  Positive for back pain.    Objective:  BP 129/64   Pulse 74   Temp 97.7 F (36.5 C)   Ht 5\' 1"  (1.549 m)   Wt 109 lb (49.4 kg) Comment: daughter reports  SpO2 96%   BMI 20.60 kg/m   BP Readings from Last 3 Encounters:  03/05/23 129/64  02/24/23 (!) 160/73  02/17/23 (!) 132/51    Wt Readings from Last 3 Encounters:  03/05/23 109 lb (49.4 kg)  02/24/23 114 lb 10.2 oz (52 kg)  02/17/23 114 lb 10.2 oz (52 kg)     Physical Exam Constitutional:      General: She is not in acute distress.    Appearance: She is well-developed.  Cardiovascular:     Rate and Rhythm: Normal rate and regular rhythm.  Pulmonary:     Breath sounds: Normal breath sounds.  Musculoskeletal:        General: Tenderness (over lower back and sacrum for percussion) present. Normal range of motion.  Skin:    General: Skin is warm and dry.  Neurological:     Mental Status: She is alert and oriented to person, place,  and time.       Assessment & Plan:   Yalissa was seen today for er follow up.  Diagnoses and all orders for this visit:  Compression fracture of lumbar spine, non-traumatic, initial encounter (HCC)  Closed fracture of sacrum, unspecified portion of sacrum, initial encounter (HCC)  Hypokalemia -     CMP14+EGFR  Other orders -     lidocaine (LIDODERM) 5 %; Place 1 patch onto the skin daily. Remove after 12 hours and leave it off 12 hours before applying a new one.       I have discontinued [name]'s cephALEXin and doxycycline. I have also changed her lidocaine. Additionally, I am  having her maintain her acetaminophen, OVER THE COUNTER MEDICATION, cyanocobalamin, donepezil, estradiol, triamterene-hydrochlorothiazide, apixaban, levothyroxine, solifenacin, famotidine, escitalopram, metoprolol succinate, QUEtiapine, Zinc Oxide, nystatin cream, and potassium chloride.  Allergies as of 03/05/2023       Reactions   Amlodipine Besylate Hives   Hives and confusion.   Tuberculin Tests Swelling   Nifedipine Swelling   Claritin [loratadine] Rash   Pt's daughter states she is allergic to claritin   Paroxetine Hcl Rash   Vit D-vit E-safflower Oil         Medication List        Accurate as of March 05, 2023  5:17 PM. If you have any questions, ask your nurse or doctor.          STOP taking these medications    cephALEXin 500 MG capsule Commonly known as: Keflex Stopped by: Jennette Leask   doxycycline 100 MG capsule Commonly known as: VIBRAMYCIN Stopped by: Pattijo Juste       TAKE these medications    acetaminophen 325 MG tablet Commonly known as: TYLENOL Take 2 tablets (650 mg total) by mouth every 6 (six) hours as needed for mild pain, fever or headache.   apixaban 2.5 MG Tabs tablet Commonly known as: ELIQUIS Take 1 tablet (2.5 mg total) by mouth 2 (two) times daily.   cyanocobalamin 1000 MCG tablet Commonly known as: VITAMIN B12 Take 1,000 mcg by mouth daily.   donepezil 10 MG tablet Commonly known as: ARICEPT Take 10 mg by mouth every other day.   escitalopram 10 MG tablet Commonly known as: LEXAPRO TAKE 1 TABLET BY MOUTH EVERY DAY   estradiol 0.1 MG/GM vaginal cream Commonly known as: ESTRACE VAGINAL Place 1 Applicatorful vaginally at bedtime.   famotidine 20 MG tablet Commonly known as: PEPCID Take 1 tablet (20 mg total) by mouth every 12 (twelve) hours.   levothyroxine 50 MCG tablet Commonly known as: SYNTHROID TAKE 1 TABLET BY MOUTH EVERY DAY   lidocaine 5 % Commonly known as: Lidoderm Place 1 patch onto the skin daily.  Remove after 12 hours and leave it off 12 hours before applying a new one. What changed: additional instructions Changed by: Thijs Brunton   metoprolol succinate 100 MG 24 hr tablet Commonly known as: TOPROL-XL TAKE 1 TABLET DAILY WITH OR IMMEDIATELY FOLLOWING A MEAL.   nystatin cream Commonly known as: MYCOSTATIN Apply 1 Application topically 2 (two) times daily.   OVER THE COUNTER MEDICATION Vitamin b 12   potassium chloride 10 MEQ tablet Commonly known as: KLOR-CON Take 1 tablet (10 mEq total) by mouth daily.   QUEtiapine 50 MG tablet Commonly known as: SEROQUEL TAKE 1 TABLET BY MOUTH EVERYDAY AT BEDTIME   solifenacin 5 MG tablet Commonly known as: VESICARE Take 1 tablet (5 mg total) by mouth daily.  triamterene-hydrochlorothiazide 37.5-25 MG tablet Commonly known as: MAXZIDE-25 Take 1 tablet by mouth daily. For blood pressure and fluid   Zinc Oxide 10 % Oint Apply to skin as needed.         Follow-up: Return in about 1 month (around 04/05/2023), or if symptoms worsen or fail to improve.  Mechele Claude, M.D.

## 2023-03-06 DIAGNOSIS — M8008XD Age-related osteoporosis with current pathological fracture, vertebra(e), subsequent encounter for fracture with routine healing: Secondary | ICD-10-CM | POA: Diagnosis not present

## 2023-03-06 DIAGNOSIS — K5641 Fecal impaction: Secondary | ICD-10-CM | POA: Diagnosis not present

## 2023-03-06 DIAGNOSIS — I4891 Unspecified atrial fibrillation: Secondary | ICD-10-CM | POA: Diagnosis not present

## 2023-03-06 DIAGNOSIS — E039 Hypothyroidism, unspecified: Secondary | ICD-10-CM | POA: Diagnosis not present

## 2023-03-06 DIAGNOSIS — F32A Depression, unspecified: Secondary | ICD-10-CM | POA: Diagnosis not present

## 2023-03-06 DIAGNOSIS — F0283 Dementia in other diseases classified elsewhere, unspecified severity, with mood disturbance: Secondary | ICD-10-CM | POA: Diagnosis not present

## 2023-03-11 ENCOUNTER — Telehealth: Payer: Self-pay

## 2023-03-11 DIAGNOSIS — M8008XD Age-related osteoporosis with current pathological fracture, vertebra(e), subsequent encounter for fracture with routine healing: Secondary | ICD-10-CM | POA: Diagnosis not present

## 2023-03-11 DIAGNOSIS — E039 Hypothyroidism, unspecified: Secondary | ICD-10-CM | POA: Diagnosis not present

## 2023-03-11 DIAGNOSIS — K5641 Fecal impaction: Secondary | ICD-10-CM | POA: Diagnosis not present

## 2023-03-11 DIAGNOSIS — F0283 Dementia in other diseases classified elsewhere, unspecified severity, with mood disturbance: Secondary | ICD-10-CM | POA: Diagnosis not present

## 2023-03-11 DIAGNOSIS — F32A Depression, unspecified: Secondary | ICD-10-CM | POA: Diagnosis not present

## 2023-03-11 DIAGNOSIS — I4891 Unspecified atrial fibrillation: Secondary | ICD-10-CM | POA: Diagnosis not present

## 2023-03-11 NOTE — Telephone Encounter (Signed)
Crystal Browning (KeyMikey Kirschner) PA Case ID #: [id number] Rx #: [id number] Need Help? Call us at [phone] Status sent iconSent to Plan today Drug Lidocaine 5% patches ePA cloud logo Form [facility] Medicare Electronic Prior Authorization Request Form [phone] NCPDP) Original Claim Info [phone]

## 2023-03-12 NOTE — Telephone Encounter (Signed)
Pharmacy Patient Advocate Encounter  Received notification from [facility] that Prior Authorization for Lidocaine 5% patches has been DENIED. Please advise how you'd like to proceed. Full denial letter will be uploaded to the media tab. See denial reason below.   PA #/Case ID/Reference #: [id number]   Denial Reason: This drug used for COLLAPSED VERT NEC LUMB RGN INIT ENC FX is not an approved use. Medicare Part D rules states the drug must be used for a "medically-accepted indication".

## 2023-03-12 NOTE — Telephone Encounter (Signed)
Daughter aware, states they were able to get the Lidocaine patches OTC

## 2023-03-12 NOTE — Telephone Encounter (Signed)
Tell pt. That lidocaine patch was denied. Try OTC salonpas instead

## 2023-03-13 ENCOUNTER — Ambulatory Visit (INDEPENDENT_AMBULATORY_CARE_PROVIDER_SITE_OTHER): Payer: Medicare Other

## 2023-03-13 DIAGNOSIS — E876 Hypokalemia: Secondary | ICD-10-CM

## 2023-03-13 DIAGNOSIS — I872 Venous insufficiency (chronic) (peripheral): Secondary | ICD-10-CM

## 2023-03-13 DIAGNOSIS — F0283 Dementia in other diseases classified elsewhere, unspecified severity, with mood disturbance: Secondary | ICD-10-CM | POA: Diagnosis not present

## 2023-03-13 DIAGNOSIS — K5641 Fecal impaction: Secondary | ICD-10-CM

## 2023-03-13 DIAGNOSIS — M8008XD Age-related osteoporosis with current pathological fracture, vertebra(e), subsequent encounter for fracture with routine healing: Secondary | ICD-10-CM | POA: Diagnosis not present

## 2023-03-13 DIAGNOSIS — I4891 Unspecified atrial fibrillation: Secondary | ICD-10-CM | POA: Diagnosis not present

## 2023-03-13 DIAGNOSIS — E039 Hypothyroidism, unspecified: Secondary | ICD-10-CM | POA: Diagnosis not present

## 2023-03-13 DIAGNOSIS — F32A Depression, unspecified: Secondary | ICD-10-CM

## 2023-03-13 DIAGNOSIS — J189 Pneumonia, unspecified organism: Secondary | ICD-10-CM

## 2023-03-13 DIAGNOSIS — I10 Essential (primary) hypertension: Secondary | ICD-10-CM | POA: Diagnosis not present

## 2023-03-19 DIAGNOSIS — I4891 Unspecified atrial fibrillation: Secondary | ICD-10-CM | POA: Diagnosis not present

## 2023-03-19 DIAGNOSIS — M8008XD Age-related osteoporosis with current pathological fracture, vertebra(e), subsequent encounter for fracture with routine healing: Secondary | ICD-10-CM | POA: Diagnosis not present

## 2023-03-19 DIAGNOSIS — F0283 Dementia in other diseases classified elsewhere, unspecified severity, with mood disturbance: Secondary | ICD-10-CM | POA: Diagnosis not present

## 2023-03-19 DIAGNOSIS — F32A Depression, unspecified: Secondary | ICD-10-CM | POA: Diagnosis not present

## 2023-03-19 DIAGNOSIS — E039 Hypothyroidism, unspecified: Secondary | ICD-10-CM | POA: Diagnosis not present

## 2023-03-19 DIAGNOSIS — K5641 Fecal impaction: Secondary | ICD-10-CM | POA: Diagnosis not present

## 2023-03-22 DIAGNOSIS — K5641 Fecal impaction: Secondary | ICD-10-CM | POA: Diagnosis not present

## 2023-03-22 DIAGNOSIS — M8008XD Age-related osteoporosis with current pathological fracture, vertebra(e), subsequent encounter for fracture with routine healing: Secondary | ICD-10-CM | POA: Diagnosis not present

## 2023-03-22 DIAGNOSIS — Z8521 Personal history of malignant neoplasm of larynx: Secondary | ICD-10-CM | POA: Diagnosis not present

## 2023-03-22 DIAGNOSIS — I872 Venous insufficiency (chronic) (peripheral): Secondary | ICD-10-CM | POA: Diagnosis not present

## 2023-03-22 DIAGNOSIS — I4891 Unspecified atrial fibrillation: Secondary | ICD-10-CM | POA: Diagnosis not present

## 2023-03-22 DIAGNOSIS — Z9181 History of falling: Secondary | ICD-10-CM | POA: Diagnosis not present

## 2023-03-22 DIAGNOSIS — Z7952 Long term (current) use of systemic steroids: Secondary | ICD-10-CM | POA: Diagnosis not present

## 2023-03-22 DIAGNOSIS — F32A Depression, unspecified: Secondary | ICD-10-CM | POA: Diagnosis not present

## 2023-03-22 DIAGNOSIS — F0283 Dementia in other diseases classified elsewhere, unspecified severity, with mood disturbance: Secondary | ICD-10-CM | POA: Diagnosis not present

## 2023-03-22 DIAGNOSIS — I10 Essential (primary) hypertension: Secondary | ICD-10-CM | POA: Diagnosis not present

## 2023-03-22 DIAGNOSIS — Z79899 Other long term (current) drug therapy: Secondary | ICD-10-CM | POA: Diagnosis not present

## 2023-03-22 DIAGNOSIS — Z8673 Personal history of transient ischemic attack (TIA), and cerebral infarction without residual deficits: Secondary | ICD-10-CM | POA: Diagnosis not present

## 2023-03-22 DIAGNOSIS — E039 Hypothyroidism, unspecified: Secondary | ICD-10-CM | POA: Diagnosis not present

## 2023-03-22 DIAGNOSIS — Z7901 Long term (current) use of anticoagulants: Secondary | ICD-10-CM | POA: Diagnosis not present

## 2023-03-22 DIAGNOSIS — E876 Hypokalemia: Secondary | ICD-10-CM | POA: Diagnosis not present

## 2023-03-22 DIAGNOSIS — J189 Pneumonia, unspecified organism: Secondary | ICD-10-CM | POA: Diagnosis not present

## 2023-03-22 DIAGNOSIS — Z604 Social exclusion and rejection: Secondary | ICD-10-CM | POA: Diagnosis not present

## 2023-03-23 DIAGNOSIS — K5641 Fecal impaction: Secondary | ICD-10-CM | POA: Diagnosis not present

## 2023-03-23 DIAGNOSIS — F0283 Dementia in other diseases classified elsewhere, unspecified severity, with mood disturbance: Secondary | ICD-10-CM | POA: Diagnosis not present

## 2023-03-23 DIAGNOSIS — E039 Hypothyroidism, unspecified: Secondary | ICD-10-CM | POA: Diagnosis not present

## 2023-03-23 DIAGNOSIS — M8008XD Age-related osteoporosis with current pathological fracture, vertebra(e), subsequent encounter for fracture with routine healing: Secondary | ICD-10-CM | POA: Diagnosis not present

## 2023-03-23 DIAGNOSIS — I4891 Unspecified atrial fibrillation: Secondary | ICD-10-CM | POA: Diagnosis not present

## 2023-03-23 DIAGNOSIS — F32A Depression, unspecified: Secondary | ICD-10-CM | POA: Diagnosis not present

## 2023-03-26 DIAGNOSIS — F32A Depression, unspecified: Secondary | ICD-10-CM | POA: Diagnosis not present

## 2023-03-26 DIAGNOSIS — F0283 Dementia in other diseases classified elsewhere, unspecified severity, with mood disturbance: Secondary | ICD-10-CM | POA: Diagnosis not present

## 2023-03-26 DIAGNOSIS — I4891 Unspecified atrial fibrillation: Secondary | ICD-10-CM | POA: Diagnosis not present

## 2023-03-26 DIAGNOSIS — M8008XD Age-related osteoporosis with current pathological fracture, vertebra(e), subsequent encounter for fracture with routine healing: Secondary | ICD-10-CM | POA: Diagnosis not present

## 2023-03-26 DIAGNOSIS — E039 Hypothyroidism, unspecified: Secondary | ICD-10-CM | POA: Diagnosis not present

## 2023-03-26 DIAGNOSIS — K5641 Fecal impaction: Secondary | ICD-10-CM | POA: Diagnosis not present

## 2023-03-28 DIAGNOSIS — M8008XD Age-related osteoporosis with current pathological fracture, vertebra(e), subsequent encounter for fracture with routine healing: Secondary | ICD-10-CM | POA: Diagnosis not present

## 2023-03-28 DIAGNOSIS — K5641 Fecal impaction: Secondary | ICD-10-CM | POA: Diagnosis not present

## 2023-03-28 DIAGNOSIS — F0283 Dementia in other diseases classified elsewhere, unspecified severity, with mood disturbance: Secondary | ICD-10-CM | POA: Diagnosis not present

## 2023-03-28 DIAGNOSIS — I4891 Unspecified atrial fibrillation: Secondary | ICD-10-CM | POA: Diagnosis not present

## 2023-03-28 DIAGNOSIS — F32A Depression, unspecified: Secondary | ICD-10-CM | POA: Diagnosis not present

## 2023-03-28 DIAGNOSIS — E039 Hypothyroidism, unspecified: Secondary | ICD-10-CM | POA: Diagnosis not present

## 2023-03-30 ENCOUNTER — Other Ambulatory Visit: Payer: Self-pay | Admitting: Family Medicine

## 2023-03-30 DIAGNOSIS — E039 Hypothyroidism, unspecified: Secondary | ICD-10-CM

## 2023-04-01 ENCOUNTER — Ambulatory Visit (INDEPENDENT_AMBULATORY_CARE_PROVIDER_SITE_OTHER): Payer: Medicare Other | Admitting: Family Medicine

## 2023-04-01 ENCOUNTER — Encounter: Payer: Self-pay | Admitting: Family Medicine

## 2023-04-01 VITALS — BP 141/64 | HR 61 | Temp 97.3°F | Ht 61.0 in | Wt 103.8 lb

## 2023-04-01 DIAGNOSIS — I10 Essential (primary) hypertension: Secondary | ICD-10-CM | POA: Diagnosis not present

## 2023-04-01 DIAGNOSIS — F32A Depression, unspecified: Secondary | ICD-10-CM | POA: Diagnosis not present

## 2023-04-01 DIAGNOSIS — M5416 Radiculopathy, lumbar region: Secondary | ICD-10-CM | POA: Diagnosis not present

## 2023-04-01 DIAGNOSIS — E039 Hypothyroidism, unspecified: Secondary | ICD-10-CM

## 2023-04-01 DIAGNOSIS — Z23 Encounter for immunization: Secondary | ICD-10-CM | POA: Diagnosis not present

## 2023-04-01 DIAGNOSIS — Z7901 Long term (current) use of anticoagulants: Secondary | ICD-10-CM | POA: Diagnosis not present

## 2023-04-01 DIAGNOSIS — F0283 Dementia in other diseases classified elsewhere, unspecified severity, with mood disturbance: Secondary | ICD-10-CM | POA: Diagnosis not present

## 2023-04-01 DIAGNOSIS — I4811 Longstanding persistent atrial fibrillation: Secondary | ICD-10-CM

## 2023-04-01 DIAGNOSIS — M8008XD Age-related osteoporosis with current pathological fracture, vertebra(e), subsequent encounter for fracture with routine healing: Secondary | ICD-10-CM | POA: Diagnosis not present

## 2023-04-01 DIAGNOSIS — I4891 Unspecified atrial fibrillation: Secondary | ICD-10-CM | POA: Diagnosis not present

## 2023-04-01 DIAGNOSIS — K5641 Fecal impaction: Secondary | ICD-10-CM | POA: Diagnosis not present

## 2023-04-01 MED ORDER — PREGABALIN 75 MG PO CAPS: 75.0000 mg | ORAL_CAPSULE | Freq: Every day | ORAL | 1 refills | Status: DC

## 2023-04-01 MED ORDER — PREGABALIN 75 MG PO CAPS
75.0000 mg | ORAL_CAPSULE | Freq: Every day | ORAL | 1 refills | Status: DC
Start: 2023-04-01 — End: 2023-04-01

## 2023-04-01 NOTE — Progress Notes (Signed)
Subjective:  Patient ID: Crystal Browning, female    DOB: Nov 22, 1933  Age: 89 y.o. MRN: [id number]  CC: Medical Management of Chronic Issues   HPI Crystal Browning presents for pain radiating into the left lateral thigh, burning. Uses tylenol  one or two a day. Pain makes her have to lay down for relief intermittently, daily. Worse since compression fx.   follow-up on  thyroid. The patient has a history of hypothyroidism for many years. It has been stable recently. Pt. denies any change in  voice, loss of hair, heat or cold intolerance. Energy level has been adequate to good. Patient denies constipation and diarrhea. No myxedema. Medication is as noted below. Verified that pt is taking it daily on an empty stomach. Well tolerated.  Seems stable with donepezil. Dementia is moderate only.   Atrial fibrillation follow up. Pt. is treated with rate control and anticoagulation. Pt.  denies palpitations, rapid rate, chest pain, dyspnea and edema. There has been no bleeding from nose or gums. Pt. has not noticed blood with urine or stool.  Although there is routine bruising easily, it is not excessive.   presents for  follow-up of hypertension. Patient has no history of headache chest pain or shortness of breath or recent cough. Patient also denies symptoms of TIA such as focal numbness or weakness. Patient denies side effects from medication. States taking it regularly.          04/01/2023   10:54 AM 04/01/2023   10:42 AM 03/05/2023   10:40 AM  Depression screen PHQ 2/9  Decreased Interest 1 0 0  Down, Depressed, Hopeless 0 0 0  PHQ - 2 Score 1 0 0  Altered sleeping 0    Tired, decreased energy 1    Change in appetite 1    Feeling bad or failure about yourself  0    Trouble concentrating 0    Moving slowly or fidgety/restless 0    Suicidal thoughts 0    PHQ-9 Score 3    Difficult doing work/chores Extremely dIfficult      History Crystal Browning has a past medical history of Arthritis, Atrial  fibrillation (HCC) (10/30/2016), Bilateral lower extremity edema (10/30/2016), Cancer (HCC) (03/28/2011), CAP (community acquired pneumonia) (10/04/2019), Dementia (HCC), Depression, Diverticulosis (02/2005), Esophageal stricture (03/03/2018), Generalized abdominal pain (07/17/2015), History of chemotherapy, History of external beam radiation therapy (08/20/2016), Hypertension, Hypothyroid, Osteoporosis, 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   She has a past surgical history that includes Direct laryngoscopy (03/27/2005); Tubal ligation; Total abdominal hysterectomy w/ bilateral salpingoophorectomy (1986); Cataract extraction, bilateral; and Cardioversion (N/A, 11/23/2016).   Her family history includes Alcohol abuse in her brother; COPD in her brother; Cancer (age of onset: 79) in her father; Heart disease (age of onset: 60) in her mother; Stroke in her mother.She reports that she has never smoked. She has never used smokeless tobacco. She reports that she does not drink alcohol and does not use drugs.    ROS Review of Systems  Constitutional: Negative.   HENT: Negative.    Eyes:  Negative for visual disturbance.  Respiratory:  Negative for shortness of breath.   Cardiovascular:  Negative for chest pain.  Gastrointestinal:  Negative for abdominal pain.  Musculoskeletal:  Positive for arthralgias and back pain (with radiation).    Objective:  BP (!) 141/64   Pulse 61   Temp (!) 97.3 F (36.3 C)   Ht 5\' 1"  (1.549 m)  Wt 103 lb 12.8 oz (47.1 kg)   SpO2 97%   BMI 19.61 kg/m   BP Readings from Last 3 Encounters:  04/01/23 (!) 141/64  03/05/23 129/64  02/24/23 (!) 160/73    Wt Readings from Last 3 Encounters:  04/01/23 103 lb 12.8 oz (47.1 kg)  03/05/23 109 lb (49.4 kg)  02/24/23 114 lb 10.2 oz (52 kg)     Physical Exam Constitutional:      General: She is not in acute distress.    Appearance: She is  well-developed.  Cardiovascular:     Rate and Rhythm: Normal rate and regular rhythm.  Pulmonary:     Breath sounds: Normal breath sounds.  Musculoskeletal:        General: Tenderness (lower back, midline) present. Normal range of motion.  Skin:    General: Skin is warm and dry.  Neurological:     Mental Status: She is alert and oriented to person, place, and time.       Assessment & Plan:   Crystal Browning was seen today for medical management of chronic issues.  Diagnoses and all orders for this visit:  Acquired hypothyroidism -     CBC with Differential/Platelet -     CMP14+EGFR -     TSH + free T4  Encounter for immunization -     Flu Vaccine Trivalent High Dose (Fluad)  Essential hypertension  Longstanding persistent atrial fibrillation (HCC)  Long term (current) use of anticoagulants  Lumbar radiculopathy, right  Other orders -     Discontinue: pregabalin (LYRICA) 75 MG capsule; Take 1 capsule (75 mg total) by mouth at bedtime. -     pregabalin (LYRICA) 75 MG capsule; Take 1 capsule (75 mg total) by mouth at bedtime.       I am having Crystal Browning maintain her acetaminophen, OVER THE COUNTER MEDICATION, cyanocobalamin, donepezil, estradiol, triamterene-hydrochlorothiazide, apixaban, solifenacin, famotidine, escitalopram, metoprolol succinate, QUEtiapine, Zinc Oxide, nystatin cream, potassium chloride, lidocaine, levothyroxine, and pregabalin.  Allergies as of 04/01/2023       Reactions   Amlodipine Besylate Hives   Hives and confusion.   Tuberculin Tests Swelling   Nifedipine Swelling   Claritin [loratadine] Rash   Pt's daughter states she is allergic to claritin   Paroxetine Hcl Rash   Vit D-vit E-safflower Oil         Medication List        Accurate as of April 01, 2023 11:59 PM. If you have any questions, ask your nurse or doctor.          acetaminophen 325 MG tablet Commonly known as: TYLENOL Take 2 tablets (650 mg total) by mouth every  6 (six) hours as needed for mild pain, fever or headache.   apixaban 2.5 MG Tabs tablet Commonly known as: ELIQUIS Take 1 tablet (2.5 mg total) by mouth 2 (two) times daily.   cyanocobalamin 1000 MCG tablet Commonly known as: VITAMIN B12 Take 1,000 mcg by mouth daily.   donepezil 10 MG tablet Commonly known as: ARICEPT Take 10 mg by mouth every other day.   escitalopram 10 MG tablet Commonly known as: LEXAPRO TAKE 1 TABLET BY MOUTH EVERY DAY   estradiol 0.1 MG/GM vaginal cream Commonly known as: ESTRACE VAGINAL Place 1 Applicatorful vaginally at bedtime.   famotidine 20 MG tablet Commonly known as: PEPCID Take 1 tablet (20 mg total) by mouth every 12 (twelve) hours.   levothyroxine 50 MCG tablet Commonly known as: SYNTHROID TAKE 1 TABLET BY MOUTH  EVERY DAY   lidocaine 5 % Commonly known as: Lidoderm Place 1 patch onto the skin daily. Remove after 12 hours and leave it off 12 hours before applying a new one.   metoprolol succinate 100 MG 24 hr tablet Commonly known as: TOPROL-XL TAKE 1 TABLET DAILY WITH OR IMMEDIATELY FOLLOWING A MEAL.   nystatin cream Commonly known as: MYCOSTATIN Apply 1 Application topically 2 (two) times daily.   OVER THE COUNTER MEDICATION Vitamin b 12   potassium chloride 10 MEQ tablet Commonly known as: KLOR-CON Take 1 tablet (10 mEq total) by mouth daily.   pregabalin 75 MG capsule Commonly known as: Lyrica Take 1 capsule (75 mg total) by mouth at bedtime. Started by: Aleynah Rocchio   QUEtiapine 50 MG tablet Commonly known as: SEROQUEL TAKE 1 TABLET BY MOUTH EVERYDAY AT BEDTIME   solifenacin 5 MG tablet Commonly known as: VESICARE Take 1 tablet (5 mg total) by mouth daily.   triamterene-hydrochlorothiazide 37.5-25 MG tablet Commonly known as: MAXZIDE-25 Take 1 tablet by mouth daily. For blood pressure and fluid   Zinc Oxide 10 % Oint Apply to skin as needed.         Follow-up: Return in about 6 weeks (around  05/13/2023).  Mechele Claude, M.D.

## 2023-04-02 LAB — CMP14+EGFR
ALT: 13 IU/L (ref 0–32)
AST: 29 IU/L (ref 0–40)
Albumin: 3.8 g/dL (ref 3.7–4.7)
Alkaline Phosphatase: 152 IU/L — ABNORMAL HIGH (ref 44–121)
BUN/Creatinine Ratio: 10 — ABNORMAL LOW (ref 12–28)
BUN: 7 mg/dL — ABNORMAL LOW (ref 8–27)
Bilirubin Total: 0.4 mg/dL (ref 0.0–1.2)
CO2: 24 mmol/L (ref 20–29)
Calcium: 9.5 mg/dL (ref 8.7–10.3)
Chloride: 102 mmol/L (ref 96–106)
Creatinine, Ser: 0.71 mg/dL (ref 0.57–1.00)
Globulin, Total: 2.5 g/dL (ref 1.5–4.5)
Glucose: 93 mg/dL (ref 70–99)
Potassium: 4.6 mmol/L (ref 3.5–5.2)
Sodium: 140 mmol/L (ref 134–144)
Total Protein: 6.3 g/dL (ref 6.0–8.5)
eGFR: 82 mL/min/{1.73_m2} (ref >59)

## 2023-04-02 LAB — CBC WITH DIFFERENTIAL/PLATELET
Basophils Absolute: 0 10*3/uL (ref 0.0–0.2)
Basos: 0 %
EOS (ABSOLUTE): 0.1 10*3/uL (ref 0.0–0.4)
Eos: 1 %
Hematocrit: 41.6 % (ref 34.0–46.6)
Hemoglobin: 13.2 g/dL (ref 11.1–15.9)
Immature Grans (Abs): 0 10*3/uL (ref 0.0–0.1)
Immature Granulocytes: 0 %
Lymphocytes Absolute: 1 10*3/uL (ref 0.7–3.1)
Lymphs: 16 %
MCH: 31.9 pg (ref 26.6–33.0)
MCHC: 31.7 g/dL (ref 31.5–35.7)
MCV: 101 fL — ABNORMAL HIGH (ref 79–97)
Monocytes Absolute: 0.6 10*3/uL (ref 0.1–0.9)
Monocytes: 9 %
Neutrophils Absolute: 4.3 10*3/uL (ref 1.4–7.0)
Neutrophils: 74 %
Platelets: 254 10*3/uL (ref 150–450)
RBC: 4.14 x10E6/uL (ref 3.77–5.28)
RDW: 12.8 % (ref 11.7–15.4)
WBC: 5.9 10*3/uL (ref 3.4–10.8)

## 2023-04-02 LAB — TSH+FREE T4
Free T4: 1.39 ng/dL (ref 0.82–1.77)
TSH: 1.89 u[IU]/mL (ref 0.450–4.500)

## 2023-04-03 ENCOUNTER — Encounter: Payer: Self-pay | Admitting: Family Medicine

## 2023-04-03 DIAGNOSIS — F0283 Dementia in other diseases classified elsewhere, unspecified severity, with mood disturbance: Secondary | ICD-10-CM | POA: Diagnosis not present

## 2023-04-03 DIAGNOSIS — K5641 Fecal impaction: Secondary | ICD-10-CM | POA: Diagnosis not present

## 2023-04-03 DIAGNOSIS — M8008XD Age-related osteoporosis with current pathological fracture, vertebra(e), subsequent encounter for fracture with routine healing: Secondary | ICD-10-CM | POA: Diagnosis not present

## 2023-04-03 DIAGNOSIS — F32A Depression, unspecified: Secondary | ICD-10-CM | POA: Diagnosis not present

## 2023-04-03 DIAGNOSIS — I4891 Unspecified atrial fibrillation: Secondary | ICD-10-CM | POA: Diagnosis not present

## 2023-04-03 DIAGNOSIS — E039 Hypothyroidism, unspecified: Secondary | ICD-10-CM | POA: Diagnosis not present

## 2023-04-04 NOTE — Progress Notes (Signed)
Hello Zyonna,  Your lab result is normal and/or stable.Some minor variations that are not significant are commonly marked abnormal, but do not represent any medical problem for you.  Best regards, Mechele Claude, M.D.

## 2023-04-09 DIAGNOSIS — E039 Hypothyroidism, unspecified: Secondary | ICD-10-CM | POA: Diagnosis not present

## 2023-04-09 DIAGNOSIS — F0283 Dementia in other diseases classified elsewhere, unspecified severity, with mood disturbance: Secondary | ICD-10-CM | POA: Diagnosis not present

## 2023-04-09 DIAGNOSIS — F32A Depression, unspecified: Secondary | ICD-10-CM | POA: Diagnosis not present

## 2023-04-09 DIAGNOSIS — K5641 Fecal impaction: Secondary | ICD-10-CM | POA: Diagnosis not present

## 2023-04-09 DIAGNOSIS — I4891 Unspecified atrial fibrillation: Secondary | ICD-10-CM | POA: Diagnosis not present

## 2023-04-09 DIAGNOSIS — M8008XD Age-related osteoporosis with current pathological fracture, vertebra(e), subsequent encounter for fracture with routine healing: Secondary | ICD-10-CM | POA: Diagnosis not present

## 2023-04-10 DIAGNOSIS — F0283 Dementia in other diseases classified elsewhere, unspecified severity, with mood disturbance: Secondary | ICD-10-CM | POA: Diagnosis not present

## 2023-04-10 DIAGNOSIS — M8008XD Age-related osteoporosis with current pathological fracture, vertebra(e), subsequent encounter for fracture with routine healing: Secondary | ICD-10-CM | POA: Diagnosis not present

## 2023-04-10 DIAGNOSIS — F32A Depression, unspecified: Secondary | ICD-10-CM | POA: Diagnosis not present

## 2023-04-10 DIAGNOSIS — I4891 Unspecified atrial fibrillation: Secondary | ICD-10-CM | POA: Diagnosis not present

## 2023-04-10 DIAGNOSIS — K5641 Fecal impaction: Secondary | ICD-10-CM | POA: Diagnosis not present

## 2023-04-10 DIAGNOSIS — E039 Hypothyroidism, unspecified: Secondary | ICD-10-CM | POA: Diagnosis not present

## 2023-04-16 DIAGNOSIS — F0283 Dementia in other diseases classified elsewhere, unspecified severity, with mood disturbance: Secondary | ICD-10-CM | POA: Diagnosis not present

## 2023-04-16 DIAGNOSIS — E039 Hypothyroidism, unspecified: Secondary | ICD-10-CM | POA: Diagnosis not present

## 2023-04-16 DIAGNOSIS — M8008XD Age-related osteoporosis with current pathological fracture, vertebra(e), subsequent encounter for fracture with routine healing: Secondary | ICD-10-CM | POA: Diagnosis not present

## 2023-04-16 DIAGNOSIS — F32A Depression, unspecified: Secondary | ICD-10-CM | POA: Diagnosis not present

## 2023-04-16 DIAGNOSIS — I4891 Unspecified atrial fibrillation: Secondary | ICD-10-CM | POA: Diagnosis not present

## 2023-04-16 DIAGNOSIS — K5641 Fecal impaction: Secondary | ICD-10-CM | POA: Diagnosis not present

## 2023-04-24 ENCOUNTER — Other Ambulatory Visit: Payer: Self-pay | Admitting: Family Medicine

## 2023-04-24 DIAGNOSIS — I4891 Unspecified atrial fibrillation: Secondary | ICD-10-CM

## 2023-04-24 DIAGNOSIS — F419 Anxiety disorder, unspecified: Secondary | ICD-10-CM

## 2023-04-24 DIAGNOSIS — I10 Essential (primary) hypertension: Secondary | ICD-10-CM

## 2023-05-04 ENCOUNTER — Other Ambulatory Visit: Payer: Self-pay | Admitting: Family Medicine

## 2023-05-04 DIAGNOSIS — R441 Visual hallucinations: Secondary | ICD-10-CM

## 2023-05-11 ENCOUNTER — Other Ambulatory Visit: Payer: Self-pay | Admitting: Internal Medicine

## 2023-05-13 NOTE — Telephone Encounter (Signed)
Prescription refill request for Eliquis received. Indication:afib Last office visit:11/23 Scr:0.71  9/24 Age: 89 Weight:47.1  kg  Prescription refilled

## 2023-05-27 ENCOUNTER — Ambulatory Visit (INDEPENDENT_AMBULATORY_CARE_PROVIDER_SITE_OTHER): Payer: Medicare Other | Admitting: Family Medicine

## 2023-05-27 ENCOUNTER — Telehealth: Payer: Self-pay | Admitting: *Deleted

## 2023-05-27 ENCOUNTER — Encounter: Payer: Self-pay | Admitting: Family Medicine

## 2023-05-27 VITALS — BP 198/89 | HR 61 | Temp 97.7°F | Ht 61.0 in | Wt 105.6 lb

## 2023-05-27 DIAGNOSIS — I10 Essential (primary) hypertension: Secondary | ICD-10-CM | POA: Diagnosis not present

## 2023-05-27 DIAGNOSIS — R441 Visual hallucinations: Secondary | ICD-10-CM | POA: Diagnosis not present

## 2023-05-27 DIAGNOSIS — R300 Dysuria: Secondary | ICD-10-CM | POA: Diagnosis not present

## 2023-05-27 DIAGNOSIS — E039 Hypothyroidism, unspecified: Secondary | ICD-10-CM | POA: Diagnosis not present

## 2023-05-27 LAB — URINALYSIS, COMPLETE
Bilirubin, UA: NEGATIVE
Glucose, UA: NEGATIVE
Ketones, UA: NEGATIVE
Nitrite, UA: NEGATIVE
Protein,UA: NEGATIVE
RBC, UA: NEGATIVE
Specific Gravity, UA: 1.015 (ref 1.005–1.030)
Urobilinogen, Ur: 0.2 mg/dL (ref 0.2–1.0)
pH, UA: 5.5 (ref 5.0–7.5)

## 2023-05-27 LAB — MICROSCOPIC EXAMINATION: RBC, Urine: NONE SEEN /[HPF] (ref 0–2)

## 2023-05-27 MED ORDER — QUETIAPINE FUMARATE 50 MG PO TABS
75.0000 mg | ORAL_TABLET | Freq: Every day | ORAL | 2 refills | Status: DC
Start: 2023-05-27 — End: 2023-08-26

## 2023-05-27 MED ORDER — APIXABAN 2.5 MG PO TABS: 2.5000 mg | ORAL_TABLET | Freq: Two times a day (BID) | ORAL | 3 refills | Status: DC

## 2023-05-27 NOTE — Progress Notes (Unsigned)
Subjective:  Patient ID: JAHNAVI CISCO, female    DOB: 1934/05/23  Age: 89 y.o. MRN: [id number]  CC: Urinary Tract Infection   HPI BARRY AGOSTA presents for having bad dreams. Seems real. One night her room was ful of flowers. She gets rage, states [place], the caregiver who is with her. She gets upset and raises heck with all of [place]. Possibly her nerves. Ongoing for a few weeks. Wondering if UTI might be he cause.Thyroid in nml range on recent test.      05/27/2023    4:07 PM 05/27/2023    4:03 PM 04/01/2023   10:54 AM  Depression screen PHQ 2/9  Decreased Interest 1 0 1  Down, Depressed, Hopeless 0 0 0  PHQ - 2 Score 1 0 1  Altered sleeping 3  0  Tired, decreased energy 2  1  Change in appetite 3  1  Feeling bad or failure about yourself  0  0  Trouble concentrating 0  0  Moving slowly or fidgety/restless 0  0  Suicidal thoughts 0  0  PHQ-9 Score 9  3  Difficult doing work/chores Not difficult at all  Extremely dIfficult    History Sadina has a past medical history of Arthritis, Atrial fibrillation (HCC) (10/30/2016), Bilateral lower extremity edema (10/30/2016), Cancer (HCC) (03/28/2011), CAP (community acquired pneumonia) (10/04/2019), Dementia (HCC), Depression, Diverticulosis (02/2005), Esophageal stricture (03/03/2018), Generalized abdominal pain (07/17/2015), History of chemotherapy, History of external beam radiation therapy (08/20/2016), Hypertension, Hypothyroid, Osteoporosis, 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   She has a past surgical history that includes Direct laryngoscopy (03/27/2005); Tubal ligation; Total abdominal hysterectomy w/ bilateral salpingoophorectomy (1986); Cataract extraction, bilateral; and Cardioversion (N/A, 11/23/2016).   Her family history includes Alcohol abuse in her brother; COPD in her brother; Cancer (age of onset: 47) in her father; Heart disease (age of onset: 39) in her  mother; Stroke in her mother.She reports that she has never smoked. She has never used smokeless tobacco. She reports that she does not drink alcohol and does not use drugs.    ROS Review of Systems  Constitutional: Negative.   HENT: Negative.    Eyes:  Negative for visual disturbance.  Respiratory:  Negative for shortness of breath.   Cardiovascular:  Negative for chest pain.  Gastrointestinal:  Negative for abdominal pain.  Musculoskeletal:  Negative for arthralgias.  Psychiatric/Behavioral:  Positive for agitation, behavioral problems, hallucinations and sleep disturbance. The patient is nervous/anxious.     Objective:  BP (!) 198/89   Pulse 61   Temp 97.7 F (36.5 C)   Ht 5\' 1"  (1.549 m)   Wt 105 lb 9.6 oz (47.9 kg)   SpO2 96%   BMI 19.95 kg/m   BP Readings from Last 3 Encounters:  05/27/23 (!) 198/89  04/01/23 (!) 141/64  03/05/23 129/64    Wt Readings from Last 3 Encounters:  05/27/23 105 lb 9.6 oz (47.9 kg)  04/01/23 103 lb 12.8 oz (47.1 kg)  03/05/23 109 lb (49.4 kg)     Physical Exam Constitutional:      General: She is not in acute distress.    Appearance: She is well-developed.  Cardiovascular:     Rate and Rhythm: Normal rate and regular rhythm.  Pulmonary:     Breath sounds: Normal breath sounds.  Musculoskeletal:        General: Normal range of motion.  Skin:    General: Skin is warm  and dry.  Neurological:     Mental Status: She is alert and oriented to person, place, and time.     Motor: Weakness (nonfocal) present.  Psychiatric:        Mood and Affect: Mood normal.     Results for orders placed or performed in visit on 05/27/23  Microscopic Examination   Urine  Result Value Ref Range   WBC, UA 0-5 0 - 5 /hpf   RBC, Urine None seen 0 - 2 /hpf   Epithelial Cells (non renal) 0-10 0 - 10 /hpf   Bacteria, UA Few None seen/Few  Urinalysis, Complete  Result Value Ref Range   Specific Gravity, UA 1.015 1.005 - 1.030   pH, UA 5.5 5.0 -  7.5   Color, UA Yellow Yellow   Appearance Ur Clear Clear   Leukocytes,UA Trace (A) Negative   Protein,UA Negative Negative/Trace   Glucose, UA Negative Negative   Ketones, UA Negative Negative   RBC, UA Negative Negative   Bilirubin, UA Negative Negative   Urobilinogen, Ur 0.2 0.2 - 1.0 mg/dL   Nitrite, UA Negative Negative   Microscopic Examination See below:      Assessment & Plan:   Pansey was seen today for urinary tract infection.  Diagnoses and all orders for this visit:  Hallucinations, visual -     QUEtiapine (SEROQUEL) 50 MG tablet; Take 1.5 tablets (75 mg total) by mouth at bedtime.  Dysuria -     Urinalysis, Complete -     Urine Culture  Essential hypertension  Hypothyroidism, unspecified type  Other orders -     Microscopic Examination -     apixaban (ELIQUIS) 2.5 MG TABS tablet; Take 1 tablet (2.5 mg total) by mouth 2 (two) times daily.    Monitor BP closely at home since today's reading is far off from her home readings.   I have discontinued Rajni B. Chenault's solifenacin. I have changed her Eliquis to apixaban. I have also changed her QUEtiapine. Additionally, [name] maintain her acetaminophen, OVER THE COUNTER MEDICATION, cyanocobalamin, donepezil, estradiol, triamterene-hydrochlorothiazide, famotidine, Zinc Oxide, nystatin cream, potassium chloride, lidocaine, levothyroxine, pregabalin, escitalopram, and metoprolol succinate.  Allergies as of 05/27/2023       Reactions   Amlodipine Besylate Hives   Hives and confusion.   Tuberculin Tests Swelling   Nifedipine Swelling   Claritin [loratadine] Rash   Pt's daughter states she is allergic to claritin   Paroxetine Hcl Rash   Vit D-vit E-safflower Oil         Medication List        Accurate as of May 27, 2023 11:59 PM. If you have any questions, ask your nurse or doctor.          STOP taking these medications    solifenacin 5 MG tablet Commonly known as: VESICARE Stopped  by: Javad Salva       TAKE these medications    acetaminophen 325 MG tablet Commonly known as: TYLENOL Take 2 tablets (650 mg total) by mouth every 6 (six) hours as needed for mild pain, fever or headache.   apixaban 2.5 MG Tabs tablet Commonly known as: Eliquis Take 1 tablet (2.5 mg total) by mouth 2 (two) times daily.   cyanocobalamin 1000 MCG tablet Commonly known as: VITAMIN B12 Take 1,000 mcg by mouth daily.   donepezil 10 MG tablet Commonly known as: ARICEPT Take 10 mg by mouth every other day.   escitalopram 10 MG tablet Commonly known  as: LEXAPRO TAKE 1 TABLET BY MOUTH EVERY DAY   estradiol 0.1 MG/GM vaginal cream Commonly known as: ESTRACE VAGINAL Place 1 Applicatorful vaginally at bedtime.   famotidine 20 MG tablet Commonly known as: PEPCID Take 1 tablet (20 mg total) by mouth every 12 (twelve) hours.   levothyroxine 50 MCG tablet Commonly known as: SYNTHROID TAKE 1 TABLET BY MOUTH EVERY DAY   lidocaine 5 % Commonly known as: Lidoderm Place 1 patch onto the skin daily. Remove after 12 hours and leave it off 12 hours before applying a new one.   metoprolol succinate 100 MG 24 hr tablet Commonly known as: TOPROL-XL TAKE 1 TABLET DAILY WITH OR IMMEDIATELY FOLLOWING A MEAL.   nystatin cream Commonly known as: MYCOSTATIN Apply 1 Application topically 2 (two) times daily.   OVER THE COUNTER MEDICATION Vitamin b 12   potassium chloride 10 MEQ tablet Commonly known as: KLOR-CON Take 1 tablet (10 mEq total) by mouth daily.   pregabalin 75 MG capsule Commonly known as: Lyrica Take 1 capsule (75 mg total) by mouth at bedtime.   QUEtiapine 50 MG tablet Commonly known as: SEROQUEL Take 1.5 tablets (75 mg total) by mouth at bedtime. What changed: See the new instructions. Changed by: Michaelyn Wall   triamterene-hydrochlorothiazide 37.5-25 MG tablet Commonly known as: MAXZIDE-25 Take 1 tablet by mouth daily. For blood pressure and fluid   Zinc  Oxide 10 % Oint Apply to skin as needed.         Follow-up: Return in about 2 weeks (around 06/10/2023).  Mechele Claude, M.D.

## 2023-05-27 NOTE — Telephone Encounter (Signed)
 Copied from CRM [id number]. Topic: Appointments - Appointment Scheduling >> May 27, 2023 12:13 PM Tiffany H wrote: Patient's daughter Cristy Friedlander called to advise that patient has potentially lost her memory care provider. Patient's daughter Cristy Friedlander scheduled an appointment this afternoon with patient. Patient has fallen into rages and bad memory. Please assist.

## 2023-05-28 ENCOUNTER — Encounter: Payer: Self-pay | Admitting: Family Medicine

## 2023-05-29 LAB — URINE CULTURE

## 2023-06-12 ENCOUNTER — Ambulatory Visit: Payer: Self-pay | Admitting: Family Medicine

## 2023-06-12 DIAGNOSIS — L249 Irritant contact dermatitis, unspecified cause: Secondary | ICD-10-CM | POA: Diagnosis not present

## 2023-06-12 DIAGNOSIS — R030 Elevated blood-pressure reading, without diagnosis of hypertension: Secondary | ICD-10-CM | POA: Diagnosis not present

## 2023-06-12 NOTE — Telephone Encounter (Signed)
  Chief Complaint: Rash Symptoms: "red bumps on both arms" Frequency: one week Pertinent Negatives: Patient denies fever, CP, SOB Disposition: [] ED /[x] Urgent Care (no appt availability in office) / [] Appointment(In office/virtual)/ []  [facility]/ [] Home Care/ [] Refused Recommended Disposition /[] [facility]/ []  Follow-up with PCP Additional Notes: Daughter calling for patient who has developed red rash over both arms. Rash has been going on for a week. No itching or pain. Daughter is concerned that after trying OTC creams that the rash remains. No new medications. No appointment availability at PCP-recommended that the daughter bring her mother to be evaluated at their local urgent care. Daughter verbalizes understanding of the plan for urgent care and states her mother is known at their local UC. All questions answered.   Reason for Disposition  [1] Rash is painful to touch AND [2] fever  Answer Assessment - Initial Assessment Questions 1. APPEARANCE of RASH: "Describe the rash."      Small red rash 2. LOCATION: "Where is the rash located?"      Both arms 3. NUMBER: "How many spots are there?"      Unsure 4. SIZE: "How big are the spots?" (Inches, centimeters or compare to size of a coin)      "Bigger than a BB" 5. ONSET: "When did the rash start?"      All week 6. ITCHING: "Does the rash itch?" If Yes, ask: "How bad is the itch?"  (Scale 0-10; or none, mild, moderate, severe)     No itching 7. PAIN: "Does the rash hurt?" If Yes, ask: "How bad is the pain?"  (Scale 0-10; or none, mild, moderate, severe)    - NONE (0): no pain    - MILD (1-3): doesn't interfere with normal activities     - MODERATE (4-7): interferes with normal activities or awakens from sleep     - SEVERE (8-10): excruciating pain, unable to do any normal activities     No 8. OTHER SYMPTOMS: "Do you have any other symptoms?" (e.g., fever)     No fever, CP, SOB  Protocols used: Rash or  Redness - Localized-A-AH

## 2023-06-25 ENCOUNTER — Other Ambulatory Visit: Payer: Self-pay | Admitting: Family Medicine

## 2023-06-25 DIAGNOSIS — R1314 Dysphagia, pharyngoesophageal phase: Secondary | ICD-10-CM

## 2023-06-28 ENCOUNTER — Other Ambulatory Visit: Payer: Self-pay | Admitting: Family Medicine

## 2023-06-28 DIAGNOSIS — E039 Hypothyroidism, unspecified: Secondary | ICD-10-CM

## 2023-07-01 ENCOUNTER — Ambulatory Visit: Payer: Medicare Other | Attending: Internal Medicine | Admitting: Internal Medicine

## 2023-07-01 VITALS — BP 124/60 | HR 67 | Ht 60.0 in | Wt 102.0 lb

## 2023-07-01 DIAGNOSIS — I10 Essential (primary) hypertension: Secondary | ICD-10-CM | POA: Insufficient documentation

## 2023-07-01 DIAGNOSIS — I872 Venous insufficiency (chronic) (peripheral): Secondary | ICD-10-CM | POA: Insufficient documentation

## 2023-07-01 DIAGNOSIS — Z7901 Long term (current) use of anticoagulants: Secondary | ICD-10-CM | POA: Insufficient documentation

## 2023-07-01 DIAGNOSIS — I4821 Permanent atrial fibrillation: Secondary | ICD-10-CM | POA: Insufficient documentation

## 2023-07-01 DIAGNOSIS — R600 Localized edema: Secondary | ICD-10-CM | POA: Diagnosis not present

## 2023-07-01 NOTE — Patient Instructions (Signed)
Medication Instructions:  NO CHANGES  *If you need a refill on your cardiac medications before your next appointment, please call your pharmacy*   Follow-Up: At [facility], you and your health needs are our priority.  As part of our continuing mission to provide you with exceptional heart care, we have created designated Provider Care Teams.  These Care Teams include your primary Cardiologist (physician) and Advanced Practice Providers (APPs -  Physician Assistants and Nurse Practitioners) who all work together to provide you with the care you need, when you need it.  We recommend signing up for the patient portal called "MyChart".  Sign up information is provided on this After Visit Summary.  MyChart is used to connect with patients for Virtual Visits (Telemedicine).  Patients are able to view lab/test results, encounter notes, upcoming appointments, etc.  Non-urgent messages can be sent to your provider as well.   To learn more about what you can do with MyChart, go to [email or web address].    Your next appointment:    12 months with Dr. Hilty  

## 2023-07-01 NOTE — Progress Notes (Signed)
OFFICE CONSULT NOTE  Chief Complaint:  No complaints  Primary Care Physician: Mechele Claude, MD  HPI:  Crystal Browning is a 89 y.o. female who is being seen today for the evaluation of atrial fibrillation at the request of Mechele Claude, MD. Crystal Browning was recently seen for symptoms of palpitations and shortness of breath which was mostly noted by her daughter. She said that they routinely go shopping at [facility] and that while walking through the building she became more short of breath easier. It does not seem that Crystal Browning typically complaints about any worsening shortness of breath or symptoms but it was clear to her daughter that she was struggling somewhat to get around. She noted that her pulse was irregular and when she was seen in her primary care doctor's office she was found to be in atrial fibrillation. There is no history of A. fib in the family or personal history although she did have possibly a mild MI after the death of her husband, which sounds like it could've been a stress-induced cardiomyopathic event. She was a ready on metoprolol succinate 50 mg daily and that was increased up to 100 mg daily for rate control and started on Xarelto. Unfortunate she was given the Xarelto DBT dose pack which is 50 mg twice a day and then switching to 20 mg daily. The appropriate dose for her would've been 20 mg daily from the beginning. So far she has not had any bleeding problems. She is also on low-dose aspirin. There is apparently history of diverticulum.  12/14/2016  Crystal Browning returns today for follow-up of her cardioversion. This is performed by myself on 11/23/2016. She was cardioverted once at 150 J successfully to sinus rhythm. Per her daughter's report she had a partial choking event which was related to x-ray therapy in the past 2 days after her cardioversion. EMS was called and she was noted to be in sinus rhythm at the time. She says she's felt very good since then. She continues to feel  well today. Unfortunately, her EKG today shows she is back in atrial fibrillation although rate controlled in the 70s. Based on this finding I think that she is actually somewhat asymptomatic with the A. fib and therefore I would not recommend pursuing a rhythm control strategy in her. We also discussed anticoagulation. Her daughter's concerned about possibly higher bleeding risk on Xarelto and is interested in switching her to Eliquis.  06/14/2017  Crystal Browning returns today for follow-up.  She is done well without any recurrent A. fib.  She occasionally gets some dizziness but I do not feel that this is the A. fib.  EKG shows sinus rhythm today.  She denies any bleeding problems on Eliquis.  Recently labs were done in August by her primary care provider which showed normal renal function and stable hemoglobin and hematocrit.  Blood pressure is at goal today.  06/24/2018  Crystal Browning is seen today in follow-up.  She seems to be back in A. fib today.  Apparently recently she was seen in her PCPs office and was in sinus rhythm.  This suggests her A. fib is paroxysmal.  She is tolerating Eliquis without any bleeding problems.  Her blood pressure is noted to be high today at 180/84 however recheck came down to 145 systolic.  Her PCP has noted labile blood pressures in the past and apparently she was placed on some additional blood pressure medicine which "bottomed her out" according to her daughter.  Labs from June 11, 2018 showed total cholesterol 166, HDL 41, LDL 97 and triglycerides 141.  07/01/2019  Crystal Browning returns today in follow-up.  She is accompanied by her daughter.  Blood pressure is again elevated in the office today however at home she is certain it runs around 130 systolic.  She denies any chest pain or worsening shortness of breath.  She is back in A. fib today with controlled ventricular response at 74, but is asymptomatic with this.  She is anticoagulated on Eliquis without bleeding  issues.  06/20/2020  Crystal Browning is seen today in follow-up. Her daughter is with her again today. Her PCP apparently started her back on some low-dose amlodipine. Her daughter was concerned because she had side effects with this including hives and has had recent issues with memory that seem to be worse. There is a suggestion of labile hypertension and at times her blood pressure was low at home. Today the blood pressure is high in the office at 174/68. She is in A. fib today but rate controlled. No difficulties with anticoagulation has been reported. She denies any falls. She had labs in September showing total cholesterol 166, HDL 54, LDL 89 and triglycerides 131.  05/31/2022  Crystal Browning is seen today in follow-up with her daughter.  She continues to do well.  Her home blood pressure readings generally run in the 130 systolic range.  Her office readings however are usually quite elevated.  Her PCP has gotten readings in the 200s.  Her systolic blood pressure today was 196.  The home cuff appears to be calibrated and has been used by other family members which seem to correlate with office readings.  I think she has some significant whitecoat hypertension.  She has not had any recent falls.  She is in A-fib which is persistent.  She has been on lower dose Eliquis however her weight is remained below 130 pounds which is less than 60 kg.  Based on this and her age over 22, she will qualify for Eliquis 2.5 mg twice daily.  07/01/2023  Crystal Browning is seen today in follow-up with her daughter.  She continues to do well.  Unfortunately she has had some progressive dementia.  She is in A-fib but she is rate controlled.  She is on low-dose Eliquis which is appropriate for her age over 45 and weight less than 60 kg.  She has had no bleeding issues.  She has had no falls recently.  She has had some lower extremity edema which is attributable to her venous insufficiency and neuropathy.  She had an echo number years  back in 2018 which showed normal LVEF.  She has not had any worsening shortness of breath or chest pain.  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 Pollyann Kennedy   TOTAL ABDOMINAL HYSTERECTOMY W/ BILATERAL SALPINGOOPHORECTOMY  1986   TUBAL LIGATION      FAMHx:  Family History  Problem Relation Age of Onset   Stroke Mother    Heart disease Mother 50   Cancer Father 11       Prostate   Alcohol abuse Brother    COPD Brother     SOCHx:   reports that she has never smoked. She has never used smokeless tobacco. She reports that she does not drink alcohol and does not use drugs.  ALLERGIES:  Allergies  Allergen Reactions    Amlodipine Besylate Hives    Hives and confusion.   Tuberculin Tests Swelling   Nifedipine Swelling   Claritin [Loratadine] Rash    Pt's daughter states she is allergic to claritin   Paroxetine Hcl Rash   Vit D-Vit E-Safflower Oil     ROS: Pertinent items noted in HPI and remainder of comprehensive ROS otherwise negative.  HOME MEDS: Current Outpatient Medications on File Prior to Visit  Medication Sig Dispense Refill   acetaminophen (TYLENOL) 325 MG tablet Take 2 tablets (650 mg total) by mouth every 6 (six) hours as needed for mild pain, fever or headache. 12 tablet 0   apixaban (ELIQUIS) 2.5 MG TABS tablet Take 1 tablet (2.5 mg total) by mouth 2 (two) times daily. 180 tablet 3   donepezil (ARICEPT) 10 MG tablet Take 10 mg by mouth every other day.     escitalopram (LEXAPRO) 10 MG tablet TAKE 1 TABLET BY MOUTH EVERY DAY 90 tablet 0   estradiol (ESTRACE VAGINAL) 0.1 MG/GM vaginal cream Place 1 Applicatorful vaginally at bedtime. 42.5 g 12   famotidine (PEPCID) 20 MG tablet TAKE 1 TABLET BY MOUTH EVERY 12 HOURS. 180 tablet 3   levothyroxine (SYNTHROID) 50 MCG tablet TAKE 1 TABLET BY MOUTH EVERY DAY 90 tablet 2   lidocaine (LIDODERM) 5 % Place 1 patch onto the skin daily. Remove after 12 hours and leave it off 12 hours before applying a new one. 30 patch 2   metoprolol succinate (TOPROL-XL) 100 MG 24 hr tablet TAKE 1 TABLET DAILY WITH OR IMMEDIATELY FOLLOWING A MEAL. 90 tablet 1   OVER THE COUNTER MEDICATION Vitamin b 12     potassium chloride (KLOR-CON) 10 MEQ tablet Take 1 tablet (10 mEq total) by mouth daily. 3 tablet 0   pregabalin (LYRICA) 75 MG capsule Take 1 capsule (75 mg total) by mouth at bedtime. 90 capsule 1   QUEtiapine (SEROQUEL) 50 MG tablet Take 1.5 tablets (75 mg total) by mouth at bedtime. 45 tablet 2   triamterene-hydrochlorothiazide (MAXZIDE-25) 37.5-25 MG tablet Take 1 tablet by mouth daily. For blood pressure and fluid 90 tablet 3   vitamin B-12 (CYANOCOBALAMIN)  1000 MCG tablet Take 1,000 mcg by mouth daily.     Zinc  Oxide 10 % OINT Apply to skin as needed. 226.8 g 1   nystatin cream (MYCOSTATIN) Apply 1 Application topically 2 (two) times daily. (Patient not taking: Reported on 07/01/2023) 30 g 0   No current facility-administered medications on file prior to visit.    LABS/IMAGING: No results found for this or any previous visit (from the past 48 hours). No results found.  WEIGHTS: Wt Readings from Last 3 Encounters:  07/01/23 102 lb (46.3 kg)  05/27/23 105 lb 9.6 oz (47.9 kg)  04/01/23 103 lb 12.8 oz (47.1 kg)    VITALS: BP 124/60 (BP Location: Right Arm, Patient Position: Sitting, Cuff Size: Normal)   Pulse 67   Ht 5' (1.524 m)   Wt 102 lb (46.3 kg)   SpO2 97%   BMI 19.92 kg/m   EXAM: General appearance: alert and no distress Neck: no carotid bruit and no JVD Lungs: clear to auscultation bilaterally Heart: regular rate and rhythm, S1, S2 normal, no murmur, click, rub or gallop Abdomen: soft, non-tender; bowel sounds normal; no masses,  no organomegaly Extremities: extremities normal, atraumatic, no cyanosis or edema, edema 1+ bilateral lower extremity, and varicose veins noted Pulses: 2+ and symmetric Skin: Skin color, texture, turgor normal. No rashes or lesions Neurologic: Grossly normal Psych: Pleasant  EKG: EKG Interpretation Date/Time:  Monday July 01 2023 13:38:47 EST Ventricular Rate:  70 PR Interval:    QRS Duration:  94 QT Interval:  424 QTC Calculation: 457 R Axis:   -44  Text Interpretation: Atrial fibrillation Left axis deviation Incomplete right bundle branch block Moderate voltage criteria for LVH, may be normal variant ( R in aVL , Cornell product ) When compared with ECG of 03-Feb-2023 13:41, Incomplete right bundle branch block has replaced Right bundle branch block Confirmed by Zoila Shutter [phone]) on 07/01/2023 2:01:00 PM    ASSESSMENT: Persistent atrial fibrillation-successful DCCV, however  there was recurrence CHADSVASC score of 4-on Eliquis Hypertension Venous insufficiency  PLAN: 1.   Crystal Browning continues to do well.  She does have some venous insufficiency and some lower extremity edema.  This may be related to that and neuropathy.  Low suspicion for heart failure.  Her last echo was in 2018.  She denies any shortness of breath, orthopnea or other heart failure symptoms.  She remains in rate controlled A-fib.  She is asymptomatic with this.  She is on appropriate dose Eliquis.  Blood pressure is well-controlled.  No changes to her meds today.  She is advised to use compression stockings if she is able to get them on and elevate her feet.  She does note that the swelling improves at night when she elevates her feet.  Follow-up annually or sooner as necessary.  Chrystie Nose, MD, [facility], FACP  [place]  [facility]  Medical Director of the [facility]  [facility] Attending Cardiologist  Direct Dial: [phone]  Fax: [phone]  Website:  [email or web address] Nicely Jahquan Klugh 07/01/2023, 2:01 PM

## 2023-07-23 ENCOUNTER — Other Ambulatory Visit: Payer: Self-pay | Admitting: Family Medicine

## 2023-07-23 DIAGNOSIS — F419 Anxiety disorder, unspecified: Secondary | ICD-10-CM

## 2023-08-24 ENCOUNTER — Other Ambulatory Visit: Payer: Self-pay | Admitting: Family Medicine

## 2023-08-24 DIAGNOSIS — R441 Visual hallucinations: Secondary | ICD-10-CM

## 2023-09-10 ENCOUNTER — Ambulatory Visit (INDEPENDENT_AMBULATORY_CARE_PROVIDER_SITE_OTHER): Payer: Medicare Other

## 2023-09-10 VITALS — Ht 60.0 in | Wt 102.0 lb

## 2023-09-10 DIAGNOSIS — Z0000 Encounter for general adult medical examination without abnormal findings: Secondary | ICD-10-CM | POA: Diagnosis not present

## 2023-09-10 NOTE — Patient Instructions (Signed)
 Ms. Dejonge , Thank you for taking time to come for your Medicare Wellness Visit. I appreciate your ongoing commitment to your health goals. Please review the following plan we discussed and let me know if I can assist you in the future.   Referrals/Orders/Follow-Ups/Clinician Recommendations: Aim for 30 minutes of exercise or brisk walking, 6-8 glasses of water, and 5 servings of fruits and vegetables each day.  This is a list of the screening recommended for you and due dates:  Health Maintenance  Topic Date Due   COVID-19 Vaccine (1) Never done   DTaP/Tdap/Td vaccine (1 - Tdap) Never done   Zoster (Shingles) Vaccine (1 of 2) Never done   Pneumonia Vaccine (2 of 2 - PCV) 03/31/2024*   Medicare Annual Wellness Visit  09/09/2024   Flu Shot  Completed   DEXA scan (bone density measurement)  Completed   HPV Vaccine  Aged Out  *Topic was postponed. The date shown is not the original due date.    Advanced directives: (ACP Link)Information on Advanced Care Planning can be found at [facility] Advance Health Care Directives Advance Health Care Directives ([email or web address]   Next Medicare Annual Wellness Visit scheduled for next year: Yes

## 2023-09-10 NOTE — Progress Notes (Signed)
 Subjective:   Crystal Browning is a 89 y.o. who presents for a Medicare Wellness preventive visit.  Visit Complete: Virtual I connected with  Crystal Browning on 09/10/23 by a audio enabled telemedicine application and verified that I am speaking with the correct person using two identifiers.  Patient Location: Home  Provider Location: Home Office  I discussed the limitations of evaluation and management by telemedicine. The patient expressed understanding and agreed to proceed.  Vital Signs: Because this visit was a virtual/telehealth visit, some criteria may be missing or patient reported. Any vitals not documented were not able to be obtained and vitals that have been documented are patient reported.  VideoDeclined- This patient declined Librarian, academic. Therefore the visit was completed with audio only.  AWV Questionnaire: No: Patient Medicare AWV questionnaire was not completed prior to this visit.  Cardiac Risk Factors include: advanced age (>26men, >63 women);dyslipidemia;hypertension;sedentary lifestyle     Objective:    Today's Vitals   09/10/23 0856  Weight: 102 lb (46.3 kg)  Height: 5' (1.524 m)   Body mass index is 19.92 kg/m.     09/10/2023   10:59 AM 02/24/2023   12:00 PM 02/17/2023    9:41 AM 02/03/2023    1:39 PM 11/13/2022    6:29 PM 09/05/2022    2:43 PM 09/21/2021    6:08 PM  Advanced Directives  Does Patient Have a Medical Advance Directive? No No No No No No No  Would patient like information on creating a medical advance directive? Yes (MAU/Ambulatory/Procedural Areas - Information given) No - Patient declined    No - Patient declined No - Patient declined    Current Medications (verified) Outpatient Encounter Medications as of 09/10/2023  Medication Sig   acetaminophen (TYLENOL) 325 MG tablet Take 2 tablets (650 mg total) by mouth every 6 (six) hours as needed for mild pain, fever or headache.   apixaban (ELIQUIS) 2.5 MG TABS  tablet Take 1 tablet (2.5 mg total) by mouth 2 (two) times daily.   donepezil (ARICEPT) 10 MG tablet Take 10 mg by mouth every other day.   escitalopram (LEXAPRO) 10 MG tablet TAKE 1 TABLET BY MOUTH EVERY DAY   estradiol (ESTRACE VAGINAL) 0.1 MG/GM vaginal cream Place 1 Applicatorful vaginally at bedtime.   famotidine (PEPCID) 20 MG tablet TAKE 1 TABLET BY MOUTH EVERY 12 HOURS.   levothyroxine (SYNTHROID) 50 MCG tablet TAKE 1 TABLET BY MOUTH EVERY DAY   lidocaine (LIDODERM) 5 % Place 1 patch onto the skin daily. Remove after 12 hours and leave it off 12 hours before applying a new one.   metoprolol succinate (TOPROL-XL) 100 MG 24 hr tablet TAKE 1 TABLET DAILY WITH OR IMMEDIATELY FOLLOWING A MEAL.   OVER THE COUNTER MEDICATION Vitamin b 12   potassium chloride (KLOR-CON) 10 MEQ tablet Take 1 tablet (10 mEq total) by mouth daily.   pregabalin (LYRICA) 75 MG capsule Take 1 capsule (75 mg total) by mouth at bedtime.   QUEtiapine (SEROQUEL) 50 MG tablet TAKE 1.5 TABLETS BY MOUTH AT BEDTIME.   triamterene-hydrochlorothiazide (MAXZIDE-25) 37.5-25 MG tablet Take 1 tablet by mouth daily. For blood pressure and fluid   vitamin B-12 (CYANOCOBALAMIN) 1000 MCG tablet Take 1,000 mcg by mouth daily.   Zinc Oxide 10 % OINT Apply to skin as needed.   nystatin cream (MYCOSTATIN) Apply 1 Application topically 2 (two) times daily. (Patient not taking: Reported on 09/10/2023)   No facility-administered encounter medications on file as  of 09/10/2023.    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 Pollyann Kennedy   TOTAL ABDOMINAL HYSTERECTOMY W/ BILATERAL SALPINGOOPHORECTOMY  1986   TUBAL LIGATION     Family History  Problem Relation Age of Onset   Stroke Mother    Heart disease Mother 78   Cancer Father 71       Prostate   Alcohol abuse Brother    COPD Brother    Social History   Socioeconomic History   Marital status: Widowed    Spouse name: Not on file   Number of children: 6   Years of education: Not on file   Highest education level: Not on file  Occupational History   Not on file  Tobacco Use   Smoking status: Never   Smokeless tobacco: Never  Vaping Use   Vaping status: Never Used  Substance and Sexual Activity   Alcohol use: No   Drug use: No   Sexual activity: Not on file  Other Topics Concern   Not on file  Social History Narrative   Lives alone in her mobile home. Children live nearby - they take her to appts and help out as needed.   Social Drivers of Manufacturing engineer Strain: Low Risk  (09/10/2023)   Overall Financial Resource Strain (CARDIA)    Difficulty of Paying Living Expenses: Not hard at all  Food Insecurity: No Food Insecurity (09/10/2023)   Hunger Vital Sign    Worried About Running Out of Food in the Last Year: Never true    Ran Out of Food in the Last Year: Never true  Transportation Needs: No Transportation Needs (09/10/2023)   PRAPARE - Administrator, Civil Service (Medical): No    Lack of Transportation (Non-Medical): No  Physical Activity: Insufficiently Active (09/10/2023)   Exercise Vital Sign    Days of Exercise per Week: 3 days    Minutes of Exercise per Session: 10 min  Stress: No Stress Concern Present (09/10/2023)   [employer]  of Occupational Health - Occupational Stress Questionnaire    Feeling of Stress : Not at all  Social Connections: Socially Isolated (09/10/2023)   Social Connection and Isolation Panel [NHANES]    Frequency of Communication with Friends and Family: More than three times a week    Frequency of Social Gatherings with Friends and Family: Three times a week    Attends Religious Services: Never    Active Member of Clubs or Organizations: No    Attends Banker Meetings: Never    Marital Status: Widowed    Tobacco Counseling Counseling given: Not Answered    Clinical Intake:  Pre-visit preparation completed: Yes  Pain : No/denies pain     Diabetes: No  How often do you need to have someone help you when you read instructions, pamphlets, or other written materials from your doctor or pharmacy?: 1 - Never  Interpreter Needed?: No  Information entered by :: Kandis Fantasia LPN   Activities of Daily Living     09/10/2023   10:59 AM  In your present state of health, do you have any difficulty performing the following activities:  Hearing? 0  Vision? 0  Difficulty concentrating or making decisions? 0  Walking or climbing stairs? 1  Dressing or  bathing? 0  Doing errands, shopping? 0  Preparing Food and eating ? N  Using the Toilet? N  In the past six months, have you accidently leaked urine? N  Do you have problems with loss of bowel control? N  Managing your Medications? Y  Managing your Finances? Y  Housekeeping or managing your Housekeeping? N    Patient Care Team: Mechele Claude, MD as PCP - General (Family Medicine)  Indicate any recent Medical Services you may have received from other than [facility] providers in the past year (date may be approximate).     Assessment:   This is a routine wellness examination for Vonya.  Hearing/Vision screen Hearing Screening - Comments:: Hard of hearing; no hearing aids  Vision Screening - Comments:: Wears rx glasses - up to date with routine eye exams with [facility]. Wyn Forster     Goals Addressed             This Visit's Progress    Prevent falls         Depression Screen     09/10/2023   10:57 AM 05/27/2023    4:07 PM 05/27/2023    4:03 PM 04/01/2023   10:54 AM 04/01/2023   10:42 AM 03/05/2023   10:40 AM 01/09/2023    2:17 PM  PHQ 2/9 Scores  PHQ - 2 Score 1 1 0 1 0 0 0  PHQ- 9 Score 7 9  3    0    Fall Risk     09/10/2023   10:58 AM 05/27/2023    4:07 PM 05/27/2023    4:03 PM 04/01/2023   10:54 AM 04/01/2023   10:42 AM  Fall Risk   Falls in the past year? 0 1 1 1  0  Number falls in past yr: 0 1  1   Injury with Fall? 0 1 1 1    Risk for fall due to : Impaired balance/gait;Impaired mobility History of fall(s);Impaired balance/gait;Impaired mobility History of fall(s);Impaired balance/gait;Impaired mobility History of fall(s);Impaired balance/gait;Impaired mobility   Follow up Falls prevention discussed;Education provided;Falls evaluation completed Falls evaluation completed Falls evaluation completed Falls evaluation completed     MEDICARE RISK AT HOME:  Medicare Risk at Home Any stairs in or around  the home?: No If so, are there any without handrails?: No Home  free of loose throw rugs in walkways, pet beds, electrical cords, etc?: Yes Adequate lighting in your home to reduce risk of falls?: Yes Life alert?: No Use of a cane, walker or w/c?: Yes Grab bars in the bathroom?: Yes Shower chair or bench in shower?: No Elevated toilet seat or a handicapped toilet?: Yes  TIMED UP AND GO:  Was the test performed?  No  Cognitive Function: Patient has current diagnosis of cognitive impairment.     02/27/2021   11:56 AM 12/07/2020    2:39 PM 06/29/2020   10:34 AM  MMSE - Mini Mental State Exam  Orientation to time 4 5 4   Orientation to Place 0 2 4  Registration 3 3 3   Attention/ Calculation 4 0 2  Recall 1 1 3   Language- name 2 objects 2 2 2   Language- repeat 1 1 0  Language- follow 3 step command 3 2 3   Language- read & follow direction 0 1 1  Write a sentence 1 0 0  Copy design 0 0 0  Total score 19 17 22         09/10/2023   10:59 AM 09/05/2022    2:44 PM  6CIT Screen  What Year? 0 points 0 points  What month? 0 points 0 points  What time? 3 points 3 points  Count back from 20 2 points 2 points  Months in reverse 2 points 2 points  Repeat phrase 2 points 4 points  Total Score 9 points 11 points    Immunizations Immunization History  Administered Date(s) Administered   Fluad Quad(high Dose 65+) 10/05/2019, 06/02/2020, 06/28/2021   Fluad Trivalent(High Dose 65+) 04/01/2023   Influenza Inj Mdck Quad Pf 10/05/2019   Influenza Split 05/27/2006   Influenza, High Dose Seasonal PF 06/11/2018   Pneumococcal Polysaccharide-23 10/05/2019    Screening Tests Health Maintenance  Topic Date Due   COVID-19 Vaccine (1) Never done   DTaP/Tdap/Td (1 - Tdap) Never done   Zoster Vaccines- Shingrix (1 of 2) Never done   Pneumonia Vaccine 87+ Years old (2 of 2 - PCV) 03/31/2024 (Originally 10/04/2020)   Medicare Annual Wellness (AWV)  09/09/2024   INFLUENZA VACCINE  Completed   DEXA SCAN  Completed   HPV VACCINES  Aged Out    Health  Maintenance  Health Maintenance Due  Topic Date Due   COVID-19 Vaccine (1) Never done   DTaP/Tdap/Td (1 - Tdap) Never done   Zoster Vaccines- Shingrix (1 of 2) Never done   Health Maintenance Items Addressed: Declines vaccines   Additional Screening:  Vision Screening: Recommended annual ophthalmology exams for early detection of glaucoma and other disorders of the eye.  Dental Screening: Recommended annual dental exams for proper oral hygiene  Community Resource Referral / Chronic Care Management: CRR required this visit?  No   CCM required this visit?  No     Plan:     I have personally reviewed and noted the following in the patient's chart:   Medical and social history Use of alcohol, tobacco or illicit drugs  Current medications and supplements including opioid prescriptions. Patient is not currently taking opioid prescriptions. Functional ability and status Nutritional status Physical activity Advanced directives List of other physicians Hospitalizations, surgeries, and ER visits in previous 12 months Vitals Screenings to include cognitive, depression, and falls Referrals and appointments  In addition, I have reviewed and discussed with patient certain preventive protocols,  quality metrics, and best practice recommendations. A written personalized care plan for preventive services as well as general preventive health recommendations were provided to patient.     Kandis Fantasia [place], [place]   10/06/4008   After Visit Summary: (Declined) Due to this being a telephonic visit, with patients personalized plan was offered to patient but patient Declined AVS at this time   Notes: Nothing significant to report at this time.

## 2023-09-16 DIAGNOSIS — R069 Unspecified abnormalities of breathing: Secondary | ICD-10-CM | POA: Diagnosis not present

## 2023-10-18 ENCOUNTER — Other Ambulatory Visit: Payer: Self-pay | Admitting: Family Medicine

## 2023-10-18 DIAGNOSIS — F419 Anxiety disorder, unspecified: Secondary | ICD-10-CM

## 2023-10-22 ENCOUNTER — Other Ambulatory Visit: Payer: Self-pay | Admitting: Family Medicine

## 2023-10-22 DIAGNOSIS — I4891 Unspecified atrial fibrillation: Secondary | ICD-10-CM

## 2023-10-22 DIAGNOSIS — I10 Essential (primary) hypertension: Secondary | ICD-10-CM

## 2023-11-11 ENCOUNTER — Other Ambulatory Visit: Payer: Self-pay | Admitting: Family Medicine

## 2023-11-11 DIAGNOSIS — F419 Anxiety disorder, unspecified: Secondary | ICD-10-CM

## 2023-11-11 NOTE — Telephone Encounter (Signed)
 Appt made for 11/13/2023

## 2023-11-11 NOTE — Telephone Encounter (Signed)
 Stacks pt NTBS 30-d given 10/18/23

## 2023-11-13 ENCOUNTER — Other Ambulatory Visit: Payer: Self-pay | Admitting: Family Medicine

## 2023-11-13 ENCOUNTER — Ambulatory Visit (INDEPENDENT_AMBULATORY_CARE_PROVIDER_SITE_OTHER): Admitting: Family Medicine

## 2023-11-13 ENCOUNTER — Encounter: Payer: Self-pay | Admitting: Family Medicine

## 2023-11-13 DIAGNOSIS — I10 Essential (primary) hypertension: Secondary | ICD-10-CM

## 2023-11-13 DIAGNOSIS — I4891 Unspecified atrial fibrillation: Secondary | ICD-10-CM

## 2023-11-13 DIAGNOSIS — F419 Anxiety disorder, unspecified: Secondary | ICD-10-CM

## 2023-11-13 DIAGNOSIS — R1314 Dysphagia, pharyngoesophageal phase: Secondary | ICD-10-CM | POA: Diagnosis not present

## 2023-11-13 DIAGNOSIS — E039 Hypothyroidism, unspecified: Secondary | ICD-10-CM

## 2023-11-13 DIAGNOSIS — R441 Visual hallucinations: Secondary | ICD-10-CM

## 2023-11-13 LAB — LIPID PANEL

## 2023-11-13 MED ORDER — LEVOTHYROXINE SODIUM 50 MCG PO TABS
50.0000 ug | ORAL_TABLET | Freq: Every day | ORAL | 2 refills | Status: AC
Start: 2023-11-13 — End: ?

## 2023-11-13 MED ORDER — ESCITALOPRAM OXALATE 10 MG PO TABS
10.0000 mg | ORAL_TABLET | Freq: Every day | ORAL | 0 refills | Status: DC
Start: 2023-11-13 — End: 2024-03-31

## 2023-11-13 MED ORDER — QUETIAPINE FUMARATE 50 MG PO TABS
75.0000 mg | ORAL_TABLET | Freq: Every day | ORAL | 0 refills | Status: DC
Start: 2023-11-13 — End: 2024-04-22

## 2023-11-13 MED ORDER — FAMOTIDINE 20 MG PO TABS
20.0000 mg | ORAL_TABLET | Freq: Two times a day (BID) | ORAL | 3 refills | Status: AC
Start: 2023-11-13 — End: ?

## 2023-11-13 MED ORDER — TRIAMTERENE-HCTZ 37.5-25 MG PO TABS: 1.0000 | ORAL_TABLET | Freq: Every day | ORAL | 3 refills | Status: DC

## 2023-11-13 MED ORDER — METOPROLOL SUCCINATE ER 100 MG PO TB24
ORAL_TABLET | ORAL | 0 refills | Status: DC
Start: 2023-11-13 — End: 2024-02-10

## 2023-11-13 NOTE — Progress Notes (Signed)
 Subjective:  Patient ID: Crystal Browning, female    DOB: [birth year]  Age: 89 y.o. MRN: [id number]  CC: Medication Refill (Medication pended), Back Pain (Ongoing ), and urine issue (Not able to hold urine sometimes)   HPI [name] presents for follow-up on her dementia and chronic back pain mostly at the upper back where she has dorsal kyphosis.  This has gotten much better.  She has a history of compression fracture.  Patient presents for follow-up on  thyroid . The patient has a history of hypothyroidism for many years. It has been stable recently. Pt. denies any change in  voice, loss of hair, heat or cold intolerance. Energy level has been adequate to good. Patient denies constipation and diarrhea. No myxedema. Medication is as noted below. Verified that pt is taking it daily on an empty stomach. Well tolerated. In the past the dementia has led to multiple hallucinations that have been suppressed by her quetiapine .  Family and she are happy with her dementia control at this time.  Patient in for follow-up of atrial fibrillation. Patient denies any recent bouts of chest pain or palpitations. Additionally, patient is taking anticoagulants. Patient denies any recent excessive bleeding episodes including epistaxis, bleeding from the gums, genitalia, rectal bleeding or hematuria. Additionally there has been no excessive bruising.   Follow-up of hypertension. Patient has no history of headache chest pain or shortness of breath or recent cough. Patient also denies symptoms of TIA such as numbness weakness lateralizing. Patient checks  blood pressure at home and has not had any elevated readings recently. Patient denies side effects from his medication. States taking it regularly.  She has had reflux with dysphagia adequately treated with Pepcid  currently.      11/13/2023    1:43 PM 09/10/2023   10:57 AM 05/27/2023    4:07 PM  Depression screen PHQ 2/9  Decreased Interest 0 1 1  Down, Depressed,  Hopeless 0 0 0  PHQ - 2 Score 0 1 1  Altered sleeping 2 2 3   Tired, decreased energy 1 2 2   Change in appetite 0 2 3  Feeling bad or failure about yourself  0 0 0  Trouble concentrating 0 0 0  Moving slowly or fidgety/restless 0 0 0  Suicidal thoughts 0 0 0  PHQ-9 Score 3 7 9   Difficult doing work/chores Not difficult at all  Not difficult at all    History Roxsana has a past medical history of Arthritis, Atrial fibrillation (HCC) (10/30/2016), Bilateral lower extremity edema (10/30/2016), Cancer (HCC) (03/28/2011), CAP (community acquired pneumonia) (10/04/2019), Dementia (HCC), Depression, Diverticulosis (02/2005), Esophageal stricture (03/03/2018), Generalized abdominal pain (07/17/2015), History of chemotherapy, History of external beam radiation therapy (08/20/2016), Hypertension, Hypothyroid, Osteoporosis, 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   She has a past surgical history that includes Direct laryngoscopy (03/27/2005); Tubal ligation; Total abdominal hysterectomy w/ bilateral salpingoophorectomy (1986); Cataract extraction, bilateral; and Cardioversion (N/A, 11/23/2016).   Her family history includes Alcohol abuse in her brother; COPD in her brother; Cancer (age of onset: 36) in her father; Heart disease (age of onset: 10) in her mother; Stroke in her mother.She reports that she has never smoked. She has never used smokeless tobacco. She reports that she does not drink alcohol and does not use drugs.    ROS Review of Systems  Constitutional: Negative.   HENT: Negative.    Eyes:  Negative for visual disturbance.  Respiratory:  Negative for  shortness of breath.   Cardiovascular:  Negative for chest pain.  Gastrointestinal:  Negative for abdominal pain.  Musculoskeletal:  Positive for back pain (due to dorsal kyphosis) and gait problem (walks usding a walker). Negative for arthralgias.    Objective:  BP (!)  158/84   Pulse (!) 58   Temp 98.2 F (36.8 C)   Ht 5' (1.524 m)   SpO2 95%   BMI 19.92 kg/m   BP Readings from Last 3 Encounters:  11/13/23 (!) 158/84  07/01/23 124/60  05/27/23 (!) 198/89    Wt Readings from Last 3 Encounters:  09/10/23 102 lb (46.3 kg)  07/01/23 102 lb (46.3 kg)  05/27/23 105 lb 9.6 oz (47.9 kg)     Physical Exam Constitutional:      General: She is not in acute distress.    Appearance: She is well-developed.  Cardiovascular:     Rate and Rhythm: Normal rate and regular rhythm.  Pulmonary:     Breath sounds: Normal breath sounds.  Musculoskeletal:        General: Normal range of motion.  Skin:    General: Skin is warm and dry.  Neurological:     Mental Status: She is alert and oriented to person, place, and time.      Assessment & Plan:  Hallucinations, visual -     QUEtiapine  Fumarate; Take 1.5 tablets (75 mg total) by mouth daily.  Dispense: 135 tablet; Refill: 0 -     CBC with Differential/Platelet -     CMP14+EGFR  Essential hypertension -     Metoprolol  Succinate ER; TAKE 1 TABLET DAILY WITH OR IMMEDIATELY FOLLOWING A MEAL. **NEEDS TO BE SEEN BEFORE NEXT REFILL**  Dispense: 30 tablet; Refill: 0 -     CBC with Differential/Platelet -     TSH + free T4 -     CMP14+EGFR -     Lipid panel  Atrial fibrillation, unspecified type (HCC) -     Metoprolol  Succinate ER; TAKE 1 TABLET DAILY WITH OR IMMEDIATELY FOLLOWING A MEAL. **NEEDS TO BE SEEN BEFORE NEXT REFILL**  Dispense: 30 tablet; Refill: 0 -     CBC with Differential/Platelet -     CMP14+EGFR  Hypothyroidism, unspecified type -     Levothyroxine  Sodium; Take 1 tablet (50 mcg total) by mouth daily.  Dispense: 90 tablet; Refill: 2 -     CBC with Differential/Platelet -     TSH + free T4 -     CMP14+EGFR -     Lipid panel  Pharyngoesophageal dysphagia -     Famotidine ; Take 1 tablet (20 mg total) by mouth every 12 (twelve) hours.  Dispense: 180 tablet; Refill: 3 -     CBC with  Differential/Platelet -     CMP14+EGFR  Anxiety -     Escitalopram  Oxalate; Take 1 tablet (10 mg total) by mouth daily. **NEEDS TO BE SEEN BEFORE NEXT REFILL**  Dispense: 30 tablet; Refill: 0 -     CBC with Differential/Platelet -     CMP14+EGFR  Other orders -     Triamterene -HCTZ; Take 1 tablet by mouth daily. For blood pressure and fluid  Dispense: 90 tablet; Refill: 3     Follow-up: No follow-ups on file.  Roise Cleaver, M.D.

## 2023-11-14 LAB — CMP14+EGFR
ALT: 18 IU/L (ref 0–32)
AST: 39 IU/L (ref 0–40)
Albumin: 4.2 g/dL (ref 3.7–4.7)
Alkaline Phosphatase: 118 IU/L (ref 44–121)
BUN/Creatinine Ratio: 9 — ABNORMAL LOW (ref 12–28)
BUN: 6 mg/dL — ABNORMAL LOW (ref 8–27)
Bilirubin Total: 0.6 mg/dL (ref 0.0–1.2)
CO2: 26 mmol/L (ref 20–29)
Calcium: 9.5 mg/dL (ref 8.7–10.3)
Chloride: 104 mmol/L (ref 96–106)
Creatinine, Ser: 0.68 mg/dL (ref 0.57–1.00)
Globulin, Total: 2.8 g/dL (ref 1.5–4.5)
Glucose: 90 mg/dL (ref 70–99)
Potassium: 4.7 mmol/L (ref 3.5–5.2)
Sodium: 140 mmol/L (ref 134–144)
Total Protein: 7 g/dL (ref 6.0–8.5)
eGFR: 84 mL/min/{1.73_m2} (ref >59)

## 2023-11-14 LAB — CBC WITH DIFFERENTIAL/PLATELET
Basophils Absolute: 0 10*3/uL (ref 0.0–0.2)
Basos: 0 %
EOS (ABSOLUTE): 0.2 10*3/uL (ref 0.0–0.4)
Eos: 4 %
Hematocrit: 39 % (ref 34.0–46.6)
Hemoglobin: 12.8 g/dL (ref 11.1–15.9)
Immature Grans (Abs): 0 10*3/uL (ref 0.0–0.1)
Immature Granulocytes: 0 %
Lymphocytes Absolute: 1.5 10*3/uL (ref 0.7–3.1)
Lymphs: 29 %
MCH: 31.8 pg (ref 26.6–33.0)
MCHC: 32.8 g/dL (ref 31.5–35.7)
MCV: 97 fL (ref 79–97)
Monocytes Absolute: 0.5 10*3/uL (ref 0.1–0.9)
Monocytes: 10 %
Neutrophils Absolute: 3 10*3/uL (ref 1.4–7.0)
Neutrophils: 57 %
Platelets: 222 10*3/uL (ref 150–450)
RBC: 4.02 x10E6/uL (ref 3.77–5.28)
RDW: 12.7 % (ref 11.7–15.4)
WBC: 5.3 10*3/uL (ref 3.4–10.8)

## 2023-11-14 LAB — TSH+FREE T4
Free T4: 1.23 ng/dL (ref 0.82–1.77)
TSH: 3.37 u[IU]/mL (ref 0.450–4.500)

## 2023-11-14 LAB — LIPID PANEL
Cholesterol, Total: 184 mg/dL (ref 100–199)
HDL: 70 mg/dL (ref >39)
LDL CALC COMMENT:: 2.6 ratio (ref 0.0–4.4)
LDL Chol Calc (NIH): 101 mg/dL — ABNORMAL HIGH (ref 0–99)
Triglycerides: 72 mg/dL (ref 0–149)
VLDL Cholesterol Cal: 13 mg/dL (ref 5–40)

## 2023-11-16 ENCOUNTER — Other Ambulatory Visit: Payer: Self-pay | Admitting: Family Medicine

## 2023-11-16 DIAGNOSIS — F419 Anxiety disorder, unspecified: Secondary | ICD-10-CM

## 2023-11-16 DIAGNOSIS — R441 Visual hallucinations: Secondary | ICD-10-CM

## 2023-11-17 NOTE — Progress Notes (Signed)
Hello Crystal Browning,  Your lab result is normal and/or stable.Some minor variations that are not significant are commonly marked abnormal, but do not represent any medical problem for you.  Best regards, Mechele Claude, M.D.

## 2023-12-24 ENCOUNTER — Other Ambulatory Visit: Payer: Self-pay | Admitting: Family Medicine

## 2023-12-24 MED ORDER — DONEPEZIL HCL 10 MG PO TABS: 10.0000 mg | ORAL_TABLET | ORAL | 0 refills | Status: DC

## 2023-12-24 NOTE — Telephone Encounter (Signed)
 Copied from CRM [id number]. Topic: Clinical - Medication Refill >> Dec 24, 2023 10:40 AM Alysia Jumbo S wrote: Medication: donepezil (ARICEPT) 10 MG tablet  Has the patient contacted their pharmacy? Yes (Agent: If no, request that the patient contact the pharmacy for the refill. If patient does not wish to contact the pharmacy document the reason why and proceed with request.) (Agent: If yes, when and what did the pharmacy advise?)  This is the patient's preferred pharmacy:  [id number] - [place], [place] - [address] [place] [address] Phone: [phone] Fax: [phone]  Is this the correct pharmacy for this prescription? Yes If no, delete pharmacy and type the correct one.   Has the prescription been filled recently? No  Is the patient out of the medication? Yes  Has the patient been seen for an appointment in the last year OR does the patient have an upcoming appointment? Yes  Can we respond through MyChart? No  Agent: Please be advised that Rx refills may take up to 3 business days. We ask that you follow-up with your pharmacy.

## 2024-02-08 ENCOUNTER — Other Ambulatory Visit: Payer: Self-pay | Admitting: Family Medicine

## 2024-02-08 DIAGNOSIS — I10 Essential (primary) hypertension: Secondary | ICD-10-CM

## 2024-02-08 DIAGNOSIS — I4891 Unspecified atrial fibrillation: Secondary | ICD-10-CM

## 2024-03-31 ENCOUNTER — Other Ambulatory Visit: Payer: Self-pay | Admitting: Family Medicine

## 2024-03-31 DIAGNOSIS — F419 Anxiety disorder, unspecified: Secondary | ICD-10-CM

## 2024-04-10 ENCOUNTER — Telehealth: Payer: Self-pay | Admitting: Family Medicine

## 2024-04-10 NOTE — Telephone Encounter (Signed)
 Patient has appt 9-29 for labs and needs orders. Stacks patient.

## 2024-04-10 NOTE — Telephone Encounter (Signed)
 Pt needs OV not lab. Mitzi has tried calling pt. They pick up, dont say anything then hangs up. Mitzi will keep trying to get in touch with them.

## 2024-04-13 ENCOUNTER — Ambulatory Visit: Payer: Self-pay

## 2024-04-13 ENCOUNTER — Other Ambulatory Visit

## 2024-04-13 DIAGNOSIS — R609 Edema, unspecified: Secondary | ICD-10-CM | POA: Diagnosis not present

## 2024-04-13 DIAGNOSIS — W19XXXA Unspecified fall, initial encounter: Secondary | ICD-10-CM | POA: Diagnosis not present

## 2024-04-13 DIAGNOSIS — S0990XA Unspecified injury of head, initial encounter: Secondary | ICD-10-CM | POA: Diagnosis not present

## 2024-04-13 DIAGNOSIS — M542 Cervicalgia: Secondary | ICD-10-CM | POA: Diagnosis not present

## 2024-04-13 NOTE — Telephone Encounter (Signed)
 Daughter called to reports three falls in the last two days. Patient does have a knot to the back of her head from a fall recently-daughter is unsure of when the fall happened that the knot occurred. Patient is on blood thinners. Unsure if there was any positive LOC. Endorses pain to head and heck along with bruising to right arm. Patient recommended to the ED for evaluation. Daughter verbalized understanding and all questions answered.   FYI Only or Action Required?: FYI only for provider.  Patient was last seen in primary care on 11/13/2023 by Zollie Lowers, MD.  Called Nurse Triage reporting Fall.  Symptoms began fall on 9/27 and 2 falls on 9/28.  Interventions attempted: Rest, hydration, or home remedies.  Symptoms are: unchanged.  Triage Disposition: Go to ED Now (Notify PCP)  Patient/caregiver understands and will follow disposition?: Yes  Copied from CRM [id number]. Topic: Clinical - Red Word Triage >> Apr 13, 2024  1:17 PM Larissa RAMAN wrote: [facility] that prompted transfer to Nurse Triage: [place]  twice on 9/28 and once on 9/27, rt arm bruising, neck pain, head pain   ----------------------------------------------------------------------- From previous Reason for Contact - Scheduling: Patient/patient representative is calling to schedule an appointment. Refer to attachments for appointment information. Reason for Disposition  Injury (or injuries) that need emergency care  Answer Assessment - Initial Assessment Questions 1. MECHANISM: How did the fall happen?     Daughter reports patient will slide out of the chair and end up on the ground. Patient fell backwards in her room and hit her head on her dresser.  2. DOMESTIC VIOLENCE AND ELDER ABUSE SCREENING: Did you fall because someone pushed you or tried to hurt you? If Yes, ask: Are you safe now?     no 3. ONSET: When did the fall happen? (e.g., minutes, hours, or days ago)     Fell twice on 9/28 and one on 9/27 4.  LOCATION: What part of the body hit the ground? (e.g., back, buttocks, head, hips, knees, hands, head, stomach)     Patient has hit her right arm with bruising, neck pain and head pain 5. INJURY: Did you hurt (injure) yourself when you fell? If Yes, ask: What did you injure? Tell me more about this? (e.g., body area; type of injury; pain severity)     Knot to the back of head from one of the falls.  6. PAIN: Is there any pain? If Yes, ask: How bad is the pain? (e.g., Scale 0-10; or none, mild,      Mild-moderate 7. SIZE: For cuts, bruises, or swelling, ask: How large is it? (e.g., inches or centimeters)      Bruising to right arm.  9. OTHER SYMPTOMS: Do you have any other symptoms? (e.g., dizziness, fever, weakness; new-onset or worsening).      Possible dizziness 10. CAUSE: What do you think caused the fall (or falling)? (e.g., dizzy spell, tripped)       Possible dizzy spell.  Protocols used: Falls and [facility]

## 2024-04-13 NOTE — Telephone Encounter (Signed)
 FYI noted

## 2024-04-22 ENCOUNTER — Ambulatory Visit (INDEPENDENT_AMBULATORY_CARE_PROVIDER_SITE_OTHER): Admitting: Family Medicine

## 2024-04-22 ENCOUNTER — Encounter: Payer: Self-pay | Admitting: Family Medicine

## 2024-04-22 VITALS — BP 179/68 | HR 50 | Temp 97.8°F | Ht 60.0 in | Wt 102.6 lb

## 2024-04-22 DIAGNOSIS — Z23 Encounter for immunization: Secondary | ICD-10-CM

## 2024-04-22 DIAGNOSIS — I10 Essential (primary) hypertension: Secondary | ICD-10-CM | POA: Diagnosis not present

## 2024-04-22 DIAGNOSIS — R441 Visual hallucinations: Secondary | ICD-10-CM

## 2024-04-22 DIAGNOSIS — G8929 Other chronic pain: Secondary | ICD-10-CM | POA: Diagnosis not present

## 2024-04-22 DIAGNOSIS — M545 Low back pain, unspecified: Secondary | ICD-10-CM

## 2024-04-22 DIAGNOSIS — E039 Hypothyroidism, unspecified: Secondary | ICD-10-CM

## 2024-04-22 MED ORDER — PREGABALIN 100 MG PO CAPS: 100.0000 mg | ORAL_CAPSULE | Freq: Every day | ORAL | 1 refills | Status: DC

## 2024-04-22 MED ORDER — QUETIAPINE FUMARATE 50 MG PO TABS: 50.0000 mg | ORAL_TABLET | Freq: Every day | ORAL | 1 refills | Status: AC

## 2024-04-22 NOTE — Progress Notes (Signed)
 Subjective:  Patient ID: Crystal Browning, female    DOB: 1934-05-06  Age: 89 y.o. MRN: [id number]  CC: Medical Management of Chronic Issues (6 month follow up)   HPI  Discussed the use of AI scribe software for clinical note transcription with the patient, who gave verbal consent to proceed.  History of Present Illness Crystal Browning is an 89 year old female who presents with back pain after recent falls. She is accompanied by her daughter.  She experiences significant back pain, particularly in the lower back, described as severe, especially in the mornings. The pain is accompanied by neck pain and a noticeable 'hump' in her back. She reports difficulty straightening up and walking upright. She has a history of falling, having fallen twice recently, including once out of bed while sleeping, which she believes contributes to her back issues.  She is currently taking Lyrica  (pregabalin ) 75 mg at bedtime, which aids her sleep, though her daughter is concerned about excessive drowsiness. She also takes quetiapine , which may contribute to her drowsiness, and metformin for diabetes management.  Her blood pressure readings at home have been variable, sometimes reaching 142/90 mmHg. Her daughter monitors her blood pressure regularly, checking it three times a day.  She experiences urinary incontinence, necessitating the use of adult diapers. Her daughter applies a cream to alleviate this issue.  She received a flu shot today and has a history of earwax buildup, which previously affected her hearing but is not currently an issue.     follow-up on  thyroid . The patient has a history of hypothyroidism for many years. It has been stable recently. Pt. denies any change in  voice, loss of hair, heat or cold intolerance. Energy level has been adequate to good. Patient denies constipation and diarrhea. No myxedema. Medication is as noted below. Verified that pt is taking it daily on an empty stomach. Well  tolerated.       04/22/2024    2:24 PM 11/13/2023    1:43 PM 09/10/2023   10:57 AM  Depression screen PHQ 2/9  Decreased Interest 0 0 1  Down, Depressed, Hopeless 0 0 0  PHQ - 2 Score 0 0 1  Altered sleeping  2 2  Tired, decreased energy 0 1 2  Change in appetite 0 0 2  Feeling bad or failure about yourself  3 0 0  Trouble concentrating 0 0 0  Moving slowly or fidgety/restless 0 0 0  Suicidal thoughts 0 0 0  PHQ-9 Score  3 7  Difficult doing work/chores Somewhat difficult Not difficult at all     History Alitzel has a past medical history of Arthritis, Atrial fibrillation (HCC) (10/30/2016), Bilateral lower extremity edema (10/30/2016), Cancer (HCC) (03/28/2011), CAP (community acquired pneumonia) (10/04/2019), Dementia (HCC), Depression, Diverticulosis (02/2005), Esophageal stricture (03/03/2018), Generalized abdominal pain (07/17/2015), History of chemotherapy, History of external beam radiation therapy (08/20/2016), Hypertension, Hypothyroid, Osteoporosis, 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   She has a past surgical history that includes Direct laryngoscopy (03/27/2005); Tubal ligation; Total abdominal hysterectomy w/ bilateral salpingoophorectomy (1986); Cataract extraction, bilateral; and Cardioversion (N/A, 11/23/2016).   Her family history includes Alcohol abuse in her brother; COPD in her brother; Cancer (age of onset: 3) in her father; Heart disease (age of onset: 48) in her mother; Stroke in her mother.She reports that she has never smoked. She has never used smokeless tobacco. She reports that she does not drink alcohol and does  not use drugs.    ROS Review of Systems  Objective:  BP (!) 179/68   Pulse (!) 50   Temp 97.8 F (36.6 C)   Ht 5' (1.524 m)   Wt 102 lb 9.6 oz (46.5 kg)   SpO2 97%   BMI 20.04 kg/m   BP Readings from Last 3 Encounters:  04/22/24 (!) 179/68  11/13/23 (!) 158/84   07/01/23 124/60    Wt Readings from Last 3 Encounters:  04/22/24 102 lb 9.6 oz (46.5 kg)  09/10/23 102 lb (46.3 kg)  07/01/23 102 lb (46.3 kg)     Physical Exam Physical Exam GENERAL: Alert, cooperative, well developed, no acute distress. HEENT: Normocephalic, normal oropharynx, moist mucous membranes. CHEST: Clear to auscultation bilaterally, no wheezes, rhonchi, or crackles. CARDIOVASCULAR: Normal heart rate and rhythm, S1 and S2 normal without murmurs. ABDOMEN: Soft, non-tender, non-distended, without organomegaly, normal bowel sounds. EXTREMITIES: No cyanosis or edema. NEUROLOGICAL: Cranial nerves grossly intact, moves all extremities without gross motor or sensory deficit.   Assessment & Plan:  Hypothyroidism, unspecified type -     CBC with Differential/Platelet -     CMP14+EGFR -     TSH + free T4  Encounter for immunization -     Flu vaccine HIGH DOSE PF(Fluzone Trivalent)  Essential hypertension -     CBC with Differential/Platelet -     CMP14+EGFR -     TSH + free T4  Hallucinations, visual -     QUEtiapine  Fumarate; Take 1 tablet (50 mg total) by mouth at bedtime.  Dispense: 90 tablet; Refill: 1  Chronic midline low back pain, unspecified whether sciatica present  Other orders -     Pregabalin ; Take 1 capsule (100 mg total) by mouth at bedtime. For neck and back pain  Dispense: 90 capsule; Refill: 1    Assessment and Plan Assessment & Plan Chronic low back pain with kyphosis   She experiences chronic low back pain associated with kyphosis, making it difficult to straighten up. The pain is severe in the mornings and has persisted for years. Falls may worsen her condition. Her current pregabalin  dosage may be insufficient for pain management. Increase pregabalin  dosage to manage back pain. Apply heat to the affected area after initial ice application for 2-3 days.  Recurrent falls   She has recurrent falls, including falling out of bed while sleeping.  Inconsistent use of a walker increases her fall risk. Bed rails may provide additional safety at home. Encourage consistent use of a walker, especially at night. Consider bed rails for safety at home.  Urinary incontinence   She experiences urinary incontinence requiring the use of adult diapers, which causes discomfort due to rolling. Use the provided cream as directed.  Hypertension   Her blood pressure is variable, with occasional high readings such as 142/90 mmHg. Blood pressure is monitored at home regularly.  General Health Maintenance   She received a flu shot during the visit. Obtain blood work for thyroid  function.       Follow-up: Return in about 6 months (around 10/21/2024), or if symptoms worsen or fail to improve.  Butler Der, M.D.

## 2024-04-23 LAB — CBC WITH DIFFERENTIAL/PLATELET
Basophils Absolute: 0 x10E3/uL (ref 0.0–0.2)
Basos: 0 %
EOS (ABSOLUTE): 0.1 x10E3/uL (ref 0.0–0.4)
Eos: 2 %
Hematocrit: 38.2 % (ref 34.0–46.6)
Hemoglobin: 12.6 g/dL (ref 11.1–15.9)
Immature Grans (Abs): 0 x10E3/uL (ref 0.0–0.1)
Immature Granulocytes: 0 %
Lymphocytes Absolute: 1.2 x10E3/uL (ref 0.7–3.1)
Lymphs: 22 %
MCH: 32.5 pg (ref 26.6–33.0)
MCHC: 33 g/dL (ref 31.5–35.7)
MCV: 99 fL — ABNORMAL HIGH (ref 79–97)
Monocytes Absolute: 0.5 x10E3/uL (ref 0.1–0.9)
Monocytes: 10 %
Neutrophils Absolute: 3.7 x10E3/uL (ref 1.4–7.0)
Neutrophils: 66 %
Platelets: 240 x10E3/uL (ref 150–450)
RBC: 3.88 x10E6/uL (ref 3.77–5.28)
RDW: 12.5 % (ref 11.7–15.4)
WBC: 5.6 x10E3/uL (ref 3.4–10.8)

## 2024-04-23 LAB — CMP14+EGFR
ALT: 15 IU/L (ref 0–32)
AST: 43 IU/L — AB (ref 0–40)
Albumin: 3.9 g/dL (ref 3.7–4.7)
Alkaline Phosphatase: 118 IU/L (ref 48–129)
BUN/Creatinine Ratio: 11 — AB (ref 12–28)
BUN: 10 mg/dL (ref 8–27)
Bilirubin Total: 0.5 mg/dL (ref 0.0–1.2)
CO2: 24 mmol/L (ref 20–29)
Calcium: 9.1 mg/dL (ref 8.7–10.3)
Chloride: 100 mmol/L (ref 96–106)
Creatinine, Ser: 0.93 mg/dL (ref 0.57–1.00)
Globulin, Total: 2.8 g/dL (ref 1.5–4.5)
Glucose: 91 mg/dL (ref 70–99)
Potassium: 5.6 mmol/L — AB (ref 3.5–5.2)
Sodium: 136 mmol/L (ref 134–144)
Total Protein: 6.7 g/dL (ref 6.0–8.5)
eGFR: 59 mL/min/1.73 — AB (ref >59)

## 2024-04-23 LAB — TSH+FREE T4
Free T4: 1.41 ng/dL (ref 0.82–1.77)
TSH: 1.76 u[IU]/mL (ref 0.450–4.500)

## 2024-04-24 ENCOUNTER — Other Ambulatory Visit: Payer: Self-pay | Admitting: Family Medicine

## 2024-04-24 ENCOUNTER — Ambulatory Visit: Payer: Self-pay | Admitting: Family Medicine

## 2024-04-25 ENCOUNTER — Other Ambulatory Visit: Payer: Self-pay | Admitting: Family Medicine

## 2024-04-25 DIAGNOSIS — F419 Anxiety disorder, unspecified: Secondary | ICD-10-CM

## 2024-04-27 ENCOUNTER — Other Ambulatory Visit: Payer: Self-pay | Admitting: Family Medicine

## 2024-05-29 ENCOUNTER — Other Ambulatory Visit: Payer: Self-pay | Admitting: *Deleted

## 2024-05-29 DIAGNOSIS — I4891 Unspecified atrial fibrillation: Secondary | ICD-10-CM

## 2024-05-29 DIAGNOSIS — I10 Essential (primary) hypertension: Secondary | ICD-10-CM

## 2024-07-01 ENCOUNTER — Ambulatory Visit: Attending: Cardiovascular Disease | Admitting: Internal Medicine

## 2024-07-01 VITALS — BP 160/70 | HR 50 | Ht 60.0 in | Wt 104.0 lb

## 2024-07-01 DIAGNOSIS — R600 Localized edema: Secondary | ICD-10-CM | POA: Insufficient documentation

## 2024-07-01 DIAGNOSIS — Z7901 Long term (current) use of anticoagulants: Secondary | ICD-10-CM | POA: Diagnosis present

## 2024-07-01 DIAGNOSIS — I872 Venous insufficiency (chronic) (peripheral): Secondary | ICD-10-CM | POA: Insufficient documentation

## 2024-07-01 DIAGNOSIS — I4821 Permanent atrial fibrillation: Secondary | ICD-10-CM | POA: Insufficient documentation

## 2024-07-01 DIAGNOSIS — I10 Essential (primary) hypertension: Secondary | ICD-10-CM | POA: Insufficient documentation

## 2024-07-01 NOTE — Patient Instructions (Signed)
 Medication Instructions:  Your physician recommends that you continue on your current medications as directed. Please refer to the Current Medication list given to you today.  *If you need a refill on your cardiac medications before your next appointment, please call your pharmacy*  Lab Work: NONE  If you have labs (blood work) drawn today and your tests are completely normal, you will receive your results only by: MyChart Message (if you have MyChart) OR A paper copy in the mail If you have any lab test that is abnormal or we need to change your treatment, we will call you to review the results.  Testing/Procedures: NONE  Follow-Up: At [facility], you and your health needs are our priority.  As part of our continuing mission to provide you with exceptional heart care, our providers are all part of one team.  This team includes your primary Cardiologist (physician) and Advanced Practice Providers or APPs (Physician Assistants and Nurse Practitioners) who all work together to provide you with the care you need, when you need it.  Your next appointment:   1 year(s)  Provider:   Dr. Mona or Orren Fabry, PA-C, Lum Louis, NP, Lamarr Satterfield, NP, or Damien Braver, NP       We recommend signing up for the patient portal called MyChart.  Sign up information is provided on this After Visit Summary.  MyChart is used to connect with patients for Virtual Visits (Telemedicine).  Patients are able to view lab/test results, encounter notes, upcoming appointments, etc.  Non-urgent messages can be sent to your provider as well.   To learn more about what you can do with MyChart, go to [email or web address].

## 2024-07-01 NOTE — Progress Notes (Signed)
 OFFICE CONSULT NOTE  Chief Complaint:  No complaints  Primary Care Physician: Zollie Lowers, MD  HPI:  Crystal Browning is a 89 y.o. female who is being seen today for the evaluation of atrial fibrillation at the request of Zollie Lowers, MD. Jewell was recently seen for symptoms of palpitations and shortness of breath which was mostly noted by her daughter. She said that they routinely go shopping at [facility] and that while walking through the building she became more short of breath easier. It does not seem that [name] typically complaints about any worsening shortness of breath or symptoms but it was clear to her daughter that she was struggling somewhat to get around. She noted that her pulse was irregular and when she was seen in her primary care doctor's office she was found to be in atrial fibrillation. There is no history of A. fib in the family or personal history although she did have possibly a mild MI after the death of her husband, which sounds like it could've been a stress-induced cardiomyopathic event. She was a ready on metoprolol  succinate 50 mg daily and that was increased up to 100 mg daily for rate control and started on Xarelto . Unfortunate she was given the Xarelto  DBT dose pack which is 50 mg twice a day and then switching to 20 mg daily. The appropriate dose for her would've been 20 mg daily from the beginning. So far she has not had any bleeding problems. She is also on low-dose aspirin. There is apparently history of diverticulum.  12/14/2016  Crystal Browning returns today for follow-up of her cardioversion. This is performed by myself on 11/23/2016. She was cardioverted once at 150 J successfully to sinus rhythm. Per her daughter's report she had a partial choking event which was related to x-ray therapy in the past 2 days after her cardioversion. EMS was called and she was noted to be in sinus rhythm at the time. She says she's felt very good since then. She continues to feel  well today. Unfortunately, her EKG today shows she is back in atrial fibrillation although rate controlled in the 70s. Based on this finding I think that she is actually somewhat asymptomatic with the A. fib and therefore I would not recommend pursuing a rhythm control strategy in her. We also discussed anticoagulation. Her daughter's concerned about possibly higher bleeding risk on Xarelto  and is interested in switching her to Eliquis .  06/14/2017  Crystal Browning returns today for follow-up.  She is done well without any recurrent A. fib.  She occasionally gets some dizziness but I do not feel that this is the A. fib.  EKG shows sinus rhythm today.  She denies any bleeding problems on Eliquis .  Recently labs were done in August by her primary care provider which showed normal renal function and stable hemoglobin and hematocrit.  Blood pressure is at goal today.  06/24/2018  Crystal Browning is seen today in follow-up.  She seems to be back in A. fib today.  Apparently recently she was seen in her PCPs office and was in sinus rhythm.  This suggests her A. fib is paroxysmal.  She is tolerating Eliquis  without any bleeding problems.  Her blood pressure is noted to be high today at 180/84 however recheck came down to 145 systolic.  Her PCP has noted labile blood pressures in the past and apparently she was placed on some additional blood pressure medicine which bottomed her out according to her daughter.  Labs from June 11, 2018 showed total cholesterol 166, HDL 41, LDL 97 and triglycerides 141.  07/01/2019  Crystal Browning returns today in follow-up.  She is accompanied by her daughter.  Blood pressure is again elevated in the office today however at home she is certain it runs around 130 systolic.  She denies any chest pain or worsening shortness of breath.  She is back in A. fib today with controlled ventricular response at 74, but is asymptomatic with this.  She is anticoagulated on Eliquis  without bleeding  issues.  06/20/2020  Crystal Browning is seen today in follow-up. Her daughter is with her again today. Her PCP apparently started her back on some low-dose amlodipine . Her daughter was concerned because she had side effects with this including hives and has had recent issues with memory that seem to be worse. There is a suggestion of labile hypertension and at times her blood pressure was low at home. Today the blood pressure is high in the office at 174/68. She is in A. fib today but rate controlled. No difficulties with anticoagulation has been reported. She denies any falls. She had labs in September showing total cholesterol 166, HDL 54, LDL 89 and triglycerides 131.  05/31/2022  Crystal Browning is seen today in follow-up with her daughter.  She continues to do well.  Her home blood pressure readings generally run in the 130 systolic range.  Her office readings however are usually quite elevated.  Her PCP has gotten readings in the 200s.  Her systolic blood pressure today was 196.  The home cuff appears to be calibrated and has been used by other family members which seem to correlate with office readings.  I think she has some significant whitecoat hypertension.  She has not had any recent falls.  She is in A-fib which is persistent.  She has been on lower dose Eliquis  however her weight is remained below 130 pounds which is less than 60 kg.  Based on this and her age over 65, she will qualify for Eliquis  2.5 mg twice daily.  07/01/2023  Crystal Browning is seen today in follow-up with her daughter.  She continues to do well.  Unfortunately she has had some progressive dementia.  She is in A-fib but she is rate controlled.  She is on low-dose Eliquis  which is appropriate for her age over 7 and weight less than 60 kg.  She has had no bleeding issues.  She has had no falls recently.  She has had some lower extremity edema which is attributable to her venous insufficiency and neuropathy.  She had an echo number years  back in 2018 which showed normal LVEF.  She has not had any worsening shortness of breath or chest pain.  07/01/2024  Crystal Browning is seen today for follow-up.  She seems to be doing fairly well.  She denies any worsening swelling.  Weight has been fairly stable.  Her appetite apparently is not great but she does eat.  She has had some progressive dementia with some nocturnal confusion but is on some Seroquel  for this.  She is also had some issues with excoriation, possibly neurotic.  She has a number of areas of scratches on both arms and bandaging.  EKG shows a slow A-fib today.  She denies any significant shortness of breath except with marked exertion.  No recent falls or concerns about bleeding on Eliquis .  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 Tona -2006   History of external beam radiation therapy 08/20/2016   Hypertension    Hypothyroid    Osteoporosis    History of Osteoporosis with one year of Actonel only   Other and combined forms of senile cataract 09/03/2013   Radiation    S/P radiation thrapy -06/2005   TIA (transient ischemic attack)    History of TIA's with no residual effect    Past Surgical History:  Procedure Laterality Date   CARDIOVERSION N/A 11/23/2016   Procedure: CARDIOVERSION;  Surgeon: Mona Vinie BROCKS, MD;  Location: [facility];  Service: Cardiovascular;  Laterality: N/A;    CATARACT EXTRACTION, BILATERAL     DIRECT LARYNGOSCOPY  03/27/2005   S/P direct laryngoscopy, esophagoscopy and biopsy with Dr. Jesus   TOTAL ABDOMINAL HYSTERECTOMY W/ BILATERAL SALPINGOOPHORECTOMY  1986   TUBAL LIGATION      FAMHx:  Family History  Problem Relation Age of Onset   Stroke Mother    Heart disease Mother 47   Cancer Father 24       Prostate   Alcohol abuse Brother    COPD Brother     SOCHx:   reports that she has never smoked. She has never used smokeless tobacco. She reports that she does not drink alcohol and does not use drugs.  ALLERGIES:  Allergies  Allergen Reactions   Amlodipine  Besylate Hives    Hives and confusion.   Tuberculin Tests Swelling   Nifedipine  Swelling   Claritin [Loratadine] Rash    Pt's daughter states she is allergic to claritin   Paroxetine Hcl Rash   Vit D-Vit E-Safflower Oil     ROS: Pertinent items noted in HPI and remainder of comprehensive ROS otherwise negative.  HOME MEDS: Current Outpatient Medications on File Prior to Visit  Medication Sig Dispense Refill   acetaminophen  (TYLENOL ) 325 MG tablet Take 2 tablets (650 mg total) by mouth every 6 (six) hours as needed for mild pain, fever or headache. 12 tablet 0   apixaban  (ELIQUIS ) 2.5 MG TABS tablet Take 1 tablet (2.5 mg total) by mouth 2 (two) times daily. 180 tablet 3   donepezil  (ARICEPT ) 10 MG tablet TAKE 1 TABLET BY MOUTH EVERY OTHER DAY 45 tablet 1   escitalopram  (LEXAPRO ) 10 MG tablet Take 1 tablet (10 mg total) by mouth daily. 90 tablet 0   estradiol  (ESTRACE  VAGINAL) 0.1 MG/GM vaginal cream Place 1 Applicatorful vaginally at bedtime. 42.5 g 12   famotidine  (PEPCID ) 20 MG tablet Take 1 tablet (20 mg total) by mouth every 12 (twelve) hours. 180 tablet 3   levothyroxine  (SYNTHROID ) 50 MCG tablet Take 1 tablet (50 mcg total) by mouth daily. 90 tablet 2   lidocaine  (LIDODERM ) 5 % Place 1 patch onto the skin daily. Remove after 12 hours and leave it off 12 hours before  applying a new one. 30 patch 2   metoprolol  succinate (TOPROL -XL) 100 MG 24 hr  tablet TAKE 1 TABLET DAILY WITH OR IMMEDIATELY FOLLOWING A MEAL. 90 tablet 1   nystatin  cream (MYCOSTATIN ) Apply 1 Application topically 2 (two) times daily. 30 g 0   OVER THE COUNTER MEDICATION Vitamin b 12     QUEtiapine  (SEROQUEL ) 50 MG tablet Take 1 tablet (50 mg total) by mouth at bedtime. 90 tablet 1   vitamin B-12 (CYANOCOBALAMIN ) 1000 MCG tablet Take 1,000 mcg by mouth daily.     Zinc  Oxide 10 % OINT Apply to skin as needed. 226.8 g 1   No current facility-administered medications on file prior to visit.    LABS/IMAGING: No results found for this or any previous visit (from the past 48 hours). No results found.  WEIGHTS: Wt Readings from Last 3 Encounters:  07/01/24 104 lb (47.2 kg)  04/22/24 102 lb 9.6 oz (46.5 kg)  09/10/23 102 lb (46.3 kg)    VITALS: BP (!) 160/70 (BP Location: Left Arm, Patient Position: Sitting, Cuff Size: Normal)   Pulse (!) 50   Ht 5' (1.524 m)   Wt 104 lb (47.2 kg)   SpO2 96%   BMI 20.31 kg/m   EXAM: General appearance: alert and no distress Neck: no carotid bruit and no JVD Lungs: clear to auscultation bilaterally Heart: regular rate and rhythm, S1, S2 normal, no murmur, click, rub or gallop Abdomen: soft, non-tender; bowel sounds normal; no masses,  no organomegaly Extremities: extremities normal, atraumatic, no cyanosis or edema, edema 1+ bilateral lower extremity, and varicose veins noted Pulses: 2+ and symmetric Skin: Skin color, texture, turgor normal. No rashes or lesions Neurologic: Grossly normal Psych: Pleasant  EKG: EKG Interpretation Date/Time:  Wednesday July 01 2024 09:21:02 EST Ventricular Rate:  56 PR Interval:    QRS Duration:  94 QT Interval:  450 QTC Calculation: 434 R Axis:   105  Text Interpretation: Atrial fibrillation with slow ventricular response Septal infarct (cited on or before 01-Jul-2024) Lateral infarct , age  undetermined When compared with ECG of 01-Jul-2023 13:38, Incomplete right bundle branch block is no longer Present Lateral infarct is now Present Questionable change in initial forces of Septal leads Confirmed by Mona Kent [phone]) on 07/01/2024 9:39:00 AM    ASSESSMENT: Permanent atrial fibrillation CHADSVASC score of 4-on Eliquis  Hypertension Venous insufficiency  PLAN: 1.   Crystal Browning has permanent atrial fibrillation, today with slow ventricular response.  She is asymptomatic with this.  She is on Eliquis  without bleeding issues or falls.  Blood pressure is well-controlled at home although was elevated somewhat today.  She does have venous insufficiency with lower extremity edema but that seems to be well-controlled today.  No changes in her medicines.  Follow-up annually or sooner as necessary.  Kent KYM Mona, MD, [facility], FNLA, FACP  [place]  [facility]  Medical Director of the [facility]  [facility] Diplomate of the American Board of Clinical Lipidology Attending Cardiologist  Direct Dial: [phone]  Fax: [phone]  Website:  [email or web address] Kent BROCKS Jushua Waltman 07/01/2024, 9:55 AM

## 2024-07-23 ENCOUNTER — Other Ambulatory Visit: Payer: Self-pay | Admitting: Family Medicine

## 2024-07-23 DIAGNOSIS — F419 Anxiety disorder, unspecified: Secondary | ICD-10-CM

## 2024-08-04 ENCOUNTER — Ambulatory Visit: Payer: Self-pay

## 2024-08-04 NOTE — Telephone Encounter (Signed)
 Left message on vm to schedule pt tomorrow with DOD stacks to review. LS

## 2024-08-04 NOTE — Telephone Encounter (Signed)
 Ems called - fyi

## 2024-08-04 NOTE — Telephone Encounter (Signed)
 Patient's daughter Yetta reports EMS advised that pt did not need to go to the ED and pt also refused. States that since, pt has eaten and was able to get up and out of bed to ambulate. States that starting today, someone will be staying with the patient as she lives at home.   Daughter is asking for further advise. This RN was unable to schedule an OV d/t earlier ED dispo by another triage RN. Advised to call EMS if pt worsens and advocate for transport to the ED.   Please call daughter Yetta at: [phone]. The other numbers listed are for her sisters.  Reason for Triage: Daughter called and stated that patient was checked out by EMS and everything looked good. She's feeling better, she did not got to ER. She wanted to let nurse know that the ambulance was called.  Call back # [phone]

## 2024-08-04 NOTE — Telephone Encounter (Signed)
 Pt bedridden, weak, not eating over the course of the last month (per daughter of pt). EMS called.   FYI Only or Action Required?: FYI only for provider: EMS called for pt.  Patient was last seen in primary care on 04/22/2024 by Zollie Lowers, MD.  Called Nurse Triage reporting Fatigue.  Symptoms began about a month ago.  Interventions attempted: Nothing.  Symptoms are: gradually worsening.  Triage Disposition: Call EMS 911 Now  Patient/caregiver understands and will follow disposition?: Yes  Pt has stopped eating lays in the bed and sleeps a lot loss a lot of weight down to 100lbs, she's so weak.  Reason for Disposition  [1] SEVERE weakness (e.g., unable to walk or barely able to walk, requires support) AND [2] new-onset or getting worse  Answer Assessment - Initial Assessment Questions 1. DESCRIPTION: Describe how you are feeling.     Weakness, bedridden, not eating, not walking  2. SEVERITY: How bad is it?  Can you stand and walk?     Per daughter of pt Crystal Browning she is bedridden and don't get up much and walk  3. ONSET: When did these symptoms begin? (e.g., hours, days, weeks, months)     Approx a month  4. CAUSE: What do you think is causing the weakness or fatigue? (e.g., not drinking enough fluids, medical problem, trouble sleeping)     She's 89 years old, I think it's that time.   5. NEW MEDICINES:  Have you started on any new medicines recently? (e.g., opioid pain medicines, benzodiazepines, muscle relaxants, antidepressants, antihistamines, neuroleptics, beta blockers)     Denies  Protocols used: Weakness (Generalized) and Fatigue-A-AH

## 2024-08-04 NOTE — Telephone Encounter (Signed)
 Call and schedule pt. For Wed 21.

## 2024-08-09 ENCOUNTER — Other Ambulatory Visit: Payer: Self-pay | Admitting: Family Medicine

## 2024-09-10 ENCOUNTER — Ambulatory Visit: Payer: Self-pay

## 2024-10-08 ENCOUNTER — Ambulatory Visit

## 2024-10-21 ENCOUNTER — Ambulatory Visit: Payer: Self-pay | Admitting: Family Medicine
